# Patient Record
Sex: Male | Born: 1940
Health system: Southern US, Community
[De-identification: ages and names within clinical notes are randomized; demographics above are authoritative.]

## PROBLEM LIST (undated history)

## (undated) DIAGNOSIS — I4891 Unspecified atrial fibrillation: Secondary | ICD-10-CM

## (undated) DIAGNOSIS — E785 Hyperlipidemia, unspecified: Secondary | ICD-10-CM

## (undated) DIAGNOSIS — I509 Heart failure, unspecified: Secondary | ICD-10-CM

## (undated) DIAGNOSIS — I219 Acute myocardial infarction, unspecified: Secondary | ICD-10-CM

## (undated) DIAGNOSIS — E039 Hypothyroidism, unspecified: Secondary | ICD-10-CM

## (undated) DIAGNOSIS — I10 Essential (primary) hypertension: Secondary | ICD-10-CM

## (undated) DIAGNOSIS — I2510 Atherosclerotic heart disease of native coronary artery without angina pectoris: Secondary | ICD-10-CM

## (undated) DIAGNOSIS — C801 Malignant (primary) neoplasm, unspecified: Secondary | ICD-10-CM

## (undated) DIAGNOSIS — E6609 Other obesity due to excess calories: Secondary | ICD-10-CM

## (undated) DIAGNOSIS — I252 Old myocardial infarction: Secondary | ICD-10-CM

## (undated) DIAGNOSIS — R001 Bradycardia, unspecified: Secondary | ICD-10-CM

## (undated) HISTORY — DX: Bradycardia, unspecified: R00.1

## (undated) HISTORY — DX: Hypothyroidism, unspecified: E03.9

## (undated) HISTORY — DX: Atherosclerotic heart disease of native coronary artery without angina pectoris: I25.10

## (undated) HISTORY — DX: Old myocardial infarction: I25.2

## (undated) HISTORY — DX: Heart failure, unspecified: I50.9

## (undated) HISTORY — DX: Other obesity due to excess calories: E66.09

## (undated) HISTORY — DX: Unspecified atrial fibrillation: I48.91

## (undated) HISTORY — DX: Hyperlipidemia, unspecified: E78.5

## (undated) HISTORY — DX: Malignant (primary) neoplasm, unspecified: C80.1

## (undated) HISTORY — PX: ANGIOPLASTY: SHX39

## (undated) HISTORY — DX: Acute myocardial infarction, unspecified: I21.9

## (undated) HISTORY — PX: CARDIAC DEFIBRILLATOR PLACEMENT: SHX171

## (undated) HISTORY — DX: Essential (primary) hypertension: I10

---

## 1993-09-05 DIAGNOSIS — I252 Old myocardial infarction: Secondary | ICD-10-CM

## 1993-09-05 HISTORY — DX: Old myocardial infarction: I25.2

## 1994-03-13 HISTORY — PX: CARDIAC CATHETERIZATION: SHX172

## 2002-06-19 ENCOUNTER — Encounter: Admission: RE | Admit: 2002-06-19 | Discharge: 2002-09-17 | Payer: Self-pay | Admitting: Family Medicine

## 2002-12-03 ENCOUNTER — Encounter: Admission: RE | Admit: 2002-12-03 | Discharge: 2003-03-03 | Payer: Self-pay | Admitting: Family Medicine

## 2004-07-27 ENCOUNTER — Emergency Department (HOSPITAL_COMMUNITY): Admission: EM | Admit: 2004-07-27 | Discharge: 2004-07-27 | Payer: Self-pay | Admitting: Emergency Medicine

## 2004-07-30 ENCOUNTER — Ambulatory Visit (HOSPITAL_COMMUNITY): Admission: RE | Admit: 2004-07-30 | Discharge: 2004-07-30 | Payer: Self-pay | Admitting: Orthopedic Surgery

## 2004-09-17 ENCOUNTER — Observation Stay (HOSPITAL_COMMUNITY): Admission: RE | Admit: 2004-09-17 | Discharge: 2004-09-18 | Payer: Self-pay | Admitting: Orthopedic Surgery

## 2004-09-17 ENCOUNTER — Encounter (INDEPENDENT_AMBULATORY_CARE_PROVIDER_SITE_OTHER): Payer: Self-pay | Admitting: Specialist

## 2004-10-07 HISTORY — PX: US ECHOCARDIOGRAPHY: HXRAD669

## 2008-03-11 HISTORY — PX: CARDIOVASCULAR STRESS TEST: SHX262

## 2008-04-25 ENCOUNTER — Ambulatory Visit: Payer: Self-pay | Admitting: Internal Medicine

## 2008-05-08 ENCOUNTER — Ambulatory Visit: Payer: Self-pay | Admitting: Internal Medicine

## 2008-05-08 LAB — CONVERTED CEMR LAB
BUN: 22 mg/dL (ref 6–23)
Basophils Absolute: 0.1 10*3/uL (ref 0.0–0.1)
Basophils Relative: 0.9 % (ref 0.0–3.0)
CO2: 26 meq/L (ref 19–32)
Calcium: 9.1 mg/dL (ref 8.4–10.5)
Chloride: 101 meq/L (ref 96–112)
Creatinine, Ser: 1.2 mg/dL (ref 0.4–1.5)
Eosinophils Absolute: 0.2 10*3/uL (ref 0.0–0.7)
Eosinophils Relative: 3.6 % (ref 0.0–5.0)
GFR calc Af Amer: 77 mL/min
GFR calc non Af Amer: 64 mL/min
Glucose, Bld: 334 mg/dL — ABNORMAL HIGH (ref 70–99)
HCT: 46 % (ref 39.0–52.0)
Hemoglobin: 15.9 g/dL (ref 13.0–17.0)
INR: 1 (ref 0.8–1.0)
Lymphocytes Relative: 20.2 % (ref 12.0–46.0)
MCHC: 34.6 g/dL (ref 30.0–36.0)
MCV: 102.4 fL — ABNORMAL HIGH (ref 78.0–100.0)
Monocytes Absolute: 0.8 10*3/uL (ref 0.1–1.0)
Monocytes Relative: 13.7 % — ABNORMAL HIGH (ref 3.0–12.0)
Neutro Abs: 3.6 10*3/uL (ref 1.4–7.7)
Neutrophils Relative %: 61.6 % (ref 43.0–77.0)
Platelets: 185 10*3/uL (ref 150–400)
Potassium: 4.7 meq/L (ref 3.5–5.1)
Prothrombin Time: 12.3 s (ref 10.9–13.3)
RBC: 4.49 M/uL (ref 4.22–5.81)
RDW: 13.7 % (ref 11.5–14.6)
Sodium: 136 meq/L (ref 135–145)
WBC: 5.9 10*3/uL (ref 4.5–10.5)
aPTT: 28.5 s (ref 21.7–29.8)

## 2008-05-13 ENCOUNTER — Ambulatory Visit: Payer: Self-pay | Admitting: Internal Medicine

## 2008-05-13 ENCOUNTER — Observation Stay (HOSPITAL_COMMUNITY): Admission: RE | Admit: 2008-05-13 | Discharge: 2008-05-14 | Payer: Self-pay | Admitting: Internal Medicine

## 2008-05-28 ENCOUNTER — Ambulatory Visit: Payer: Self-pay

## 2008-08-19 ENCOUNTER — Ambulatory Visit: Payer: Self-pay | Admitting: Internal Medicine

## 2008-10-15 ENCOUNTER — Encounter: Payer: Self-pay | Admitting: Internal Medicine

## 2008-11-18 ENCOUNTER — Ambulatory Visit: Payer: Self-pay | Admitting: Internal Medicine

## 2008-12-23 ENCOUNTER — Ambulatory Visit: Payer: Self-pay | Admitting: Internal Medicine

## 2009-01-13 ENCOUNTER — Encounter: Payer: Self-pay | Admitting: Internal Medicine

## 2009-01-20 ENCOUNTER — Ambulatory Visit: Payer: Self-pay | Admitting: Internal Medicine

## 2009-02-20 ENCOUNTER — Ambulatory Visit: Payer: Self-pay | Admitting: Internal Medicine

## 2009-05-16 DIAGNOSIS — I495 Sick sinus syndrome: Secondary | ICD-10-CM | POA: Insufficient documentation

## 2009-05-16 DIAGNOSIS — Z95810 Presence of automatic (implantable) cardiac defibrillator: Secondary | ICD-10-CM | POA: Insufficient documentation

## 2009-05-16 DIAGNOSIS — I255 Ischemic cardiomyopathy: Secondary | ICD-10-CM | POA: Insufficient documentation

## 2009-05-16 DIAGNOSIS — I5022 Chronic systolic (congestive) heart failure: Secondary | ICD-10-CM | POA: Insufficient documentation

## 2009-06-19 ENCOUNTER — Encounter: Admission: RE | Admit: 2009-06-19 | Discharge: 2009-06-19 | Payer: Self-pay | Admitting: Family Medicine

## 2009-08-11 ENCOUNTER — Ambulatory Visit: Payer: Self-pay | Admitting: Internal Medicine

## 2009-11-10 ENCOUNTER — Ambulatory Visit: Payer: Self-pay | Admitting: Internal Medicine

## 2009-11-17 ENCOUNTER — Encounter: Payer: Self-pay | Admitting: Internal Medicine

## 2010-02-09 ENCOUNTER — Ambulatory Visit: Payer: Self-pay | Admitting: Internal Medicine

## 2010-02-09 ENCOUNTER — Encounter: Payer: Self-pay | Admitting: Internal Medicine

## 2010-02-23 ENCOUNTER — Encounter: Payer: Self-pay | Admitting: Internal Medicine

## 2010-03-02 ENCOUNTER — Encounter: Payer: Self-pay | Admitting: Internal Medicine

## 2010-04-26 ENCOUNTER — Ambulatory Visit: Payer: Self-pay | Admitting: Cardiology

## 2010-04-26 ENCOUNTER — Ambulatory Visit (HOSPITAL_COMMUNITY): Admission: RE | Admit: 2010-04-26 | Discharge: 2010-04-26 | Payer: Self-pay | Admitting: Cardiology

## 2010-04-26 HISTORY — PX: US ECHOCARDIOGRAPHY: HXRAD669

## 2010-05-13 ENCOUNTER — Ambulatory Visit: Payer: Self-pay | Admitting: Internal Medicine

## 2010-05-24 ENCOUNTER — Encounter: Payer: Self-pay | Admitting: Internal Medicine

## 2010-08-19 ENCOUNTER — Ambulatory Visit: Payer: Self-pay | Admitting: Internal Medicine

## 2010-09-14 ENCOUNTER — Encounter (INDEPENDENT_AMBULATORY_CARE_PROVIDER_SITE_OTHER): Payer: Self-pay | Admitting: *Deleted

## 2010-10-07 NOTE — Cardiovascular Report (Signed)
Summary: Office Visit Remote   Office Visit Remote   Imported By: Sallee Provencal 09/24/2010 10:40:00  _____________________________________________________________________  External Attachment:    Type:   Image     Comment:   External Document

## 2010-10-07 NOTE — Letter (Signed)
Summary: Remote Device Check  [place], [place]  [id number] [place]   [name], [place] [address]   Phone: [phone]  Fax: [phone]     February 23, 2010 MRN: [id number]   [facility] [place], [place]  [address]   Dear Mr. Roehr,   Your remote transmission was recieved and reviewed by your physician.  All diagnostics were within normal limits for you.  __X__Your next transmission is scheduled for:  05-13-2010.  Please transmit at any time this day.  If you have a wireless device your transmission will be sent automatically.   Sincerely,  Shelly Bombard

## 2010-10-07 NOTE — Cardiovascular Report (Signed)
Summary: Office Visit Remote   Office Visit Remote   Imported By: Sallee Provencal 03/05/2010 16:39:54  _____________________________________________________________________  External Attachment:    Type:   Image     Comment:   External Document

## 2010-10-07 NOTE — Letter (Signed)
Summary: Remote Device Check  [facility], [place]  [id number] [place]   [place], [place] [address]   Phone: [phone]  Fax: [phone]     March 02, 2010 MRN: [id number]   [facility] [place], [place]  [address]   Dear Mr. Diiorio,   Your remote transmission was recieved and reviewed by your physician.  All diagnostics were within normal limits for you.  __X___Your next transmission is scheduled for:   05-13-2010.  Please transmit at any time this day.  If you have a wireless device your transmission will be sent automatically.   Sincerely,  Shelly Bombard

## 2010-10-07 NOTE — Cardiovascular Report (Signed)
Summary: Office Visit Remote   Office Visit Remote   Imported By: Sallee Provencal 05/25/2010 12:42:53  _____________________________________________________________________  External Attachment:    Type:   Image     Comment:   External Document

## 2010-10-07 NOTE — Letter (Signed)
Summary: Remote Device Check  [facility], [place]  [id number] [place]   [place], [place] [address]   Phone: [phone]  Fax: [phone]     November 17, 2009 MRN: [id number]   [facility] [place], [place]  [address]   Dear Rodney Lee,   Your remote transmission was recieved and reviewed by your physician.  All diagnostics were within normal limits for you.  __X___Your next transmission is scheduled for:     February 09, 2010.  Please transmit at any time this day.  If you have a wireless device your transmission will be sent automatically.      Sincerely,  Hotel manager

## 2010-10-07 NOTE — Letter (Signed)
Summary: Remote Device Check  Rodney Lee, Rodney Lee  [id number] [place]   [place], [place] [address]   Phone: [phone]  Fax: [phone]     May 24, 2010 MRN: [id number]   [facility] [place], [place]  [address]   Dear [name],   Your remote transmission was recieved and reviewed by your physician.  All diagnostics were within normal limits for you.  __X___Your next transmission is scheduled for: 08-19-2010.  Please transmit at any time this day.  If you have a wireless device your transmission will be sent automatically.   Sincerely,  Shelly Bombard

## 2010-10-07 NOTE — Cardiovascular Report (Signed)
Summary: Office Visit Remote   Office Visit Remote   Imported By: Sallee Provencal 11/18/2009 11:37:44  _____________________________________________________________________  External Attachment:    Type:   Image     Comment:   External Document

## 2010-10-07 NOTE — Letter (Signed)
Summary: Remote Device Check  [facility], [place]  [name] [place]   [place], [place] [address]   Phone: [phone]  Fax: [phone]     September 14, 2010 MRN: [id number]   [facility] [place], [place]  [address]   Dear Mr. Krempasky,   Your remote transmission was recieved and reviewed by your physician.  All diagnostics were within normal limits for you.  __X____Your next office visit is scheduled for: March 2012 with Dr Lovena Le. Please call our office to schedule an appointment.    Sincerely,  Shelly Bombard

## 2011-01-08 ENCOUNTER — Other Ambulatory Visit: Payer: Self-pay | Admitting: Cardiology

## 2011-01-08 DIAGNOSIS — E7800 Pure hypercholesterolemia, unspecified: Secondary | ICD-10-CM

## 2011-01-10 NOTE — Telephone Encounter (Signed)
escribe request  

## 2011-01-18 NOTE — Op Note (Signed)
NAME:  Rodney Lee, [name]:  [id number]   MEDICAL RECORD NO.:  [id number]          PATIENT TYPE:  INP   LOCATION:  2899                         FACILITY:  [facility]   PHYSICIAN:  Thompson Grayer, MD       DATE OF BIRTH:  1941-05-29   DATE OF PROCEDURE:  DATE OF DISCHARGE:                               OPERATIVE REPORT   PRIMARY OPERATOR:  Champ Mungo. Lovena Le, MD.   FIRST ASSISTANT:  Thompson Grayer, MD   PREPROCEDURE DIAGNOSES:  1. Ischemic cardiomyopathy.  2. New York Heart Association class II heart failure.  3. Sinus bradycardia.  4. Coronary artery disease.   POSTPROCEDURE DIAGNOSES:  1. Ischemic cardiomyopathy.  2. New York Heart Association class II heart failure.  3. Sinus bradycardia.  4. Coronary artery disease.   PROCEDURES:  1. Dual-chamber implantable cardioverter-defibrillator implantation.  2. Defibrillation threshold testing.   DESCRIPTION OF THE PROCEDURE:  Informed written consent was obtained and  the patient was brought to the electrophysiology lab in a fasting state.  He was adequately sedated with intravenous midazolam and fentanyl as  outlined in the nursing report.  The patient's left chest was prepped  and draped in the usual sterile fashion by the EP lab staff.  A 6-cm  incision was then made over the left deltopectoral region.  An ICD  pocket was fashioned using a combination of blunt and sharp dissection.  The electrocautery was used to maintain hemostasis throughout.  The left  cephalic vein visualized.  A Medtronic Sprint Quattro Secure model [id number] (serial [id number]) right ventricular defibrillator lead was  advanced through this vein with fluoroscopic guidance into the right  ventricular apex position.  The lead was actually fixed to the  myocardium and secured to the pectoralis fascia with #0 silk suture over  the suture sleeve.  The left axillary vein was then accessed using a  modified Seldinger technique and through this vein,  a Medtronic model  [id number] (serial [id number]) right atrial lead was advanced into the  right atrial appendage position.  The lead was actively fixed in this  location and then secured to the pectoralis fascia with #0 silk over the  suture sleeves.  The leads were then connected to a Medtronic Virtuoso  DR model [id number] (serial [id number]) dual chamber ICD.  Initial lead  measurements revealed an atrial lead P-wave of 3.8 mV with an impedance  of 884 ohms and threshold of 1 volt at 0.5 msec.  The right ventricular  lead R-wave measured 10.4 mV with an impedance of 1000 ohms and  threshold of 1 volt at 0.5 msec.  The ICD pocket was then irrigated with  copious gentamicin solution.  The device was placed into the pocket and  secured to the pectoralis fascia with a single 0 silk suture.  The  pocket was then closed with 2.0 Vicryl suture for the subcutaneous and  subcuticular layers.  Steri-Strips and a sterile bandage were then  applied.  DFT testing was then performed.  Ventricular fibrillation was  induced with 2 shocks.  Adequate ventricular fibrillation sensing  was  observed at 1.2 mV sensitivity.  The patient was successfully  defibrillated with a single 15-joule shock delivered through the device  with an impedance of 49 ohms and a time of 8 seconds.  The procedure was  therefore considered completed.  The patient was returned to his room in  good condition.  There were no early apparent complications.   CONCLUSIONS:  1. Successful Medtronic Virtuoso DR ICD implantation into the left      subcutaneous chest wall pocket.  2. DFT less than or equal to 15 joules.  3. No early apparent complications.      Thompson Grayer, MD  Electronically Signed     JA/MEDQ  D:  05/13/2008  T:  05/14/2008  Job:  [id number]   cc:   Darlin Coco, M.D.

## 2011-01-18 NOTE — Letter (Signed)
April 25, 2008    Darlin Coco, MD  [address] [place], [place] [address]   RE:  JOHNCARLOS, JUNGE  MRN:  [id number]  /  DOB:  1941-03-17   Dear Marcello Moores,   Thank you for referring Mr. Andre Lefort for EP evaluation and  consideration for prophylactic ICD insertion.  As you know, he is a very  pleasant 70 year old man with a history of coronary artery disease,  diabetes, and hypertension, who is status post stenting back in 1995  when he presented with an acute myocardial infarction.  The patient has  done well from a heart failure perspective.  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  He denies chest pain and shortness of  breath and he has never had frank syncope.   PAST MEDICAL HISTORY:  Notable for arthritis with back surgery in the  past.  He has a history of diabetes for approximately 8 years.  He has  hypertension for 5 or 10 years.  He has dyslipidemia.   SOCIAL HISTORY:  The patient is married.  He denies tobacco or ethanol  abuse.  He drinks a very little in the way of caffeinated beverages.   MEDICATIONS:  1. Vytorin 10/40 a day.  2. Carvedilol 6.25 twice daily.  3. Synthroid 0.125 daily.  4. Ramipril 20 a day.  5. Aspirin 81 a day.  6. Metformin 500 two tablets twice daily.  7. Hydrochlorothiazide 12.5 daily.  8. Glyburide 1 mg daily.  9. Multiple vitamins.   FAMILY HISTORY:  Notable for mother died of an MI in her 64s and father  of complications of Alzheimer dementia in his 48s.   REVIEW OF SYSTEMS:  Notable for allergic rhinitis, otherwise negative  except as noted in the HPI.   PHYSICAL EXAMINATION:  GENERAL:  He is a pleasant, well-appearing, 70-  year-old man, in no acute distress.  VITAL SIGNS:  Blood pressure today was 107/55, the pulse was 49 and  regular, the respirations were 18, the weight was 230 pounds.  HEENT:  Normocephalic and atraumatic.   Pupils are equal and round.  Oropharynx is moist.  Sclerae are anicteric.  NECK:  No jugular venous distention.  There are no thyromegaly.  Trachea  is midline.  The carotids are 2+ and symmetric.  LUNGS:  Clear bilaterally to auscultation.  No wheezes, rales, or  rhonchi are present.  There is no increased work of breathing.  CARDIOVASCULAR:  Regular rate and rhythm.  Normal S1 and S2.  There are  no murmurs, rubs, or gallops.  PMI was enlarged and laterally displaced.  ABDOMEN:  Soft, nontender.  There is no organomegaly.  The bowel sounds  are present.  There is no rebound or guarding.  EXTREMITIES:  No cyanosis, clubbing, or edema.  The pulses were 2+ and  symmetric.  NEUROLOGIC:  Alert and oriented x3.  His cranial nerves were intact.  Strength was 5/5 and symmetric.   EKG demonstrates a marked sinus bradycardia with extensive anterior  myocardial infarction.   IMPRESSION:  Tom, Mr. Cullinane has ischemic cardiomyopathy with a paucity  of congestive heart failure symptoms.  His heart failure really is class  I.  He also has diabetes, hypertension, and dyslipidemia.  I have  discussed the treatment options with the patient and recommended  prophylactic implantable cardioverter-defibrillator implantation with  the patient, not because it will make  him feel better, but because of  his risk for sudden cardiac death and clear survival advantage with  implantable cardioverter-defibrillator insertion.  I have, however, also  consulted him that maintaining a commercial driver's license and having  a defibrillator in place are not too compatible and if he decides to  proceed with his defibrillator insertion, would not be allowed to re-  certify for his commercial driver's license.  He is considering his  options.  He wants to think  about whether to have this done and will call us if he would to like to  proceed with ICD implantation.   Tom, once again thanks for referring Mr. Monagle for EP  evaluation.    Sincerely,      Champ Mungo. Lovena Le, MD  Electronically Signed    GWT/MedQ  DD: 04/25/2008  DT: 04/26/2008  Job #: [id number]

## 2011-01-18 NOTE — Assessment & Plan Note (Signed)
[facility] OFFICE NOTE   JEDADIAH, YEHL                        MRN:          [id number]  DATE:08/19/2008                            DOB:          18-Feb-1941    [name] returns today for followup.  He is a very pleasant male with a  history of an ischemic cardiomyopathy, congestive heart failure, ICD  implantation who returns today for followup.  He denies chest pain or  shortness of breath.  He says his defibrillator is placed, he has had no  intercurrent IC therapies.   CURRENT MEDICATIONS:  1. Vytorin 10/40.  2. Carvedilol 6.25 twice a day.  3. Aspirin 81 a day.  4. Metformin 500 twice daily.  5. HCTZ 12.5 daily.  6. Lisinopril 10 daily.  7. Synthroid 0.137 daily.  8. Multivitamin.  9. Vitamin C.  10.HCTZ 12.5 daily.   PHYSICAL EXAMINATION:  GENERAL:  He is a pleasant well-appearing middle-  aged man in no distress.  VITAL SIGNS:  The blood pressure today was 138/77, the pulse 60 and  regular, respirations were 18.  Weight was 232 pounds.  NECK:  No jugular venous distention.  LUNGS:  Clear bilaterally to auscultation.  No wheezes, rales, or  rhonchi are present.  No increased work of breathing.  CARDIOVASCULAR:  Regular rate and rhythm.  Normal S1 and S2.  ABDOMINAL:  Soft, nontender.  EXTREMITIES:  No edema.   Interrogation of his defibrillator demonstrates [place].  The  P-waves were 6, the R-waves were 10, the impedance 536 in the A and 648  in the RV, the threshold of 0.5 and 0.4 in the atrium and 1 and 0.4 in  the RV.  Battery voltage was 3.2 volts.  There are no intercurrent ICD  therapies.  He was 45% A paced.   IMPRESSION:  1. Ischemic cardiomyopathy.  2. Congestive heart failure.  3. Sinus bradycardia.  4. Status post dual-chamber ICD insertion.   DISCUSSION:  Overall, Mr. Braude is stable and his defibrillator is  working normally.  He is pacing 45% of the time  secondary to some sinus  node dysfunction on beta-blockers.  I will plan  to see the patient back for ICD followup in 9 months.  I have encouraged  him to maintain a low-sodium diet.     Champ Mungo. Lovena Le, MD  Electronically Signed    GWT/MedQ  DD: 08/19/2008  DT: 08/19/2008  Job #: [id number]   cc:   Darlin Coco, M.D.

## 2011-01-18 NOTE — Discharge Summary (Signed)
NAME:  Rodney Lee, Rodney Lee NO.:  [id number]   MEDICAL RECORD NO.:  [id number]          PATIENT TYPE:  INP   LOCATION:  2035                         FACILITY:  [facility]   PHYSICIAN:  Rodney Mungo. Lovena Le, MD    DATE OF BIRTH:  [birth year]   DATE OF ADMISSION:  05/13/2008  DATE OF DISCHARGE:  05/14/2008                               DISCHARGE SUMMARY   This patient has no known drug allergies and time for this dictation and  examination greater than 30 minutes.   FINAL DIAGNOSIS:  Discharged on day 1, status post implantation of the  Medtronic VIRTUOSO DR dual-chamber cardioverter defibrillator, Rodney Lee.  Defibrillator threshold showing less than or equal to 15  joules.   SECONDARY DIAGNOSES:  1. History of coronary artery disease, stent 1995 at presentation with      acute myocardial infarction.  2. New York Heart Association Class II chronic systolic congestive      heart failure.  3. Hypertension.  4. Diabetes.  5. Dyslipidemia.  6. Recent Myoview study, March 11, 2008.  Ejection fraction 30%,      anteroseptal and apical akinesis with finding of old anteroseptal      and apical scar, no ischemia.   PROCEDURES:  On May 13, 2008, implantation of a dual-chamber  cardioverter defibrillator as mentioned above, Rodney Lee.  The  patient had no postprocedural complications.  No hematoma at the  insertion site.  Chest x-ray shows that the leads are in the appropriate  position and device has been interrogated and all values are within  normal limits.   BRIEF HISTORY:  Rodney Lee is a 70 year old male who is referred by Dr.  Mare Lee for evaluation of cardioverter defibrillator for prophylactic  prevention.   He is a 70 year old male with a history of coronary artery disease,  diabetes, and hypertension.  The patient has done well from a heart  failure perspective.  He is very active, still working part time.  His  New York Heart Association Class is II.   He does not have shortness of  breath or chest pain.  He has never had frank syncope.  The patient had  a recent Myoview study which showed his ejection fraction had not  significantly improved, it was still 30% despite Carvedilol therapy and  ACE-I therapy.  The patient presents for a consideration of cardioverter  defibrillator.   HOSPITAL COURSE:  The patient had the risks and benefits of cardioverter  defibrillator implantation described and wished to proceed.  He is  admitted to [facility] electively on May 13, 2008.  He  underwent implantation of the device the same day.  He is discharging  postprocedure day #1 with no postprocedural complications.  He is asked  to keep his incision dry for the next 7 days and to sponge bathe until  Tuesday, May 20, 2008.  He is asked not to drive for 1 week.  Mobility of the left arm has been discussed with the patient.   MEDICATION AT DISCHARGE:  1. Vytorin 10/20 daily at bedtime.  2. Carvedilol 6.25 mg twice daily.  3. Synthroid 125 mcg daily.  4. Ramipril 20 mg daily.  5. Enteric-coated aspirin 81 mg daily.  6. Metformin 500 mg 2 tablets in the morning and 2 tablets in the      evening.  7. Hydrochlorothiazide 12.5 mg daily.  8. Glimepiride 1 mg daily.  9. Multivitamin daily.   FOLLOWUP:  1. [facility] at 11:26, [employer].  2. [facility], Wednesday, May 28, 2008, at 3:30.  3. He sees Rodney Lee, Tuesday, August 19, 2008, at 9:30.   LABORATORY STUDIES:  Pertinent to this admission withdrawn on May 08, 2008, protime 12.3 and INR is 1.  White cells 5.9, hemoglobin 15.9,  hematocrit 46, and platelets are 185.  Sodium is 136, potassium is 4.7,  chloride 101, carbonate 26, glucose 334, BUN is 22, and creatinine 1.2.      Rodney Margarita, PA      [facility] Lovena Le, MD  Electronically Signed    GM/MEDQ  D:  05/14/2008  T:  05/14/2008  Job:  [id number]   cc:   Rodney Lee,  M.D.  Rodney Lee, M.D.

## 2011-01-21 NOTE — Op Note (Signed)
NAME:  Rodney Lee, Rodney Lee NO.:  [id number]   MEDICAL RECORD NO.:  [id number]          PATIENT TYPE:  AMB   LOCATION:  DAY                          [facility]ACILITY[facility]  [facility]   PHYSICIAN:  Tarri Glenn, M.D.  DATE OF BIRTH:  1941-02-11   DATE OF PROCEDURE:  09/17/2004  DATE OF DISCHARGE:                                 OPERATIVE REPORT   PREOPERATIVE DIAGNOSIS:  Herniated nucleus pulposus at L3-4, left.   POSTOPERATIVE DIAGNOSIS:  Herniated nucleus pulposus at L3-4, left.   OPERATIONS:  Microdiskectomy and foraminotomy of L3-4, left.   SURGEON:  Tarri Glenn, M.D.   ASSISTANT:  Kipp Brood. Gladstone Lighter, M.D.   ANESTHESIA:  General.   JUSTIFICATION FOR PROCEDURE:  He was having severe back and left leg and  thigh pain with numbness in the left thigh.  MRIs demonstrated abnormalities  at multiple levels with the major problem, however, being a foraminal and  extraforaminal disk herniation at L3-4 on the left, which appeared to be  abutting on both the L3-4 and nerve roots.  Because of failure to respond to  nonsurgical treatment, he is here today for surgical intervention.   DESCRIPTION OF PROCEDURE:  Prophylactic antibiotics.  Satisfactory general  anesthesia.  Knee/chest position on the [place] frame.  The back was prepped  with Duraprep.  With three spinal x-rays and a lateral x-ray, I tentatively  located the L3-4 interspace.  We then continued draping the back as a  sterile field.  Based on the initial x-ray, I made a surgical incision and  exposed the two spinous processes, which I anticipate would be L3 and L4.  I  clamped these with Kocher clamps and took a second lateral x-ray indicating  that these were L3 and L4 spinous processes with the L3-4 disk slightly bias  to the superior marker.  Based on this, I dissected soft tissue off of the  lamina of L3 and L4 and placed a self-retaining retractor McCollough  retractor.  With a combination of curets, I dissected  soft tissue off of the  underneath surface of the lamina of L3 and superior of L4 and was able to  get beneath both with 2 mm Kerrison and alternating between 3 mm Kerrison to  remove bone and ligamentum flavum, performing wide foraminotomy for the L4  nerve root and removing enough of the lamina of L3 that we could get to the  interspace.  Also did a wide decompression lateral.  I then brought in the  microscope and completed the decompression and dissection, locating the L4  nerve root and mobilizing it medially.  He had a fairly large venous plexus  adjacent to the disk which I coagulated with bipolar cautery.  The  interspace was open with a 15 knife blade and a large amount of disk  material removed with a combination of Epstein curets and pituitaries with  particular attention to work laterally.  A good bit of disk material was  obtained from the lateral aspect of the interspace.  With a hockey stick I  was then able to place it  in the foramen of the L3 nerve root and found it  to be widely patent as was the foramen for the L4 nerve root.  We then made  sure that all bleeding was corrected with Gelfoam before we closed.  Self-  retaining retractors were removed.  There was no unusual bleeding.  He was  given 300 mg of Toradol IV.  We closed the fascia with interrupted 1-0  Vicryl leaving a little aperture superiorly for any oozing.  We closed  the subcutaneous tissue with 2-0 Vicryl and the skin with staples.  We  infiltrated the subcutaneous tissue with 0.50% plain Marcaine.  Betadine and  Adaptic dressing were applied.  He was gently placed on his bed and taken to  recovery in satisfactory condition with no known complications.      JA/MEDQ  D:  09/17/2004  T:  09/17/2004  Job:  [id number]

## 2011-04-03 ENCOUNTER — Other Ambulatory Visit: Payer: Self-pay | Admitting: Cardiology

## 2011-04-04 NOTE — Telephone Encounter (Signed)
escribe medication per fax request  

## 2011-05-12 ENCOUNTER — Encounter: Payer: Self-pay | Admitting: *Deleted

## 2011-05-23 ENCOUNTER — Other Ambulatory Visit: Payer: Self-pay | Admitting: Internal Medicine

## 2011-05-23 ENCOUNTER — Encounter: Payer: Self-pay | Admitting: Internal Medicine

## 2011-05-23 ENCOUNTER — Ambulatory Visit (INDEPENDENT_AMBULATORY_CARE_PROVIDER_SITE_OTHER): Payer: Self-pay | Admitting: *Deleted

## 2011-05-23 DIAGNOSIS — I428 Other cardiomyopathies: Secondary | ICD-10-CM

## 2011-05-23 DIAGNOSIS — I495 Sick sinus syndrome: Secondary | ICD-10-CM

## 2011-05-23 DIAGNOSIS — I509 Heart failure, unspecified: Secondary | ICD-10-CM

## 2011-05-28 LAB — REMOTE ICD DEVICE
AL AMPLITUDE: 4.8778 mv
AL IMPEDENCE ICD: 424 Ohm
ATRIAL PACING ICD: 66.34 pct
BAMS-0001: 170 {beats}/min
BATTERY VOLTAGE: 3.06 V
CHARGE TIME: 10.169 s
DEV-0020ICD: NEGATIVE
FVT: 0
PACEART VT: 0
RV LEAD AMPLITUDE: 10.3763 mv
RV LEAD IMPEDENCE ICD: 760 Ohm
TOT-0001: 1
TOT-0002: 0
TOT-0006: 20090908000000
TZAT-0001ATACH: 1
TZAT-0001ATACH: 2
TZAT-0001ATACH: 3
TZAT-0001FASTVT: 1
TZAT-0001SLOWVT: 1
TZAT-0001SLOWVT: 2
TZAT-0002ATACH: NEGATIVE
TZAT-0002ATACH: NEGATIVE
TZAT-0002ATACH: NEGATIVE
TZAT-0002FASTVT: NEGATIVE
TZAT-0002SLOWVT: NEGATIVE
TZAT-0002SLOWVT: NEGATIVE
TZAT-0012ATACH: 150 ms
TZAT-0012ATACH: 150 ms
TZAT-0012ATACH: 150 ms
TZAT-0012FASTVT: 200 ms
TZAT-0012SLOWVT: 200 ms
TZAT-0012SLOWVT: 200 ms
TZAT-0018ATACH: NEGATIVE
TZAT-0018ATACH: NEGATIVE
TZAT-0018ATACH: NEGATIVE
TZAT-0018FASTVT: NEGATIVE
TZAT-0018SLOWVT: NEGATIVE
TZAT-0018SLOWVT: NEGATIVE
TZAT-0019ATACH: 6 V
TZAT-0019ATACH: 6 V
TZAT-0019ATACH: 6 V
TZAT-0019FASTVT: 8 V
TZAT-0019SLOWVT: 8 V
TZAT-0019SLOWVT: 8 V
TZAT-0020ATACH: 1.5 ms
TZAT-0020ATACH: 1.5 ms
TZAT-0020ATACH: 1.5 ms
TZAT-0020FASTVT: 1.5 ms
TZAT-0020SLOWVT: 1.5 ms
TZAT-0020SLOWVT: 1.5 ms
TZON-0003ATACH: 350 ms
TZON-0003SLOWVT: 350 ms
TZON-0003VSLOWVT: 390 ms
TZON-0004SLOWVT: 16
TZON-0004VSLOWVT: 20
TZON-0005SLOWVT: 12
TZST-0001ATACH: 4
TZST-0001ATACH: 5
TZST-0001ATACH: 6
TZST-0001FASTVT: 2
TZST-0001FASTVT: 3
TZST-0001FASTVT: 4
TZST-0001FASTVT: 5
TZST-0001FASTVT: 6
TZST-0001SLOWVT: 3
TZST-0001SLOWVT: 4
TZST-0001SLOWVT: 5
TZST-0001SLOWVT: 6
TZST-0002ATACH: NEGATIVE
TZST-0002ATACH: NEGATIVE
TZST-0002ATACH: NEGATIVE
TZST-0002FASTVT: NEGATIVE
TZST-0002FASTVT: NEGATIVE
TZST-0002FASTVT: NEGATIVE
TZST-0002FASTVT: NEGATIVE
TZST-0002FASTVT: NEGATIVE
TZST-0002SLOWVT: NEGATIVE
TZST-0002SLOWVT: NEGATIVE
TZST-0002SLOWVT: NEGATIVE
TZST-0002SLOWVT: NEGATIVE
VENTRICULAR PACING ICD: 0.05 pct
VF: 0

## 2011-06-03 ENCOUNTER — Encounter: Payer: Self-pay | Admitting: Cardiology

## 2011-06-03 NOTE — Progress Notes (Signed)
ICD/ICM checked by remote

## 2011-06-08 ENCOUNTER — Encounter: Payer: Self-pay | Admitting: *Deleted

## 2011-06-08 LAB — GLUCOSE, CAPILLARY
Glucose-Capillary: 158 — ABNORMAL HIGH
Glucose-Capillary: 204 — ABNORMAL HIGH
Glucose-Capillary: 219 — ABNORMAL HIGH
Glucose-Capillary: 239 — ABNORMAL HIGH
Glucose-Capillary: 244 — ABNORMAL HIGH

## 2011-06-08 LAB — CBC
HCT: 47
Hemoglobin: 15.8
MCHC: 33.6
MCV: 101.5 — ABNORMAL HIGH
Platelets: 183
RBC: 4.63
RDW: 14.3
WBC: 5.6

## 2011-06-08 LAB — BASIC METABOLIC PANEL
BUN: 11
CO2: 27
Calcium: 9.2
Chloride: 104
Creatinine, Ser: 1.07
GFR calc Af Amer: >60
GFR calc non Af Amer: >60
Glucose, Bld: 223 — ABNORMAL HIGH
Potassium: 4.4
Sodium: 138

## 2011-06-08 LAB — PROTIME-INR
INR: 1
Prothrombin Time: 13.6

## 2011-06-08 LAB — APTT: aPTT: 27

## 2011-06-20 ENCOUNTER — Encounter: Payer: Self-pay | Admitting: Cardiology

## 2011-06-20 ENCOUNTER — Ambulatory Visit (INDEPENDENT_AMBULATORY_CARE_PROVIDER_SITE_OTHER): Payer: Medicare Other | Admitting: Cardiology

## 2011-06-20 VITALS — BP 120/70 | HR 60 | Ht 71.0 in | Wt 227.0 lb

## 2011-06-20 DIAGNOSIS — E7800 Pure hypercholesterolemia, unspecified: Secondary | ICD-10-CM

## 2011-06-20 DIAGNOSIS — E785 Hyperlipidemia, unspecified: Secondary | ICD-10-CM

## 2011-06-20 DIAGNOSIS — E119 Type 2 diabetes mellitus without complications: Secondary | ICD-10-CM

## 2011-06-20 DIAGNOSIS — I259 Chronic ischemic heart disease, unspecified: Secondary | ICD-10-CM

## 2011-06-20 DIAGNOSIS — I509 Heart failure, unspecified: Secondary | ICD-10-CM

## 2011-06-20 LAB — LIPID PANEL: Total CHOL/HDL Ratio: 7

## 2011-06-20 LAB — BASIC METABOLIC PANEL
CO2: 27 mEq/L (ref 19–32)
Calcium: 9.2 mg/dL (ref 8.4–10.5)
Creatinine, Ser: 1.3 mg/dL (ref 0.4–1.5)
Glucose, Bld: 204 mg/dL — ABNORMAL HIGH (ref 70–99)
Sodium: 139 mEq/L (ref 135–145)

## 2011-06-20 LAB — HEPATIC FUNCTION PANEL
AST: 34 U/L (ref 0–37)
Alkaline Phosphatase: 62 U/L (ref 39–117)
Total Bilirubin: 0.4 mg/dL (ref 0.3–1.2)

## 2011-06-20 NOTE — Patient Instructions (Signed)
Will go to lab today and will call you with the results.  continue same dose of medications  Your physician wants you to follow-up in: 6 months You will receive a reminder letter in the mail two months in advance. If you don't receive a letter, please call our office to schedule the follow-up appointment.

## 2011-06-20 NOTE — Assessment & Plan Note (Signed)
The patient has a past history of dyslipidemia.  Also, has diabetes mellitus, and exogenous obesity.  He is not on a careful diet.  His weight is down 1 pound since last visit.  He is no longer exercising regularly because of pain, which develops in his right hip after he walks about 15 minutes.  We talked to him about doing some other form of exercise.  He could join the [facility] for free because his insurance would pay for the monthly membership.  We'll look into that

## 2011-06-20 NOTE — Assessment & Plan Note (Signed)
The patient has not had any symptoms of congestive heart failure.  He denies orthopnea, or paroxysmal nocturnal dyspnea.  He is on carvedilol and ACE inhibitor

## 2011-06-20 NOTE — Assessment & Plan Note (Signed)
The patient has had no recurrence of his angina pectoris.  His last nuclear stress test was in 2009.

## 2011-06-20 NOTE — Progress Notes (Signed)
Rodney Lee Date of Birth:  June 04, 1941 [facility] [id number] [address]. [address]   [place], [place]  [address] [phone]           Fax   [phone]  History of Present Illness: This pleasant 70 year old gentleman is seen for a scheduled followup office visit.  He has a history of ischemic heart disease.  He has had a prior anterolateral myocardial infarction.  He has a defibrillator placed prophylactically because of low ejection fraction.  He has not had any discharges from his defibrillator.  The patient has a history of exogenous obesity, and a history of diabetes mellitus.  He had a difficult time with weight loss.  He has had a problem with some decreased memory, but a trial of Aricept in the past was not helpful.  Current Outpatient Prescriptions  Medication Sig Dispense Refill  . aspirin 81 MG tablet Take 81 mg by mouth daily.        . carvedilol (COREG) 6.25 MG tablet TAKE 1 TABLET TWICE A DAY WITH MEALS  180 tablet  3  . fexofenadine (ALLEGRA) 180 MG tablet Take 180 mg by mouth as needed.        Marland Kitchen glimepiride (AMARYL) 2 MG tablet Take 4 mg by mouth daily before breakfast.        . hydrochlorothiazide (,MICROZIDE/HYDRODIURIL,) 12.5 MG capsule TAKE 1 CAPSULE EVERY OTHER DAY OR AS DIRECTED  90 capsule  3  . levothyroxine (SYNTHROID, LEVOTHROID) 137 MCG tablet Take 137 mcg by mouth daily.        Marland Kitchen lisinopril (PRINIVIL,ZESTRIL) 5 MG tablet TAKE 1 TABLET DAILY  90 tablet  3  . metFORMIN (GLUCOPHAGE) 500 MG tablet Take 1,000 mg by mouth 2 (two) times daily with a meal.        . Multiple Vitamin (MULTIVITAMIN) tablet Take 1 tablet by mouth daily.        . simvastatin (ZOCOR) 80 MG tablet TAKE 1 TABLET DAILY  90 tablet  3  . sitaGLIPtin (JANUVIA) 100 MG tablet Take 100 mg by mouth daily.        . Tamsulosin HCl (FLOMAX) 0.4 MG CAPS Take 0.4 mg by mouth daily.        . vitamin C (ASCORBIC ACID) 500 MG tablet Take 500 mg by mouth daily.          No Known  Allergies  Patient Active Problem List  Diagnoses  . CARDIOMYOPATHY, ISCHEMIC  . SINUS BRADYCARDIA  . CHF  . IMPLANTATION OF DEFIBRILLATOR, HX OF  . Chronic ischemic heart disease, unspecified    History  Smoking status  . Never Smoker   Smokeless tobacco  . Not on file    History  Alcohol Use No    Family History  Problem Relation Age of Onset  . Heart disease Mother   . Heart attack Mother     Review of Systems: Constitutional: no fever chills diaphoresis or fatigue or change in weight.  Head and neck: no hearing loss, no epistaxis, no photophobia or visual disturbance. Respiratory: No cough, shortness of breath or wheezing. Cardiovascular: No chest pain peripheral edema, palpitations. Gastrointestinal: No abdominal distention, no abdominal pain, no change in bowel habits hematochezia or melena. Genitourinary: No dysuria, no frequency, no urgency, no nocturia. Musculoskeletal:No arthralgias, no back pain, no gait disturbance or myalgias. Neurological: No dizziness, no headaches, no numbness, no seizures, no syncope, no weakness, no tremors. Hematologic: No lymphadenopathy, no easy bruising. Psychiatric: No confusion,  no hallucinations, no sleep disturbance.    Physical Exam: Filed Vitals:   06/20/11 1005  BP: 120/70  Pulse: 60   Gen. appearance reveals a well-developed, well-nourished, gentleman in no acute distress.  Pupils equal and reactive.   Extraocular Movements are full.  There is no scleral icterus.  The mouth and pharynx are normal.  The neck is supple.  The carotids reveal no bruits.  The jugular venous pressure is normal.  The thyroid is not enlarged.  There is no lymphadenopathy.  The chest is clear to percussion and auscultation. There are no rales or rhonchi. Expansion of the chest is symmetrical.  The precordium is quiet.  The first heart sound is normal.  The second heart sound is physiologically split.  There is no murmur gallop rub or click.   There is no abnormal lift or heave.  The abdomen is soft and nontender. Bowel sounds are normal. The liver and spleen are not enlarged. There Are no abdominal masses. There are no bruits.  The pedal pulses are good.  There is no phlebitis or edema.  There is no cyanosis or clubbing. Strength is normal and symmetrical in all extremities.  There is no lateralizing weakness.  There are no sensory deficits.  The skin is warm and dry.  There is no rash.   Assessment / Plan: Patient needs to work harder on weight loss and strict diet.  Blood work pending.  He is going to investigate joining the [employer] where he can do exercises other than walking since walking bothers his hip

## 2011-06-23 ENCOUNTER — Telehealth: Payer: Self-pay | Admitting: *Deleted

## 2011-07-07 ENCOUNTER — Telehealth: Payer: Self-pay | Admitting: *Deleted

## 2011-07-07 MED ORDER — ROSUVASTATIN CALCIUM 20 MG PO TABS: 20.0000 mg | ORAL_TABLET | Freq: Every day | ORAL | Status: DC

## 2011-07-07 NOTE — Telephone Encounter (Signed)
Notified of lab results. States he doesn't want to take Lipitor because he thinks it caused some memory loss. Advised to stop Simvastatin. Will send Rx in for Crestor 20 mg to [facility]. Advised if he has any side effects to call us back. Also advised to watch diet more closely.

## 2011-07-07 NOTE — Telephone Encounter (Signed)
Message copied by Hetty Blend on Thu Jul 07, 2011  5:31 PM ------      Message from: Darlin Coco      Created: Sun Jul 03, 2011  4:28 PM       Let us try Crestor 20 mg 1 daily

## 2011-07-11 ENCOUNTER — Telehealth: Payer: Self-pay | Admitting: Cardiology

## 2011-07-11 NOTE — Telephone Encounter (Signed)
New message Pt had some questions about crestor  Please call him back

## 2011-07-11 NOTE — Telephone Encounter (Signed)
Samples of Crestor 20 mg lot [id number] exp. 06/15,  Medication very expensive and asked for samples. Advised patient.

## 2011-08-09 ENCOUNTER — Telehealth: Payer: Self-pay | Admitting: Cardiology

## 2011-08-09 NOTE — Telephone Encounter (Signed)
New problems Pt wants samples of crestor please call

## 2011-08-09 NOTE — Telephone Encounter (Signed)
Samples gathered for patient.  crestor 20 mg #21 lot [id number] exp 08/15 Advised patient

## 2011-08-10 NOTE — Telephone Encounter (Signed)
Opened in error

## 2011-09-01 ENCOUNTER — Telehealth: Payer: Self-pay | Admitting: Cardiology

## 2011-09-01 NOTE — Telephone Encounter (Signed)
New message   Pt wants samples of Crestor 20 mg.  Please call and let him know if there are any he can pick up

## 2011-09-01 NOTE — Telephone Encounter (Signed)
Advised patient.  # 21 lot #[id number] exp 06/15

## 2011-09-26 ENCOUNTER — Telehealth: Payer: Self-pay | Admitting: Cardiology

## 2011-09-26 NOTE — Telephone Encounter (Signed)
New msg Pt wants samples of crestor please call if have any

## 2011-09-26 NOTE — Telephone Encounter (Signed)
crestor 20 mg lot # [id number] exp 08/15 # 21. Advised ptient

## 2011-10-04 ENCOUNTER — Encounter: Payer: Self-pay | Admitting: Internal Medicine

## 2011-10-10 ENCOUNTER — Ambulatory Visit (INDEPENDENT_AMBULATORY_CARE_PROVIDER_SITE_OTHER): Payer: Medicare Other | Admitting: Internal Medicine

## 2011-10-10 ENCOUNTER — Encounter: Payer: Self-pay | Admitting: Internal Medicine

## 2011-10-10 DIAGNOSIS — I509 Heart failure, unspecified: Secondary | ICD-10-CM

## 2011-10-10 DIAGNOSIS — I2589 Other forms of chronic ischemic heart disease: Secondary | ICD-10-CM

## 2011-10-10 DIAGNOSIS — I495 Sick sinus syndrome: Secondary | ICD-10-CM

## 2011-10-10 DIAGNOSIS — Z95810 Presence of automatic (implantable) cardiac defibrillator: Secondary | ICD-10-CM

## 2011-10-10 LAB — ICD DEVICE OBSERVATION
AL AMPLITUDE: 5.3 mv
ATRIAL PACING ICD: 58.83 pct
BAMS-0001: 170 {beats}/min
FVT: 0
RV LEAD IMPEDENCE ICD: 848 Ohm
TOT-0001: 1
TZAT-0001ATACH: 1
TZAT-0001ATACH: 2
TZAT-0001FASTVT: 1
TZAT-0001SLOWVT: 2
TZAT-0002ATACH: NEGATIVE
TZAT-0002ATACH: NEGATIVE
TZAT-0002ATACH: NEGATIVE
TZAT-0012ATACH: 150 ms
TZAT-0012FASTVT: 200 ms
TZAT-0012SLOWVT: 200 ms
TZAT-0018ATACH: NEGATIVE
TZAT-0018ATACH: NEGATIVE
TZAT-0018ATACH: NEGATIVE
TZAT-0019ATACH: 6 V
TZAT-0019ATACH: 6 V
TZAT-0019SLOWVT: 8 V
TZAT-0020ATACH: 1.5 ms
TZAT-0020SLOWVT: 1.5 ms
TZON-0003ATACH: 350 ms
TZON-0003SLOWVT: 350 ms
TZON-0004SLOWVT: 16
TZON-0004VSLOWVT: 20
TZON-0005SLOWVT: 12
TZST-0001ATACH: 4
TZST-0001ATACH: 5
TZST-0001FASTVT: 2
TZST-0001FASTVT: 3
TZST-0001FASTVT: 4
TZST-0001SLOWVT: 3
TZST-0001SLOWVT: 4
TZST-0001SLOWVT: 5
TZST-0001SLOWVT: 6
TZST-0002ATACH: NEGATIVE
TZST-0002ATACH: NEGATIVE
TZST-0002FASTVT: NEGATIVE
TZST-0002FASTVT: NEGATIVE
TZST-0002SLOWVT: NEGATIVE
TZST-0002SLOWVT: NEGATIVE
VENTRICULAR PACING ICD: 0.08 pct

## 2011-10-10 NOTE — Progress Notes (Signed)
HPI Rodney Lee returns today for followup. He is a very pleasant 71 year old man with an ischemic cardiomyopathy, chronic systolic heart failure, diabetes, and hypertension. He is told with weight gain. The patient denies chest pain, shortness of breath, and peripheral edema. Recently, he had his dose of Lasix reduced by his primary cardiologist. He remains active. No syncope or recent ICD shock. Allergies  Allergen Reactions  . Lipitor (Atorvastatin Calcium)     Memory problems     Current Outpatient Prescriptions  Medication Sig Dispense Refill  . aspirin 81 MG tablet Take 81 mg by mouth daily.        . carvedilol (COREG) 6.25 MG tablet TAKE 1 TABLET TWICE A DAY WITH MEALS  180 tablet  3  . glimepiride (AMARYL) 2 MG tablet Take 4 mg by mouth daily before breakfast.        . hydrochlorothiazide (,MICROZIDE/HYDRODIURIL,) 12.5 MG capsule TAKE 1 CAPSULE EVERY OTHER DAY OR AS DIRECTED  90 capsule  3  . levothyroxine (SYNTHROID, LEVOTHROID) 137 MCG tablet Take 137 mcg by mouth daily.        Marland Kitchen lisinopril (PRINIVIL,ZESTRIL) 5 MG tablet TAKE 1 TABLET DAILY  90 tablet  3  . metFORMIN (GLUCOPHAGE) 500 MG tablet Take 1,000 mg by mouth 2 (two) times daily with a meal.        . Multiple Vitamin (MULTIVITAMIN) tablet Take 1 tablet by mouth daily.        . rosuvastatin (CRESTOR) 20 MG tablet Take 1 tablet (20 mg total) by mouth daily.  30 tablet  11  . sitaGLIPtin (JANUVIA) 100 MG tablet Take 100 mg by mouth daily.        . Tamsulosin HCl (FLOMAX) 0.4 MG CAPS Take 0.4 mg by mouth daily.        . vitamin C (ASCORBIC ACID) 500 MG tablet Take 500 mg by mouth daily.           Past Medical History  Diagnosis Date  . IHD (ischemic heart disease)   . Diabetes mellitus   . Hypertension   . Hyperlipidemia   . Hypothyroidism   . Exogenous obesity   . MI, old     ANTEROLATERAL  . CHF (congestive heart failure)   . Sinus bradycardia   . CAD (coronary artery disease)   . MI (myocardial infarction)      ROS:   All systems reviewed and negative except as noted in the HPI.   Past Surgical History  Procedure Date  . Cardiac catheterization 03/13/1994    ACUTE MI WITH TOTAL OCCLUSION OF MID LAD. REPERFUSION AFTER CROSSING WITH A GUIDEWIRE. POOR RIGHT TO LEFT COLLATERAL FLOW  . Cardiac defibrillator placement   . US echocardiography 04/26/2010    EF 35-40%. MODERATE LVH WITH MODERATE GLOBAL LV SYSTOLIC DYSFUNCTION AND IMPAIRED RELAXATION. MILD AORTIC SCLEROSIS WITHOUT STENOSIS. NORMAL PULMONARY ARTERY PRESSURE.  Marland Kitchen US echocardiography 10/07/2004    EF 30-35%  . Cardiovascular stress test 03/11/2008    EF 30%. NO REVERSIBLE ISCHEMIA  . Angioplasty      Family History  Problem Relation Age of Onset  . Heart disease Mother   . Heart attack Mother      History   Social History  . Marital Status: Married    Spouse Name: N/A    Number of Children: N/A  . Years of Education: N/A   Occupational History  . Not on file.   Social History Main Topics  . Smoking status: Never Smoker   .  Smokeless tobacco: Not on file  . Alcohol Use: No  . Drug Use: No  . Sexually Active:    Other Topics Concern  . Not on file   Social History Narrative  . No narrative on file     BP 114/76  Pulse 59  Ht 5\' 11"  (1.803 m)  Wt 102.967 kg (227 lb)  BMI 31.66 kg/m2  Physical Exam:  Well appearing 71 year old man, NAD HEENT: Unremarkable Neck:  No JVD, no thyromegally Lungs:  Clear with no wheezes, rales, or rhonchi. Well-healed ICD incision. HEART:  Regular rate rhythm, no murmurs, no rubs, no clicks Abd:  Obese, soft, positive bowel sounds, no organomegally, no rebound, no guarding Ext:  2 plus pulses, no edema, no cyanosis, no clubbing Skin:  No rashes no nodules Neuro:  CN II through XII intact, motor grossly intact  DEVICE  Normal device function.  See PaceArt for details.   Assess/Plan:

## 2011-10-10 NOTE — Assessment & Plan Note (Signed)
He denies anginal symptoms. He remains active. I've asked the patient try to lose 20 pounds.

## 2011-10-10 NOTE — Assessment & Plan Note (Signed)
His device is working normally. We'll plan to recheck in several months. 

## 2011-10-10 NOTE — Assessment & Plan Note (Signed)
His symptoms are currently class II. He will continue his current medical therapy and maintain a low-sodium diet.

## 2011-10-10 NOTE — Patient Instructions (Signed)
Your physician wants you to follow-up in: 12 months with Dr Knox Saliva will receive a reminder letter in the mail two months in advance. If you don't receive a letter, please call our office to schedule the follow-up appointment.   Remote monitoring is used to monitor your Pacemaker of ICD from home. This monitoring reduces the number of office visits required to check your device to one time per year. It allows [place] to keep an eye on the functioning of your device to ensure it is working properly. You are scheduled for a device check from home on 01/12/12. You may send your transmission at any time that day. If you have a wireless device, the transmission will be sent automatically. After your physician reviews your transmission, you will receive a postcard with your next transmission date.

## 2011-10-14 ENCOUNTER — Telehealth: Payer: Self-pay | Admitting: Cardiology

## 2011-10-14 NOTE — Telephone Encounter (Signed)
#  2 lot @ [id number] exo 06/15 Advised patient

## 2011-10-14 NOTE — Telephone Encounter (Signed)
Pt needs samples of crestor 20mg 

## 2011-11-01 ENCOUNTER — Telehealth: Payer: Self-pay | Admitting: Cardiology

## 2011-11-01 NOTE — Telephone Encounter (Signed)
New Problem   Patient would like a 2 week supply of crestor samples, he can be reached at hm# please return call

## 2011-11-01 NOTE — Telephone Encounter (Signed)
Samples gathered for patient [id number] exp 09/15 #28--crestor 10 mg advised to take 2 a day

## 2011-11-17 ENCOUNTER — Telehealth: Payer: Self-pay | Admitting: Cardiology

## 2011-11-17 NOTE — Telephone Encounter (Signed)
Please return call to patient [phone]  Patient is calling for Crestor samples, please return call on mobile# [phone]

## 2011-11-17 NOTE — Telephone Encounter (Signed)
Advised samples at front destk  [id number] exp 10/15 #28

## 2011-12-15 ENCOUNTER — Telehealth: Payer: Self-pay | Admitting: Cardiology

## 2011-12-15 NOTE — Telephone Encounter (Signed)
Advised patient no samples at this time.  When spoke with him he was asking if someone called him.  Stated on Sunday at church his defib went off 1 time only.  Stated he was feeling dizzy and then it fired, feels good now.  No problems since then.  Advised would forward to [employer] for Dr Lovena Le as well as the device clinic to see if they tried to call him.  Told him he would not hear back if our office did not call.

## 2011-12-15 NOTE — Telephone Encounter (Signed)
Pt requesting samples of Crestor, pls call [phone], and wants to know if dr taylor has tried to call him in the last day or two?

## 2011-12-26 ENCOUNTER — Encounter: Payer: Self-pay | Admitting: Internal Medicine

## 2011-12-26 ENCOUNTER — Ambulatory Visit (INDEPENDENT_AMBULATORY_CARE_PROVIDER_SITE_OTHER): Payer: Medicare Other | Admitting: *Deleted

## 2011-12-26 ENCOUNTER — Ambulatory Visit (INDEPENDENT_AMBULATORY_CARE_PROVIDER_SITE_OTHER): Payer: Medicare Other | Admitting: Cardiology

## 2011-12-26 ENCOUNTER — Encounter: Payer: Self-pay | Admitting: Cardiology

## 2011-12-26 VITALS — BP 118/68 | HR 64 | Ht 71.0 in | Wt 212.0 lb

## 2011-12-26 DIAGNOSIS — I472 Ventricular tachycardia: Secondary | ICD-10-CM

## 2011-12-26 DIAGNOSIS — E119 Type 2 diabetes mellitus without complications: Secondary | ICD-10-CM

## 2011-12-26 DIAGNOSIS — I509 Heart failure, unspecified: Secondary | ICD-10-CM

## 2011-12-26 DIAGNOSIS — Z95810 Presence of automatic (implantable) cardiac defibrillator: Secondary | ICD-10-CM

## 2011-12-26 DIAGNOSIS — E7800 Pure hypercholesterolemia, unspecified: Secondary | ICD-10-CM

## 2011-12-26 DIAGNOSIS — I2589 Other forms of chronic ischemic heart disease: Secondary | ICD-10-CM

## 2011-12-26 DIAGNOSIS — I255 Ischemic cardiomyopathy: Secondary | ICD-10-CM

## 2011-12-26 DIAGNOSIS — E785 Hyperlipidemia, unspecified: Secondary | ICD-10-CM

## 2011-12-26 DIAGNOSIS — I4729 Other ventricular tachycardia: Secondary | ICD-10-CM

## 2011-12-26 LAB — ICD DEVICE OBSERVATION
AL IMPEDENCE ICD: 440 Ohm
AL THRESHOLD: 0.5 V
BAMS-0001: 170 {beats}/min
DEV-0020ICD: NEGATIVE
RV LEAD AMPLITUDE: 10.4 mv
RV LEAD IMPEDENCE ICD: 712 Ohm
RV LEAD THRESHOLD: 1 V
TZAT-0001ATACH: 2
TZAT-0001SLOWVT: 1
TZAT-0002ATACH: NEGATIVE
TZAT-0002FASTVT: NEGATIVE
TZAT-0002SLOWVT: NEGATIVE
TZAT-0012ATACH: 150 ms
TZAT-0012SLOWVT: 200 ms
TZAT-0018ATACH: NEGATIVE
TZAT-0018FASTVT: NEGATIVE
TZAT-0018SLOWVT: NEGATIVE
TZAT-0018SLOWVT: NEGATIVE
TZAT-0019ATACH: 6 V
TZAT-0019ATACH: 6 V
TZAT-0019FASTVT: 8 V
TZAT-0019SLOWVT: 8 V
TZAT-0019SLOWVT: 8 V
TZAT-0020ATACH: 1.5 ms
TZAT-0020ATACH: 1.5 ms
TZAT-0020ATACH: 1.5 ms
TZAT-0020FASTVT: 1.5 ms
TZON-0003VSLOWVT: 390 ms
TZON-0004SLOWVT: 16
TZON-0004VSLOWVT: 20
TZST-0001FASTVT: 3
TZST-0001FASTVT: 5
TZST-0001FASTVT: 6
TZST-0001SLOWVT: 3
TZST-0001SLOWVT: 4
TZST-0002ATACH: NEGATIVE
TZST-0002FASTVT: NEGATIVE
TZST-0002FASTVT: NEGATIVE
TZST-0002FASTVT: NEGATIVE
TZST-0002SLOWVT: NEGATIVE
VENTRICULAR PACING ICD: 0.1 pct

## 2011-12-26 MED ORDER — LOVASTATIN 10 MG PO TABS: 10.0000 mg | ORAL_TABLET | Freq: Every day | ORAL | Status: DC

## 2011-12-26 NOTE — Patient Instructions (Signed)
Your physician recommends that you continue on your current medications as directed. Please refer to the Current Medication list given to you today.  Your physician wants you to follow-up in: 6 months. You will receive a reminder letter in the mail two months in advance. If you don't receive a letter, please call our office to schedule the follow-up appointment.  

## 2011-12-26 NOTE — Progress Notes (Signed)
[name] Date of Birth:  1941/01/14 [facility] [address] [place], [place]  [address] [phone]         Fax   [phone]  History of Present Illness: This pleasant 71 year old gentleman is seen for a scheduled 6 month followup office visit.  He has a history of ischemic heart disease.  He had a history of a prior anterolateral myocardial infarction.  He has a low ejection fraction is defibrillator which was placed prophylactically.  2 weeks ago he had an episode in church.  He was very emotional.  The church was in the process of writing for him and he felt his defibrillator fire.  He was dizzy but did not lose consciousness.  He did not have any chest pain.  He had had an emotional week because his son had just been diagnosed with leukemia earlier that week.  Current Outpatient Prescriptions  Medication Sig Dispense Refill  . aspirin 81 MG tablet Take 81 mg by mouth daily.        . carvedilol (COREG) 6.25 MG tablet TAKE 1 TABLET TWICE A DAY WITH MEALS  180 tablet  3  . glimepiride (AMARYL) 2 MG tablet Take 4 mg by mouth daily before breakfast.        . hydrochlorothiazide (,MICROZIDE/HYDRODIURIL,) 12.5 MG capsule TAKE 1 CAPSULE EVERY OTHER DAY OR AS DIRECTED  90 capsule  3  . levothyroxine (SYNTHROID, LEVOTHROID) 137 MCG tablet Take 150 mcg by mouth daily.       Marland Kitchen lisinopril (PRINIVIL,ZESTRIL) 5 MG tablet TAKE 1 TABLET DAILY  90 tablet  3  . lovastatin (MEVACOR) 10 MG tablet Take 1 tablet (10 mg total) by mouth at bedtime.  28 tablet  0  . metFORMIN (GLUCOPHAGE) 500 MG tablet Take 1,000 mg by mouth 2 (two) times daily with a meal.        . Multiple Vitamin (MULTIVITAMIN) tablet Take 1 tablet by mouth daily.        . sitaGLIPtin (JANUVIA) 100 MG tablet Take 100 mg by mouth daily.        . Tamsulosin HCl (FLOMAX) 0.4 MG CAPS Take 0.4 mg by mouth daily.        . vitamin C (ASCORBIC ACID) 500 MG tablet Take 500 mg by mouth daily.        Marland Kitchen DISCONTD:  rosuvastatin (CRESTOR) 20 MG tablet Take 1 tablet (20 mg total) by mouth daily.  30 tablet  11  . DISCONTD: rosuvastatin (CRESTOR) 20 MG tablet Take 20 mg by mouth daily. Takes when samples available        Allergies  Allergen Reactions  . Lipitor (Atorvastatin Calcium)     Memory problems    Patient Active Problem List  Diagnoses  . CARDIOMYOPATHY, ISCHEMIC  . SINUS BRADYCARDIA  . CHF  . IMPLANTATION OF DEFIBRILLATOR, HX OF  . Chronic ischemic heart disease, unspecified  . Dyslipidemia    History  Smoking status  . Never Smoker   Smokeless tobacco  . Not on file    History  Alcohol Use No    Family History  Problem Relation Age of Onset  . Heart disease Mother   . Heart attack Mother     Review of Systems: Constitutional: no fever chills diaphoresis or fatigue or change in weight.  Head and neck: no hearing loss, no epistaxis, no photophobia or visual disturbance. Respiratory: No cough, shortness of breath or wheezing. Cardiovascular: No chest pain peripheral edema, palpitations. Gastrointestinal:  No abdominal distention, no abdominal pain, no change in bowel habits hematochezia or melena. Genitourinary: No dysuria, no frequency, no urgency, no nocturia. Musculoskeletal:No arthralgias, no back pain, no gait disturbance or myalgias. Neurological: No dizziness, no headaches, no numbness, no seizures, no syncope, no weakness, no tremors. Hematologic: No lymphadenopathy, no easy bruising. Psychiatric: No confusion, no hallucinations, no sleep disturbance.    Physical Exam: Filed Vitals:   12/26/11 0939  BP: 118/68  Pulse: 64   the general appearance reveals a well-developed well-nourished gentleman in no distress.The head and neck exam reveals pupils equal and reactive.  Extraocular movements are full.  There is no scleral icterus.  The mouth and pharynx are normal.  The neck is supple.  The carotids reveal no bruits.  The jugular venous pressure is normal.  The   thyroid is not enlarged.  There is no lymphadenopathy.  The chest is clear to percussion and auscultation.  There are no rales or rhonchi.  Expansion of the chest is symmetrical.  The precordium is quiet.  The first heart sound is normal.  The second heart sound is physiologically split.  There is no murmur gallop rub or click.  There is no abnormal lift or heave.  The abdomen is soft and nontender.  The bowel sounds are normal.  The liver and spleen are not enlarged.  There are no abdominal masses.  There are no abdominal bruits.  Extremities reveal good pedal pulses.  There is no phlebitis or edema.  There is no cyanosis or clubbing.  Strength is normal and symmetrical in all extremities.  There is no lateralizing weakness.  There are no sensory deficits.  The skin is warm and dry.  There is no rash.     Assessment / Plan: The patient is to continue same medication.  He was encouraged to continue on his weight loss regimen to help his diabetes and his lipids.  His lipids will be followed by his primary care provider.  We will plan to see him in 6 months for followup office visit and EKG.

## 2011-12-26 NOTE — Assessment & Plan Note (Signed)
The patient is diabetic and has hypercholesterolemia.  Since we last saw him he has lost 15 pounds of weight intentionally.  He has cut out the sweets and carbohydrates and has seen the dietitian.  His hemoglobin A1c had risen to above 8.  On his own with weight loss he has started to have some hypoglycemic events and on his own he cut back on his metformin to 1 tablet twice a day

## 2011-12-26 NOTE — Assessment & Plan Note (Signed)
The patient has not had any symptoms of congestive heart failure.

## 2011-12-26 NOTE — Progress Notes (Signed)
Seen as add-on for Dr Mare Ferrari.    Pt with 1 episode of VT in VF zone cycle length 29msec.  No ATP programmed in this zone.  Terminated with 25J shock.  This is patient's first appropriate therapy from his device.  See PaceArt report for full details.  Plan to follow up as scheduled.  Medication changes/driving restrictions per Dr Lovena Le.

## 2011-12-26 NOTE — Assessment & Plan Note (Signed)
The patient has not been experiencing any chest pain or shortness of breath.  It exercise tolerance is limited by chronic exertional right hip pain.

## 2012-01-12 ENCOUNTER — Encounter: Payer: Self-pay | Admitting: Internal Medicine

## 2012-01-12 ENCOUNTER — Ambulatory Visit (INDEPENDENT_AMBULATORY_CARE_PROVIDER_SITE_OTHER): Payer: Medicare Other | Admitting: *Deleted

## 2012-01-12 DIAGNOSIS — I259 Chronic ischemic heart disease, unspecified: Secondary | ICD-10-CM

## 2012-01-12 DIAGNOSIS — I509 Heart failure, unspecified: Secondary | ICD-10-CM

## 2012-01-12 LAB — REMOTE ICD DEVICE
AL AMPLITUDE: 5.5 mv
AL IMPEDENCE ICD: 440 Ohm
BAMS-0001: 170 {beats}/min
BATTERY VOLTAGE: 3.04 V
TOT-0006: 20090908000000
TZAT-0001ATACH: 2
TZAT-0001SLOWVT: 2
TZAT-0002ATACH: NEGATIVE
TZAT-0002ATACH: NEGATIVE
TZAT-0012ATACH: 150 ms
TZAT-0012SLOWVT: 200 ms
TZAT-0018ATACH: NEGATIVE
TZAT-0018FASTVT: NEGATIVE
TZAT-0018SLOWVT: NEGATIVE
TZAT-0018SLOWVT: NEGATIVE
TZAT-0019ATACH: 6 V
TZAT-0019ATACH: 6 V
TZAT-0019ATACH: 6 V
TZAT-0019FASTVT: 8 V
TZAT-0020ATACH: 1.5 ms
TZAT-0020FASTVT: 1.5 ms
TZAT-0020SLOWVT: 1.5 ms
TZON-0003SLOWVT: 350 ms
TZON-0004SLOWVT: 16
TZON-0004VSLOWVT: 20
TZST-0001ATACH: 5
TZST-0001FASTVT: 2
TZST-0001FASTVT: 6
TZST-0001SLOWVT: 3
TZST-0001SLOWVT: 5
TZST-0002ATACH: NEGATIVE
TZST-0002FASTVT: NEGATIVE
TZST-0002FASTVT: NEGATIVE
TZST-0002SLOWVT: NEGATIVE
TZST-0002SLOWVT: NEGATIVE
VENTRICULAR PACING ICD: 0.02 pct
VF: 0

## 2012-01-20 ENCOUNTER — Encounter: Payer: Self-pay | Admitting: *Deleted

## 2012-01-23 ENCOUNTER — Telehealth: Payer: Self-pay | Admitting: Cardiology

## 2012-01-23 NOTE — Telephone Encounter (Signed)
Advised samples will be up front for pick up

## 2012-01-23 NOTE — Telephone Encounter (Signed)
Please return call to patient at [phone], regarding Crestor Samples.

## 2012-01-27 NOTE — Progress Notes (Signed)
ICD remote with ICM 

## 2012-02-16 ENCOUNTER — Telehealth: Payer: Self-pay | Admitting: Cardiology

## 2012-02-16 NOTE — Telephone Encounter (Signed)
Samples placed at reception desk and pt notified.

## 2012-02-16 NOTE — Telephone Encounter (Signed)
New Problem:    Patient called in wondering if we had any samples of Crestor 20mg  available for him to have.  Please call back.

## 2012-03-19 ENCOUNTER — Telehealth: Payer: Self-pay | Admitting: Cardiology

## 2012-03-19 NOTE — Telephone Encounter (Signed)
Please return call to patient [phone] regarding Crestor Samples.

## 2012-03-20 NOTE — Telephone Encounter (Signed)
Samples of Crestor given, patient never took lovastatin.

## 2012-03-27 ENCOUNTER — Telehealth: Payer: Self-pay | Admitting: Cardiology

## 2012-03-27 MED ORDER — LOSARTAN POTASSIUM 25 MG PO TABS: 25.0000 mg | ORAL_TABLET | Freq: Every day | ORAL | Status: DC

## 2012-03-27 NOTE — Telephone Encounter (Signed)
Spoke with Dr. Mare Ferrari and recommended pt change to Losartan 25mg  1 tablet qd. Pt aware and agrees. Horton Chin RN

## 2012-03-27 NOTE — Telephone Encounter (Signed)
New msg Pt wants to talk to you about lisinopril. He wants to switch to another med

## 2012-03-27 NOTE — Telephone Encounter (Signed)
Pt's friend was on Accupril for years and what sounds like had developed an allergic reaction to it, had throat swelling and almost died.  pt now refuses to take Lisinopril because he dont want the same to happen, tried to explain the unlikeness of developing an allergic reaction and we can develop an allergy to things we were never allergic to prior, pt declines use of  Lisinopril, please advise.

## 2012-04-19 ENCOUNTER — Encounter: Payer: Self-pay | Admitting: Internal Medicine

## 2012-04-19 ENCOUNTER — Ambulatory Visit (INDEPENDENT_AMBULATORY_CARE_PROVIDER_SITE_OTHER): Payer: Medicare Other | Admitting: *Deleted

## 2012-04-19 DIAGNOSIS — I509 Heart failure, unspecified: Secondary | ICD-10-CM

## 2012-04-19 DIAGNOSIS — I259 Chronic ischemic heart disease, unspecified: Secondary | ICD-10-CM

## 2012-04-20 LAB — REMOTE ICD DEVICE
BAMS-0001: 170 {beats}/min
BATTERY VOLTAGE: 3.04 V
DEV-0020ICD: NEGATIVE
PACEART VT: 0
RV LEAD AMPLITUDE: 9.7 mv
TZAT-0001ATACH: 1
TZAT-0001ATACH: 2
TZAT-0001FASTVT: 1
TZAT-0001SLOWVT: 2
TZAT-0002ATACH: NEGATIVE
TZAT-0002ATACH: NEGATIVE
TZAT-0002ATACH: NEGATIVE
TZAT-0012FASTVT: 200 ms
TZAT-0012SLOWVT: 200 ms
TZAT-0018ATACH: NEGATIVE
TZAT-0018ATACH: NEGATIVE
TZAT-0018ATACH: NEGATIVE
TZAT-0018FASTVT: NEGATIVE
TZAT-0019ATACH: 6 V
TZAT-0019SLOWVT: 8 V
TZAT-0020ATACH: 1.5 ms
TZAT-0020FASTVT: 1.5 ms
TZAT-0020SLOWVT: 1.5 ms
TZAT-0020SLOWVT: 1.5 ms
TZON-0003ATACH: 350 ms
TZON-0003SLOWVT: 350 ms
TZON-0004SLOWVT: 16
TZON-0004VSLOWVT: 20
TZST-0001FASTVT: 2
TZST-0001FASTVT: 4
TZST-0001FASTVT: 6
TZST-0001SLOWVT: 3
TZST-0001SLOWVT: 4
TZST-0001SLOWVT: 5
TZST-0002ATACH: NEGATIVE
TZST-0002ATACH: NEGATIVE
TZST-0002FASTVT: NEGATIVE
TZST-0002FASTVT: NEGATIVE
TZST-0002FASTVT: NEGATIVE
TZST-0002SLOWVT: NEGATIVE
TZST-0002SLOWVT: NEGATIVE
VENTRICULAR PACING ICD: 0.05 pct
VF: 0

## 2012-04-23 ENCOUNTER — Other Ambulatory Visit: Payer: Self-pay | Admitting: Cardiology

## 2012-04-23 NOTE — Telephone Encounter (Signed)
Pt needs samples of crestor 20mg  he also needs losartan 25mg  samples if we have it and he needs that called into [facility] please call pt and advise

## 2012-04-24 MED ORDER — LOSARTAN POTASSIUM 25 MG PO TABS: 25.0000 mg | ORAL_TABLET | Freq: Every day | ORAL | Status: DC

## 2012-04-27 ENCOUNTER — Encounter: Payer: Self-pay | Admitting: *Deleted

## 2012-05-21 ENCOUNTER — Telehealth: Payer: Self-pay | Admitting: Cardiology

## 2012-05-21 NOTE — Telephone Encounter (Signed)
Samples of Crestor left at front desk, advised patient

## 2012-05-21 NOTE — Telephone Encounter (Signed)
Pt requesting samples of crestor

## 2012-06-18 ENCOUNTER — Telehealth: Payer: Self-pay | Admitting: Cardiology

## 2012-06-18 NOTE — Telephone Encounter (Signed)
plz return call to pt [phone] regarding Crestor Samples

## 2012-06-18 NOTE — Telephone Encounter (Signed)
Samples put up front for patient

## 2012-06-25 ENCOUNTER — Ambulatory Visit (INDEPENDENT_AMBULATORY_CARE_PROVIDER_SITE_OTHER): Payer: Medicare Other | Admitting: Cardiology

## 2012-06-25 ENCOUNTER — Encounter: Payer: Self-pay | Admitting: Cardiology

## 2012-06-25 VITALS — BP 117/74 | HR 59 | Ht 70.5 in | Wt 211.1 lb

## 2012-06-25 DIAGNOSIS — Z95810 Presence of automatic (implantable) cardiac defibrillator: Secondary | ICD-10-CM

## 2012-06-25 DIAGNOSIS — I25810 Atherosclerosis of coronary artery bypass graft(s) without angina pectoris: Secondary | ICD-10-CM

## 2012-06-25 DIAGNOSIS — E785 Hyperlipidemia, unspecified: Secondary | ICD-10-CM

## 2012-06-25 DIAGNOSIS — I2589 Other forms of chronic ischemic heart disease: Secondary | ICD-10-CM

## 2012-06-25 NOTE — Patient Instructions (Addendum)
Your physician recommends that you continue on your current medications as directed. Please refer to the Current Medication list given to you today.  Your physician wants you to follow-up in: 6 months. You will receive a reminder letter in the mail two months in advance. If you don't receive a letter, please call our office to schedule the follow-up appointment.  

## 2012-06-26 NOTE — Assessment & Plan Note (Signed)
The patient has had no further shocks from his defibrillator since his last office visit 6 months ago

## 2012-06-26 NOTE — Progress Notes (Signed)
Rodney Lee Date of Birth:  Mar 20, 1941 [facility] [address] [place],   [address] [phone]         Fax   [phone]  History of Present Illness: This pleasant 71 year old gentleman is seen for a scheduled 6 month followup office visit. He has a history of ischemic heart disease. He had a history of a prior anterolateral myocardial infarction. He has a low ejection fraction is defibrillator which was placed prophylactically.  The patient has a history of hypercholesterolemia and is diabetic and has a past history of high blood pressure.  He has had a problem with exogenous obesity.   Current Outpatient Prescriptions  Medication Sig Dispense Refill  . aspirin 81 MG tablet Take 81 mg by mouth daily.        . carvedilol (COREG) 6.25 MG tablet TAKE 1 TABLET TWICE A DAY WITH MEALS  180 tablet  3  . glimepiride (AMARYL) 2 MG tablet Take 4 mg by mouth daily before breakfast.        . hydrochlorothiazide (,MICROZIDE/HYDRODIURIL,) 12.5 MG capsule TAKE 1 CAPSULE EVERY OTHER DAY OR AS DIRECTED  90 capsule  3  . levothyroxine (SYNTHROID, LEVOTHROID) 137 MCG tablet Take 150 mcg by mouth daily.       Marland Kitchen losartan (COZAAR) 25 MG tablet Take 1 tablet (25 mg total) by mouth daily.  30 tablet  3  . metFORMIN (GLUCOPHAGE) 500 MG tablet Take 1,000 mg by mouth 2 (two) times daily with a meal.        . Multiple Vitamin (MULTIVITAMIN) tablet Take 1 tablet by mouth daily.        . rosuvastatin (CRESTOR) 20 MG tablet Take 20 mg by mouth daily.      . Tamsulosin HCl (FLOMAX) 0.4 MG CAPS Take 0.4 mg by mouth daily.        . vitamin C (ASCORBIC ACID) 500 MG tablet Take 500 mg by mouth daily.          Allergies  Allergen Reactions  . Lipitor (Atorvastatin Calcium)     Memory problems    Patient Active Problem List  Diagnosis  . CARDIOMYOPATHY, ISCHEMIC  . SINUS BRADYCARDIA  . CHF  . IMPLANTATION OF DEFIBRILLATOR, HX OF  . Chronic ischemic heart disease, unspecified    . Dyslipidemia    History  Smoking status  . Never Smoker   Smokeless tobacco  . Not on file    History  Alcohol Use No    Family History  Problem Relation Age of Onset  . Heart disease Mother   . Heart attack Mother     Review of Systems: Constitutional: no fever chills diaphoresis or fatigue or change in weight.  Head and neck: no hearing loss, no epistaxis, no photophobia or visual disturbance. Respiratory: No cough, shortness of breath or wheezing. Cardiovascular: No chest pain peripheral edema, palpitations. Gastrointestinal: No abdominal distention, no abdominal pain, no change in bowel habits hematochezia or melena. Genitourinary: No dysuria, no frequency, no urgency, no nocturia. Musculoskeletal:No arthralgias, no back pain, no gait disturbance or myalgias. Neurological: No dizziness, no headaches, no numbness, no seizures, no syncope, no weakness, no tremors. Hematologic: No lymphadenopathy, no easy bruising. Psychiatric: No confusion, no hallucinations, no sleep disturbance.    Physical Exam: Filed Vitals:   06/25/12 0840  BP: 117/74  Pulse: 59   the general appearance reveals a well-developed well-nourished gentleman in no distress.The head and neck exam reveals pupils equal and reactive.  Extraocular  movements are full.  There is no scleral icterus.  The mouth and pharynx are normal.  The neck is supple.  The carotids reveal no bruits.  The jugular venous pressure is normal.  The  thyroid is not enlarged.  There is no lymphadenopathy.  The chest is clear to percussion and auscultation.  There are no rales or rhonchi.  Expansion of the chest is symmetrical.  The precordium is quiet.  The first heart sound is normal.  The second heart sound is physiologically split.  There is no murmur gallop rub or click.  There is no abnormal lift or heave.  The abdomen is soft and nontender.  The bowel sounds are normal.  The liver and spleen are not enlarged.  There are no  abdominal masses.  There are no abdominal bruits.  Extremities reveal good pedal pulses.  There is no phlebitis or edema.  There is no cyanosis or clubbing.  Strength is normal and symmetrical in all extremities.  There is no lateralizing weakness.  There are no sensory deficits.  The skin is warm and dry.  There is no rash.  EKG today shows an atrial paced rhythm at 60 per minute and he has a pattern of an old extensive anteroseptal myocardial infarction   Assessment / Plan:  Continue same medication.  Recheck in 6 months for followup office visit.  His primary care provider has been checking his lipids recently.

## 2012-06-26 NOTE — Assessment & Plan Note (Signed)
The patient has not been experiencing any chest pain or angina.  His exercise tolerance is limited by arthritis of his right hip.  He is unable to walk more than about 15 minutes at a time.

## 2012-06-26 NOTE — Assessment & Plan Note (Signed)
The patient has dyslipidemia and is on Crestor 20 mg daily.  He is not having any myalgias.

## 2012-07-23 ENCOUNTER — Ambulatory Visit (INDEPENDENT_AMBULATORY_CARE_PROVIDER_SITE_OTHER): Payer: Medicare Other | Admitting: *Deleted

## 2012-07-23 DIAGNOSIS — I259 Chronic ischemic heart disease, unspecified: Secondary | ICD-10-CM

## 2012-07-23 DIAGNOSIS — I509 Heart failure, unspecified: Secondary | ICD-10-CM

## 2012-07-23 DIAGNOSIS — Z95810 Presence of automatic (implantable) cardiac defibrillator: Secondary | ICD-10-CM

## 2012-07-24 ENCOUNTER — Encounter: Payer: Self-pay | Admitting: Internal Medicine

## 2012-07-27 LAB — REMOTE ICD DEVICE
BAMS-0001: 170 {beats}/min
BATTERY VOLTAGE: 3 V
DEV-0020ICD: NEGATIVE
FVT: 0
PACEART VT: 0
RV LEAD AMPLITUDE: 9.7 mv
TZAT-0001ATACH: 1
TZAT-0001ATACH: 2
TZAT-0002ATACH: NEGATIVE
TZAT-0002SLOWVT: NEGATIVE
TZAT-0012ATACH: 150 ms
TZAT-0012ATACH: 150 ms
TZAT-0018ATACH: NEGATIVE
TZAT-0018ATACH: NEGATIVE
TZAT-0019ATACH: 6 V
TZAT-0019SLOWVT: 8 V
TZAT-0020ATACH: 1.5 ms
TZAT-0020FASTVT: 1.5 ms
TZAT-0020SLOWVT: 1.5 ms
TZAT-0020SLOWVT: 1.5 ms
TZON-0003ATACH: 350 ms
TZON-0003SLOWVT: 350 ms
TZON-0004SLOWVT: 16
TZON-0004VSLOWVT: 20
TZON-0005SLOWVT: 12
TZST-0001ATACH: 4
TZST-0001FASTVT: 2
TZST-0001FASTVT: 3
TZST-0001SLOWVT: 4
TZST-0001SLOWVT: 6
TZST-0002ATACH: NEGATIVE
TZST-0002FASTVT: NEGATIVE
TZST-0002FASTVT: NEGATIVE
TZST-0002SLOWVT: NEGATIVE

## 2012-08-06 ENCOUNTER — Telehealth: Payer: Self-pay | Admitting: Cardiology

## 2012-08-06 NOTE — Telephone Encounter (Signed)
#  4 lot [id number] exp 04/2015 Advised patient

## 2012-08-06 NOTE — Telephone Encounter (Signed)
Pt requesting crestor 20mg  samples

## 2012-08-15 ENCOUNTER — Encounter: Payer: Self-pay | Admitting: *Deleted

## 2012-08-27 ENCOUNTER — Telehealth: Payer: Self-pay | Admitting: Cardiology

## 2012-08-27 MED ORDER — LOSARTAN POTASSIUM 25 MG PO TABS: 25.0000 mg | ORAL_TABLET | Freq: Every day | ORAL | Status: DC

## 2012-08-27 NOTE — Telephone Encounter (Signed)
Pt needs refill of losartan 25mg  to [facility] mail [facility] 90 days supply

## 2012-09-03 ENCOUNTER — Telehealth: Payer: Self-pay | Admitting: Cardiology

## 2012-09-03 NOTE — Telephone Encounter (Signed)
Samples gathered and put at front for pick up

## 2012-09-03 NOTE — Telephone Encounter (Signed)
Pt needs samples of crestor 20 mg

## 2012-10-01 ENCOUNTER — Telehealth: Payer: Self-pay | Admitting: Cardiology

## 2012-10-01 NOTE — Telephone Encounter (Signed)
Pt needs samples of crestor 20mg 

## 2012-10-02 NOTE — Telephone Encounter (Signed)
Advised patient no samples at this time 

## 2012-10-09 ENCOUNTER — Telehealth: Payer: Self-pay | Admitting: Cardiology

## 2012-10-09 NOTE — Telephone Encounter (Signed)
Advised patient no samples at this time 

## 2012-10-09 NOTE — Telephone Encounter (Signed)
New Problem      Pt called in requesting samples of Crestor 20 mg.

## 2012-10-15 ENCOUNTER — Encounter: Payer: Self-pay | Admitting: Internal Medicine

## 2012-10-15 ENCOUNTER — Ambulatory Visit (INDEPENDENT_AMBULATORY_CARE_PROVIDER_SITE_OTHER): Payer: Medicare Other | Admitting: Internal Medicine

## 2012-10-15 VITALS — BP 140/80 | HR 60 | Ht 71.0 in | Wt 218.0 lb

## 2012-10-15 DIAGNOSIS — I509 Heart failure, unspecified: Secondary | ICD-10-CM

## 2012-10-15 DIAGNOSIS — I259 Chronic ischemic heart disease, unspecified: Secondary | ICD-10-CM

## 2012-10-15 DIAGNOSIS — Z95810 Presence of automatic (implantable) cardiac defibrillator: Secondary | ICD-10-CM

## 2012-10-15 DIAGNOSIS — I2589 Other forms of chronic ischemic heart disease: Secondary | ICD-10-CM

## 2012-10-15 LAB — ICD DEVICE OBSERVATION
AL AMPLITUDE: 4.2 mv
AL IMPEDENCE ICD: 432 Ohm
AL THRESHOLD: 0.5 V
BAMS-0001: 170 {beats}/min
DEV-0020ICD: NEGATIVE
RV LEAD AMPLITUDE: 9.7 mv
TZAT-0001ATACH: 1
TZAT-0001ATACH: 2
TZAT-0001ATACH: 3
TZAT-0002FASTVT: NEGATIVE
TZAT-0002SLOWVT: NEGATIVE
TZAT-0002SLOWVT: NEGATIVE
TZAT-0012ATACH: 150 ms
TZAT-0012ATACH: 150 ms
TZAT-0012FASTVT: 200 ms
TZAT-0018ATACH: NEGATIVE
TZAT-0018ATACH: NEGATIVE
TZAT-0018FASTVT: NEGATIVE
TZAT-0019FASTVT: 8 V
TZAT-0019SLOWVT: 8 V
TZAT-0020ATACH: 1.5 ms
TZAT-0020SLOWVT: 1.5 ms
TZON-0003ATACH: 350 ms
TZON-0003SLOWVT: 350 ms
TZON-0005SLOWVT: 12
TZST-0001ATACH: 4
TZST-0001ATACH: 6
TZST-0001FASTVT: 2
TZST-0001FASTVT: 4
TZST-0001FASTVT: 5
TZST-0001SLOWVT: 4
TZST-0001SLOWVT: 5
TZST-0001SLOWVT: 6
TZST-0002ATACH: NEGATIVE
TZST-0002FASTVT: NEGATIVE
TZST-0002SLOWVT: NEGATIVE
TZST-0002SLOWVT: NEGATIVE

## 2012-10-15 NOTE — Assessment & Plan Note (Signed)
His Medtronic ICD is working normally. He is atrial pacing and we have turned on his rate response today.

## 2012-10-15 NOTE — Assessment & Plan Note (Signed)
His chronic systolic heart failure appears to be well compensated. He will maintain a low-sodium diet, continue his current medications, and increase his physical activity.

## 2012-10-15 NOTE — Assessment & Plan Note (Signed)
He denies anginal symptoms. No change in medications today. I've encouraged the patient to increase his physical activity.

## 2012-10-15 NOTE — Patient Instructions (Addendum)
Remote monitoring is used to monitor your Pacemaker of ICD from home. This monitoring reduces the number of office visits required to check your device to one time per year. It allows [place] to keep an eye on the functioning of your device to ensure it is working properly. You are scheduled for a device check from home on Jan 14, 2013. You may send your transmission at any time that day. If you have a wireless device, the transmission will be sent automatically. After your physician reviews your transmission, you will receive a postcard with your next transmission date.  Your physician wants you to follow-up in: 1 year with Dr Lovena Le.  You will receive a reminder letter in the mail two months in advance. If you don't receive a letter, please call our office to schedule the follow-up appointment.

## 2012-10-15 NOTE — Progress Notes (Signed)
HPI [name] returns today for followup. He is a very pleasant 72 year old man with a long-standing ischemic cardiomyopathy, chronic systolic heart failure, diabetes, and hypertension. He is status post ICD implantation. In the interim, he has done well. He denies chest pain, shortness of breath, or peripheral edema. No syncope.no ICD shock. Allergies  Allergen Reactions  . Lipitor (Atorvastatin Calcium)     Memory problems     Current Outpatient Prescriptions  Medication Sig Dispense Refill  . aspirin 81 MG tablet Take 81 mg by mouth daily.        . carvedilol (COREG) 6.25 MG tablet TAKE 1 TABLET TWICE A DAY WITH MEALS  180 tablet  3  . glimepiride (AMARYL) 2 MG tablet Take 4 mg by mouth daily before breakfast.        . hydrochlorothiazide (,MICROZIDE/HYDRODIURIL,) 12.5 MG capsule TAKE 1 CAPSULE EVERY OTHER DAY OR AS DIRECTED  90 capsule  3  . levothyroxine (SYNTHROID, LEVOTHROID) 137 MCG tablet Take 150 mcg by mouth daily.       . metFORMIN (GLUCOPHAGE) 500 MG tablet Take 1,000 mg by mouth 2 (two) times daily with a meal.        . Multiple Vitamin (MULTIVITAMIN) tablet Take 1 tablet by mouth daily.        . rosuvastatin (CRESTOR) 20 MG tablet Take 20 mg by mouth daily.      . sitaGLIPtin (JANUVIA) 100 MG tablet Take 100 mg by mouth daily.      . Tamsulosin HCl (FLOMAX) 0.4 MG CAPS Take 0.4 mg by mouth daily.        . vitamin C (ASCORBIC ACID) 500 MG tablet Take 500 mg by mouth daily.         No current facility-administered medications for this visit.     Past Medical History  Diagnosis Date  . IHD (ischemic heart disease)   . Diabetes mellitus   . Hypertension   . Hyperlipidemia   . Hypothyroidism   . Exogenous obesity   . MI, old     ANTEROLATERAL  . CHF (congestive heart failure)   . Sinus bradycardia   . CAD (coronary artery disease)   . MI (myocardial infarction)     ROS:   All systems reviewed and negative except as noted in the HPI.   Past Surgical History   Procedure Laterality Date  . Cardiac catheterization  03/13/1994    ACUTE MI WITH TOTAL OCCLUSION OF MID LAD. REPERFUSION AFTER CROSSING WITH A GUIDEWIRE. POOR RIGHT TO LEFT COLLATERAL FLOW  . Cardiac defibrillator placement    . US echocardiography  04/26/2010    EF 35-40%. MODERATE LVH WITH MODERATE GLOBAL LV SYSTOLIC DYSFUNCTION AND IMPAIRED RELAXATION. MILD AORTIC SCLEROSIS WITHOUT STENOSIS. NORMAL PULMONARY ARTERY PRESSURE.  Marland Kitchen US echocardiography  10/07/2004    EF 30-35%  . Cardiovascular stress test  03/11/2008    EF 30%. NO REVERSIBLE ISCHEMIA  . Angioplasty       Family History  Problem Relation Age of Onset  . Heart disease Mother   . Heart attack Mother      History   Social History  . Marital Status: Married    Spouse Name: N/A    Number of Children: N/A  . Years of Education: N/A   Occupational History  . Not on file.   Social History Main Topics  . Smoking status: Never Smoker   . Smokeless tobacco: Not on file  . Alcohol Use: No  . Drug Use: No  .  Sexually Active:    Other Topics Concern  . Not on file   Social History Narrative  . No narrative on file     BP 140/80  Pulse 60  Ht 5\' 11"  (1.803 m)  Wt 218 lb (98.884 kg)  BMI 30.42 kg/m2  Physical Exam:  Well appearing 72 year old man,NAD HEENT: Unremarkable Neck:  7 cm JVD, no thyromegally Lungs:  Clear with no wheezes, rales, or rhonchi. Well-healed ICD incision. HEART:  Regular rate rhythm, no murmurs, no rubs, no clicks Abd:  soft, positive bowel sounds, no organomegally, no rebound, no guarding Ext:  2 plus pulses, no edema, no cyanosis, no clubbing Skin:  No rashes no nodules Neuro:  CN II through XII intact, motor grossly intact  DEVICE  Normal device function.  See PaceArt for details.   Assess/Plan:

## 2012-10-22 ENCOUNTER — Encounter: Payer: Self-pay | Admitting: Internal Medicine

## 2012-10-26 ENCOUNTER — Telehealth: Payer: Self-pay | Admitting: Cardiology

## 2012-10-26 NOTE — Telephone Encounter (Signed)
None at this time, advised patient

## 2012-10-26 NOTE — Telephone Encounter (Signed)
New problem    Samples of crestor 10 mg .

## 2012-11-01 ENCOUNTER — Telehealth: Payer: Self-pay | Admitting: Cardiology

## 2012-11-01 NOTE — Telephone Encounter (Signed)
New Prob     Pt requesting samples of Crestor 20 mg.

## 2012-11-01 NOTE — Telephone Encounter (Signed)
#   4  PACKS LOT # [id number] EXP 08/16 ADVISED PATIENT

## 2012-11-26 ENCOUNTER — Telehealth: Payer: Self-pay | Admitting: Cardiology

## 2012-11-26 NOTE — Telephone Encounter (Signed)
Pt requesting crestor samples

## 2012-11-26 NOTE — Telephone Encounter (Signed)
Crestro 20 mg # 4 lot # [id number] exp 10/16 up front for patient pick up. Advised patient

## 2012-12-24 ENCOUNTER — Encounter: Payer: Self-pay | Admitting: Cardiology

## 2012-12-24 ENCOUNTER — Ambulatory Visit (INDEPENDENT_AMBULATORY_CARE_PROVIDER_SITE_OTHER): Payer: Medicare Other | Admitting: Cardiology

## 2012-12-24 VITALS — BP 124/68 | HR 64 | Ht 70.5 in | Wt 216.8 lb

## 2012-12-24 DIAGNOSIS — Z95810 Presence of automatic (implantable) cardiac defibrillator: Secondary | ICD-10-CM

## 2012-12-24 DIAGNOSIS — E785 Hyperlipidemia, unspecified: Secondary | ICD-10-CM

## 2012-12-24 DIAGNOSIS — I259 Chronic ischemic heart disease, unspecified: Secondary | ICD-10-CM

## 2012-12-24 DIAGNOSIS — R079 Chest pain, unspecified: Secondary | ICD-10-CM

## 2012-12-24 DIAGNOSIS — I2589 Other forms of chronic ischemic heart disease: Secondary | ICD-10-CM

## 2012-12-24 MED ORDER — NITROGLYCERIN 0.4 MG SL SUBL: 0.4000 mg | SUBLINGUAL_TABLET | SUBLINGUAL | Status: DC | PRN

## 2012-12-24 NOTE — Patient Instructions (Addendum)
Your physician recommends that you continue on your current medications as directed. Please refer to the Current Medication list given to you today.  Your physician wants you to follow-up in: 6 month ov/ekg You will receive a reminder letter in the mail two months in advance. If you don't receive a letter, please call our office to schedule the follow-up appointment.  

## 2012-12-24 NOTE — Assessment & Plan Note (Signed)
The patient is tolerating Crestor 20 mg one daily without any myalgias or side effects

## 2012-12-24 NOTE — Assessment & Plan Note (Signed)
The patient has had no recurrent angina pectoris.

## 2012-12-24 NOTE — Assessment & Plan Note (Signed)
The patient has not been aware of any subsequent shocks from his ICD

## 2012-12-24 NOTE — Progress Notes (Signed)
Rodney Lee Date of Birth:  1941-04-23 [facility] [address] [place], [place]  [address] [phone]         Fax   [phone]  History of Present Illness: This pleasant 72 year old gentleman is seen for a scheduled 6 month followup office visit. He has a history of ischemic heart disease. He had a history of a prior anterolateral myocardial infarction. He has a low ejection fraction is defibrillator which was placed prophylactically. The patient has a history of hypercholesterolemia and is diabetic and has a past history of high blood pressure.  His lipids are followed by his PCP. He has had a problem with exogenous obesity.  He has a difficult time walking more than 15 minutes because of right hip pain but he does not intend to have any hip surgery.  Current Outpatient Prescriptions  Medication Sig Dispense Refill  . aspirin 81 MG tablet Take 81 mg by mouth daily.        . carvedilol (COREG) 6.25 MG tablet TAKE 1 TABLET TWICE A DAY WITH MEALS  180 tablet  3  . glimepiride (AMARYL) 2 MG tablet Take 4 mg by mouth daily before breakfast.        . hydrochlorothiazide (,MICROZIDE/HYDRODIURIL,) 12.5 MG capsule TAKE 1 CAPSULE EVERY OTHER DAY OR AS DIRECTED  90 capsule  3  . levothyroxine (SYNTHROID, LEVOTHROID) 137 MCG tablet Take 137 mcg by mouth daily.       . metFORMIN (GLUCOPHAGE) 500 MG tablet Take 1,000 mg by mouth 2 (two) times daily with a meal.        . Multiple Vitamin (MULTIVITAMIN) tablet Take 1 tablet by mouth daily.        . rosuvastatin (CRESTOR) 20 MG tablet Take 20 mg by mouth daily.      . Tamsulosin HCl (FLOMAX) 0.4 MG CAPS Take 0.4 mg by mouth daily.        . vitamin C (ASCORBIC ACID) 500 MG tablet Take 500 mg by mouth daily.        . nitroGLYCERIN (NITROSTAT) 0.4 MG SL tablet Place 1 tablet (0.4 mg total) under the tongue every 5 (five) minutes as needed for chest pain.  25 tablet  prn   No current facility-administered medications for this  visit.    Allergies  Allergen Reactions  . Lipitor (Atorvastatin Calcium)     Memory problems    Patient Active Problem List  Diagnosis  . CARDIOMYOPATHY, ISCHEMIC  . SINUS BRADYCARDIA  . CHF  . IMPLANTATION OF DEFIBRILLATOR, HX OF  . Chronic ischemic heart disease, unspecified  . Dyslipidemia    History  Smoking status  . Never Smoker   Smokeless tobacco  . Not on file    History  Alcohol Use No    Family History  Problem Relation Age of Onset  . Heart disease Mother   . Heart attack Mother     Review of Systems: Constitutional: no fever chills diaphoresis or fatigue or change in weight.  Head and neck: no hearing loss, no epistaxis, no photophobia or visual disturbance. Respiratory: No cough, shortness of breath or wheezing. Cardiovascular: No chest pain peripheral edema, palpitations. Gastrointestinal: No abdominal distention, no abdominal pain, no change in bowel habits hematochezia or melena. Genitourinary: No dysuria, no frequency, no urgency, no nocturia. Musculoskeletal:No arthralgias, no back pain, no gait disturbance or myalgias. Neurological: No dizziness, no headaches, no numbness, no seizures, no syncope, no weakness, no tremors. Hematologic: No lymphadenopathy, no easy bruising.  Psychiatric: No confusion, no hallucinations, no sleep disturbance.    Physical Exam: Filed Vitals:   12/24/12 0840  BP: 124/68  Pulse: 64   the general appearance reveals a well-developed mildly obese gentleman in no distress.The head and neck exam reveals pupils equal and reactive.  Extraocular movements are full.  There is no scleral icterus.  The mouth and pharynx are normal.  The neck is supple.  The carotids reveal no bruits.  The jugular venous pressure is normal.  The  thyroid is not enlarged.  There is no lymphadenopathy.  The chest is clear to percussion and auscultation.  There are no rales or rhonchi.  Expansion of the chest is symmetrical.  The precordium is  quiet.  The first heart sound is normal.  The second heart sound is physiologically split.  There is no murmur gallop rub or click.  There is no abnormal lift or heave.  The abdomen is soft and nontender.  The bowel sounds are normal.  The liver and spleen are not enlarged.  There are no abdominal masses.  There are no abdominal bruits.  Extremities reveal good pedal pulses.  There is no phlebitis or edema.  There is no cyanosis or clubbing.  Strength is normal and symmetrical in all extremities.  There is no lateralizing weakness.  There are no sensory deficits.  The skin is warm and dry.  There is no rash.     Assessment / Plan: Continue same medication.  We called in in a new prescription for nitroglycerin since his old ones are outdated.  I encouraged him to continue to walk as much as possible noting the limitation of his right hip pain. Recheck in 6 months for followup office visit and EKG.  Continue weight loss.

## 2012-12-24 NOTE — Assessment & Plan Note (Signed)
The patient has not been experiencing any symptoms of increased exertional dyspnea or orthopnea or paroxysmal nocturnal dyspnea or ankle edema.  His weight has been reasonably stable and is down 2 pounds from last visit

## 2013-01-09 ENCOUNTER — Telehealth: Payer: Self-pay | Admitting: Cardiology

## 2013-01-09 NOTE — Telephone Encounter (Signed)
crestor 10 mg # 4 lot # [id number], advised patient

## 2013-01-09 NOTE — Telephone Encounter (Signed)
New Prob     Pt is requesting more samples of CRESTOR 20 mg.

## 2013-01-14 ENCOUNTER — Encounter: Payer: Self-pay | Admitting: Internal Medicine

## 2013-01-14 ENCOUNTER — Ambulatory Visit (INDEPENDENT_AMBULATORY_CARE_PROVIDER_SITE_OTHER): Payer: Medicare Other | Admitting: *Deleted

## 2013-01-14 DIAGNOSIS — I2589 Other forms of chronic ischemic heart disease: Secondary | ICD-10-CM

## 2013-01-14 DIAGNOSIS — Z95810 Presence of automatic (implantable) cardiac defibrillator: Secondary | ICD-10-CM

## 2013-01-14 DIAGNOSIS — I509 Heart failure, unspecified: Secondary | ICD-10-CM

## 2013-01-21 LAB — REMOTE ICD DEVICE
AL AMPLITUDE: 3.5 mv
ATRIAL PACING ICD: 94.24 pct
FVT: 0
PACEART VT: 0
RV LEAD IMPEDENCE ICD: 744 Ohm
TOT-0001: 2
TZAT-0001ATACH: 3
TZAT-0001FASTVT: 1
TZAT-0001SLOWVT: 1
TZAT-0001SLOWVT: 2
TZAT-0002ATACH: NEGATIVE
TZAT-0002FASTVT: NEGATIVE
TZAT-0002SLOWVT: NEGATIVE
TZAT-0002SLOWVT: NEGATIVE
TZAT-0012ATACH: 150 ms
TZAT-0012ATACH: 150 ms
TZAT-0012ATACH: 150 ms
TZAT-0018ATACH: NEGATIVE
TZAT-0018SLOWVT: NEGATIVE
TZAT-0019ATACH: 6 V
TZAT-0019SLOWVT: 8 V
TZAT-0019SLOWVT: 8 V
TZAT-0020ATACH: 1.5 ms
TZAT-0020ATACH: 1.5 ms
TZAT-0020ATACH: 1.5 ms
TZON-0003ATACH: 350 ms
TZON-0003VSLOWVT: 390 ms
TZON-0004SLOWVT: 16
TZON-0005SLOWVT: 12
TZST-0001ATACH: 4
TZST-0001ATACH: 5
TZST-0001ATACH: 6
TZST-0001FASTVT: 3
TZST-0001FASTVT: 5
TZST-0001SLOWVT: 4
TZST-0001SLOWVT: 5
TZST-0001SLOWVT: 6
TZST-0002ATACH: NEGATIVE
TZST-0002ATACH: NEGATIVE
TZST-0002FASTVT: NEGATIVE
TZST-0002FASTVT: NEGATIVE
TZST-0002SLOWVT: NEGATIVE
VENTRICULAR PACING ICD: 0.13 pct

## 2013-01-22 ENCOUNTER — Telehealth: Payer: Self-pay | Admitting: Cardiology

## 2013-01-22 NOTE — Telephone Encounter (Signed)
No samples at this time, advised patient

## 2013-01-22 NOTE — Telephone Encounter (Signed)
New problem    Pt calling for crestor samples

## 2013-01-24 ENCOUNTER — Encounter: Payer: Self-pay | Admitting: *Deleted

## 2013-02-01 ENCOUNTER — Telehealth: Payer: Self-pay | Admitting: Cardiology

## 2013-02-01 NOTE — Telephone Encounter (Signed)
Follow UP  Pt is calling again about the prescription for Crestor. He wants to know if some samples can be place up front for him.

## 2013-02-01 NOTE — Telephone Encounter (Signed)
crestor 20 mg X 3 Blot # [id number] exp 8/16, advised patient

## 2013-02-01 NOTE — Telephone Encounter (Signed)
New Prob     Pt calling in wanting to know if samples of CRESTOR are available. Please call.

## 2013-02-22 ENCOUNTER — Telehealth: Payer: Self-pay | Admitting: Cardiology

## 2013-02-22 NOTE — Telephone Encounter (Signed)
Samples placed at front desk.  Pt informed.

## 2013-02-22 NOTE — Telephone Encounter (Signed)
New Problem  Pt is wanting some samples of CRESTOR 20 mg.

## 2013-03-18 ENCOUNTER — Telehealth: Payer: Self-pay | Admitting: *Deleted

## 2013-03-18 NOTE — Telephone Encounter (Signed)
Pt states he needs losartan refilled, I don't see this on meds list will route this to Dr. Bobbye Riggs nurse/ Medical Assistant for futher review

## 2013-03-19 ENCOUNTER — Telehealth: Payer: Self-pay | Admitting: Cardiology

## 2013-03-19 MED ORDER — LOSARTAN POTASSIUM 25 MG PO TABS: 25.0000 mg | ORAL_TABLET | Freq: Every day | ORAL | Status: DC

## 2013-03-19 NOTE — Telephone Encounter (Signed)
Spoke with patient and he has been taking his Losartan all along. Reviewed chart and do not see where  Dr. Mare Ferrari discontinued. Refilled Losartan as requested

## 2013-03-19 NOTE — Telephone Encounter (Signed)
New problem  Pt would like to speak with you regarding a prescription.

## 2013-03-19 NOTE — Telephone Encounter (Signed)
Refilled as requested  

## 2013-03-28 ENCOUNTER — Telehealth: Payer: Self-pay | Admitting: Cardiology

## 2013-03-28 NOTE — Telephone Encounter (Signed)
Samples of Crestor 10 mg #4  [id number] 1/17 advised patient

## 2013-03-28 NOTE — Telephone Encounter (Signed)
New Prob   Pt would like samples of Crestor 20 MG .

## 2013-04-11 ENCOUNTER — Telehealth: Payer: Self-pay | Admitting: Cardiology

## 2013-04-11 NOTE — Telephone Encounter (Signed)
New Prob  Pt would like samples of Crestor 20 MG .

## 2013-04-11 NOTE — Telephone Encounter (Signed)
**Note De-Identified Haileyann Staiger Obfuscation** Pts wife is advised that we do not have any samples of 20 mg Crestor. She asked what the pt was suppose to do, I attempted to send RX to the pts pharmacy but she said the pt will have to call back concerning samples.

## 2013-04-22 ENCOUNTER — Telehealth: Payer: Self-pay | Admitting: Cardiology

## 2013-04-22 NOTE — Telephone Encounter (Signed)
Pt requesting samples of crestor, pls call

## 2013-04-22 NOTE — Telephone Encounter (Signed)
Crestor 20 mg LOT [id number] EXP 4/17 #5, advised patient samples at front desk for pick up

## 2013-04-29 ENCOUNTER — Ambulatory Visit (INDEPENDENT_AMBULATORY_CARE_PROVIDER_SITE_OTHER): Payer: Medicare Other | Admitting: *Deleted

## 2013-04-29 ENCOUNTER — Encounter: Payer: Self-pay | Admitting: Internal Medicine

## 2013-04-29 DIAGNOSIS — I2589 Other forms of chronic ischemic heart disease: Secondary | ICD-10-CM

## 2013-04-29 DIAGNOSIS — I509 Heart failure, unspecified: Secondary | ICD-10-CM

## 2013-04-29 DIAGNOSIS — I495 Sick sinus syndrome: Secondary | ICD-10-CM

## 2013-04-29 DIAGNOSIS — Z95810 Presence of automatic (implantable) cardiac defibrillator: Secondary | ICD-10-CM

## 2013-05-10 LAB — REMOTE ICD DEVICE
AL AMPLITUDE: 1.6 mv
AL IMPEDENCE ICD: 432 Ohm
ATRIAL PACING ICD: 95.11 pct
CHARGE TIME: 10.84 s
RV LEAD IMPEDENCE ICD: 752 Ohm
TOT-0001: 2
TOT-0006: 20090908000000
TZAT-0001FASTVT: 1
TZAT-0001SLOWVT: 1
TZAT-0001SLOWVT: 2
TZAT-0002ATACH: NEGATIVE
TZAT-0002FASTVT: NEGATIVE
TZAT-0002SLOWVT: NEGATIVE
TZAT-0002SLOWVT: NEGATIVE
TZAT-0012ATACH: 150 ms
TZAT-0012SLOWVT: 200 ms
TZAT-0018ATACH: NEGATIVE
TZAT-0018FASTVT: NEGATIVE
TZAT-0018SLOWVT: NEGATIVE
TZAT-0019ATACH: 6 V
TZAT-0019ATACH: 6 V
TZAT-0020ATACH: 1.5 ms
TZAT-0020ATACH: 1.5 ms
TZON-0004SLOWVT: 16
TZON-0005SLOWVT: 12
TZST-0001ATACH: 5
TZST-0001FASTVT: 3
TZST-0001FASTVT: 4
TZST-0001FASTVT: 5
TZST-0001SLOWVT: 5
TZST-0001SLOWVT: 6
TZST-0002ATACH: NEGATIVE
TZST-0002FASTVT: NEGATIVE
TZST-0002FASTVT: NEGATIVE
TZST-0002SLOWVT: NEGATIVE
TZST-0002SLOWVT: NEGATIVE
VENTRICULAR PACING ICD: 0.3 pct
VF: 0

## 2013-05-14 ENCOUNTER — Other Ambulatory Visit: Payer: Self-pay

## 2013-05-14 MED ORDER — CARVEDILOL 6.25 MG PO TABS: ORAL_TABLET | ORAL | Status: DC

## 2013-05-17 NOTE — Progress Notes (Signed)
ICD remote. All functions normal, no NSVT. Full details in [place].  [facility] 12/1/4 & ROV w/ Dr. Lovena Le Feb/2015

## 2013-05-21 ENCOUNTER — Telehealth: Payer: Self-pay | Admitting: Cardiology

## 2013-05-21 NOTE — Telephone Encounter (Signed)
New Problem  In need of Crestor samples.Marland Kitchen

## 2013-05-21 NOTE — Telephone Encounter (Signed)
Returned call to patient crestor 20 mg samples left at 3rd floor front desk.

## 2013-05-29 ENCOUNTER — Encounter: Payer: Self-pay | Admitting: *Deleted

## 2013-06-24 ENCOUNTER — Ambulatory Visit: Payer: Medicare Other | Admitting: Cardiology

## 2013-07-01 ENCOUNTER — Telehealth: Payer: Self-pay

## 2013-07-01 NOTE — Telephone Encounter (Signed)
Patient call wanting samples of crestor, we are out I will call Him when we get samples

## 2013-07-02 ENCOUNTER — Telehealth: Payer: Self-pay

## 2013-07-02 NOTE — Telephone Encounter (Signed)
Call patient to let him know that samples of crestor was at the front desk

## 2013-07-09 ENCOUNTER — Ambulatory Visit (INDEPENDENT_AMBULATORY_CARE_PROVIDER_SITE_OTHER): Payer: Medicare Other | Admitting: Cardiology

## 2013-07-09 ENCOUNTER — Encounter: Payer: Self-pay | Admitting: Cardiology

## 2013-07-09 VITALS — BP 120/70 | HR 64 | Ht 70.5 in | Wt 210.8 lb

## 2013-07-09 DIAGNOSIS — Z95810 Presence of automatic (implantable) cardiac defibrillator: Secondary | ICD-10-CM

## 2013-07-09 DIAGNOSIS — I509 Heart failure, unspecified: Secondary | ICD-10-CM

## 2013-07-09 DIAGNOSIS — I259 Chronic ischemic heart disease, unspecified: Secondary | ICD-10-CM

## 2013-07-09 DIAGNOSIS — E785 Hyperlipidemia, unspecified: Secondary | ICD-10-CM

## 2013-07-09 DIAGNOSIS — E7800 Pure hypercholesterolemia, unspecified: Secondary | ICD-10-CM

## 2013-07-09 DIAGNOSIS — R1310 Dysphagia, unspecified: Secondary | ICD-10-CM

## 2013-07-09 NOTE — Assessment & Plan Note (Signed)
The patient is on Crestor 20 mg daily.  Is not having any myalgias.  His lipids are followed by his PCP

## 2013-07-09 NOTE — Progress Notes (Signed)
Rodney Lee Date of Birth:  Aug 01, 1941 [address] [place], [place]  [address] [phone]         Fax   [phone]  History of Present Illness: This pleasant 72 year old gentleman is seen for a scheduled 6 month followup office visit. He has a history of ischemic heart disease. He had a history of a prior anterolateral myocardial infarction. He has a low ejection fraction and has a defibrillator which was placed prophylactically.  He is followed for this by Dr. Lovena Le. The patient has a history of hypercholesterolemia and is diabetic and has a past history of high blood pressure.  His lipids are followed by his PCP. He has had a problem with exogenous obesity.  He has a difficult time walking more than 15 minutes because of right hip pain but he does not intend to have any hip surgery.  Since last visit he has lost 6 pounds.  He has not had any worsening of his arthritic hip symptoms.  He has had some problems with swallowing food and intends to see his gastroenterologist Dr. Michail Sermon again soon.  Current Outpatient Prescriptions  Medication Sig Dispense Refill  . aspirin 81 MG tablet Take 81 mg by mouth daily.        . carvedilol (COREG) 6.25 MG tablet TAKE 1 TABLET TWICE A DAY WITH MEALS  180 tablet  1  . glimepiride (AMARYL) 2 MG tablet Take 4 mg by mouth daily before breakfast.        . hydrochlorothiazide (,MICROZIDE/HYDRODIURIL,) 12.5 MG capsule TAKE 1 CAPSULE EVERY OTHER DAY OR AS DIRECTED  90 capsule  3  . levothyroxine (SYNTHROID, LEVOTHROID) 137 MCG tablet Take 137 mcg by mouth daily.       Marland Kitchen losartan (COZAAR) 25 MG tablet Take 1 tablet (25 mg total) by mouth daily.  90 tablet  3  . metFORMIN (GLUCOPHAGE) 500 MG tablet Take 1,000 mg by mouth 2 (two) times daily with a meal.        . Multiple Vitamin (MULTIVITAMIN) tablet Take 1 tablet by mouth daily.        . nitroGLYCERIN (NITROSTAT) 0.4 MG SL tablet Place 1 tablet (0.4 mg total) under the tongue every 5  (five) minutes as needed for chest pain.  25 tablet  prn  . rosuvastatin (CRESTOR) 20 MG tablet Take 20 mg by mouth daily.      . Tamsulosin HCl (FLOMAX) 0.4 MG CAPS Take 0.4 mg by mouth daily.        . vitamin C (ASCORBIC ACID) 500 MG tablet Take 500 mg by mouth daily.        Marland Kitchen RAPAFLO 8 MG CAPS capsule        No current facility-administered medications for this visit.    Allergies  Allergen Reactions  . Lipitor [Atorvastatin Calcium]     Memory problems    Patient Active Problem List   Diagnosis Date Noted  . Chronic ischemic heart disease, unspecified 06/20/2011  . Dyslipidemia 06/20/2011  . CARDIOMYOPATHY, ISCHEMIC 05/16/2009  . SINUS BRADYCARDIA 05/16/2009  . CHF 05/16/2009  . IMPLANTATION OF DEFIBRILLATOR, HX OF 05/16/2009    History  Smoking status  . Never Smoker   Smokeless tobacco  . Not on file    History  Alcohol Use No    Family History  Problem Relation Age of Onset  . Heart disease Mother   . Heart attack Mother     Review of Systems: Constitutional: no fever  chills diaphoresis or fatigue or change in weight.  Head and neck: no hearing loss, no epistaxis, no photophobia or visual disturbance. Respiratory: No cough, shortness of breath or wheezing. Cardiovascular: No chest pain peripheral edema, palpitations. Gastrointestinal: No abdominal distention, no abdominal pain, no change in bowel habits hematochezia or melena. Genitourinary: No dysuria, no frequency, no urgency, no nocturia. Musculoskeletal:No arthralgias, no back pain, no gait disturbance or myalgias. Neurological: No dizziness, no headaches, no numbness, no seizures, no syncope, no weakness, no tremors. Hematologic: No lymphadenopathy, no easy bruising. Psychiatric: No confusion, no hallucinations, no sleep disturbance.    Physical Exam: Filed Vitals:   07/09/13 0935  BP: 120/70  Pulse:    the general appearance reveals a well-developed mildly obese gentleman in no distress.The  head and neck exam reveals pupils equal and reactive.  Extraocular movements are full.  There is no scleral icterus.  The mouth and pharynx are normal.  The neck is supple.  The carotids reveal no bruits.  The jugular venous pressure is normal.  The  thyroid is not enlarged.  There is no lymphadenopathy.  The chest is clear to percussion and auscultation.  There are no rales or rhonchi.  Expansion of the chest is symmetrical.  The precordium is quiet.  The first heart sound is normal.  The second heart sound is physiologically split.  There is no murmur gallop rub or click.  There is no abnormal lift or heave.  The abdomen is soft and nontender.  The bowel sounds are normal.  The liver and spleen are not enlarged.  There are no abdominal masses.  There are no abdominal bruits.  Extremities reveal good pedal pulses.  There is no phlebitis or edema.  There is no cyanosis or clubbing.  Strength is normal and symmetrical in all extremities.  There is no lateralizing weakness.  There are no sensory deficits.  The skin is warm and dry.  There is no rash.  EKG today shows atrial paced rhythm with left axis deviation and incomplete left bundle branch block and old anteroseptal myocardial infarction.  The tracing is unchanged since 06/25/12.   Assessment / Plan: Continue same medication.  Recheck in 6 months for followup office visit.  Continue weight loss.

## 2013-07-09 NOTE — Patient Instructions (Signed)
Your physician recommends that you continue on your current medications as directed. Please refer to the Current Medication list given to you today.  Your physician wants you to follow-up in: 6 month ov You will receive a reminder letter in the mail two months in advance. If you don't receive a letter, please call our office to schedule the follow-up appointment.  

## 2013-07-09 NOTE — Assessment & Plan Note (Signed)
The patient has not been experiencing any recurrent chest pain or angina pectoris.

## 2013-07-09 NOTE — Assessment & Plan Note (Signed)
The patient is not having any symptoms of orthopnea or paroxysmal nocturnal dyspnea.  No pedal edema.  Weight is down 6 pounds.

## 2013-07-09 NOTE — Assessment & Plan Note (Signed)
He has not had any shocks from his defibrillator.

## 2013-07-16 ENCOUNTER — Telehealth: Payer: Self-pay | Admitting: Cardiology

## 2013-07-16 NOTE — Telephone Encounter (Signed)
Spoke with Emeline Darling, RN, Clinical Services Manager who advised that flu shots are only given while the patient is in the office - we unfortunately cannot offer flu shots visits.  I called and advised patient of this and advised him of the [facility] flu clinic offered this Saturday at [address]  I apologized to patient that he was not offered the flu shot while he was in our office.  Patient verbalized understanding.

## 2013-07-16 NOTE — Telephone Encounter (Signed)
New Problem  Pt states he came in for his appointment on 11/04. Fly shot was not mentioned and he wants to come back in to retrieve one... Please call back.

## 2013-07-31 ENCOUNTER — Other Ambulatory Visit: Payer: Self-pay

## 2013-07-31 NOTE — Telephone Encounter (Signed)
PATIENT CALLED WANTING SAMPLES OF CRESTOR I ALSO TALKED TO HIM ABOUT FILLING OUT THE PATIENT ASSISTS PROGRAM

## 2013-08-05 ENCOUNTER — Ambulatory Visit (INDEPENDENT_AMBULATORY_CARE_PROVIDER_SITE_OTHER): Payer: Medicare Other | Admitting: *Deleted

## 2013-08-05 ENCOUNTER — Encounter: Payer: Self-pay | Admitting: Internal Medicine

## 2013-08-05 DIAGNOSIS — I509 Heart failure, unspecified: Secondary | ICD-10-CM

## 2013-08-05 DIAGNOSIS — Z95810 Presence of automatic (implantable) cardiac defibrillator: Secondary | ICD-10-CM

## 2013-08-05 DIAGNOSIS — I259 Chronic ischemic heart disease, unspecified: Secondary | ICD-10-CM

## 2013-08-05 DIAGNOSIS — I2589 Other forms of chronic ischemic heart disease: Secondary | ICD-10-CM

## 2013-08-09 LAB — MDC_IDC_ENUM_SESS_TYPE_REMOTE
Battery Voltage: 2.93 V
Brady Statistic AP VP Percent: 0.3 %
Brady Statistic RV Percent Paced: 0.3 %
Lead Channel Setting Pacing Amplitude: 2.5 V
Lead Channel Setting Pacing Pulse Width: 0.4 ms
Lead Channel Setting Sensing Sensitivity: 0.3 mV
Zone Setting Detection Interval: 350 ms
Zone Setting Detection Interval: 390 ms

## 2013-08-20 ENCOUNTER — Encounter: Payer: Self-pay | Admitting: *Deleted

## 2013-08-26 ENCOUNTER — Telehealth: Payer: Self-pay | Admitting: *Deleted

## 2013-08-26 NOTE — Telephone Encounter (Signed)
crestor 20 mg 4 weeks of samples

## 2013-09-23 ENCOUNTER — Other Ambulatory Visit: Payer: Self-pay

## 2013-09-23 NOTE — Telephone Encounter (Signed)
Patient called for samples of crestor placed them up front

## 2013-10-08 ENCOUNTER — Encounter: Payer: Self-pay | Admitting: Internal Medicine

## 2013-10-08 ENCOUNTER — Ambulatory Visit (INDEPENDENT_AMBULATORY_CARE_PROVIDER_SITE_OTHER): Payer: Medicare Other | Admitting: Internal Medicine

## 2013-10-08 ENCOUNTER — Encounter (INDEPENDENT_AMBULATORY_CARE_PROVIDER_SITE_OTHER): Payer: Self-pay

## 2013-10-08 VITALS — BP 130/68 | HR 70 | Ht 71.0 in | Wt 210.0 lb

## 2013-10-08 DIAGNOSIS — I480 Paroxysmal atrial fibrillation: Secondary | ICD-10-CM | POA: Insufficient documentation

## 2013-10-08 DIAGNOSIS — I4891 Unspecified atrial fibrillation: Secondary | ICD-10-CM

## 2013-10-08 DIAGNOSIS — I495 Sick sinus syndrome: Secondary | ICD-10-CM

## 2013-10-08 DIAGNOSIS — I2589 Other forms of chronic ischemic heart disease: Secondary | ICD-10-CM

## 2013-10-08 DIAGNOSIS — Z95810 Presence of automatic (implantable) cardiac defibrillator: Secondary | ICD-10-CM

## 2013-10-08 DIAGNOSIS — I509 Heart failure, unspecified: Secondary | ICD-10-CM

## 2013-10-08 DIAGNOSIS — I259 Chronic ischemic heart disease, unspecified: Secondary | ICD-10-CM

## 2013-10-08 LAB — MDC_IDC_ENUM_SESS_TYPE_INCLINIC
Brady Statistic AP VS Percent: 94.85 %
Brady Statistic RA Percent Paced: 95.09 %
HighPow Impedance: 47 Ohm
HighPow Impedance: 64 Ohm
Lead Channel Sensing Intrinsic Amplitude: 10.0416
Lead Channel Sensing Intrinsic Amplitude: 4.7506
Lead Channel Setting Pacing Amplitude: 2 V
Lead Channel Setting Sensing Sensitivity: 0.3 mV
MDC IDC MSMT BATTERY VOLTAGE: 2.92 V
MDC IDC MSMT LEADCHNL RA IMPEDANCE VALUE: 424 Ohm
MDC IDC MSMT LEADCHNL RV IMPEDANCE VALUE: 760 Ohm
MDC IDC SESS DTM: 20150203083828
MDC IDC SET LEADCHNL RV PACING AMPLITUDE: 2.5 V
MDC IDC SET LEADCHNL RV PACING PULSEWIDTH: 0.4 ms
MDC IDC SET ZONE DETECTION INTERVAL: 350 ms
MDC IDC STAT BRADY AP VP PERCENT: 0.23 %
MDC IDC STAT BRADY AS VP PERCENT: 0 %
MDC IDC STAT BRADY AS VS PERCENT: 4.91 %
MDC IDC STAT BRADY RV PERCENT PACED: 0.24 %
Zone Setting Detection Interval: 300 ms
Zone Setting Detection Interval: 350 ms
Zone Setting Detection Interval: 390 ms

## 2013-10-08 MED ORDER — RIVAROXABAN 20 MG PO TABS: 20.0000 mg | ORAL_TABLET | Freq: Every day | ORAL | Status: DC

## 2013-10-08 NOTE — Patient Instructions (Addendum)
Your physician wants you to follow-up in: 12 months with Dr Knox Saliva will receive a reminder letter in the mail two months in advance. If you don't receive a letter, please call our office to schedule the follow-up appointment.   Your physician has recommended you make the following change in your medication:  1) Start Xarelto 20mg  daily  Remote monitoring is used to monitor your Pacemaker or ICD from home. This monitoring reduces the number of office visits required to check your device to one time per year. It allows [place] to keep an eye on the functioning of your device to ensure it is working properly. You are scheduled for a device check from home on 01/07/14. You may send your transmission at any time that day. If you have a wireless device, the transmission will be sent automatically. After your physician reviews your transmission, you will receive a postcard with your next transmission date.

## 2013-10-08 NOTE — Assessment & Plan Note (Signed)
This is a new problem. He has had one episode lasting 8 hours. His stroke risk is increased. I have asked him to start Xarelto. If the prescription cost is prohibitive, then he will start Coumadin. He has been asymptomatic.

## 2013-10-08 NOTE — Assessment & Plan Note (Signed)
His symptoms are well controlled, class 2. He will continue his current meds and maintain a low sodium diet. I have encouraged him to maintain a low sodium diet.

## 2013-10-08 NOTE — Assessment & Plan Note (Signed)
He remains active. He will continue his current meds.

## 2013-10-08 NOTE — Progress Notes (Signed)
HPI Rodney Lee returns today for followup. He is a very pleasant 73 year old man with a long-standing ischemic cardiomyopathy, chronic systolic heart failure, diabetes, and hypertension. He is status post ICD implantation. In the interim, he has done well. He denies chest pain, shortness of breath, or peripheral edema. No syncope.no ICD shock. 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 Allergies  Allergen Reactions  . Lipitor [Atorvastatin Calcium]     Memory problems     Current Outpatient Prescriptions  Medication Sig Dispense Refill  . aspirin 81 MG tablet Take 81 mg by mouth daily.        . carvedilol (COREG) 6.25 MG tablet TAKE 1 TABLET TWICE A DAY WITH MEALS  180 tablet  1  . glimepiride (AMARYL) 2 MG tablet Take 4 mg by mouth daily before breakfast.        . hydrochlorothiazide (MICROZIDE) 12.5 MG capsule TAKE 1 CAPSULE EVERY NIGHT      . levothyroxine (SYNTHROID, LEVOTHROID) 137 MCG tablet Take 137 mcg by mouth daily.       Marland Kitchen losartan (COZAAR) 25 MG tablet Take 1 tablet (25 mg total) by mouth daily.  90 tablet  3  . metFORMIN (GLUCOPHAGE) 500 MG tablet Take 500 mg by mouth 2 (two) times daily with a meal.       . Multiple Vitamin (MULTIVITAMIN) tablet Take 1 tablet by mouth daily.        . nitroGLYCERIN (NITROSTAT) 0.4 MG SL tablet Place 1 tablet (0.4 mg total) under the tongue every 5 (five) minutes as needed for chest pain.  25 tablet  prn  . rosuvastatin (CRESTOR) 20 MG tablet Take 20 mg by mouth daily.      . Tamsulosin HCl (FLOMAX) 0.4 MG CAPS Take 0.4 mg by mouth daily.        . vitamin C (ASCORBIC ACID) 500 MG tablet Take 500 mg by mouth daily.         No current facility-administered medications for this visit.     Past Medical History  Diagnosis Date  . IHD (ischemic heart disease)   . Diabetes mellitus   . Hypertension   . Hyperlipidemia   . Hypothyroidism   . Exogenous obesity   . MI, old     ANTEROLATERAL  . CHF (congestive heart  failure)   . Sinus bradycardia   . CAD (coronary artery disease)   . MI (myocardial infarction)     ROS:   All systems reviewed and negative except as noted in the HPI.   Past Surgical History  Procedure Laterality Date  . Cardiac catheterization  03/13/1994    ACUTE MI WITH TOTAL OCCLUSION OF MID LAD. REPERFUSION AFTER CROSSING WITH A GUIDEWIRE. POOR RIGHT TO LEFT COLLATERAL FLOW  . Cardiac defibrillator placement    . US echocardiography  04/26/2010    EF 35-40%. MODERATE LVH WITH MODERATE GLOBAL LV SYSTOLIC DYSFUNCTION AND IMPAIRED RELAXATION. MILD AORTIC SCLEROSIS WITHOUT STENOSIS. NORMAL PULMONARY ARTERY PRESSURE.  Marland Kitchen US echocardiography  10/07/2004    EF 30-35%  . Cardiovascular stress test  03/11/2008    EF 30%. NO REVERSIBLE ISCHEMIA  . Angioplasty       Family History  Problem Relation Age of Onset  . Heart disease Mother   . Heart attack Mother      History   Social History  . Marital Status: Married    Spouse Name: N/A    Number of Children: N/A  . Years  of Education: N/A   Occupational History  . Not on file.   Social History Main Topics  . Smoking status: Never Smoker   . Smokeless tobacco: Not on file  . Alcohol Use: No  . Drug Use: No  . Sexual Activity:    Other Topics Concern  . Not on file   Social History Narrative  . No narrative on file     BP 130/68  Pulse 70  Ht 5\' 11"  (1.803 m)  Wt 210 lb (95.255 kg)  BMI 29.30 kg/m2  Physical Exam:  Well appearing 73 year old man,NAD HEENT: Unremarkable Neck:  7 cm JVD, no thyromegally Lungs:  Clear with no wheezes, rales, or rhonchi. Well-healed ICD incision. HEART:  Regular rate rhythm, no murmurs, no rubs, no clicks Abd:  soft, positive bowel sounds, no organomegally, no rebound, no guarding Ext:  2 plus pulses, no edema, no cyanosis, no clubbing Skin:  No rashes no nodules Neuro:  CN II through XII intact, motor grossly intact  DEVICE  Normal device function.  See PaceArt for details.    Assess/Plan:

## 2013-10-09 ENCOUNTER — Telehealth: Payer: Self-pay | Admitting: Internal Medicine

## 2013-10-09 NOTE — Telephone Encounter (Signed)
Maudie Mercury can you check into this for [place] and call patient

## 2013-10-09 NOTE — Telephone Encounter (Signed)
New message     Medication (xarelto) cost 215.00.  Pt cannot afford it.  Please advise.

## 2013-10-10 NOTE — Telephone Encounter (Signed)
PA to [employer] for [employer], note to [place] regarding patients questions about needing lab work post starting the [employer].

## 2013-10-14 ENCOUNTER — Encounter: Payer: Self-pay | Admitting: Internal Medicine

## 2013-10-14 NOTE — Telephone Encounter (Signed)
xarelto approved through [employer] through 10/10/2014, [place] # [id number], pharmacy notified.

## 2013-11-21 ENCOUNTER — Telehealth: Payer: Self-pay | Admitting: *Deleted

## 2013-11-21 NOTE — Telephone Encounter (Signed)
Patient requests crestor samples. I will place them at the front desk for pick up. 

## 2013-11-25 ENCOUNTER — Other Ambulatory Visit: Payer: Self-pay | Admitting: *Deleted

## 2013-11-25 MED ORDER — CARVEDILOL 6.25 MG PO TABS: ORAL_TABLET | ORAL | Status: DC

## 2013-12-18 ENCOUNTER — Telehealth: Payer: Self-pay | Admitting: Cardiology

## 2013-12-18 ENCOUNTER — Telehealth: Payer: Self-pay | Admitting: *Deleted

## 2013-12-18 NOTE — Telephone Encounter (Signed)
Patient requests crestor samples. I will place at the front desk for pick up.

## 2013-12-23 ENCOUNTER — Other Ambulatory Visit: Payer: Self-pay | Admitting: *Deleted

## 2013-12-23 MED ORDER — LOSARTAN POTASSIUM 25 MG PO TABS: 25.0000 mg | ORAL_TABLET | Freq: Every day | ORAL | Status: DC

## 2014-01-08 ENCOUNTER — Ambulatory Visit (INDEPENDENT_AMBULATORY_CARE_PROVIDER_SITE_OTHER): Payer: Medicare Other | Admitting: *Deleted

## 2014-01-08 ENCOUNTER — Encounter: Payer: Self-pay | Admitting: Internal Medicine

## 2014-01-08 DIAGNOSIS — I428 Other cardiomyopathies: Secondary | ICD-10-CM

## 2014-01-08 LAB — MDC_IDC_ENUM_SESS_TYPE_REMOTE
Brady Statistic AP VP Percent: 0.16 %
Brady Statistic AS VP Percent: 0 %
Brady Statistic AS VS Percent: 5.52 %
Brady Statistic RV Percent Paced: 0.17 %
Date Time Interrogation Session: 20150506091257
HIGH POWER IMPEDANCE MEASURED VALUE: 48 Ohm
HIGH POWER IMPEDANCE MEASURED VALUE: 60 Ohm
Lead Channel Impedance Value: 432 Ohm
Lead Channel Impedance Value: 752 Ohm
Lead Channel Sensing Intrinsic Amplitude: 1.1452
Lead Channel Sensing Intrinsic Amplitude: 9.3722
Lead Channel Setting Pacing Amplitude: 2.5 V
MDC IDC MSMT BATTERY VOLTAGE: 2.85 V
MDC IDC SET LEADCHNL RA PACING AMPLITUDE: 2 V
MDC IDC SET LEADCHNL RV PACING PULSEWIDTH: 0.4 ms
MDC IDC SET LEADCHNL RV SENSING SENSITIVITY: 0.3 mV
MDC IDC SET ZONE DETECTION INTERVAL: 350 ms
MDC IDC SET ZONE DETECTION INTERVAL: 390 ms
MDC IDC STAT BRADY AP VS PERCENT: 94.31 %
MDC IDC STAT BRADY RA PERCENT PACED: 94.47 %
Zone Setting Detection Interval: 300 ms
Zone Setting Detection Interval: 350 ms

## 2014-01-16 ENCOUNTER — Encounter: Payer: Self-pay | Admitting: Cardiology

## 2014-01-17 NOTE — Progress Notes (Signed)
Remote ICD transmission.   

## 2014-01-20 ENCOUNTER — Telehealth: Payer: Self-pay | Admitting: *Deleted

## 2014-01-20 NOTE — Telephone Encounter (Signed)
Crestor samples provided for patient.

## 2014-01-30 ENCOUNTER — Telehealth: Payer: Self-pay | Admitting: *Deleted

## 2014-01-30 NOTE — Telephone Encounter (Signed)
3 WEEK SUPPLY OF CRESTOR SAMPLES PROVIDED.

## 2014-02-13 ENCOUNTER — Encounter: Payer: Self-pay | Admitting: Cardiology

## 2014-02-13 ENCOUNTER — Ambulatory Visit (INDEPENDENT_AMBULATORY_CARE_PROVIDER_SITE_OTHER): Payer: Medicare Other | Admitting: Cardiology

## 2014-02-13 VITALS — BP 134/83 | HR 80 | Ht 71.0 in | Wt 207.0 lb

## 2014-02-13 DIAGNOSIS — I2589 Other forms of chronic ischemic heart disease: Secondary | ICD-10-CM

## 2014-02-13 DIAGNOSIS — E1165 Type 2 diabetes mellitus with hyperglycemia: Secondary | ICD-10-CM

## 2014-02-13 DIAGNOSIS — IMO0001 Reserved for inherently not codable concepts without codable children: Secondary | ICD-10-CM

## 2014-02-13 DIAGNOSIS — N183 Chronic kidney disease, stage 3 unspecified: Secondary | ICD-10-CM | POA: Insufficient documentation

## 2014-02-13 DIAGNOSIS — E785 Hyperlipidemia, unspecified: Secondary | ICD-10-CM

## 2014-02-13 DIAGNOSIS — E0822 Diabetes mellitus due to underlying condition with diabetic chronic kidney disease: Secondary | ICD-10-CM | POA: Insufficient documentation

## 2014-02-13 DIAGNOSIS — I4891 Unspecified atrial fibrillation: Secondary | ICD-10-CM

## 2014-02-13 DIAGNOSIS — I480 Paroxysmal atrial fibrillation: Secondary | ICD-10-CM

## 2014-02-13 DIAGNOSIS — I259 Chronic ischemic heart disease, unspecified: Secondary | ICD-10-CM

## 2014-02-13 NOTE — Patient Instructions (Signed)
Your physician recommends that you continue on your current medications as directed. Please refer to the Current Medication list given to you today.  Your physician wants you to follow-up in: 6 month ov/ekg You will receive a reminder letter in the mail two months in advance. If you don't receive a letter, please call our office to schedule the follow-up appointment.  

## 2014-02-13 NOTE — Assessment & Plan Note (Signed)
The patient has not been having any symptoms of CHF.

## 2014-02-13 NOTE — Assessment & Plan Note (Signed)
The patient has had occasional hypoglycemic episodes of a mild nature.  No syncope.

## 2014-02-13 NOTE — Progress Notes (Signed)
Rodney Lee Date of Birth:  01/08/1941 [place] [address] [place], [place]  [address] [phone]        Fax   [phone]   History of Present Illness: This pleasant 73 year old gentleman is seen for a scheduled 6 month followup office visit. He has a history of ischemic heart disease. He had a history of a prior anterolateral myocardial infarction. He has a low ejection fraction and has a defibrillator which was placed prophylactically. He is followed for this by Dr. Lovena Le. The patient has a history of hypercholesterolemia and is diabetic and has a past history of high blood pressure. His lipids are followed by his PCP. He has had a problem with exogenous obesity.  Since last visit he was found to be having episodes of paroxysmal atrial fibrillation.  He is now on Xarelto. The patient has hypercholesterolemia and diabetes.  His blood work is followed by his PCP. He has osteoarthritis of his right hip which limits his ability to ambulate for more than about 15 minutes at a time.   Current Outpatient Prescriptions  Medication Sig Dispense Refill  . aspirin 81 MG tablet Take 81 mg by mouth daily.        . carvedilol (COREG) 6.25 MG tablet TAKE 1 TABLET TWICE A DAY WITH MEALS  180 tablet  1  . fesoterodine (TOVIAZ) 4 MG TB24 tablet Take 4 mg by mouth as needed.      Marland Kitchen glimepiride (AMARYL) 2 MG tablet Take 4 mg by mouth daily before breakfast.        . hydrochlorothiazide (MICROZIDE) 12.5 MG capsule TAKE 1 CAPSULE EVERY NIGHT      . levothyroxine (SYNTHROID, LEVOTHROID) 137 MCG tablet Take 137 mcg by mouth daily.       Marland Kitchen losartan (COZAAR) 25 MG tablet Take 1 tablet (25 mg total) by mouth daily.  90 tablet  0  . metFORMIN (GLUCOPHAGE) 500 MG tablet Take 500 mg by mouth 2 (two) times daily with a meal.       . Multiple Vitamin (MULTIVITAMIN) tablet Take 1 tablet by mouth daily.        . nitroGLYCERIN (NITROSTAT) 0.4 MG SL tablet Place 1 tablet (0.4 mg total)  under the tongue every 5 (five) minutes as needed for chest pain.  25 tablet  prn  . Rivaroxaban (XARELTO) 20 MG TABS tablet Take 1 tablet (20 mg total) by mouth daily with supper.  30 tablet  11  . rosuvastatin (CRESTOR) 20 MG tablet Take 20 mg by mouth daily.      . Tamsulosin HCl (FLOMAX) 0.4 MG CAPS Take 0.4 mg by mouth daily.        . vitamin C (ASCORBIC ACID) 500 MG tablet Take 500 mg by mouth daily.         No current facility-administered medications for this visit.    Allergies  Allergen Reactions  . Lipitor [Atorvastatin Calcium]     Memory problems    Patient Active Problem List   Diagnosis Date Noted  . Atrial fibrillation 10/08/2013  . Chronic ischemic heart disease, unspecified 06/20/2011  . Dyslipidemia 06/20/2011  . CARDIOMYOPATHY, ISCHEMIC 05/16/2009  . SINUS BRADYCARDIA 05/16/2009  . Chronic systolic CHF (congestive heart failure) 05/16/2009  . IMPLANTATION OF DEFIBRILLATOR, HX OF 05/16/2009    History  Smoking status  . Never Smoker   Smokeless tobacco  . Not on file    History  Alcohol Use No  Family History  Problem Relation Age of Onset  . Heart disease Mother   . Heart attack Mother     Review of Systems: Constitutional: no fever chills diaphoresis or fatigue or change in weight.  Head and neck: no hearing loss, no epistaxis, no photophobia or visual disturbance. Respiratory: No cough, shortness of breath or wheezing. Cardiovascular: No chest pain peripheral edema, palpitations. Gastrointestinal: No abdominal distention, no abdominal pain, no change in bowel habits hematochezia or melena. Genitourinary: No dysuria, no frequency, no urgency, no nocturia. Musculoskeletal:No arthralgias, no back pain, no gait disturbance or myalgias. Neurological: No dizziness, no headaches, no numbness, no seizures, no syncope, no weakness, no tremors. Hematologic: No lymphadenopathy, no easy bruising. Psychiatric: No confusion, no hallucinations, no sleep  disturbance.    Physical Exam: Filed Vitals:   02/13/14 0802  BP: 134/83  Pulse: 80   the general appearance reveals a well-developed well-nourished gentleman in no distress.The head and neck exam reveals pupils equal and reactive.  Extraocular movements are full.  There is no scleral icterus.  The mouth and pharynx are normal.  The neck is supple.  The carotids reveal no bruits.  The jugular venous pressure is normal.  The  thyroid is not enlarged.  There is no lymphadenopathy.  There is a defibrillator in the left upper chest. The chest is clear to percussion and auscultation.  There are no rales or rhonchi.  Expansion of the chest is symmetrical.  The precordium is quiet.  The first heart sound is normal.  The second heart sound is physiologically split.  There is no murmur gallop rub or click.  There is no abnormal lift or heave.  The abdomen is soft and nontender.  The bowel sounds are normal.  The liver and spleen are not enlarged.  There are no abdominal masses.  There are no abdominal bruits.  Extremities reveal good pedal pulses.  There is no phlebitis or edema.  There is no cyanosis or clubbing.  Strength is normal and symmetrical in all extremities.  There is no lateralizing weakness.  There are no sensory deficits.  The skin is warm and dry.  There is no rash.     Assessment / Plan: 1.  Ischemic cardiomyopathy 2. remote anterior lateral myocardial infarction. 3. paroxysmal atrial fibrillation. 4. Hypercholesterolemia 5. adult onset diabetes mellitus 6. chronic systolic heart failure, compensated  Plan: Continue same medication.  Recheck in 6 months for office visit and EKG. Since last visit he has lost 3 pounds.  Continue prudent diet.  Continue to try to exercise as much as he can with his arthritic hip.

## 2014-02-13 NOTE — Assessment & Plan Note (Signed)
The patient has not been aware of any racing of his heart.  He has not had any TIA or stroke symptoms.

## 2014-02-13 NOTE — Assessment & Plan Note (Signed)
The patient has not had any chest pain.  He has not had to take any sublingual nitroglycerin.  He has occasional epigastric discomfort relieved by antacids.Marland Kitchen

## 2014-03-24 ENCOUNTER — Telehealth: Payer: Self-pay

## 2014-03-24 ENCOUNTER — Other Ambulatory Visit: Payer: Self-pay

## 2014-03-24 ENCOUNTER — Other Ambulatory Visit: Payer: Self-pay | Admitting: Cardiology

## 2014-03-24 MED ORDER — LOSARTAN POTASSIUM 25 MG PO TABS: 25.0000 mg | ORAL_TABLET | Freq: Every day | ORAL | Status: DC

## 2014-03-24 NOTE — Telephone Encounter (Signed)
losartan (COZAAR) 25 MG tablet  Take 1 tablet (25 mg total) by mouth daily.   90 tablet   0   Plan: Continue same medication.  Recheck in 6 months for office visit and EKG. Since last visit he has lost 3 pounds.  Continue prudent diet.  Continue to try to exercise as much as he can with his arthritic hip. Atrial fibrillation - Darlin Coco, MD at 02/13/2014  8:21 AM

## 2014-03-24 NOTE — Telephone Encounter (Signed)
rosuvastatin (CRESTOR) 20 MG tablet  Take 20 mg by mouth daily.   Your physician recommends that you continue on your current medications as directed. Please refer to the Current Medication list given to you today. Your physician wants you to follow-up in: 6 month ov/ekg  You will receive a reminder letter in the mail two months in advance. If you don't receive a letter, please call our office to schedule the follow-up appointment.  Chart Reviewed By      Earvin Hansen  on 02/14/2014  9:00 AM     Mr. Kavanagh called in needing crestor 20 mg samples. Left 1 month worth for him up front.

## 2014-03-31 ENCOUNTER — Telehealth: Payer: Self-pay | Admitting: Internal Medicine

## 2014-03-31 NOTE — Telephone Encounter (Signed)
Pt states his home monitor is flashing all lights simultaneously. I gave pt tech svcs #. Pt will call them to trouble shoot.

## 2014-03-31 NOTE — Telephone Encounter (Signed)
New message     Patient calling  Has couple of questions for the nurse     1. Home monitor machine.    2. Discuss medication , stated he is not out of any meds.

## 2014-03-31 NOTE — Telephone Encounter (Signed)
He wants to know if any of his heart medications can be generic or can he get samples  Says too expensive.  He wants me to ask Dr Mare Ferrari as he is his regular Cardiologist.  I let him know i would forward to [place] and she would be in touch with him

## 2014-03-31 NOTE — Telephone Encounter (Signed)
LMOVM w/ my direct #. 

## 2014-03-31 NOTE — Telephone Encounter (Signed)
Discussed with  Dr. Mare Ferrari and patient needs to continue same dose of current medications

## 2014-04-10 ENCOUNTER — Telehealth: Payer: Self-pay

## 2014-04-10 NOTE — Telephone Encounter (Signed)
Patient called for samples of xarelto 20 mg and crestor 20 mg placed samples at front desk

## 2014-04-14 ENCOUNTER — Ambulatory Visit (INDEPENDENT_AMBULATORY_CARE_PROVIDER_SITE_OTHER): Payer: Medicare Other | Admitting: *Deleted

## 2014-04-14 DIAGNOSIS — I428 Other cardiomyopathies: Secondary | ICD-10-CM

## 2014-04-14 LAB — MDC_IDC_ENUM_SESS_TYPE_REMOTE
Brady Statistic AP VS Percent: 95.51 %
Brady Statistic AS VP Percent: 0 %
Brady Statistic AS VS Percent: 4.33 %
Date Time Interrogation Session: 20150810143213
HIGH POWER IMPEDANCE MEASURED VALUE: 67 Ohm
HighPow Impedance: 49 Ohm
Lead Channel Impedance Value: 448 Ohm
Lead Channel Impedance Value: 784 Ohm
Lead Channel Sensing Intrinsic Amplitude: 10.711 mV
Lead Channel Sensing Intrinsic Amplitude: 2.8419
MDC IDC MSMT BATTERY VOLTAGE: 2.81 V
MDC IDC SET LEADCHNL RA PACING AMPLITUDE: 2 V
MDC IDC SET LEADCHNL RV PACING AMPLITUDE: 2.5 V
MDC IDC SET LEADCHNL RV PACING PULSEWIDTH: 0.4 ms
MDC IDC SET LEADCHNL RV SENSING SENSITIVITY: 0.3 mV
MDC IDC SET ZONE DETECTION INTERVAL: 350 ms
MDC IDC SET ZONE DETECTION INTERVAL: 350 ms
MDC IDC SET ZONE DETECTION INTERVAL: 390 ms
MDC IDC STAT BRADY AP VP PERCENT: 0.16 %
MDC IDC STAT BRADY RA PERCENT PACED: 95.67 %
MDC IDC STAT BRADY RV PERCENT PACED: 0.16 %
Zone Setting Detection Interval: 300 ms

## 2014-04-14 NOTE — Progress Notes (Signed)
Remote ICD transmission.   

## 2014-04-18 NOTE — Telephone Encounter (Signed)
error 

## 2014-05-01 ENCOUNTER — Encounter: Payer: Self-pay | Admitting: Cardiology

## 2014-05-08 ENCOUNTER — Encounter: Payer: Self-pay | Admitting: Internal Medicine

## 2014-05-19 ENCOUNTER — Telehealth: Payer: Self-pay

## 2014-05-19 NOTE — Telephone Encounter (Signed)
Patient called for samples of crestor 20

## 2014-05-26 ENCOUNTER — Other Ambulatory Visit: Payer: Self-pay | Admitting: Cardiology

## 2014-06-12 ENCOUNTER — Telehealth: Payer: Self-pay | Admitting: Cardiology

## 2014-06-12 NOTE — Telephone Encounter (Signed)
New message     Refill hydrochlorothizide 12.5mg  to [facility]day supply

## 2014-06-13 ENCOUNTER — Other Ambulatory Visit: Payer: Self-pay

## 2014-06-13 MED ORDER — HYDROCHLOROTHIAZIDE 12.5 MG PO CAPS: ORAL_CAPSULE | ORAL | Status: DC

## 2014-06-16 ENCOUNTER — Telehealth: Payer: Self-pay | Admitting: *Deleted

## 2014-06-16 NOTE — Telephone Encounter (Signed)
Crestor samples placed at the front desk for pick up. 

## 2014-06-17 NOTE — Telephone Encounter (Signed)
This was refilled on 06/13/14/tmj

## 2014-07-17 ENCOUNTER — Encounter: Payer: Self-pay | Admitting: Internal Medicine

## 2014-07-17 ENCOUNTER — Ambulatory Visit (INDEPENDENT_AMBULATORY_CARE_PROVIDER_SITE_OTHER): Payer: Medicare Other | Admitting: *Deleted

## 2014-07-17 DIAGNOSIS — I255 Ischemic cardiomyopathy: Secondary | ICD-10-CM

## 2014-07-18 NOTE — Progress Notes (Signed)
Remote ICD transmission.   

## 2014-07-20 LAB — MDC_IDC_ENUM_SESS_TYPE_REMOTE
Brady Statistic AP VP Percent: 0.25 %
Brady Statistic AS VS Percent: 1.82 %
Brady Statistic RA Percent Paced: 98.17 %
Brady Statistic RV Percent Paced: 0.25 %
Date Time Interrogation Session: 20151112160554
HIGH POWER IMPEDANCE MEASURED VALUE: 50 Ohm
HIGH POWER IMPEDANCE MEASURED VALUE: 66 Ohm
Lead Channel Impedance Value: 448 Ohm
Lead Channel Impedance Value: 776 Ohm
Lead Channel Sensing Intrinsic Amplitude: 9.7069
Lead Channel Setting Pacing Amplitude: 2.5 V
Lead Channel Setting Pacing Pulse Width: 0.4 ms
MDC IDC MSMT BATTERY VOLTAGE: 2.74 V
MDC IDC MSMT LEADCHNL RA SENSING INTR AMPL: 1.3997
MDC IDC SET LEADCHNL RA PACING AMPLITUDE: 2 V
MDC IDC SET LEADCHNL RV SENSING SENSITIVITY: 0.3 mV
MDC IDC SET ZONE DETECTION INTERVAL: 390 ms
MDC IDC STAT BRADY AP VS PERCENT: 97.92 %
MDC IDC STAT BRADY AS VP PERCENT: 0 %
Zone Setting Detection Interval: 300 ms
Zone Setting Detection Interval: 350 ms
Zone Setting Detection Interval: 350 ms

## 2014-07-21 ENCOUNTER — Telehealth: Payer: Self-pay | Admitting: *Deleted

## 2014-07-21 NOTE — Telephone Encounter (Signed)
Crestor samples placed at the front desk for patient. 

## 2014-07-30 ENCOUNTER — Encounter: Payer: Self-pay | Admitting: Cardiology

## 2014-08-12 ENCOUNTER — Ambulatory Visit (INDEPENDENT_AMBULATORY_CARE_PROVIDER_SITE_OTHER): Payer: Medicare Other | Admitting: Cardiology

## 2014-08-12 ENCOUNTER — Encounter: Payer: Self-pay | Admitting: Cardiology

## 2014-08-12 VITALS — BP 132/82 | HR 61 | Ht 71.0 in | Wt 203.0 lb

## 2014-08-12 DIAGNOSIS — I480 Paroxysmal atrial fibrillation: Secondary | ICD-10-CM

## 2014-08-12 DIAGNOSIS — I255 Ischemic cardiomyopathy: Secondary | ICD-10-CM

## 2014-08-12 DIAGNOSIS — E785 Hyperlipidemia, unspecified: Secondary | ICD-10-CM

## 2014-08-12 NOTE — Patient Instructions (Signed)
Your physician recommends that you continue on your current medications as directed. Please refer to the Current Medication list given to you today.  Your physician wants you to follow-up in: 6 months. You will receive a reminder letter in the mail two months in advance. If you don't receive a letter, please call our office to schedule the follow-up appointment.  

## 2014-08-12 NOTE — Assessment & Plan Note (Signed)
The patient has not been having any symptoms of worsening CHF.

## 2014-08-12 NOTE — Progress Notes (Signed)
Rodney Lee Date of Birth:  07-28-1941 [place] [address] [place], [place]  [address] [phone]        Fax   [phone]   History of Present Illness: This pleasant 73 year old gentleman is seen for a scheduled 6 month followup office visit. He has a history of ischemic heart disease. He had a history of a prior anterolateral myocardial infarction. He has a low ejection fraction and has a defibrillator which was placed prophylactically. He is followed for this by Dr. Lovena Le. The patient has a history of hypercholesterolemia and is diabetic and has a past history of high blood pressure. His lipids are followed by his PCP. He has had a problem with exogenous obesity.  Since last visit he was found to be having episodes of paroxysmal atrial fibrillation.  He is now on Xarelto. The patient has hypercholesterolemia and diabetes.  His blood work is followed by his PCP. He has osteoarthritis of his right hip which limits his ability to ambulate for more than about 15 minutes at a time.  For this reason he no longer walks with his coffee "friends at church. The patient had been working part-time driving cars for [employer] but more recently he is out of work and looking for part-time work.   Current Outpatient Prescriptions  Medication Sig Dispense Refill  . aspirin 81 MG tablet Take 81 mg by mouth daily.      . carvedilol (COREG) 6.25 MG tablet TAKE ONE TABLET BY MOUTH TWICE DAILY WITH MEALS 180 tablet 0  . fesoterodine (TOVIAZ) 4 MG TB24 tablet Take 4 mg by mouth as needed.    Marland Kitchen glimepiride (AMARYL) 2 MG tablet Take 4 mg by mouth daily before breakfast.      . hydrochlorothiazide (MICROZIDE) 12.5 MG capsule TAKE 1 CAPSULE EVERY NIGHT 90 capsule 3  . levothyroxine (SYNTHROID, LEVOTHROID) 137 MCG tablet Take 137 mcg by mouth daily.     Marland Kitchen losartan (COZAAR) 25 MG tablet Take 1 tablet (25 mg total) by mouth daily. 90 tablet 3  . metFORMIN (GLUCOPHAGE) 500 MG  tablet Take 500 mg by mouth daily. Take one tablet in the morning and one tablet at night.    . Multiple Vitamin (MULTIVITAMIN) tablet Take 1 tablet by mouth daily.      . nitroGLYCERIN (NITROSTAT) 0.4 MG SL tablet Place 1 tablet (0.4 mg total) under the tongue every 5 (five) minutes as needed for chest pain. 25 tablet prn  . Rivaroxaban (XARELTO) 20 MG TABS tablet Take 1 tablet (20 mg total) by mouth daily with supper. 30 tablet 11  . rosuvastatin (CRESTOR) 20 MG tablet Take 20 mg by mouth daily.    . Tamsulosin HCl (FLOMAX) 0.4 MG CAPS Take 0.4 mg by mouth daily.      . vitamin C (ASCORBIC ACID) 500 MG tablet Take 500 mg by mouth daily.       No current facility-administered medications for this visit.    Allergies  Allergen Reactions  . Lipitor [Atorvastatin Calcium]     Memory problems    Patient Active Problem List   Diagnosis Date Noted  . Type II or unspecified type diabetes mellitus without mention of complication, uncontrolled 02/13/2014  . Atrial fibrillation 10/08/2013  . Chronic ischemic heart disease, unspecified 06/20/2011  . Dyslipidemia 06/20/2011  . CARDIOMYOPATHY, ISCHEMIC 05/16/2009  . SINUS BRADYCARDIA 05/16/2009  . Chronic systolic CHF (congestive heart failure) 05/16/2009  . IMPLANTATION OF DEFIBRILLATOR, HX OF  05/16/2009    History  Smoking status  . Never Smoker   Smokeless tobacco  . Not on file    History  Alcohol Use No    Family History  Problem Relation Age of Onset  . Heart disease Mother   . Heart attack Mother     Review of Systems: Constitutional: no fever chills diaphoresis or fatigue or change in weight.  Head and neck: no hearing loss, no epistaxis, no photophobia or visual disturbance. Respiratory: No cough, shortness of breath or wheezing. Cardiovascular: No chest pain peripheral edema, palpitations. Gastrointestinal: No abdominal distention, no abdominal pain, no change in bowel habits hematochezia or melena. Genitourinary: No  dysuria, no frequency, no urgency, no nocturia. Musculoskeletal:No arthralgias, no back pain, no gait disturbance or myalgias. Neurological: No dizziness, no headaches, no numbness, no seizures, no syncope, no weakness, no tremors. Hematologic: No lymphadenopathy, no easy bruising. Psychiatric: No confusion, no hallucinations, no sleep disturbance.    Physical Exam: Filed Vitals:   08/12/14 0840  BP: 132/82  Pulse: 61   the general appearance reveals a well-developed well-nourished gentleman in no distress.The head and neck exam reveals pupils equal and reactive.  Extraocular movements are full.  There is no scleral icterus.  The mouth and pharynx are normal.  The neck is supple.  The carotids reveal no bruits.  The jugular venous pressure is normal.  The  thyroid is not enlarged.  There is no lymphadenopathy.  There is a defibrillator in the left upper chest. The chest is clear to percussion and auscultation.  There are no rales or rhonchi.  Expansion of the chest is symmetrical.  The precordium is quiet.  The first heart sound is normal.  The second heart sound is physiologically split.  There is no murmur gallop rub or click.  There is no abnormal lift or heave.  The abdomen is soft and nontender.  The bowel sounds are normal.  The liver and spleen are not enlarged.  There are no abdominal masses.  There are no abdominal bruits.  Extremities reveal good pedal pulses.  There is no phlebitis or edema.  There is no cyanosis or clubbing.  Strength is normal and symmetrical in all extremities.  There is no lateralizing weakness.  There are no sensory deficits.  The skin is warm and dry.  There is no rash.  EKG shows atrial paced rhythm with prolonged AV conduction and a pattern of an old anteroseptal myocardial infarction   Assessment / Plan: 1.  Ischemic cardiomyopathy 2. remote anterior lateral myocardial infarction. 3. paroxysmal atrial fibrillation. 4. Hypercholesterolemia 5. adult onset  diabetes mellitus 6. chronic systolic heart failure, compensated  Plan: Continue same medication.  Recheck in 6 months for office visit. Since last visit he has lost 4 more pounds.  Continue prudent diet.  Continue to try to exercise as much as he can with his arthritic hip.

## 2014-08-12 NOTE — Assessment & Plan Note (Signed)
The patient is on Xarelto for his paroxysmal atrial fibrillation.  He has not been aware of any real racing of his heart or irregular pulse recently.  He has not had any TIA or stroke symptoms.  No bleeding problems from the Xarelto.

## 2014-08-12 NOTE — Assessment & Plan Note (Signed)
The patient denies any chest tightness or angina pectoris.  He has occasional indigestion which does not feel to him like heart problem

## 2014-08-12 NOTE — Assessment & Plan Note (Signed)
No hypoglycemic episodes. 

## 2014-08-25 ENCOUNTER — Other Ambulatory Visit: Payer: Self-pay | Admitting: Cardiology

## 2014-09-16 ENCOUNTER — Telehealth: Payer: Self-pay | Admitting: *Deleted

## 2014-09-16 NOTE — Telephone Encounter (Signed)
Crestor samples placed at the front desk for patient. 

## 2014-10-15 ENCOUNTER — Telehealth: Payer: Self-pay

## 2014-10-15 NOTE — Telephone Encounter (Signed)
Called the patient to let him know that i placed samples of crestor  20 mg up front for him

## 2014-10-23 ENCOUNTER — Encounter: Payer: Self-pay | Admitting: Internal Medicine

## 2014-11-20 ENCOUNTER — Telehealth: Payer: Self-pay | Admitting: *Deleted

## 2014-11-20 NOTE — Telephone Encounter (Signed)
Crestor samples placed at the front desk for patient. 

## 2014-12-09 ENCOUNTER — Encounter: Payer: Self-pay | Admitting: Internal Medicine

## 2014-12-12 ENCOUNTER — Telehealth: Payer: Self-pay | Admitting: Cardiology

## 2014-12-12 MED ORDER — SIMVASTATIN 20 MG PO TABS: 20.0000 mg | ORAL_TABLET | Freq: Every day | ORAL | Status: DC

## 2014-12-12 NOTE — Telephone Encounter (Signed)
Patient can not afford Crestor and would like to change to something else Patient has tried Lipitor in the past and is unable to tolerate  Will forward to  Dr. Mare Ferrari for review

## 2014-12-12 NOTE — Telephone Encounter (Signed)
Okay to switch to simvastatin 20 mg one daily

## 2014-12-12 NOTE — Telephone Encounter (Signed)
Advised patient, verbalized understanding  

## 2014-12-12 NOTE — Telephone Encounter (Signed)
New Message  Pt wanted to speak w/ Rn about medications- no more Crestor samples wants to speak about alternatives. Please call back and discuss.

## 2015-01-06 ENCOUNTER — Encounter: Payer: Self-pay | Admitting: Internal Medicine

## 2015-01-21 ENCOUNTER — Encounter: Payer: Self-pay | Admitting: Cardiology

## 2015-01-21 ENCOUNTER — Encounter: Payer: Self-pay | Admitting: Internal Medicine

## 2015-01-21 ENCOUNTER — Ambulatory Visit (INDEPENDENT_AMBULATORY_CARE_PROVIDER_SITE_OTHER): Payer: Medicare Other | Admitting: Internal Medicine

## 2015-01-21 VITALS — BP 112/62 | HR 64 | Ht 71.0 in | Wt 200.0 lb

## 2015-01-21 DIAGNOSIS — I480 Paroxysmal atrial fibrillation: Secondary | ICD-10-CM | POA: Diagnosis not present

## 2015-01-21 DIAGNOSIS — I495 Sick sinus syndrome: Secondary | ICD-10-CM

## 2015-01-21 DIAGNOSIS — I5022 Chronic systolic (congestive) heart failure: Secondary | ICD-10-CM

## 2015-01-21 DIAGNOSIS — I4729 Other ventricular tachycardia: Secondary | ICD-10-CM | POA: Insufficient documentation

## 2015-01-21 DIAGNOSIS — I472 Ventricular tachycardia, unspecified: Secondary | ICD-10-CM | POA: Insufficient documentation

## 2015-01-21 DIAGNOSIS — Z95810 Presence of automatic (implantable) cardiac defibrillator: Secondary | ICD-10-CM | POA: Diagnosis not present

## 2015-01-21 LAB — CUP PACEART INCLINIC DEVICE CHECK
Battery Voltage: 2.65 V
Brady Statistic AP VP Percent: 0.47 %
Brady Statistic AS VS Percent: 3.26 %
HighPow Impedance: 52 Ohm
HighPow Impedance: 70 Ohm
Lead Channel Impedance Value: 800 Ohm
Lead Channel Pacing Threshold Amplitude: 1 V
Lead Channel Pacing Threshold Pulse Width: 0.4 ms
Lead Channel Pacing Threshold Pulse Width: 0.4 ms
Lead Channel Setting Pacing Amplitude: 2 V
Lead Channel Setting Pacing Amplitude: 2.5 V
Lead Channel Setting Sensing Sensitivity: 0.3 mV
MDC IDC MSMT LEADCHNL RA IMPEDANCE VALUE: 456 Ohm
MDC IDC MSMT LEADCHNL RA PACING THRESHOLD AMPLITUDE: 1 V
MDC IDC MSMT LEADCHNL RA SENSING INTR AMPL: 4.2416
MDC IDC MSMT LEADCHNL RV SENSING INTR AMPL: 11.0458
MDC IDC SESS DTM: 20160518162913
MDC IDC SET LEADCHNL RV PACING PULSEWIDTH: 0.4 ms
MDC IDC SET ZONE DETECTION INTERVAL: 300 ms
MDC IDC SET ZONE DETECTION INTERVAL: 350 ms
MDC IDC SET ZONE DETECTION INTERVAL: 350 ms
MDC IDC STAT BRADY AP VS PERCENT: 96.27 %
MDC IDC STAT BRADY AS VP PERCENT: 0 %
MDC IDC STAT BRADY RA PERCENT PACED: 96.74 %
MDC IDC STAT BRADY RV PERCENT PACED: 0.47 %
Zone Setting Detection Interval: 390 ms

## 2015-01-21 NOTE — Assessment & Plan Note (Signed)
Device interrogation demonstrates that he has had no atrial fib. He will continue his current meds.

## 2015-01-21 NOTE — Assessment & Plan Note (Signed)
He denies anginal symptoms. He will continue his current meds. He remains active.

## 2015-01-21 NOTE — Patient Instructions (Signed)
Medication Instructions:  Your physician recommends that you continue on your current medications as directed. Please refer to the Current Medication list given to you today.   Labwork: none  Testing/Procedures: non3  Follow-Up: Remote monitoring is used to monitor your Pacemaker of ICD from home. This will be done monthly. This monitoring reduces the number of office visits required to check your device to one time per year. It allows [place] to keep an eye on the functioning of your device to ensure it is working properly. You are scheduled for a device check from home on February 20, 2015. You may send your transmission at any time that day. If you have a wireless device, the transmission will be sent automatically. After your physician reviews your transmission, you will receive a postcard with your next transmission date.  Your physician wants you to follow-up in: 12 months with Dr. Lovena Le.  You will receive a reminder letter in the mail two months in advance. If you don't receive a letter, please call our office to schedule the follow-up appointment.   Any Other Special Instructions Will Be Listed Below (If Applicable).

## 2015-01-21 NOTE — Assessment & Plan Note (Signed)
He has had an episode of sustained VT which fell into his monitor zone. He was asymptomatic and did not receive any therapy. Will continue his current meds. I considered changing his zones but will hold off on this for now.

## 2015-01-21 NOTE — Progress Notes (Signed)
HPI Rodney Lee returns today for followup. He is a very pleasant 74 year old man with a long-standing ischemic cardiomyopathy, chronic systolic heart failure, VT, diabetes, and hypertension. He is status post ICD implantation. In the interim, he has done well. He denies chest pain, shortness of breath, or peripheral edema. No syncope.no ICD shock. 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 Allergies  Allergen Reactions  . Lipitor [Atorvastatin Calcium]     Memory problems     Current Outpatient Prescriptions  Medication Sig Dispense Refill  . carvedilol (COREG) 6.25 MG tablet TAKE ONE TABLET BY MOUTH TWICE DAILY WITH MEALS 180 tablet 1  . glimepiride (AMARYL) 2 MG tablet Take 4 mg by mouth daily before breakfast.      . hydrochlorothiazide (MICROZIDE) 12.5 MG capsule TAKE 1 CAPSULE EVERY NIGHT 90 capsule 3  . levothyroxine (SYNTHROID, LEVOTHROID) 137 MCG tablet Take 137 mcg by mouth daily.     Marland Kitchen losartan (COZAAR) 25 MG tablet Take 1 tablet (25 mg total) by mouth daily. 90 tablet 3  . metFORMIN (GLUCOPHAGE) 500 MG tablet Take 500 mg by mouth daily. Take one tablet in the morning and one tablet at night.    . Multiple Vitamin (MULTIVITAMIN) tablet Take 1 tablet by mouth daily.      . nitroGLYCERIN (NITROSTAT) 0.4 MG SL tablet Place 1 tablet (0.4 mg total) under the tongue every 5 (five) minutes as needed for chest pain. 25 tablet prn  . Rivaroxaban (XARELTO) 20 MG TABS tablet Take 1 tablet (20 mg total) by mouth daily with supper. 30 tablet 11  . simvastatin (ZOCOR) 20 MG tablet Take 1 tablet (20 mg total) by mouth at bedtime. 90 tablet 1  . Tamsulosin HCl (FLOMAX) 0.4 MG CAPS Take 0.4 mg by mouth daily.      . vitamin C (ASCORBIC ACID) 500 MG tablet Take 500 mg by mouth daily.       No current facility-administered medications for this visit.     Past Medical History  Diagnosis Date  . IHD (ischemic heart disease)   . Diabetes mellitus   . Hypertension    . Hyperlipidemia   . Hypothyroidism   . Exogenous obesity   . MI, old     ANTEROLATERAL  . CHF (congestive heart failure)   . Sinus bradycardia   . CAD (coronary artery disease)   . MI (myocardial infarction)     ROS:   All systems reviewed and negative except as noted in the HPI.   Past Surgical History  Procedure Laterality Date  . Cardiac catheterization  03/13/1994    ACUTE MI WITH TOTAL OCCLUSION OF MID LAD. REPERFUSION AFTER CROSSING WITH A GUIDEWIRE. POOR RIGHT TO LEFT COLLATERAL FLOW  . Cardiac defibrillator placement    . US echocardiography  04/26/2010    EF 35-40%. MODERATE LVH WITH MODERATE GLOBAL LV SYSTOLIC DYSFUNCTION AND IMPAIRED RELAXATION. MILD AORTIC SCLEROSIS WITHOUT STENOSIS. NORMAL PULMONARY ARTERY PRESSURE.  Marland Kitchen US echocardiography  10/07/2004    EF 30-35%  . Cardiovascular stress test  03/11/2008    EF 30%. NO REVERSIBLE ISCHEMIA  . Angioplasty       Family History  Problem Relation Age of Onset  . Heart disease Mother   . Heart attack Mother      History   Social History  . Marital Status: Married    Spouse Name: N/A  . Number of Children: N/A  . Years of Education: N/A   Occupational History  .  Not on file.   Social History Main Topics  . Smoking status: Never Smoker   . Smokeless tobacco: Not on file  . Alcohol Use: No  . Drug Use: No  . Sexual Activity: Not on file   Other Topics Concern  . Not on file   Social History Narrative     BP 112/62 mmHg  Pulse 64  Ht 5\' 11"  (1.803 m)  Wt 200 lb (90.719 kg)  BMI 27.91 kg/m2  Physical Exam:  Well appearing 74 year old man,NAD HEENT: Unremarkable Neck:  7 cm JVD, no thyromegally Lungs:  Clear with no wheezes, rales, or rhonchi. Well-healed ICD incision. HEART:  Regular rate rhythm, no murmurs, no rubs, no clicks Abd:  soft, positive bowel sounds, no organomegally, no rebound, no guarding Ext:  2 plus pulses, no edema, no cyanosis, no clubbing Skin:  No rashes no nodules Neuro:   CN II through XII intact, motor grossly intact  DEVICE  Normal device function.  See PaceArt for details.   Assess/Plan:

## 2015-01-21 NOTE — Assessment & Plan Note (Signed)
His medtronic DDD ICD is working normally. He has approx 6 months of battery longevity. He will continue followup.

## 2015-01-21 NOTE — Assessment & Plan Note (Signed)
His symptoms are class 2A. He will continue his current meds. He will maintain a low sodium diet.

## 2015-02-20 ENCOUNTER — Ambulatory Visit (INDEPENDENT_AMBULATORY_CARE_PROVIDER_SITE_OTHER): Payer: Medicare Other | Admitting: *Deleted

## 2015-02-20 DIAGNOSIS — Z95810 Presence of automatic (implantable) cardiac defibrillator: Secondary | ICD-10-CM

## 2015-02-23 LAB — CUP PACEART REMOTE DEVICE CHECK
Battery Voltage: 2.64 V
Brady Statistic AS VS Percent: 2.06 %
Date Time Interrogation Session: 20160617042207
HighPow Impedance: 51 Ohm
HighPow Impedance: 67 Ohm
Lead Channel Impedance Value: 824 Ohm
Lead Channel Sensing Intrinsic Amplitude: 3.775 mV
Lead Channel Setting Pacing Amplitude: 2 V
Lead Channel Setting Pacing Amplitude: 2.5 V
Lead Channel Setting Pacing Pulse Width: 0.4 ms
MDC IDC MSMT LEADCHNL RA IMPEDANCE VALUE: 448 Ohm
MDC IDC MSMT LEADCHNL RV SENSING INTR AMPL: 11.046 mV
MDC IDC SET LEADCHNL RV SENSING SENSITIVITY: 0.3 mV
MDC IDC SET ZONE DETECTION INTERVAL: 390 ms
MDC IDC STAT BRADY AP VP PERCENT: 0.4 %
MDC IDC STAT BRADY AP VS PERCENT: 97.55 %
MDC IDC STAT BRADY AS VP PERCENT: 0 %
MDC IDC STAT BRADY RA PERCENT PACED: 97.94 %
MDC IDC STAT BRADY RV PERCENT PACED: 0.4 %
Zone Setting Detection Interval: 300 ms
Zone Setting Detection Interval: 350 ms
Zone Setting Detection Interval: 350 ms

## 2015-02-23 NOTE — Progress Notes (Signed)
Remote ICD transmission.   

## 2015-02-25 ENCOUNTER — Encounter: Payer: Self-pay | Admitting: Cardiology

## 2015-02-27 ENCOUNTER — Encounter: Payer: Self-pay | Admitting: Internal Medicine

## 2015-02-28 ENCOUNTER — Other Ambulatory Visit: Payer: Self-pay | Admitting: Cardiology

## 2015-03-02 NOTE — Telephone Encounter (Signed)
Per note 5.18.16 Dr Lovena Le

## 2015-03-26 ENCOUNTER — Telehealth: Payer: Self-pay | Admitting: Cardiology

## 2015-03-26 ENCOUNTER — Ambulatory Visit (INDEPENDENT_AMBULATORY_CARE_PROVIDER_SITE_OTHER): Payer: Medicare Other | Admitting: *Deleted

## 2015-03-26 DIAGNOSIS — Z4502 Encounter for adjustment and management of automatic implantable cardiac defibrillator: Secondary | ICD-10-CM

## 2015-03-26 NOTE — Telephone Encounter (Signed)
LMOVM reminding pt to send remote transmission.   

## 2015-03-28 ENCOUNTER — Other Ambulatory Visit: Payer: Self-pay | Admitting: Cardiology

## 2015-04-01 NOTE — Progress Notes (Signed)
Battery check only

## 2015-04-07 LAB — CUP PACEART REMOTE DEVICE CHECK
Brady Statistic AP VP Percent: 0.37 %
Brady Statistic AS VP Percent: 0 %
Brady Statistic RA Percent Paced: 97 %
Brady Statistic RV Percent Paced: 0.38 %
HIGH POWER IMPEDANCE MEASURED VALUE: 66 Ohm
HighPow Impedance: 50 Ohm
Lead Channel Impedance Value: 448 Ohm
Lead Channel Setting Sensing Sensitivity: 0.3 mV
MDC IDC MSMT BATTERY VOLTAGE: 2.64 V
MDC IDC MSMT LEADCHNL RA SENSING INTR AMPL: 1.485 mV
MDC IDC MSMT LEADCHNL RV IMPEDANCE VALUE: 800 Ohm
MDC IDC MSMT LEADCHNL RV SENSING INTR AMPL: 10.376 mV
MDC IDC SESS DTM: 20160722040805
MDC IDC SET LEADCHNL RA PACING AMPLITUDE: 2 V
MDC IDC SET LEADCHNL RV PACING AMPLITUDE: 2.5 V
MDC IDC SET LEADCHNL RV PACING PULSEWIDTH: 0.4 ms
MDC IDC SET ZONE DETECTION INTERVAL: 350 ms
MDC IDC STAT BRADY AP VS PERCENT: 96.62 %
MDC IDC STAT BRADY AS VS PERCENT: 3 %
Zone Setting Detection Interval: 300 ms
Zone Setting Detection Interval: 350 ms
Zone Setting Detection Interval: 390 ms

## 2015-05-07 ENCOUNTER — Encounter: Payer: Self-pay | Admitting: Cardiology

## 2015-05-08 ENCOUNTER — Ambulatory Visit (INDEPENDENT_AMBULATORY_CARE_PROVIDER_SITE_OTHER): Payer: Medicare Other | Admitting: *Deleted

## 2015-05-08 DIAGNOSIS — I255 Ischemic cardiomyopathy: Secondary | ICD-10-CM | POA: Diagnosis not present

## 2015-05-08 NOTE — Progress Notes (Signed)
Remote ICD transmission.   

## 2015-05-15 LAB — CUP PACEART REMOTE DEVICE CHECK
Brady Statistic AP VP Percent: 0.6 %
Brady Statistic AP VS Percent: 97.7 %
Brady Statistic AS VP Percent: 0.1 %
Date Time Interrogation Session: 20160909154159
HIGH POWER IMPEDANCE MEASURED VALUE: 49 Ohm
Lead Channel Sensing Intrinsic Amplitude: 10 mV
Lead Channel Setting Pacing Amplitude: 2.5 V
Lead Channel Setting Pacing Pulse Width: 0.4 ms
Lead Channel Setting Sensing Sensitivity: 0.3 mV
MDC IDC MSMT LEADCHNL RA IMPEDANCE VALUE: 440 Ohm
MDC IDC MSMT LEADCHNL RA SENSING INTR AMPL: 1.4 mV
MDC IDC MSMT LEADCHNL RV IMPEDANCE VALUE: 816 Ohm
MDC IDC SET LEADCHNL RA PACING AMPLITUDE: 2 V
MDC IDC SET ZONE DETECTION INTERVAL: 300 ms
MDC IDC SET ZONE DETECTION INTERVAL: 350 ms
MDC IDC STAT BRADY AS VS PERCENT: 1.7 %
Zone Setting Detection Interval: 350 ms
Zone Setting Detection Interval: 390 ms

## 2015-05-20 ENCOUNTER — Encounter: Payer: Self-pay | Admitting: Internal Medicine

## 2015-05-20 ENCOUNTER — Encounter: Payer: Self-pay | Admitting: Cardiology

## 2015-05-28 ENCOUNTER — Encounter: Payer: Self-pay | Admitting: Internal Medicine

## 2015-05-30 ENCOUNTER — Other Ambulatory Visit: Payer: Self-pay | Admitting: Internal Medicine

## 2015-06-08 ENCOUNTER — Encounter: Payer: Medicare Other | Admitting: *Deleted

## 2015-06-08 ENCOUNTER — Telehealth: Payer: Self-pay | Admitting: Cardiology

## 2015-06-08 NOTE — Telephone Encounter (Signed)
LMOVM reminding pt to send remote transmission.   

## 2015-06-09 ENCOUNTER — Encounter: Payer: Self-pay | Admitting: Cardiology

## 2015-06-22 ENCOUNTER — Ambulatory Visit (INDEPENDENT_AMBULATORY_CARE_PROVIDER_SITE_OTHER): Payer: Medicare Other | Admitting: *Deleted

## 2015-06-22 DIAGNOSIS — Z4502 Encounter for adjustment and management of automatic implantable cardiac defibrillator: Secondary | ICD-10-CM

## 2015-06-23 ENCOUNTER — Other Ambulatory Visit: Payer: Self-pay | Admitting: Cardiology

## 2015-06-25 NOTE — Progress Notes (Signed)
Remote ICD transmission.   

## 2015-06-27 ENCOUNTER — Other Ambulatory Visit: Payer: Self-pay | Admitting: Cardiology

## 2015-06-29 ENCOUNTER — Encounter: Payer: Self-pay | Admitting: Cardiology

## 2015-06-29 LAB — CUP PACEART REMOTE DEVICE CHECK
Battery Voltage: 2.62 V
Brady Statistic AP VP Percent: 0.51 %
Brady Statistic RA Percent Paced: 97.63 %
Brady Statistic RV Percent Paced: 0.51 %
HIGH POWER IMPEDANCE MEASURED VALUE: 48 Ohm
HIGH POWER IMPEDANCE MEASURED VALUE: 63 Ohm
Implantable Lead Implant Date: 20090908
Implantable Lead Location: 753859
Lead Channel Impedance Value: 432 Ohm
Lead Channel Setting Pacing Amplitude: 2 V
Lead Channel Setting Pacing Amplitude: 2.5 V
Lead Channel Setting Pacing Pulse Width: 0.4 ms
MDC IDC LEAD IMPLANT DT: 20090908
MDC IDC LEAD LOCATION: 753860
MDC IDC MSMT LEADCHNL RA SENSING INTR AMPL: 1.739 mV
MDC IDC MSMT LEADCHNL RV IMPEDANCE VALUE: 752 Ohm
MDC IDC MSMT LEADCHNL RV SENSING INTR AMPL: 10.376 mV
MDC IDC SESS DTM: 20161017124215
MDC IDC SET LEADCHNL RV SENSING SENSITIVITY: 0.3 mV
MDC IDC STAT BRADY AP VS PERCENT: 97.13 %
MDC IDC STAT BRADY AS VP PERCENT: 0 %
MDC IDC STAT BRADY AS VS PERCENT: 2.37 %

## 2015-07-08 ENCOUNTER — Ambulatory Visit (INDEPENDENT_AMBULATORY_CARE_PROVIDER_SITE_OTHER): Payer: Medicare Other | Admitting: Cardiology

## 2015-07-08 ENCOUNTER — Encounter: Payer: Self-pay | Admitting: Cardiology

## 2015-07-08 VITALS — BP 110/70 | HR 66 | Ht 70.5 in | Wt 201.8 lb

## 2015-07-08 DIAGNOSIS — I495 Sick sinus syndrome: Secondary | ICD-10-CM | POA: Diagnosis not present

## 2015-07-08 DIAGNOSIS — I255 Ischemic cardiomyopathy: Secondary | ICD-10-CM | POA: Diagnosis not present

## 2015-07-08 DIAGNOSIS — I5022 Chronic systolic (congestive) heart failure: Secondary | ICD-10-CM

## 2015-07-08 DIAGNOSIS — I480 Paroxysmal atrial fibrillation: Secondary | ICD-10-CM

## 2015-07-08 NOTE — Progress Notes (Signed)
Cardiology Office Note   Date:  07/08/2015   ID:  Rodney Lee, DOB [birth year], MRN [id number]  PCP:  Simona Huh, MD  Cardiologist: Darlin Coco MD  No chief complaint on file.     History of Present Illness: Rodney Lee is a 74 y.o. male who presents for a month follow-up visit  This pleasant 74 year old gentleman is seen for a scheduled 6 month followup office visit. He has a history of ischemic heart disease. He had a history of a prior anterolateral myocardial infarction. He has a low ejection fraction and has a defibrillator which was placed prophylactically. He is followed for this by Dr. Lovena Le. The patient has a history of hypercholesterolemia and is diabetic and has a past history of high blood pressure. His lipids are followed by his PCP. He has had a problem with exogenous obesity. Since last visit he was found to be having episodes of paroxysmal atrial fibrillation. He is now on Xarelto. The patient has hypercholesterolemia and diabetes. His blood work is followed by his PCP. He has osteoarthritis of his right hip which limits his ability to ambulate for more than about 15 minutes at a time. For this reason he no longer walks with his coffee "friends at church. Once we last saw him he has found a part-time job which she enjoys better.  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 She denies any chest pain or shortness of breath.  He has not had any shocks from his defibrillator.  He has had no awareness of any recurrent atrial fibrillation and he remains on Xarelto anticoagulation  Past Medical History  Diagnosis Date  . IHD (ischemic heart disease)   . Diabetes mellitus   . Hypertension   . Hyperlipidemia   . Hypothyroidism   . Exogenous obesity   . MI, old     ANTEROLATERAL  . CHF (congestive heart failure) ([place])   . Sinus bradycardia   . CAD (coronary artery disease)   . MI (myocardial infarction) [facility])       Past Surgical History  Procedure Laterality Date  . Cardiac catheterization  03/13/1994    ACUTE MI WITH TOTAL OCCLUSION OF MID LAD. REPERFUSION AFTER CROSSING WITH A GUIDEWIRE. POOR RIGHT TO LEFT COLLATERAL FLOW  . Cardiac defibrillator placement    . US echocardiography  04/26/2010    EF 35-40%. MODERATE LVH WITH MODERATE GLOBAL LV SYSTOLIC DYSFUNCTION AND IMPAIRED RELAXATION. MILD AORTIC SCLEROSIS WITHOUT STENOSIS. NORMAL PULMONARY ARTERY PRESSURE.  Marland Kitchen US echocardiography  10/07/2004    EF 30-35%  . Cardiovascular stress test  03/11/2008    EF 30%. NO REVERSIBLE ISCHEMIA  . Angioplasty       Current Outpatient Prescriptions  Medication Sig Dispense Refill  . carvedilol (COREG) 6.25 MG tablet TAKE ONE TABLET BY MOUTH TWICE DAILY WITH MEALS 180 tablet 0  . finasteride (PROSCAR) 5 MG tablet Take 5 mg by mouth daily.    Marland Kitchen glimepiride (AMARYL) 4 MG tablet Take 4 mg by mouth daily before breakfast.    . hydrochlorothiazide (MICROZIDE) 12.5 MG capsule TAKE 1 CAPSULE BY MOUTH EVERY NIGHT 90 capsule 2  . levothyroxine (SYNTHROID, LEVOTHROID) 137 MCG tablet Take 137 mcg by mouth daily.     Marland Kitchen losartan (COZAAR) 25 MG tablet TAKE ONE TABLET BY MOUTH ONCE DAILY 90 tablet 1  . metFORMIN (GLUCOPHAGE) 500 MG tablet Take 500 mg by mouth 2 (two) times daily with a meal. Take one  tablet in the morning and one tablet at night.    . Multiple Vitamin (MULTIVITAMIN) tablet Take 1 tablet by mouth daily.      . nitroGLYCERIN (NITROSTAT) 0.4 MG SL tablet Place 1 tablet (0.4 mg total) under the tongue every 5 (five) minutes as needed for chest pain. 25 tablet prn  . oxybutynin (DITROPAN-XL) 10 MG 24 hr tablet Take 10 mg by mouth daily.    . Rivaroxaban (XARELTO) 20 MG TABS tablet Take 1 tablet (20 mg total) by mouth daily with supper. 30 tablet 11  . simvastatin (ZOCOR) 20 MG tablet TAKE ONE TABLET BY MOUTH AT BEDTIME 90 tablet 0  . Tamsulosin HCl (FLOMAX) 0.4 MG CAPS Take 0.4 mg by mouth daily.      . vitamin C  (ASCORBIC ACID) 500 MG tablet Take 500 mg by mouth daily.       No current facility-administered medications for this visit.    Allergies:   Lipitor    Social History:  The patient  reports that he has never smoked. He does not have any smokeless tobacco history on file. He reports that he does not drink alcohol or use illicit drugs.   Family History:  The patient's family history includes Heart attack in his mother; Heart disease in his mother.    ROS:  Please see the history of present illness.   Otherwise, review of systems are positive for none.   All other systems are reviewed and negative.    PHYSICAL EXAM: VS:  BP 110/70 mmHg  Pulse 66  Ht 5' 10.5" (1.791 m)  Wt 201 lb 12.8 oz (91.536 kg)  BMI 28.54 kg/m2 , BMI Body mass index is 28.54 kg/(m^2). GEN: Well nourished, well developed, in no acute distress HEENT: normal Neck: no JVD, carotid bruits, or masses Cardiac: RRR; no murmurs, rubs, or gallops,no edema  Respiratory:  clear to auscultation bilaterally, normal work of breathing GI: soft, nontender, nondistended, + BS MS: no deformity or atrophy Skin: warm and dry, no rash Neuro:  Strength and sensation are intact Psych: euthymic mood, full affect   EKG:  EKG is ordered today. The ekg ordered today demonstrates atrial paced rhythm with PVCs.   Recent Labs: No results found for requested labs within last 365 days.    Lipid Panel    Component Value Date/Time   CHOL 242* 06/20/2011 1039   TRIG 288.0* 06/20/2011 1039   HDL 33.10* 06/20/2011 1039   CHOLHDL 7 06/20/2011 1039   VLDL 57.6* 06/20/2011 1039   LDLDIRECT 159.8 06/20/2011 1039      Wt Readings from Last 3 Encounters:  07/08/15 201 lb 12.8 oz (91.536 kg)  01/21/15 200 lb (90.719 kg)  08/12/14 203 lb (92.08 kg)      **   ASSESSMENT AND PLAN:  1. Ischemic cardiomyopathy 2. remote anterior lateral myocardial infarction. 3. paroxysmal atrial fibrillation. 4. Hypercholesterolemia 5. adult  onset diabetes mellitus 6. chronic systolic heart failure, compensated  Plan: Continue same medication. Recheck in 6 months for office visit. His weight has been unchanged since last visit.  Needs to lose more weight.  Continue prudent diet. Continue to try to exercise as much as he can with his arthritic hip.   Current medicines are reviewed at length with the patient today.  The patient does not have concerns regarding medicines.  The following changes have been made:  no change  Labs/ tests ordered today include:   Orders Placed This Encounter  Procedures  . EKG  12-Lead      Signed, Darlin Coco MD 07/08/2015 6:06 PM    [facility] [place], [place], [place]  [id number] Phone: [phone]; Fax: [phone]

## 2015-07-08 NOTE — Patient Instructions (Signed)
Medication Instructions:  Your physician recommends that you continue on your current medications as directed. Please refer to the Current Medication list given to you today.  Labwork: none  Testing/Procedures: none  Follow-Up: Your physician recommends that you schedule a follow-up appointment in: 6 month ov with Tera Helper NP or Brynda Rim PA  If you need a refill on your cardiac medications before your next appointment, please call your pharmacy.

## 2015-07-24 ENCOUNTER — Telehealth: Payer: Self-pay | Admitting: Cardiology

## 2015-07-24 ENCOUNTER — Ambulatory Visit (INDEPENDENT_AMBULATORY_CARE_PROVIDER_SITE_OTHER): Payer: Medicare Other | Admitting: *Deleted

## 2015-07-24 DIAGNOSIS — I255 Ischemic cardiomyopathy: Secondary | ICD-10-CM | POA: Diagnosis not present

## 2015-07-24 NOTE — Progress Notes (Signed)
Remote ICD transmission.   

## 2015-07-24 NOTE — Telephone Encounter (Signed)
LMOVM reminding pt to send remote transmission.   

## 2015-08-04 ENCOUNTER — Telehealth: Payer: Self-pay | Admitting: Cardiology

## 2015-08-04 NOTE — Telephone Encounter (Signed)
Spoke w/ pt and informed him that his device has reached RRT and that a scheduler will be calling him to schedule him an appt either w/ MD / PA / NP. Pt verbalized understanding.

## 2015-08-07 LAB — CUP PACEART REMOTE DEVICE CHECK
Battery Voltage: 2.62 V
Brady Statistic AP VS Percent: 96.6 %
HighPow Impedance: 51 Ohm
HighPow Impedance: 68 Ohm
Implantable Lead Implant Date: 20090908
Implantable Lead Implant Date: 20090908
Implantable Lead Location: 753860
Implantable Lead Model: 5076
Lead Channel Sensing Intrinsic Amplitude: 10.711 mV
Lead Channel Setting Pacing Amplitude: 2 V
Lead Channel Setting Sensing Sensitivity: 0.3 mV
MDC IDC LEAD LOCATION: 753859
MDC IDC MSMT LEADCHNL RA IMPEDANCE VALUE: 448 Ohm
MDC IDC MSMT LEADCHNL RA SENSING INTR AMPL: 1.527 mV
MDC IDC MSMT LEADCHNL RV IMPEDANCE VALUE: 816 Ohm
MDC IDC SESS DTM: 20161118210239
MDC IDC SET LEADCHNL RV PACING AMPLITUDE: 2.5 V
MDC IDC SET LEADCHNL RV PACING PULSEWIDTH: 0.4 ms
MDC IDC STAT BRADY AP VP PERCENT: 0.83 %
MDC IDC STAT BRADY AS VP PERCENT: 0 %
MDC IDC STAT BRADY AS VS PERCENT: 2.56 %
MDC IDC STAT BRADY RA PERCENT PACED: 97.43 %
MDC IDC STAT BRADY RV PERCENT PACED: 0.83 %

## 2015-08-12 ENCOUNTER — Encounter: Payer: Self-pay | Admitting: Cardiology

## 2015-08-16 ENCOUNTER — Encounter: Payer: Self-pay | Admitting: Physician Assistant

## 2015-08-16 NOTE — Progress Notes (Signed)
Cardiology Office Note Date:  08/17/2015  Patient ID:  Rodney, Lee 1941-08-18, MRN [id number] PCP:  Simona Huh, MD  Cardiologist:  Dr. Mare Ferrari (pending new cardiologist) Electrophysiologist: Dr. Lovena Le   Chief Complaint: device has reached ERI, Nov 27th  History of Present Illness: Rodney Lee is a 74 y.o. male with history of ICM, chronic systolic CHF, CAD (last intervention was in the 1990's), DM, HTN, hypothyroid, and PAFib on Xarelto, was asked to come in to discuss his device that has reached ERI.  Today he feels very well, denies any kind of CP, palpitations or SOB, no exertional intolerances, sleeping well.  He continues to work part time.  He reports his PCP monitors and manages his labs/cholseterol, recently up-titrated his simvastatin. He denies any bleeding or signs of bleeding on his Xarelto.   Past Medical History  Diagnosis Date  . IHD (ischemic heart disease)   . Diabetes mellitus   . Hypertension   . Hyperlipidemia   . Hypothyroidism   . Exogenous obesity   . MI, old     ANTEROLATERAL  . CHF (congestive heart failure) ([place])   . Sinus bradycardia   . CAD (coronary artery disease)   . cardiomyopathy     MDT ICD, Dr. Lovena Le 2009    Past Surgical History  Procedure Laterality Date  . Cardiac catheterization  03/13/1994    ACUTE MI WITH TOTAL OCCLUSION OF MID LAD. REPERFUSION AFTER CROSSING WITH A GUIDEWIRE. POOR RIGHT TO LEFT COLLATERAL FLOW  . Cardiac defibrillator placement    . US echocardiography  04/26/2010    EF 35-40%. MODERATE LVH WITH MODERATE GLOBAL LV SYSTOLIC DYSFUNCTION AND IMPAIRED RELAXATION. MILD AORTIC SCLEROSIS WITHOUT STENOSIS. NORMAL PULMONARY ARTERY PRESSURE.  Marland Kitchen US echocardiography  10/07/2004    EF 30-35%  . Cardiovascular stress test  03/11/2008    EF 30%. NO REVERSIBLE ISCHEMIA  . Angioplasty      Current Outpatient Prescriptions  Medication Sig Dispense Refill  . carvedilol (COREG) 6.25 MG tablet TAKE ONE TABLET BY  MOUTH TWICE DAILY WITH MEALS 180 tablet 0  . finasteride (PROSCAR) 5 MG tablet Take 5 mg by mouth daily.    Marland Kitchen glimepiride (AMARYL) 4 MG tablet Take 4 mg by mouth daily before breakfast.    . hydrochlorothiazide (MICROZIDE) 12.5 MG capsule TAKE 1 CAPSULE BY MOUTH EVERY NIGHT 90 capsule 2  . levothyroxine (SYNTHROID, LEVOTHROID) 137 MCG tablet Take 137 mcg by mouth daily.     Marland Kitchen losartan (COZAAR) 25 MG tablet TAKE ONE TABLET BY MOUTH ONCE DAILY 90 tablet 1  . metFORMIN (GLUCOPHAGE) 500 MG tablet Take 500 mg by mouth 2 (two) times daily with a meal. Take one tablet in the morning and one tablet at night.    . Multiple Vitamin (MULTIVITAMIN) tablet Take 1 tablet by mouth daily.      . nitroGLYCERIN (NITROSTAT) 0.4 MG SL tablet Place 1 tablet (0.4 mg total) under the tongue every 5 (five) minutes as needed for chest pain. 25 tablet prn  . oxybutynin (DITROPAN-XL) 10 MG 24 hr tablet Take 10 mg by mouth daily.    . Rivaroxaban (XARELTO) 20 MG TABS tablet Take 1 tablet (20 mg total) by mouth daily with supper. 30 tablet 11  . simvastatin (ZOCOR) 40 MG tablet Take 40 mg by mouth daily.    . Tamsulosin HCl (FLOMAX) 0.4 MG CAPS Take 0.4 mg by mouth daily.      . vitamin C (ASCORBIC ACID) 500 MG tablet  Take 500 mg by mouth daily.       No current facility-administered medications for this visit.    Allergies:   Lipitor   Social History:  The patient  reports that he has never smoked. He does not have any smokeless tobacco history on file. He reports that he does not drink alcohol or use illicit drugs.    Family History:  The patient's family history includes Heart attack in his mother; Heart disease in his mother.  ROS:  Please see the history of present illness. .   All other systems are reviewed and otherwise negative.   PHYSICAL EXAM:  VS:  BP 118/60 mmHg  Pulse 65  Ht 5' 10.5" (1.791 m)  Wt 207 lb 9.6 oz (94.167 kg)  BMI 29.36 kg/m2 BMI: Body mass index is 29.36 kg/(m^2). Very plesent, obese  WM, in no acute distress HEENT: normocephalic, atraumatic Neck: no JVD, carotid bruits or masses Cardiac:  normal S1, S2; RRR; no significant murmurs, no rubs, or gallops Lungs:  clear to auscultation bilaterally, no wheezing, rhonchi or rales Abd: soft, nontender,  + BS MS: no deformity or atrophy Ext: no edema Skin: warm and dry, no rash Neuro:  No gross deficits appreciated Psych: euthymic mood, full affect  ICD site is stable, no tethering or discomfort   EKG:  Done today shows atrial paced, V sensed   Recent Labs: none    Wt Readings from Last 3 Encounters:  08/17/15 207 lb 9.6 oz (94.167 kg)  07/08/15 201 lb 12.8 oz (91.536 kg)  01/21/15 200 lb (90.719 kg)     Other studies reviewed: Additional studies/records reviewed today include: summarized above  DEVICE: MDT ICD implanted Sept 2009 (Dr. Lovena Le)  ASSESSMENT AND PLAN:  1. ICM withMDT ICD       device has reached ERI plan for generator change       Continue q42mo remotes   2.  CAD         no CP        On BB, statin        Counseled on weight management and exercise  3.   Chronic CHF       appears well compensated      on BB/ARB, HCTZ  4.    PAFib        CHADS2Vasc is at least 5         on Xarelto        No bleeding, signs of bleeding  5.    HTN          appears controlled  Disposition: F/u with wound check post procedure and Dr. Lovena Le in 80mo, we will get an echo doppler done to update this for him.  Current medicines are reviewed at length with the patient today.  The patient did not have any concerns regarding medicines.  [name], PA-C 08/17/2015 8:41 AM     [facility] [address] [place] [address] [phone] (office)  [phone] (fax)

## 2015-08-17 ENCOUNTER — Encounter: Payer: Self-pay | Admitting: *Deleted

## 2015-08-17 ENCOUNTER — Other Ambulatory Visit: Payer: Self-pay | Admitting: *Deleted

## 2015-08-17 ENCOUNTER — Encounter: Payer: Self-pay | Admitting: Physician Assistant

## 2015-08-17 ENCOUNTER — Ambulatory Visit (INDEPENDENT_AMBULATORY_CARE_PROVIDER_SITE_OTHER): Payer: Medicare Other | Admitting: Physician Assistant

## 2015-08-17 VITALS — BP 118/60 | HR 65 | Ht 70.5 in | Wt 207.6 lb

## 2015-08-17 DIAGNOSIS — I480 Paroxysmal atrial fibrillation: Secondary | ICD-10-CM

## 2015-08-17 DIAGNOSIS — I495 Sick sinus syndrome: Secondary | ICD-10-CM | POA: Diagnosis not present

## 2015-08-17 DIAGNOSIS — Z95810 Presence of automatic (implantable) cardiac defibrillator: Secondary | ICD-10-CM

## 2015-08-17 DIAGNOSIS — I259 Chronic ischemic heart disease, unspecified: Secondary | ICD-10-CM | POA: Diagnosis not present

## 2015-08-17 DIAGNOSIS — I472 Ventricular tachycardia, unspecified: Secondary | ICD-10-CM

## 2015-08-17 DIAGNOSIS — I5022 Chronic systolic (congestive) heart failure: Secondary | ICD-10-CM

## 2015-08-17 DIAGNOSIS — I10 Essential (primary) hypertension: Secondary | ICD-10-CM

## 2015-08-17 LAB — CUP PACEART INCLINIC DEVICE CHECK
Battery Voltage: 2.62 V
Brady Statistic AP VP Percent: 0.57 %
Brady Statistic AP VS Percent: 97.16 %
Brady Statistic AS VP Percent: 0 %
Date Time Interrogation Session: 20161212151131
HIGH POWER IMPEDANCE MEASURED VALUE: 63 Ohm
HighPow Impedance: 48 Ohm
Implantable Lead Implant Date: 20090908
Implantable Lead Location: 753860
Lead Channel Impedance Value: 792 Ohm
Lead Channel Pacing Threshold Amplitude: 1.5 V
Lead Channel Pacing Threshold Pulse Width: 0.4 ms
Lead Channel Sensing Intrinsic Amplitude: 11.381 mV
Lead Channel Sensing Intrinsic Amplitude: 4.326 mV
Lead Channel Setting Pacing Amplitude: 2 V
Lead Channel Setting Pacing Pulse Width: 0.4 ms
Lead Channel Setting Sensing Sensitivity: 0.3 mV
MDC IDC LEAD IMPLANT DT: 20090908
MDC IDC LEAD LOCATION: 753859
MDC IDC MSMT LEADCHNL RA IMPEDANCE VALUE: 432 Ohm
MDC IDC MSMT LEADCHNL RA PACING THRESHOLD AMPLITUDE: 1 V
MDC IDC MSMT LEADCHNL RA PACING THRESHOLD PULSEWIDTH: 0.4 ms
MDC IDC SET LEADCHNL RV PACING AMPLITUDE: 2.5 V
MDC IDC STAT BRADY AS VS PERCENT: 2.26 %
MDC IDC STAT BRADY RA PERCENT PACED: 97.74 %
MDC IDC STAT BRADY RV PERCENT PACED: 0.58 %

## 2015-08-17 NOTE — Patient Instructions (Addendum)
Medication Instructions:   CONTINUE SAME  MEDICATONS    If you need a refill on your cardiac medications before your next appointment, please call your pharmacy.  Labwork:  CBC BMET 5 DAYS PRIOR TO PROCEDURE 09/28/15     Testing/Procedures:  SEE LETTER FOR GEN CHANGE ICD    Follow-Up:  14  DAY WOUND CHECK  AFTER 09/28/15  91 DAY FOLLOW UP WITH DR Lovena Le   AFTER  09/28/15  PHYS DEFIB CK    Any Other Special Instructions Will Be Listed Below (If Applicable).

## 2015-08-18 ENCOUNTER — Other Ambulatory Visit: Payer: Self-pay | Admitting: Physician Assistant

## 2015-08-20 ENCOUNTER — Telehealth: Payer: Self-pay | Admitting: Cardiology

## 2015-08-20 NOTE — Telephone Encounter (Signed)
left xarelto samples at front desk. called pt and he expressed understanding.

## 2015-08-20 NOTE — Telephone Encounter (Signed)
New message    PCP calling patient wants sample of xarelto  20 mg . Stated if we do not have sample he will not be able to afford medication due to cost.     Patient calling the office for samples of medication:   1.  What medication and dosage are you requesting samples for? xarelto 20  mg   2.  Are you currently out of this medication? Yes

## 2015-09-01 ENCOUNTER — Other Ambulatory Visit: Payer: Self-pay

## 2015-09-01 ENCOUNTER — Other Ambulatory Visit (HOSPITAL_COMMUNITY): Payer: Medicare Other | Admitting: *Deleted

## 2015-09-01 ENCOUNTER — Ambulatory Visit (HOSPITAL_COMMUNITY): Payer: Medicare Other | Attending: Cardiovascular Disease

## 2015-09-01 DIAGNOSIS — R29898 Other symptoms and signs involving the musculoskeletal system: Secondary | ICD-10-CM | POA: Insufficient documentation

## 2015-09-01 DIAGNOSIS — E785 Hyperlipidemia, unspecified: Secondary | ICD-10-CM | POA: Insufficient documentation

## 2015-09-01 DIAGNOSIS — I429 Cardiomyopathy, unspecified: Secondary | ICD-10-CM | POA: Diagnosis present

## 2015-09-01 DIAGNOSIS — I517 Cardiomegaly: Secondary | ICD-10-CM | POA: Insufficient documentation

## 2015-09-01 DIAGNOSIS — I259 Chronic ischemic heart disease, unspecified: Secondary | ICD-10-CM

## 2015-09-01 DIAGNOSIS — I10 Essential (primary) hypertension: Secondary | ICD-10-CM | POA: Diagnosis not present

## 2015-09-01 DIAGNOSIS — E119 Type 2 diabetes mellitus without complications: Secondary | ICD-10-CM | POA: Diagnosis not present

## 2015-09-01 DIAGNOSIS — I252 Old myocardial infarction: Secondary | ICD-10-CM | POA: Diagnosis not present

## 2015-09-01 MED ORDER — PERFLUTREN LIPID MICROSPHERE
1.0000 mL | INTRAVENOUS | Status: AC | PRN
  Administered 2015-09-01: 3 mL via INTRAVENOUS

## 2015-09-04 ENCOUNTER — Telehealth: Payer: Self-pay | Admitting: *Deleted

## 2015-09-04 ENCOUNTER — Encounter: Payer: Self-pay | Admitting: Internal Medicine

## 2015-09-04 NOTE — Telephone Encounter (Signed)
-----   Message from [place], [place] sent at 09/01/2015  1:30 PM EST ----- Please let the patient know his echo result showed his heart muscle function remains reduced, no needed changes at this time, will proceed with his planned generator change next month.  Thank-you Tommye Standard, PA-C

## 2015-09-05 ENCOUNTER — Other Ambulatory Visit: Payer: Self-pay | Admitting: Cardiology

## 2015-09-09 ENCOUNTER — Telehealth: Payer: Self-pay | Admitting: Internal Medicine

## 2015-09-09 NOTE — Telephone Encounter (Signed)
Spoke with patient and let him know he needed to continue on the Xarelto.  I have placed samples out front for hi and he is aware

## 2015-09-09 NOTE — Telephone Encounter (Signed)
New message     Patient calling brought 81 mg ASA. Unable to get samples of Xarleto  20 mg would this be okay.

## 2015-09-23 ENCOUNTER — Other Ambulatory Visit (INDEPENDENT_AMBULATORY_CARE_PROVIDER_SITE_OTHER): Payer: Medicare Other | Admitting: *Deleted

## 2015-09-23 DIAGNOSIS — I259 Chronic ischemic heart disease, unspecified: Secondary | ICD-10-CM | POA: Diagnosis not present

## 2015-09-23 LAB — BASIC METABOLIC PANEL
BUN: 29 mg/dL — ABNORMAL HIGH (ref 7–25)
CALCIUM: 9.3 mg/dL (ref 8.6–10.3)
CO2: 24 mmol/L (ref 20–31)
CREATININE: 1.54 mg/dL — AB (ref 0.70–1.18)
Chloride: 102 mmol/L (ref 98–110)
GLUCOSE: 183 mg/dL — AB (ref 65–99)
Potassium: 4.3 mmol/L (ref 3.5–5.3)
Sodium: 137 mmol/L (ref 135–146)

## 2015-09-23 LAB — CBC WITH DIFFERENTIAL/PLATELET
BASOS PCT: 1 % (ref 0–1)
Basophils Absolute: 0.1 10*3/uL (ref 0.0–0.1)
Eosinophils Absolute: 0.2 10*3/uL (ref 0.0–0.7)
Eosinophils Relative: 4 % (ref 0–5)
HCT: 46.9 % (ref 39.0–52.0)
Hemoglobin: 16.8 g/dL (ref 13.0–17.0)
Lymphocytes Relative: 20 % (ref 12–46)
Lymphs Abs: 1.2 10*3/uL (ref 0.7–4.0)
MCH: 37.5 pg — AB (ref 26.0–34.0)
MCHC: 35.8 g/dL (ref 30.0–36.0)
MCV: 104.7 fL — ABNORMAL HIGH (ref 78.0–100.0)
MONO ABS: 0.9 10*3/uL (ref 0.1–1.0)
MONOS PCT: 14 % — AB (ref 3–12)
MPV: 11 fL (ref 8.6–12.4)
NEUTROS PCT: 61 % (ref 43–77)
Neutro Abs: 3.7 10*3/uL (ref 1.7–7.7)
PLATELETS: 131 10*3/uL — AB (ref 150–400)
RBC: 4.48 MIL/uL (ref 4.22–5.81)
RDW: 15.4 % (ref 11.5–15.5)
WBC: 6.1 10*3/uL (ref 4.0–10.5)

## 2015-09-25 ENCOUNTER — Telehealth: Payer: Self-pay | Admitting: *Deleted

## 2015-09-25 NOTE — Telephone Encounter (Signed)
-----   Message from [facility], [place] sent at 09/25/2015  4:20 PM EST ----- Please let the patient know his labs are OK for his pending procedure Please ask him not to take his xarelto the day before and the day of his battery change.  Thanks [employer]

## 2015-09-27 MED ORDER — SODIUM CHLORIDE 0.9 % IR SOLN
80.0000 mg | Status: AC
  Administered 2015-09-28: 80 mg
  Filled 2015-09-27: qty 2

## 2015-09-27 MED ORDER — CEFAZOLIN SODIUM-DEXTROSE 2-3 GM-% IV SOLR
2.0000 g | INTRAVENOUS | Status: AC
  Administered 2015-09-28: 2 g via INTRAVENOUS

## 2015-09-28 ENCOUNTER — Telehealth: Payer: Self-pay | Admitting: *Deleted

## 2015-09-28 ENCOUNTER — Encounter (HOSPITAL_COMMUNITY): Payer: Self-pay | Admitting: Internal Medicine

## 2015-09-28 ENCOUNTER — Ambulatory Visit (HOSPITAL_COMMUNITY)
Admission: RE | Admit: 2015-09-28 | Discharge: 2015-09-28 | Disposition: A | Discharge status: Home / Self Care | Payer: Medicare Other | Source: Ambulatory Visit | Attending: Internal Medicine | Admitting: Internal Medicine

## 2015-09-28 ENCOUNTER — Encounter (HOSPITAL_COMMUNITY)
Admission: RE | Disposition: A | Discharge status: Home / Self Care | Payer: Medicare Other | Source: Ambulatory Visit | Attending: Internal Medicine

## 2015-09-28 DIAGNOSIS — Z95810 Presence of automatic (implantable) cardiac defibrillator: Secondary | ICD-10-CM | POA: Diagnosis present

## 2015-09-28 DIAGNOSIS — E039 Hypothyroidism, unspecified: Secondary | ICD-10-CM | POA: Diagnosis not present

## 2015-09-28 DIAGNOSIS — I252 Old myocardial infarction: Secondary | ICD-10-CM | POA: Diagnosis not present

## 2015-09-28 DIAGNOSIS — Z4502 Encounter for adjustment and management of automatic implantable cardiac defibrillator: Secondary | ICD-10-CM | POA: Insufficient documentation

## 2015-09-28 DIAGNOSIS — E785 Hyperlipidemia, unspecified: Secondary | ICD-10-CM | POA: Insufficient documentation

## 2015-09-28 DIAGNOSIS — Z7901 Long term (current) use of anticoagulants: Secondary | ICD-10-CM | POA: Diagnosis not present

## 2015-09-28 DIAGNOSIS — I110 Hypertensive heart disease with heart failure: Secondary | ICD-10-CM | POA: Diagnosis not present

## 2015-09-28 DIAGNOSIS — I5022 Chronic systolic (congestive) heart failure: Secondary | ICD-10-CM | POA: Diagnosis not present

## 2015-09-28 DIAGNOSIS — I480 Paroxysmal atrial fibrillation: Secondary | ICD-10-CM | POA: Diagnosis not present

## 2015-09-28 DIAGNOSIS — I2510 Atherosclerotic heart disease of native coronary artery without angina pectoris: Secondary | ICD-10-CM | POA: Insufficient documentation

## 2015-09-28 DIAGNOSIS — Z7984 Long term (current) use of oral hypoglycemic drugs: Secondary | ICD-10-CM | POA: Diagnosis not present

## 2015-09-28 DIAGNOSIS — I495 Sick sinus syndrome: Secondary | ICD-10-CM | POA: Diagnosis not present

## 2015-09-28 DIAGNOSIS — I255 Ischemic cardiomyopathy: Secondary | ICD-10-CM | POA: Diagnosis not present

## 2015-09-28 DIAGNOSIS — E119 Type 2 diabetes mellitus without complications: Secondary | ICD-10-CM | POA: Insufficient documentation

## 2015-09-28 HISTORY — PX: EP IMPLANTABLE DEVICE: SHX172B

## 2015-09-28 LAB — GLUCOSE, CAPILLARY
GLUCOSE-CAPILLARY: 168 mg/dL — AB (ref 65–99)
Glucose-Capillary: 130 mg/dL — ABNORMAL HIGH (ref 65–99)

## 2015-09-28 LAB — SURGICAL PCR SCREEN
MRSA, PCR: NEGATIVE
STAPHYLOCOCCUS AUREUS: NEGATIVE

## 2015-09-28 SURGERY — ICD/BIV ICD GENERATOR CHANGEOUT

## 2015-09-28 MED ORDER — SODIUM CHLORIDE 0.9 % IJ SOLN: 3.0000 mL | Freq: Two times a day (BID) | INTRAMUSCULAR | Status: DC

## 2015-09-28 MED ORDER — FENTANYL CITRATE (PF) 100 MCG/2ML IJ SOLN
INTRAMUSCULAR | Status: DC | PRN
  Administered 2015-09-28 (×3): 12.5 ug via INTRAVENOUS
  Administered 2015-09-28: 25 ug via INTRAVENOUS

## 2015-09-28 MED ORDER — SODIUM CHLORIDE 0.9 % IV SOLN
INTRAVENOUS | Status: DC
  Administered 2015-09-28: 08:00:00 via INTRAVENOUS

## 2015-09-28 MED ORDER — SODIUM CHLORIDE 0.9 % IR SOLN
Status: AC
  Filled 2015-09-28: qty 2

## 2015-09-28 MED ORDER — SODIUM CHLORIDE 0.9 % IJ SOLN: 3.0000 mL | INTRAMUSCULAR | Status: DC | PRN

## 2015-09-28 MED ORDER — MIDAZOLAM HCL 5 MG/5ML IJ SOLN
INTRAMUSCULAR | Status: AC
  Filled 2015-09-28: qty 5

## 2015-09-28 MED ORDER — ACETAMINOPHEN 325 MG PO TABS
325.0000 mg | ORAL_TABLET | ORAL | Status: DC | PRN
  Filled 2015-09-28: qty 2

## 2015-09-28 MED ORDER — CHLORHEXIDINE GLUCONATE 4 % EX LIQD: 60.0000 mL | Freq: Once | CUTANEOUS | Status: DC

## 2015-09-28 MED ORDER — MUPIROCIN 2 % EX OINT
1.0000 "application " | TOPICAL_OINTMENT | Freq: Once | CUTANEOUS | Status: AC
  Administered 2015-09-28: 1 via TOPICAL

## 2015-09-28 MED ORDER — CEFAZOLIN SODIUM-DEXTROSE 2-3 GM-% IV SOLR
INTRAVENOUS | Status: AC
  Filled 2015-09-28: qty 50

## 2015-09-28 MED ORDER — ONDANSETRON HCL 4 MG/2ML IJ SOLN: 4.0000 mg | Freq: Four times a day (QID) | INTRAMUSCULAR | Status: DC | PRN

## 2015-09-28 MED ORDER — LIDOCAINE HCL (PF) 1 % IJ SOLN
INTRAMUSCULAR | Status: AC
  Filled 2015-09-28: qty 60

## 2015-09-28 MED ORDER — FENTANYL CITRATE (PF) 100 MCG/2ML IJ SOLN
INTRAMUSCULAR | Status: AC
  Filled 2015-09-28: qty 2

## 2015-09-28 MED ORDER — SODIUM CHLORIDE 0.9 % IV SOLN: 250.0000 mL | INTRAVENOUS | Status: DC

## 2015-09-28 MED ORDER — LIDOCAINE HCL (PF) 1 % IJ SOLN
INTRAMUSCULAR | Status: DC | PRN
  Administered 2015-09-28: 15 mL via INTRADERMAL

## 2015-09-28 MED ORDER — MUPIROCIN 2 % EX OINT
TOPICAL_OINTMENT | CUTANEOUS | Status: AC
Start: 2015-09-28 — End: 2015-09-28
  Administered 2015-09-28: 1 via TOPICAL
  Filled 2015-09-28: qty 22

## 2015-09-28 MED ORDER — MIDAZOLAM HCL 5 MG/5ML IJ SOLN
INTRAMUSCULAR | Status: DC | PRN
  Administered 2015-09-28 (×5): 1 mg via INTRAVENOUS

## 2015-09-28 SURGICAL SUPPLY — 4 items
CABLE SURGICAL S-101-97-12 (CABLE) ×1 IMPLANT
ICD EVERA XT DR DDBB1D1 (ICD Generator) ×1 IMPLANT
PAD DEFIB LIFELINK (PAD) ×1 IMPLANT
TRAY PACEMAKER INSERTION (PACKS) ×2 IMPLANT

## 2015-09-28 NOTE — Telephone Encounter (Signed)
ERROR

## 2015-09-28 NOTE — H&P (Signed)
  ICD Criteria  Current LVEF:25%. Within 12 months prior to implant: Yes   Heart failure history: Yes, Class II  Cardiomyopathy history: Yes, Ischemic Cardiomyopathy.  Atrial Fibrillation/Atrial Flutter: Yes, Paroxysmal.  Ventricular tachycardia history: No.  Cardiac arrest history: No.  History of syndromes with risk of sudden death: No.  Previous ICD: Yes, Reason for ICD:  Primary prevention.  Current ICD indication: Primary  PPM indication: Yes. Pacing type: Atrial. Less than 40% RV pacing requirement anticipated. Indication: Sick Sinus Syndrome   Class I or II Bradycardia indication present: Yes  Beta Blocker therapy for 3 or more months: Yes, prescribed.   Ace Inhibitor/ARB therapy for 3 or more months: Yes, prescribed.

## 2015-09-28 NOTE — Discharge Instructions (Signed)
Pacemaker Battery Change, Care After Refer to this sheet in the next few weeks. These instructions provide you with information on caring for yourself after your procedure. Your health care provider may also give you more specific instructions. Your treatment has been planned according to current medical practices, but problems sometimes occur. Call your health care provider if you have any problems or questions after your procedure. WHAT TO EXPECT AFTER THE PROCEDURE After your procedure, it is typical to have the following sensations:  Soreness at the pacemaker site. HOME CARE INSTRUCTIONS   Keep the incision clean and dry.  Do not get site wet for one week  For the first week after the replacement, avoid stretching motions that pull at the incision site, and avoid heavy exercise with the arm that is on the same side as the incision.  Take medicines only as directed by your health care provider.  Keep all follow-up visits as directed by your health care provider. SEEK MEDICAL CARE IF:   You have pain at the incision site that is not relieved by over-the-counter or prescription medicine.  There is drainage or pus from the incision site.  There is swelling larger than a lime at the incision site.  You develop red streaking that extends above or below the incision site.  You feel brief, intermittent palpitations, light-headedness, or any symptoms that you feel might be related to your heart. SEEK IMMEDIATE MEDICAL CARE IF:   You experience chest pain that is different than the pain at the pacemaker site.  You experience shortness of breath.  You have palpitations or irregular heartbeat.  You have light-headedness that does not go away quickly.  You faint.  You have pain that gets worse and is not relieved by medicine.   This information is not intended to replace advice given to you by your health care provider. Make sure you discuss any questions you have with your health  care provider.   Document Released: 06/12/2013 Document Revised: 09/12/2014 Document Reviewed: 06/12/2013 Elsevier Interactive Patient Education [employer].

## 2015-09-28 NOTE — H&P (Signed)
Cardiology Office Note Date: 08/17/2015  Patient ID: Rodney, Lee 1940-10-28, MRN [id number] PCP: Simona Huh, MD Cardiologist: Dr. Mare Ferrari (pending new cardiologist) Electrophysiologist: Dr. Lovena Le  Chief Complaint: device has reached ERI, Nov 27th  History of Present Illness: Rodney Lee is a 75 y.o. male with history of ICM, chronic systolic CHF, CAD (last intervention was in the 1990's), DM, HTN, hypothyroid, and PAFib on Xarelto, was asked to come in to discuss his device that has reached ERI.  Today he feels very well, denies any kind of CP, palpitations or SOB, no exertional intolerances, sleeping well. He continues to work part time. He reports his PCP monitors and manages his labs/cholseterol, recently up-titrated his simvastatin. He denies any bleeding or signs of bleeding on his Xarelto.   Past Medical History  Diagnosis Date  . IHD (ischemic heart disease)   . Diabetes mellitus   . Hypertension   . Hyperlipidemia   . Hypothyroidism   . Exogenous obesity   . MI, old     ANTEROLATERAL  . CHF (congestive heart failure) ([place])   . Sinus bradycardia   . CAD (coronary artery disease)   . cardiomyopathy     MDT ICD, Dr. Lovena Le 2009    Past Surgical History  Procedure Laterality Date  . Cardiac catheterization  03/13/1994    ACUTE MI WITH TOTAL OCCLUSION OF MID LAD. REPERFUSION AFTER CROSSING WITH A GUIDEWIRE. POOR RIGHT TO LEFT COLLATERAL FLOW  . Cardiac defibrillator placement    . US echocardiography  04/26/2010    EF 35-40%. MODERATE LVH WITH MODERATE GLOBAL LV SYSTOLIC DYSFUNCTION AND IMPAIRED RELAXATION. MILD AORTIC SCLEROSIS WITHOUT STENOSIS. NORMAL PULMONARY ARTERY PRESSURE.  Marland Kitchen US echocardiography  10/07/2004    EF 30-35%  . Cardiovascular stress test  03/11/2008    EF 30%. NO REVERSIBLE ISCHEMIA  . Angioplasty      Current Outpatient Prescriptions   Medication Sig Dispense Refill  . carvedilol (COREG) 6.25 MG tablet TAKE ONE TABLET BY MOUTH TWICE DAILY WITH MEALS 180 tablet 0  . finasteride (PROSCAR) 5 MG tablet Take 5 mg by mouth daily.    Marland Kitchen glimepiride (AMARYL) 4 MG tablet Take 4 mg by mouth daily before breakfast.    . hydrochlorothiazide (MICROZIDE) 12.5 MG capsule TAKE 1 CAPSULE BY MOUTH EVERY NIGHT 90 capsule 2  . levothyroxine (SYNTHROID, LEVOTHROID) 137 MCG tablet Take 137 mcg by mouth daily.     Marland Kitchen losartan (COZAAR) 25 MG tablet TAKE ONE TABLET BY MOUTH ONCE DAILY 90 tablet 1  . metFORMIN (GLUCOPHAGE) 500 MG tablet Take 500 mg by mouth 2 (two) times daily with a meal. Take one tablet in the morning and one tablet at night.    . Multiple Vitamin (MULTIVITAMIN) tablet Take 1 tablet by mouth daily.     . nitroGLYCERIN (NITROSTAT) 0.4 MG SL tablet Place 1 tablet (0.4 mg total) under the tongue every 5 (five) minutes as needed for chest pain. 25 tablet prn  . oxybutynin (DITROPAN-XL) 10 MG 24 hr tablet Take 10 mg by mouth daily.    . Rivaroxaban (XARELTO) 20 MG TABS tablet Take 1 tablet (20 mg total) by mouth daily with supper. 30 tablet 11  . simvastatin (ZOCOR) 40 MG tablet Take 40 mg by mouth daily.    . Tamsulosin HCl (FLOMAX) 0.4 MG CAPS Take 0.4 mg by mouth daily.     . vitamin C (ASCORBIC ACID) 500 MG tablet Take 500 mg by mouth daily.  No current facility-administered medications for this visit.    Allergies: Lipitor   Social History: The patient  reports that he has never smoked. He does not have any smokeless tobacco history on file. He reports that he does not drink alcohol or use illicit drugs.   Family History: The patient's family history includes Heart attack in his mother; Heart disease in his mother.  ROS: Please see the history of present illness. .  All other systems are reviewed and otherwise negative.   PHYSICAL  EXAM:  VS: BP 118/60 mmHg  Pulse 65  Ht 5' 10.5" (1.791 m)  Wt 207 lb 9.6 oz (94.167 kg)  BMI 29.36 kg/m2 BMI: Body mass index is 29.36 kg/(m^2). Very plesent, obese WM, in no acute distress  HEENT: normocephalic, atraumatic  Neck: no JVD, carotid bruits or masses Cardiac: normal S1, S2; RRR; no significant murmurs, no rubs, or gallops Lungs: clear to auscultation bilaterally, no wheezing, rhonchi or rales  Abd: soft, nontender, + BS MS: no deformity or atrophy Ext: no edema  Skin: warm and dry, no rash Neuro: No gross deficits appreciated Psych: euthymic mood, full affect  ICD site is stable, no tethering or discomfort   EKG: Done today shows atrial paced, V sensed   Recent Labs: none    Wt Readings from Last 3 Encounters:  08/17/15 207 lb 9.6 oz (94.167 kg)  07/08/15 201 lb 12.8 oz (91.536 kg)  01/21/15 200 lb (90.719 kg)     Other studies reviewed: Additional studies/records reviewed today include: summarized above  DEVICE: MDT ICD implanted Sept 2009 (Dr. Lovena Le)  ASSESSMENT AND PLAN:  1. ICM withMDT ICD  device has reached ERI plan for generator change  Continue q23mo remotes   2. CAD  no CP  On BB, statin  Counseled on weight management and exercise  3. Chronic CHF   appears well compensated  on BB/ARB, HCTZ  4. PAFib   CHADS2Vasc is at least 5   on Xarelto  No bleeding, signs of bleeding  5. HTN   appears controlled  Disposition: F/u with wound check post procedure and Dr. Lovena Le in 30mo, we will get an echo doppler done to update this for him.  Current medicines are reviewed at length with the patient today. The patient did not have any concerns regarding medicines.  [name], PA-C 08/17/2015 8:41 AM         EP Attending  Patient seen and examined. Agree with above findings. In the interim repeat echo shows his EF to be 25%. Will  plan to proceed with ICD generator change.  Mikle Bosworth.D.

## 2015-09-29 MED FILL — Sodium Chloride Irrigation Soln 0.9%: Qty: 500 | Status: AC

## 2015-09-29 MED FILL — Gentamicin Sulfate Inj 40 MG/ML: INTRAMUSCULAR | Qty: 2 | Status: AC

## 2015-10-03 ENCOUNTER — Other Ambulatory Visit: Payer: Self-pay | Admitting: Cardiology

## 2015-10-05 ENCOUNTER — Telehealth: Payer: Self-pay | Admitting: Internal Medicine

## 2015-10-05 NOTE — Telephone Encounter (Signed)
Samples placed at the front desk. Patient aware.

## 2015-10-05 NOTE — Telephone Encounter (Signed)
New message    Patient calling the office for samples of medication:   1.  What medication and dosage are you requesting samples for? xarelto  20 mg   2.  Are you currently out of this medication? Yes

## 2015-10-12 ENCOUNTER — Ambulatory Visit (INDEPENDENT_AMBULATORY_CARE_PROVIDER_SITE_OTHER): Payer: Medicare Other | Admitting: *Deleted

## 2015-10-12 ENCOUNTER — Encounter: Payer: Self-pay | Admitting: Internal Medicine

## 2015-10-12 DIAGNOSIS — I255 Ischemic cardiomyopathy: Secondary | ICD-10-CM

## 2015-10-12 DIAGNOSIS — Z95810 Presence of automatic (implantable) cardiac defibrillator: Secondary | ICD-10-CM | POA: Diagnosis not present

## 2015-10-12 LAB — CUP PACEART INCLINIC DEVICE CHECK
Battery Remaining Longevity: 117 mo
Battery Voltage: 3.13 V
Brady Statistic AP VP Percent: 0.12 %
Brady Statistic AS VP Percent: 0 %
Brady Statistic RA Percent Paced: 98.27 %
Brady Statistic RV Percent Paced: 0.12 %
HIGH POWER IMPEDANCE MEASURED VALUE: 49 Ohm
HIGH POWER IMPEDANCE MEASURED VALUE: 65 Ohm
Implantable Lead Implant Date: 20090908
Implantable Lead Implant Date: 20090908
Implantable Lead Location: 753860
Implantable Lead Model: 5076
Lead Channel Impedance Value: 817 Ohm
Lead Channel Impedance Value: 855 Ohm
Lead Channel Pacing Threshold Amplitude: 0.5 V
Lead Channel Pacing Threshold Amplitude: 1 V
Lead Channel Pacing Threshold Pulse Width: 0.4 ms
Lead Channel Sensing Intrinsic Amplitude: 11.375 mV
Lead Channel Setting Pacing Amplitude: 2 V
Lead Channel Setting Pacing Amplitude: 2.5 V
Lead Channel Setting Sensing Sensitivity: 0.3 mV
MDC IDC LEAD LOCATION: 753859
MDC IDC MSMT LEADCHNL RA IMPEDANCE VALUE: 456 Ohm
MDC IDC MSMT LEADCHNL RA SENSING INTR AMPL: 4.25 mV
MDC IDC MSMT LEADCHNL RV PACING THRESHOLD PULSEWIDTH: 0.4 ms
MDC IDC SESS DTM: 20170206102217
MDC IDC SET LEADCHNL RV PACING PULSEWIDTH: 0.4 ms
MDC IDC STAT BRADY AP VS PERCENT: 98.15 %
MDC IDC STAT BRADY AS VS PERCENT: 1.73 %

## 2015-10-12 NOTE — Progress Notes (Signed)
Wound check appointment. Steri-strips removed. Wound without redness or edema. Incision edges approximated, wound well healed. Normal device function. Thresholds, sensing, and impedances consistent with implant measurements. Device programmed at chronic outputs. Histogram distribution appropriate for patient and level of activity. No mode switches or ventricular arrhythmias noted. Patient educated about wound care, arm mobility, lifting restrictions, shock plan. ROV in 3 months with GT.

## 2015-10-12 NOTE — Addendum Note (Signed)
Addended by: Mechele Dawley on: 10/12/2015 10:23 AM   Modules accepted: Medications

## 2015-10-29 ENCOUNTER — Telehealth: Payer: Self-pay | Admitting: Cardiology

## 2015-10-29 MED ORDER — HYDROCHLOROTHIAZIDE 12.5 MG PO CAPS: ORAL_CAPSULE | ORAL | Status: DC

## 2015-10-29 MED ORDER — CARVEDILOL 6.25 MG PO TABS: 6.2500 mg | ORAL_TABLET | Freq: Two times a day (BID) | ORAL | Status: DC

## 2015-10-29 MED ORDER — LOSARTAN POTASSIUM 25 MG PO TABS: 25.0000 mg | ORAL_TABLET | Freq: Every day | ORAL | Status: DC

## 2015-10-29 NOTE — Telephone Encounter (Signed)
Please call,says he needs to talk to you about his medicine.

## 2015-10-29 NOTE — Telephone Encounter (Signed)
Spoke with patient and he asked that Coreg, HCTZ, and Losartan Rx's be printed and mailed to him Reviewed Rx's and sig, printed and mailed as requested

## 2015-12-17 ENCOUNTER — Encounter: Payer: Medicare Other | Admitting: Internal Medicine

## 2016-01-05 ENCOUNTER — Encounter: Payer: Self-pay | Admitting: Internal Medicine

## 2016-01-05 ENCOUNTER — Ambulatory Visit (INDEPENDENT_AMBULATORY_CARE_PROVIDER_SITE_OTHER): Payer: Medicare Other | Admitting: Internal Medicine

## 2016-01-05 VITALS — BP 128/78 | HR 70 | Ht 70.5 in | Wt 207.6 lb

## 2016-01-05 DIAGNOSIS — I5022 Chronic systolic (congestive) heart failure: Secondary | ICD-10-CM

## 2016-01-05 LAB — CUP PACEART INCLINIC DEVICE CHECK
Brady Statistic AP VP Percent: 0.1 %
Brady Statistic AP VS Percent: 98.5 %
Brady Statistic AS VP Percent: 0.1 % — CL
HIGH POWER IMPEDANCE MEASURED VALUE: 51 Ohm
Implantable Lead Implant Date: 20090908
Implantable Lead Implant Date: 20090908
Implantable Lead Location: 753859
Implantable Lead Model: 5076
Implantable Lead Model: 6947
Lead Channel Impedance Value: 456 Ohm
Lead Channel Impedance Value: 912 Ohm
Lead Channel Pacing Threshold Pulse Width: 0.4 ms
Lead Channel Pacing Threshold Pulse Width: 0.4 ms
Lead Channel Sensing Intrinsic Amplitude: 4.8 mV
Lead Channel Setting Pacing Amplitude: 2 V
Lead Channel Setting Pacing Amplitude: 2.5 V
MDC IDC LEAD LOCATION: 753860
MDC IDC MSMT LEADCHNL RA PACING THRESHOLD AMPLITUDE: 0.5 V
MDC IDC MSMT LEADCHNL RV PACING THRESHOLD AMPLITUDE: 1 V
MDC IDC MSMT LEADCHNL RV SENSING INTR AMPL: 13.4 mV
MDC IDC SESS DTM: 20170502164916
MDC IDC SET LEADCHNL RV PACING PULSEWIDTH: 0.4 ms
MDC IDC SET LEADCHNL RV SENSING SENSITIVITY: 0.3 mV
MDC IDC STAT BRADY AS VS PERCENT: 1.3 %

## 2016-01-05 NOTE — Progress Notes (Signed)
HPI Rodney Lee returns today for followup. He is a very pleasant 75 year old man with a long-standing ischemic cardiomyopathy, chronic systolic heart failure, VT, diabetes, and hypertension. He is status post ICD implantation. In the interim, he has done well. He denies chest pain, shortness of breath, or peripheral edema. No syncope.no ICD shock. He works driving cars and making peanut brittle. Allergies  Allergen Reactions  . Lipitor [Atorvastatin Calcium]     Memory problems     Current Outpatient Prescriptions  Medication Sig Dispense Refill  . carvedilol (COREG) 6.25 MG tablet Take 1 tablet (6.25 mg total) by mouth 2 (two) times daily with a meal. 180 tablet 1  . finasteride (PROSCAR) 5 MG tablet Take 5 mg by mouth daily.    Marland Kitchen glimepiride (AMARYL) 4 MG tablet Take 4 mg by mouth daily before breakfast.    . hydrochlorothiazide (MICROZIDE) 12.5 MG capsule TAKE 1 CAPSULE BY MOUTH EVERY NIGHT 90 capsule 1  . levothyroxine (SYNTHROID, LEVOTHROID) 137 MCG tablet Take 137 mcg by mouth daily.     Marland Kitchen losartan (COZAAR) 25 MG tablet Take 1 tablet (25 mg total) by mouth daily. 90 tablet 1  . metFORMIN (GLUCOPHAGE) 500 MG tablet Take 500 mg by mouth 2 (two) times daily with a meal. Take one tablet in the morning and one tablet at night.    . Multiple Vitamin (MULTIVITAMIN) tablet Take 1 tablet by mouth daily.      . nitroGLYCERIN (NITROSTAT) 0.4 MG SL tablet Place 1 tablet (0.4 mg total) under the tongue every 5 (five) minutes as needed for chest pain. 25 tablet prn  . oxybutynin (DITROPAN-XL) 10 MG 24 hr tablet Take 10 mg by mouth daily.    . Rivaroxaban (XARELTO) 20 MG TABS tablet Take 1 tablet (20 mg total) by mouth daily with supper. 30 tablet 11  . simvastatin (ZOCOR) 40 MG tablet Take 40 mg by mouth daily.    . Tamsulosin HCl (FLOMAX) 0.4 MG CAPS Take 0.4 mg by mouth daily.      . vitamin C (ASCORBIC ACID) 500 MG tablet Take 500 mg by mouth daily.       No current facility-administered  medications for this visit.     Past Medical History  Diagnosis Date  . Diabetes mellitus   . Hypertension   . Hyperlipidemia   . Hypothyroidism   . Exogenous obesity   . MI, old     ANTEROLATERAL  . CHF (congestive heart failure) ([place])   . Sinus bradycardia   . CAD (coronary artery disease)   . cardiomyopathy     MDT ICD, Dr. Lovena Le 2009  . Atrial fibrillation ([place])     CHADS2Vasc is at least 5, on Xarelto    ROS:   All systems reviewed and negative except as noted in the HPI.   Past Surgical History  Procedure Laterality Date  . Cardiac catheterization  03/13/1994    ACUTE MI WITH TOTAL OCCLUSION OF MID LAD. REPERFUSION AFTER CROSSING WITH A GUIDEWIRE. POOR RIGHT TO LEFT COLLATERAL FLOW  . Cardiac defibrillator placement    . US echocardiography  04/26/2010    EF 35-40%. MODERATE LVH WITH MODERATE GLOBAL LV SYSTOLIC DYSFUNCTION AND IMPAIRED RELAXATION. MILD AORTIC SCLEROSIS WITHOUT STENOSIS. NORMAL PULMONARY ARTERY PRESSURE.  Marland Kitchen US echocardiography  10/07/2004    EF 30-35%  . Cardiovascular stress test  03/11/2008    EF 30%. NO REVERSIBLE ISCHEMIA  . Angioplasty    . Ep implantable device N/A 09/28/2015  Procedure:  ICD Generator Changeout;  Surgeon: Evans Lance, MD;  Location: [facility];  Service: Cardiovascular;  Laterality: N/A;     Family History  Problem Relation Age of Onset  . Heart disease Mother   . Heart attack Mother      Social History   Social History  . Marital Status: Married    Spouse Name: N/A  . Number of Children: N/A  . Years of Education: N/A   Occupational History  . Not on file.   Social History Main Topics  . Smoking status: Never Smoker   . Smokeless tobacco: Not on file  . Alcohol Use: No  . Drug Use: No  . Sexual Activity: Not on file   Other Topics Concern  . Not on file   Social History Narrative     BP 128/78 mmHg  Pulse 70  Ht 5' 10.5" (1.791 m)  Wt 207 lb 9.6 oz (94.167 kg)  BMI 29.36  kg/m2  Physical Exam:  Well appearing 75 year old man,NAD HEENT: Unremarkable Neck:  7 cm JVD, no thyromegally Lungs:  Clear with no wheezes, rales, or rhonchi. Well-healed ICD incision. HEART:  Regular rate rhythm, no murmurs, no rubs, no clicks Abd:  soft, positive bowel sounds, no organomegally, no rebound, no guarding Ext:  2 plus pulses, no edema, no cyanosis, no clubbing Skin:  No rashes no nodules Neuro:  CN II through XII intact, motor grossly intact  DEVICE  Normal device function.  See PaceArt for details.   ECG - nsr with atrial pacing  Assess/Plan: 1. VT - he has had no recurrent arrhythmias. No change in meds. 2. ICD - his Medtronic DDD ICD is working normally. 3. Chronic systolic heart failure - his symptoms are class 2. He will continue his current meds.   Rodney Lee.D.

## 2016-01-05 NOTE — Patient Instructions (Signed)
Medication Instructions:  Your physician recommends that you continue on your current medications as directed. Please refer to the Current Medication list given to you today.   Labwork: None ordered   Testing/Procedures: None ordered   Follow-Up: Your physician wants you to follow-up in: 12 months with Dr Knox Saliva will receive a reminder letter in the mail two months in advance. If you don't receive a letter, please call our office to schedule the follow-up appointment.  Remote monitoring is used to monitor your  ICD from home. This monitoring reduces the number of office visits required to check your device to one time per year. It allows [place] to keep an eye on the functioning of your device to ensure it is working properly. You are scheduled for a device check from home on 04/05/16. You may send your transmission at any time that day. If you have a wireless device, the transmission will be sent automatically. After your physician reviews your transmission, you will receive a postcard with your next transmission date.     Any Other Special Instructions Will Be Listed Below (If Applicable).     If you need a refill on your cardiac medications before your next appointment, please call your pharmacy.

## 2016-04-05 ENCOUNTER — Ambulatory Visit (INDEPENDENT_AMBULATORY_CARE_PROVIDER_SITE_OTHER): Payer: Medicare Other | Admitting: *Deleted

## 2016-04-05 DIAGNOSIS — Z95810 Presence of automatic (implantable) cardiac defibrillator: Secondary | ICD-10-CM | POA: Diagnosis not present

## 2016-04-05 DIAGNOSIS — I255 Ischemic cardiomyopathy: Secondary | ICD-10-CM

## 2016-04-05 NOTE — Progress Notes (Signed)
Remote ICD transmission.   

## 2016-04-08 LAB — CUP PACEART REMOTE DEVICE CHECK
Battery Voltage: 3.04 V
Brady Statistic AP VP Percent: 0.13 %
Brady Statistic RA Percent Paced: 98.36 %
Date Time Interrogation Session: 20170801063324
HIGH POWER IMPEDANCE MEASURED VALUE: 66 Ohm
HighPow Impedance: 50 Ohm
Implantable Lead Implant Date: 20090908
Implantable Lead Location: 753860
Implantable Lead Model: 5076
Implantable Lead Model: 6947
Lead Channel Impedance Value: 817 Ohm
Lead Channel Impedance Value: 855 Ohm
Lead Channel Pacing Threshold Amplitude: 0.625 V
Lead Channel Pacing Threshold Pulse Width: 0.4 ms
Lead Channel Sensing Intrinsic Amplitude: 4.625 mV
Lead Channel Setting Sensing Sensitivity: 0.3 mV
MDC IDC LEAD IMPLANT DT: 20090908
MDC IDC LEAD LOCATION: 753859
MDC IDC MSMT BATTERY REMAINING LONGEVITY: 112 mo
MDC IDC MSMT LEADCHNL RA IMPEDANCE VALUE: 456 Ohm
MDC IDC MSMT LEADCHNL RA SENSING INTR AMPL: 4.625 mV
MDC IDC MSMT LEADCHNL RV PACING THRESHOLD AMPLITUDE: 1 V
MDC IDC MSMT LEADCHNL RV PACING THRESHOLD PULSEWIDTH: 0.4 ms
MDC IDC MSMT LEADCHNL RV SENSING INTR AMPL: 10.375 mV
MDC IDC MSMT LEADCHNL RV SENSING INTR AMPL: 10.375 mV
MDC IDC SET LEADCHNL RA PACING AMPLITUDE: 2 V
MDC IDC SET LEADCHNL RV PACING AMPLITUDE: 2.5 V
MDC IDC SET LEADCHNL RV PACING PULSEWIDTH: 0.4 ms
MDC IDC STAT BRADY AP VS PERCENT: 98.23 %
MDC IDC STAT BRADY AS VP PERCENT: 0 %
MDC IDC STAT BRADY AS VS PERCENT: 1.64 %
MDC IDC STAT BRADY RV PERCENT PACED: 0.13 %

## 2016-04-12 ENCOUNTER — Encounter: Payer: Self-pay | Admitting: Cardiology

## 2016-04-18 ENCOUNTER — Other Ambulatory Visit: Payer: Self-pay | Admitting: *Deleted

## 2016-04-18 MED ORDER — LOSARTAN POTASSIUM 25 MG PO TABS: 25.0000 mg | ORAL_TABLET | Freq: Every day | ORAL | 2 refills | Status: DC

## 2016-04-18 MED ORDER — CARVEDILOL 6.25 MG PO TABS: 6.2500 mg | ORAL_TABLET | Freq: Two times a day (BID) | ORAL | 2 refills | Status: DC

## 2016-04-30 ENCOUNTER — Emergency Department (HOSPITAL_COMMUNITY)
Admission: EM | Admit: 2016-04-30 | Discharge: 2016-04-30 | Disposition: A | Discharge status: Home / Self Care | Payer: Medicare Other | Attending: Emergency Medicine | Admitting: Emergency Medicine

## 2016-04-30 ENCOUNTER — Emergency Department (HOSPITAL_COMMUNITY): Payer: Medicare Other

## 2016-04-30 ENCOUNTER — Encounter (HOSPITAL_COMMUNITY): Payer: Self-pay

## 2016-04-30 DIAGNOSIS — E119 Type 2 diabetes mellitus without complications: Secondary | ICD-10-CM | POA: Diagnosis not present

## 2016-04-30 DIAGNOSIS — I472 Ventricular tachycardia, unspecified: Secondary | ICD-10-CM

## 2016-04-30 DIAGNOSIS — I252 Old myocardial infarction: Secondary | ICD-10-CM | POA: Insufficient documentation

## 2016-04-30 DIAGNOSIS — Z7984 Long term (current) use of oral hypoglycemic drugs: Secondary | ICD-10-CM | POA: Insufficient documentation

## 2016-04-30 DIAGNOSIS — I4891 Unspecified atrial fibrillation: Secondary | ICD-10-CM

## 2016-04-30 DIAGNOSIS — Z79899 Other long term (current) drug therapy: Secondary | ICD-10-CM | POA: Diagnosis not present

## 2016-04-30 DIAGNOSIS — E039 Hypothyroidism, unspecified: Secondary | ICD-10-CM | POA: Diagnosis not present

## 2016-04-30 DIAGNOSIS — I5022 Chronic systolic (congestive) heart failure: Secondary | ICD-10-CM

## 2016-04-30 DIAGNOSIS — I2510 Atherosclerotic heart disease of native coronary artery without angina pectoris: Secondary | ICD-10-CM | POA: Diagnosis not present

## 2016-04-30 DIAGNOSIS — I4729 Other ventricular tachycardia: Secondary | ICD-10-CM

## 2016-04-30 DIAGNOSIS — Z95810 Presence of automatic (implantable) cardiac defibrillator: Secondary | ICD-10-CM | POA: Diagnosis not present

## 2016-04-30 DIAGNOSIS — Z7901 Long term (current) use of anticoagulants: Secondary | ICD-10-CM | POA: Insufficient documentation

## 2016-04-30 DIAGNOSIS — I110 Hypertensive heart disease with heart failure: Secondary | ICD-10-CM | POA: Insufficient documentation

## 2016-04-30 DIAGNOSIS — I499 Cardiac arrhythmia, unspecified: Secondary | ICD-10-CM | POA: Diagnosis present

## 2016-04-30 LAB — PROTIME-INR
INR: 1.35
PROTHROMBIN TIME: 16.8 s — AB (ref 11.4–15.2)

## 2016-04-30 LAB — CBC WITH DIFFERENTIAL/PLATELET
BASOS PCT: 1 %
Basophils Absolute: 0.1 10*3/uL (ref 0.0–0.1)
EOS ABS: 0.2 10*3/uL (ref 0.0–0.7)
Eosinophils Relative: 2 %
HCT: 49 % (ref 39.0–52.0)
Hemoglobin: 16.6 g/dL (ref 13.0–17.0)
Lymphocytes Relative: 19 %
Lymphs Abs: 2.1 10*3/uL (ref 0.7–4.0)
MCH: 36.3 pg — ABNORMAL HIGH (ref 26.0–34.0)
MCHC: 33.9 g/dL (ref 30.0–36.0)
MCV: 107.2 fL — ABNORMAL HIGH (ref 78.0–100.0)
MONO ABS: 1.2 10*3/uL — AB (ref 0.1–1.0)
MONOS PCT: 11 %
Neutro Abs: 7.5 10*3/uL (ref 1.7–7.7)
Neutrophils Relative %: 67 %
Platelets: 148 10*3/uL — ABNORMAL LOW (ref 150–400)
RBC: 4.57 MIL/uL (ref 4.22–5.81)
RDW: 15.1 % (ref 11.5–15.5)
WBC: 11 10*3/uL — ABNORMAL HIGH (ref 4.0–10.5)

## 2016-04-30 LAB — I-STAT CHEM 8, ED
BUN: 39 mg/dL — AB (ref 6–20)
CREATININE: 2 mg/dL — AB (ref 0.61–1.24)
Calcium, Ion: 1.11 mmol/L — ABNORMAL LOW (ref 1.12–1.23)
Chloride: 104 mmol/L (ref 101–111)
Glucose, Bld: 219 mg/dL — ABNORMAL HIGH (ref 65–99)
HEMATOCRIT: 51 % (ref 39.0–52.0)
Hemoglobin: 17.3 g/dL — ABNORMAL HIGH (ref 13.0–17.0)
POTASSIUM: 5 mmol/L (ref 3.5–5.1)
Sodium: 141 mmol/L (ref 135–145)
TCO2: 24 mmol/L (ref 0–100)

## 2016-04-30 LAB — BASIC METABOLIC PANEL
ANION GAP: 12 (ref 5–15)
BUN: 25 mg/dL — ABNORMAL HIGH (ref 6–20)
CO2: 20 mmol/L — AB (ref 22–32)
Calcium: 9.9 mg/dL (ref 8.9–10.3)
Chloride: 106 mmol/L (ref 101–111)
Creatinine, Ser: 2.12 mg/dL — ABNORMAL HIGH (ref 0.61–1.24)
GFR calc Af Amer: 33 mL/min — ABNORMAL LOW (ref >60)
GFR calc non Af Amer: 29 mL/min — ABNORMAL LOW (ref >60)
GLUCOSE: 217 mg/dL — AB (ref 65–99)
POTASSIUM: 4.5 mmol/L (ref 3.5–5.1)
Sodium: 138 mmol/L (ref 135–145)

## 2016-04-30 LAB — TROPONIN I: Troponin I: <0.03 ng/mL (ref <0.03)

## 2016-04-30 LAB — I-STAT TROPONIN, ED: Troponin i, poc: 0 ng/mL (ref 0.00–0.08)

## 2016-04-30 LAB — HEPATIC FUNCTION PANEL
ALK PHOS: 71 U/L (ref 38–126)
ALT: 27 U/L (ref 17–63)
AST: 31 U/L (ref 15–41)
Albumin: 4.3 g/dL (ref 3.5–5.0)
BILIRUBIN DIRECT: 0.2 mg/dL (ref 0.1–0.5)
BILIRUBIN INDIRECT: 1 mg/dL — AB (ref 0.3–0.9)
BILIRUBIN TOTAL: 1.2 mg/dL (ref 0.3–1.2)
Total Protein: 7.5 g/dL (ref 6.5–8.1)

## 2016-04-30 LAB — TSH: TSH: 1.075 u[IU]/mL (ref 0.350–4.500)

## 2016-04-30 LAB — BRAIN NATRIURETIC PEPTIDE: B NATRIURETIC PEPTIDE 5: 52 pg/mL (ref 0.0–100.0)

## 2016-04-30 LAB — MAGNESIUM: Magnesium: 2.3 mg/dL (ref 1.7–2.4)

## 2016-04-30 MED ORDER — METOPROLOL TARTRATE 5 MG/5ML IV SOLN
5.0000 mg | Freq: Once | INTRAVENOUS | Status: DC
  Filled 2016-04-30: qty 5

## 2016-04-30 NOTE — H&P (Signed)
History and Physical   Patient ID: Rodney Lee MRN: [id number], DOB/AGE: 10-16-1940 75 y.o. Date of Encounter: 04/30/2016  Primary Physician: Simona Huh, MD Primary Cardiologist: Dr Lovena Le  Chief Complaint:  Tachycardia  HPI: Rodney Lee is a 75 y.o. male with a history of ICM, chronic systolic CHF, CAD (last intervention was in the 1990's), DM, HTN, hypothyroid, and PAFib on Xarelto.  He has been doing well, no chest pain or SOB. No problems with edema, denies orthopnea or PND. No weight change, compliant with meds and low sodium diet. No bleeding issues.  Today, he was mowing the yard, weed-eating, etc. He had sudden onset of SOB, weakness and a stinging chest pain, not severe. His wife made him come to the ER, where his HR was 178, With an almost WCT.  He currently feels bad, weak and still with some chest discomfort, SOB w/ exertion.   Past Medical History:  Diagnosis Date  . Atrial fibrillation ([place])    CHADS2Vasc is at least 5, on Xarelto  . CAD (coronary artery disease)   . cardiomyopathy    MDT ICD, Dr. Lovena Le 2009  . CHF (congestive heart failure) ([place])   . Diabetes mellitus   . Exogenous obesity   . Hyperlipidemia   . Hypertension   . Hypothyroidism   . MI, old    ANTEROLATERAL  . Sinus bradycardia     Surgical History:  Past Surgical History:  Procedure Laterality Date  . ANGIOPLASTY    . CARDIAC CATHETERIZATION  03/13/1994   ACUTE MI WITH TOTAL OCCLUSION OF MID LAD. REPERFUSION AFTER CROSSING WITH A GUIDEWIRE. POOR RIGHT TO LEFT COLLATERAL FLOW  . CARDIAC DEFIBRILLATOR PLACEMENT    . CARDIOVASCULAR STRESS TEST  03/11/2008   EF 30%. NO REVERSIBLE ISCHEMIA  . EP IMPLANTABLE DEVICE N/A 09/28/2015   Procedure:  ICD Generator Changeout;  Surgeon: Evans Lance, MD;  Location: [facility];  Service: Cardiovascular;  Laterality: N/A;  . US ECHOCARDIOGRAPHY  04/26/2010   EF 35-40%. MODERATE LVH WITH MODERATE GLOBAL LV SYSTOLIC DYSFUNCTION AND  IMPAIRED RELAXATION. MILD AORTIC SCLEROSIS WITHOUT STENOSIS. NORMAL PULMONARY ARTERY PRESSURE.  Marland Kitchen US ECHOCARDIOGRAPHY  10/07/2004   EF 30-35%     I have reviewed the patient's current medications. Prior to Admission medications   Medication Sig Start Date End Date Taking? Authorizing Provider  carvedilol (COREG) 6.25 MG tablet Take 1 tablet (6.25 mg total) by mouth 2 (two) times daily with a meal. 04/18/16   Evans Lance, MD  finasteride (PROSCAR) 5 MG tablet Take 5 mg by mouth daily. 05/30/15   Historical Provider, MD  glimepiride (AMARYL) 4 MG tablet Take 4 mg by mouth daily before breakfast. 05/30/15   Historical Provider, MD  hydrochlorothiazide (MICROZIDE) 12.5 MG capsule TAKE 1 CAPSULE BY MOUTH EVERY NIGHT 10/29/15   Darlin Coco, MD  levothyroxine (SYNTHROID, LEVOTHROID) 137 MCG tablet Take 137 mcg by mouth daily.     Historical Provider, MD  losartan (COZAAR) 25 MG tablet Take 1 tablet (25 mg total) by mouth daily. 04/18/16   Evans Lance, MD  metFORMIN (GLUCOPHAGE) 500 MG tablet Take 500 mg by mouth 2 (two) times daily with a meal. Take one tablet in the morning and one tablet at night.    Historical Provider, MD  Multiple Vitamin (MULTIVITAMIN) tablet Take 1 tablet by mouth daily.      Historical Provider, MD  nitroGLYCERIN (NITROSTAT) 0.4 MG SL tablet Place 1 tablet (0.4  mg total) under the tongue every 5 (five) minutes as needed for chest pain. 12/24/12   Darlin Coco, MD  oxybutynin (DITROPAN-XL) 10 MG 24 hr tablet Take 10 mg by mouth daily. 04/18/15   Historical Provider, MD  Rivaroxaban (XARELTO) 20 MG TABS tablet Take 1 tablet (20 mg total) by mouth daily with supper. 10/08/13   Evans Lance, MD  simvastatin (ZOCOR) 40 MG tablet Take 40 mg by mouth daily.    Historical Provider, MD  Tamsulosin HCl (FLOMAX) 0.4 MG CAPS Take 0.4 mg by mouth daily.      Historical Provider, MD  vitamin C (ASCORBIC ACID) 500 MG tablet Take 500 mg by mouth daily.      Historical Provider, MD    Scheduled Meds: Continuous Infusions: PRN Meds:.  Allergies:  Allergies  Allergen Reactions  . Lipitor [Atorvastatin Calcium]     Memory problems    Social History   Social History  . Marital status: Married    Spouse name: N/A  . Number of children: N/A  . Years of education: N/A   Occupational History  . Retired    Social History Main Topics  . Smoking status: Never Smoker  . Smokeless tobacco: Not on file  . Alcohol use No  . Drug use: No  . Sexual activity: Not on file   Other Topics Concern  . Not on file   Social History Narrative   Lives with wife    Family History  Problem Relation Age of Onset  . Heart disease Mother   . Heart attack Mother    Family Status  Relation Status  . Mother Deceased at age 52  . Father Alive  . Maternal Grandmother Deceased  . Maternal Grandfather Deceased  . Paternal Grandmother Deceased  . Paternal Grandfather Deceased    Review of Systems:   Full 14-point review of systems otherwise negative except as noted above.  Physical Exam: Blood pressure 116/74, pulse (!) 172, temperature 98.2 F (36.8 C), temperature source Oral, resp. rate 16, weight 207 lb (93.9 kg), SpO2 97 %. General: Well developed, well nourished,male in no acute distress. Head: Normocephalic, atraumatic, sclera non-icteric, no xanthomas, nares are without discharge. Dentition: fair Neck: No carotid bruits. JVD not elevated. No thyromegally Lungs: Good expansion bilaterally. without wheezes or rhonchi.  Heart: Rapid Regular rate and rhythm with S1 S2.  No S3 or S4.  No murmur, no rubs, or gallops appreciated. Abdomen: Soft, non-tender, non-distended with normoactive bowel sounds. No hepatomegaly. No rebound/guarding. No obvious abdominal masses. Msk:  Strength and tone appear normal for age. No joint deformities or effusions, no spine or costo-vertebral angle tenderness. Extremities: No clubbing or cyanosis. No edema.  Distal pedal pulses are 2+ in  4 extrem Neuro: Alert and oriented X 3. Moves all extremities spontaneously. No focal deficits noted. Psych:  Responds to questions appropriately with a normal affect. Skin: No rashes or lesions noted  Labs:   Lab Results  Component Value Date   WBC 11.0 (H) 04/30/2016   HGB 17.3 (H) 04/30/2016   HCT 51.0 04/30/2016   MCV 107.2 (H) 04/30/2016   PLT 148 (L) 04/30/2016    Recent Labs Lab 04/30/16 1642  NA 141  K 5.0  CL 104  BUN 39*  CREATININE 2.00*  GLUCOSE 219*    Recent Labs  04/30/16 1640  TROPIPOC 0.00   Echo: 08/2015 - Left ventricle: The cavity size was mildly dilated. Systolic   function was severely reduced. The estimated  ejection fraction   was in the range of 25% to 30%. Akinesis and scarring of the   mid-apicalanteroseptal, anterior, and inferoseptal myocardium.   Dyskinesis and scarring of the apical myocardium; consistent with   infarction in the distribution of the left anterior descending   coronary artery. Hypokinesis of the inferior myocardium. Acoustic   contrast opacification revealed no evidence ofthrombus. - Left atrium: The atrium was mildly dilated.  ECG: 08/25 WCT, rate 178, ventricular tach QRS duration 102 ms  ASSESSMENT AND PLAN:  Active Problems:   Ventricular tachycardia (paroxysmal) (H[facility]) - sudden onset, scar noted on previous echo - prior to onset, pt was having no ischemic/CHF sx - Dr Lovena Le to eval.  Signed, Rosaria Ferries, PA-C 04/30/2016 4:55 PM Beeper [phone]  EP attending  Patient seen and examined. Agree with the findings as noted above. The patient presents today with a almost wide QRS tachycardia, QRS duration just over 100 ms, with ICD interrogation demonstrating a Medtronic dual-chamber ICD. His rhythm diagnosis is ventricular tachycardia despite the relative narrow width, as he has clear-cut VA dissociation. His ventricular tachycardia is below the device detection rate which was at 200 bpm. Today we paced the  patient just faster than his tachycardia cycle length tach to sinus rhythm. Specifically, manual ventricular tachycardia pacing at 270 ms, pace the patient back into sinus rhythm. He tolerated this nicely. He denies any preceding chest pain or shortness of breath until the episode began just prior to his admission. He denies any worsening heart failure symptoms. Initial electrolytes were unremarkable in the emergency room. Assessment and plan 1. Ventricular tachycardia - the patient has been paced back to sinus rhythm. He will be observed for an hour. We will give him some intravenous Lopressor. He will be allowed to be discharged. 2. Chronic systolic heart failure - the patient appears to be euvolemic. He denies recent worsening shortness of breath or peripheral edema. He will continue his home medications. I've encouraged the patient to maintain a low-sodium diet. 3. ICD -  the patient's Medtronic dual-chamber ICD is working normally. His device was reprogrammed to a detection rate of 170 bpm. Ventricular tachycardia about this rate will result in anti-tachycardic pacing therapies. 4. Atrial fibrillation - the patient will continue systemic anticoagulation. At the current time, I am not recommending antiarrhythmic drug therapy.  Cristopher Peru, M.D.

## 2016-04-30 NOTE — ED Notes (Signed)
Paged cardiologist Dr. Lovena Le to nurse Clydene Fake

## 2016-04-30 NOTE — Discharge Instructions (Signed)
You have been treated in the emergency department by Dr. Lovena Le for a fast heart rate called ventricular tachycardia. Return to the emergency department if you have any recurrence of pain, lightheadedness, shortness of breath, feeling like he'll pass out or any other concerning symptoms. Continue your medications and schedule a follow-up appointment as directed by Dr. Lovena Le.

## 2016-04-30 NOTE — ED Provider Notes (Signed)
[facility] DEPT Provider Note   CSN: [id number] Arrival date & time: 04/30/16  1607     History   Chief Complaint Chief Complaint  Patient presents with  . Irregular Heart Beat    HPI Rodney Lee is a 75 y.o. male.  HPI Rodney Lee is a 75 y.o. male with a history of ICM, chronic systolic CHF, CAD (last intervention was in the 1990's), DM, HTN, hypothyroid, and PAFib on Xarelto. He has a pacer defibrillator Patient had been outside doing some yard work. He started to feel short of breath and a moderate amount of chest discomfort. The symptoms persisted. He went into the house and still was not feeling well. At that time HEENT his wife determined to come to the emergency department for evaluation. He did not have any syncopal episode. No nausea or vomiting. He continues to feel slightly short of breath and have a sensation of chest tightness. Past Medical History:  Diagnosis Date  . Atrial fibrillation ([place])    CHADS2Vasc is at least 5, on Xarelto  . CAD (coronary artery disease)   . cardiomyopathy    MDT ICD, Dr. Lovena Le 2009  . CHF (congestive heart failure) ([place])   . Diabetes mellitus   . Exogenous obesity   . Hyperlipidemia   . Hypertension   . Hypothyroidism   . MI, old    ANTEROLATERAL  . Sinus bradycardia     Patient Active Problem List   Diagnosis Date Noted  . Ventricular tachycardia (paroxysmal) ([place]) 01/21/2015  . Type II or unspecified type diabetes mellitus without mention of complication, uncontrolled 02/13/2014  . Atrial fibrillation ([place]) 10/08/2013  . Chronic ischemic heart disease, unspecified 06/20/2011  . Dyslipidemia 06/20/2011  . CARDIOMYOPATHY, ISCHEMIC 05/16/2009  . SINUS BRADYCARDIA 05/16/2009  . Chronic systolic CHF (congestive heart failure) ([place]) 05/16/2009  . Automatic implantable cardioverter-defibrillator in situ 05/16/2009    Past Surgical History:  Procedure Laterality Date  . ANGIOPLASTY    . CARDIAC CATHETERIZATION   03/13/1994   ACUTE MI WITH TOTAL OCCLUSION OF MID LAD. REPERFUSION AFTER CROSSING WITH A GUIDEWIRE. POOR RIGHT TO LEFT COLLATERAL FLOW  . CARDIAC DEFIBRILLATOR PLACEMENT    . CARDIOVASCULAR STRESS TEST  03/11/2008   EF 30%. NO REVERSIBLE ISCHEMIA  . EP IMPLANTABLE DEVICE N/A 09/28/2015   Procedure:  ICD Generator Changeout;  Surgeon: Evans Lance, MD;  Location: [facility];  Service: Cardiovascular;  Laterality: N/A;  . US ECHOCARDIOGRAPHY  04/26/2010   EF 35-40%. MODERATE LVH WITH MODERATE GLOBAL LV SYSTOLIC DYSFUNCTION AND IMPAIRED RELAXATION. MILD AORTIC SCLEROSIS WITHOUT STENOSIS. NORMAL PULMONARY ARTERY PRESSURE.  Marland Kitchen US ECHOCARDIOGRAPHY  10/07/2004   EF 30-35%       Home Medications    Prior to Admission medications   Medication Sig Start Date End Date Taking? Authorizing Provider  carvedilol (COREG) 6.25 MG tablet Take 1 tablet (6.25 mg total) by mouth 2 (two) times daily with a meal. 04/18/16   Evans Lance, MD  finasteride (PROSCAR) 5 MG tablet Take 5 mg by mouth daily. 05/30/15   Historical Provider, MD  glimepiride (AMARYL) 4 MG tablet Take 4 mg by mouth daily before breakfast. 05/30/15   Historical Provider, MD  hydrochlorothiazide (MICROZIDE) 12.5 MG capsule TAKE 1 CAPSULE BY MOUTH EVERY NIGHT 10/29/15   Darlin Coco, MD  levothyroxine (SYNTHROID, LEVOTHROID) 137 MCG tablet Take 137 mcg by mouth daily.     Historical Provider, MD  losartan (COZAAR) 25 MG tablet Take 1 tablet (  25 mg total) by mouth daily. 04/18/16   Evans Lance, MD  metFORMIN (GLUCOPHAGE) 500 MG tablet Take 500 mg by mouth 2 (two) times daily with a meal. Take one tablet in the morning and one tablet at night.    Historical Provider, MD  Multiple Vitamin (MULTIVITAMIN) tablet Take 1 tablet by mouth daily.      Historical Provider, MD  nitroGLYCERIN (NITROSTAT) 0.4 MG SL tablet Place 1 tablet (0.4 mg total) under the tongue every 5 (five) minutes as needed for chest pain. 12/24/12   Darlin Coco, MD    oxybutynin (DITROPAN-XL) 10 MG 24 hr tablet Take 10 mg by mouth daily. 04/18/15   Historical Provider, MD  Rivaroxaban (XARELTO) 20 MG TABS tablet Take 1 tablet (20 mg total) by mouth daily with supper. 10/08/13   Evans Lance, MD  simvastatin (ZOCOR) 40 MG tablet Take 40 mg by mouth daily.    Historical Provider, MD  Tamsulosin HCl (FLOMAX) 0.4 MG CAPS Take 0.4 mg by mouth daily.      Historical Provider, MD  vitamin C (ASCORBIC ACID) 500 MG tablet Take 500 mg by mouth daily.      Historical Provider, MD    Family History Family History  Problem Relation Age of Onset  . Heart disease Mother   . Heart attack Mother     Social History Social History  Substance Use Topics  . Smoking status: Never Smoker  . Smokeless tobacco: Not on file  . Alcohol use No     Allergies   Lipitor [atorvastatin calcium]   Review of Systems Review of Systems 10 Systems reviewed and are negative for acute change except as noted in the HPI.   Physical Exam Updated Vital Signs BP 107/65   Pulse 61   Temp 98.2 F (36.8 C) (Oral)   Resp 14   Wt 207 lb (93.9 kg)   SpO2 96%   BMI 29.28 kg/m   Physical Exam  Constitutional: He is oriented to person, place, and time.  Patient is mildly obese. He is nontoxic and alert. He does not have respiratory distress at rest. Color is good.  HENT:  Head: Normocephalic and atraumatic.  Mouth/Throat: Oropharynx is clear and moist.  Eyes: EOM are normal.  Cardiovascular:  Heart rate extremely fast and heart sounds are soft. Distal pulses are very fast and 1+.  Pulmonary/Chest: Effort normal and breath sounds normal.  Abdominal: Soft. He exhibits no distension. There is no tenderness.  Musculoskeletal: Normal range of motion. He exhibits no edema or tenderness.  Neurological: He is alert and oriented to person, place, and time. He exhibits normal muscle tone. Coordination normal.  Skin: Skin is warm and dry.  Psychiatric: He has a normal mood and affect.      ED Treatments / Results  Labs (all labs ordered are listed, but only abnormal results are displayed) Labs Reviewed  BASIC METABOLIC PANEL - Abnormal; Notable for the following:       Result Value   CO2 20 (*)    Glucose, Bld 217 (*)    BUN 25 (*)    Creatinine, Ser 2.12 (*)    GFR calc non Af Amer 29 (*)    GFR calc Af Amer 33 (*)    All other components within normal limits  HEPATIC FUNCTION PANEL - Abnormal; Notable for the following:    Indirect Bilirubin 1.0 (*)    All other components within normal limits  CBC WITH DIFFERENTIAL/PLATELET - Abnormal;  Notable for the following:    WBC 11.0 (*)    MCV 107.2 (*)    MCH 36.3 (*)    Platelets 148 (*)    Monocytes Absolute 1.2 (*)    All other components within normal limits  PROTIME-INR - Abnormal; Notable for the following:    Prothrombin Time 16.8 (*)    All other components within normal limits  I-STAT CHEM 8, ED - Abnormal; Notable for the following:    BUN 39 (*)    Creatinine, Ser 2.00 (*)    Glucose, Bld 219 (*)    Calcium, Ion 1.11 (*)    Hemoglobin 17.3 (*)    All other components within normal limits  BRAIN NATRIURETIC PEPTIDE  TROPONIN I  MAGNESIUM  TSH  I-STAT TROPOININ, ED    EKG  EKG Interpretation  Date/Time:  Saturday April 30 2016 16:13:04 EDT Ventricular Rate:  178 PR Interval:    QRS Duration: 102 QT Interval:  300 QTC Calculation: 516 R Axis:   -97 Text Interpretation:  Supraventricular tachycardia Possible Right ventricular hypertrophy Possible Inferior infarct , age undetermined Anterolateral infarct , age undetermined Abnormal ECG agree. Confirmed by Johnney Killian, MD, Jeannie Done [id number]) on 04/30/2016 4:19:51 PM       Radiology Dg Chest Port 1 View  Result Date: 04/30/2016 CLINICAL DATA:  Midsternal chest pain.  Tachycardia. EXAM: PORTABLE CHEST 1 VIEW COMPARISON:  05/14/2008 FINDINGS: Dual lead cardiac pacemaker unchanged. Cardiomediastinal silhouette is normal. Mediastinal contours appear  intact. There is no evidence of focal airspace consolidation, pleural effusion or pneumothorax. Low lung volumes with crowding of the interstitial markings. Osseous structures are without acute abnormality. Soft tissues are grossly normal. IMPRESSION: Mild crowding of the interstitial markings which may be due to pulmonary vascular congestion or low lung volumes. Electronically Signed   By: Fidela Salisbury M.D.   On: 04/30/2016 19:05    Procedures Procedures (including critical care time) CRITICAL CARE Performed by: Charlesetta Shanks   Total critical care time: 30 minutes  Critical care time was exclusive of separately billable procedures and treating other patients.  Critical care was necessary to treat or prevent imminent or life-threatening deterioration.  Critical care was time spent personally by me on the following activities: development of treatment plan with patient and/or surrogate as well as nursing, discussions with consultants, evaluation of patient's response to treatment, examination of patient, obtaining history from patient or surrogate, ordering and performing treatments and interventions, ordering and review of laboratory studies, ordering and review of radiographic studies, pulse oximetry and re-evaluation of patient's condition.  Medications Ordered in ED Medications  metoprolol (LOPRESSOR) injection 5 mg (5 mg Intravenous Not Given 04/30/16 1820)     Initial Impression / Assessment and Plan / ED Course  I have reviewed the triage vital signs and the nursing notes.  Pertinent labs & imaging results that were available during my care of the patient were reviewed by me and considered in my medical decision making (see chart for details).  Clinical Course   Consult: Cardiology. Suanne Marker advised of the patient's tachycardia and symptoms. She directly called Dr. Lovena Le who came to the emergency department with the Medtronic device and was able to convert the patient  electronically.  Final Clinical Impressions(s) / ED Diagnoses   Final diagnoses:  Ventricular tachycardia ([place])  Although patient's tachycardia had appearance of borderline for wide complex and more likely narrow complex by EKG, the Medtronic pacer interrogator used by Dr. Lovena Le differentiated the patient's rhythm as ventricular tachycardia.  He was able to convert the patient with overdrive pacing. The patient tolerated this very well without any associated symptoms. Sore to observe the patient for another hour and if he remains stable patient was appropriate for discharge. Patient has remained stable and asymptomatic.  New Prescriptions New Prescriptions   No medications on file     Charlesetta Shanks, MD 04/30/16 [phone]

## 2016-04-30 NOTE — ED Notes (Addendum)
Suanne Marker PA and Dr. Lovena Le at bedside  Dr. Lovena Le using [place] ICD interrogator

## 2016-04-30 NOTE — ED Notes (Signed)
Converted back to sinus rhythm.

## 2016-04-30 NOTE — ED Notes (Signed)
X-ray at bedside

## 2016-07-05 ENCOUNTER — Ambulatory Visit (INDEPENDENT_AMBULATORY_CARE_PROVIDER_SITE_OTHER): Payer: Medicare Other | Admitting: *Deleted

## 2016-07-05 DIAGNOSIS — I255 Ischemic cardiomyopathy: Secondary | ICD-10-CM

## 2016-07-06 NOTE — Progress Notes (Signed)
Remote ICD transmission.   

## 2016-07-13 ENCOUNTER — Encounter: Payer: Self-pay | Admitting: Cardiology

## 2016-08-04 LAB — CUP PACEART REMOTE DEVICE CHECK
Battery Remaining Longevity: 109 mo
Brady Statistic AP VS Percent: 98.51 %
Brady Statistic AS VS Percent: 1.36 %
HIGH POWER IMPEDANCE MEASURED VALUE: 61 Ohm
HighPow Impedance: 50 Ohm
Implantable Lead Implant Date: 20090908
Implantable Lead Location: 753859
Implantable Lead Model: 6947
Lead Channel Impedance Value: 437 Ohm
Lead Channel Pacing Threshold Amplitude: 0.625 V
Lead Channel Pacing Threshold Pulse Width: 0.4 ms
Lead Channel Pacing Threshold Pulse Width: 0.4 ms
Lead Channel Sensing Intrinsic Amplitude: 4.5 mV
Lead Channel Sensing Intrinsic Amplitude: 4.5 mV
MDC IDC LEAD IMPLANT DT: 20090908
MDC IDC LEAD LOCATION: 753860
MDC IDC MSMT BATTERY VOLTAGE: 3.02 V
MDC IDC MSMT LEADCHNL RV IMPEDANCE VALUE: 722 Ohm
MDC IDC MSMT LEADCHNL RV IMPEDANCE VALUE: 779 Ohm
MDC IDC MSMT LEADCHNL RV PACING THRESHOLD AMPLITUDE: 1 V
MDC IDC MSMT LEADCHNL RV SENSING INTR AMPL: 11 mV
MDC IDC MSMT LEADCHNL RV SENSING INTR AMPL: 11 mV
MDC IDC PG IMPLANT DT: 20170123
MDC IDC SESS DTM: 20171031082304
MDC IDC SET LEADCHNL RA PACING AMPLITUDE: 2 V
MDC IDC SET LEADCHNL RV PACING AMPLITUDE: 2.5 V
MDC IDC SET LEADCHNL RV PACING PULSEWIDTH: 0.4 ms
MDC IDC SET LEADCHNL RV SENSING SENSITIVITY: 0.3 mV
MDC IDC STAT BRADY AP VP PERCENT: 0.14 %
MDC IDC STAT BRADY AS VP PERCENT: 0 %
MDC IDC STAT BRADY RA PERCENT PACED: 98.64 %
MDC IDC STAT BRADY RV PERCENT PACED: 0.14 %

## 2016-08-22 ENCOUNTER — Telehealth: Payer: Self-pay | Admitting: *Deleted

## 2016-08-22 NOTE — Telephone Encounter (Signed)
VT with several bursts of ATP and 2 shocks 08/20/16 at 1:30pm.  Rodney Lee says that he was working on a Christmas ornament- replacing batteries and felt 2 shocks. He threw the ornament down because he thought it was interfering with the ICD. He did not have any dizziness or syncope. I made him aware of driving restrictions x 6 months per [place]. He reports taking all of his medications as prescribed. Will print the episode for Dr. Tanna Furry review.

## 2016-09-07 ENCOUNTER — Other Ambulatory Visit: Payer: Self-pay | Admitting: Internal Medicine

## 2016-10-04 ENCOUNTER — Ambulatory Visit (INDEPENDENT_AMBULATORY_CARE_PROVIDER_SITE_OTHER): Payer: Medicare Other | Admitting: *Deleted

## 2016-10-04 DIAGNOSIS — I255 Ischemic cardiomyopathy: Secondary | ICD-10-CM

## 2016-10-04 NOTE — Progress Notes (Signed)
Remote ICD transmission.   

## 2016-10-05 ENCOUNTER — Encounter: Payer: Self-pay | Admitting: Cardiology

## 2016-10-09 LAB — CUP PACEART REMOTE DEVICE CHECK
Battery Remaining Longevity: 105 mo
Brady Statistic AP VS Percent: 98.38 %
Brady Statistic AS VP Percent: 0 %
Brady Statistic RV Percent Paced: 0.09 %
HIGH POWER IMPEDANCE MEASURED VALUE: 46 Ohm
HighPow Impedance: 61 Ohm
Implantable Lead Implant Date: 20090908
Implantable Lead Model: 5076
Implantable Lead Model: 6947
Lead Channel Impedance Value: 456 Ohm
Lead Channel Impedance Value: 703 Ohm
Lead Channel Pacing Threshold Amplitude: 0.625 V
Lead Channel Pacing Threshold Pulse Width: 0.4 ms
Lead Channel Pacing Threshold Pulse Width: 0.4 ms
Lead Channel Sensing Intrinsic Amplitude: 1.25 mV
Lead Channel Sensing Intrinsic Amplitude: 10.375 mV
Lead Channel Setting Pacing Amplitude: 2 V
Lead Channel Setting Sensing Sensitivity: 0.3 mV
MDC IDC LEAD IMPLANT DT: 20090908
MDC IDC LEAD LOCATION: 753859
MDC IDC LEAD LOCATION: 753860
MDC IDC MSMT BATTERY VOLTAGE: 3.02 V
MDC IDC MSMT LEADCHNL RA SENSING INTR AMPL: 1.25 mV
MDC IDC MSMT LEADCHNL RV IMPEDANCE VALUE: 817 Ohm
MDC IDC MSMT LEADCHNL RV PACING THRESHOLD AMPLITUDE: 1 V
MDC IDC MSMT LEADCHNL RV SENSING INTR AMPL: 10.375 mV
MDC IDC PG IMPLANT DT: 20170123
MDC IDC SESS DTM: 20180130072824
MDC IDC SET LEADCHNL RV PACING AMPLITUDE: 2.5 V
MDC IDC SET LEADCHNL RV PACING PULSEWIDTH: 0.4 ms
MDC IDC STAT BRADY AP VP PERCENT: 0.08 %
MDC IDC STAT BRADY AS VS PERCENT: 1.54 %
MDC IDC STAT BRADY RA PERCENT PACED: 96.1 %

## 2016-10-14 ENCOUNTER — Ambulatory Visit (INDEPENDENT_AMBULATORY_CARE_PROVIDER_SITE_OTHER): Payer: Medicare Other | Admitting: Cardiovascular Disease

## 2016-10-14 ENCOUNTER — Encounter: Payer: Self-pay | Admitting: Cardiovascular Disease

## 2016-10-14 VITALS — BP 130/66 | HR 67 | Ht 70.0 in | Wt 195.0 lb

## 2016-10-14 DIAGNOSIS — I472 Ventricular tachycardia, unspecified: Secondary | ICD-10-CM

## 2016-10-14 DIAGNOSIS — I4729 Other ventricular tachycardia: Secondary | ICD-10-CM

## 2016-10-14 DIAGNOSIS — N183 Chronic kidney disease, stage 3 unspecified: Secondary | ICD-10-CM

## 2016-10-14 DIAGNOSIS — I2510 Atherosclerotic heart disease of native coronary artery without angina pectoris: Secondary | ICD-10-CM

## 2016-10-14 DIAGNOSIS — E785 Hyperlipidemia, unspecified: Secondary | ICD-10-CM

## 2016-10-14 DIAGNOSIS — E0822 Diabetes mellitus due to underlying condition with diabetic chronic kidney disease: Secondary | ICD-10-CM

## 2016-10-14 DIAGNOSIS — I5022 Chronic systolic (congestive) heart failure: Secondary | ICD-10-CM

## 2016-10-14 DIAGNOSIS — I447 Left bundle-branch block, unspecified: Secondary | ICD-10-CM

## 2016-10-14 DIAGNOSIS — I480 Paroxysmal atrial fibrillation: Secondary | ICD-10-CM | POA: Diagnosis not present

## 2016-10-14 DIAGNOSIS — Z95810 Presence of automatic (implantable) cardiac defibrillator: Secondary | ICD-10-CM

## 2016-10-14 NOTE — Patient Instructions (Signed)
Dr Croitoru recommends that you schedule a follow-up appointment in 1 year. You will receive a reminder letter in the mail two months in advance. If you don't receive a letter, please call our office to schedule the follow-up appointment.  If you need a refill on your cardiac medications before your next appointment, please call your pharmacy. 

## 2016-10-14 NOTE — Progress Notes (Signed)
Cardiology Office Note    Date:  10/16/2016   ID:  Rodney Lee, DOB 29-Aug-1941, MRN [id number]  PCP:  Simona Huh, MD  Cardiologist:   [name], MD   Chief Complaint  Patient presents with  . Follow-up    former pt of Dr. Mare Ferrari    History of Present Illness:  Rodney Lee is a 76 y.o. male here to establish general cardiology follow-up after Dr. Sherryl Barters retirement. Dr. Cristopher Peru follows his defibrillator.  He has a long-standing history of coronary artery disease, following a large anterolateral myocardial infarction in 1995 treated with angioplasty. He has not required repeat revascularization since that time. Ever since then he has had moderately-to-severely depressed left ventricular systolic function (EF 42-6%ST his most recent echo in December 2016), but has had very well compensated congestive heart failure. He received a defibrillator for primary prevention in 2009 and underwent a generator change out in January 2017.   In August 2017 he presented with weakness and was found to be in sustained VT at 178 bpm, relatively narrow QRS complex. He was treated with overdrive pacing via his defibrillator with improvement in symptoms. VT detection zone was dropped to [place] 170 bpm. On 08/20/2016 he had ventricular tachycardia with unsuccessful ATP and received 2 consecutive defibrillator shocks. He did not experience syncope. He believes this happened because there was interference from the flickering Christmas lights that he was trying to set up. He was advised not to drive for 6 months. His last defibrillator check was performed on February 4. The only other episode of arrhythmia since the shocks was a 2-second run of 10-beat nonsustained VT at 180 bpm that occurred December 20.   In the past, his device has detected asymptomatic paroxysmal atrial fibrillation. He takes anticoagulation with Xarelto and has not had bleeding problems  He is unaware of any palpitations  and denies syncope. He is quite active and does not have angina or dyspnea on exertion. 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 Appears to have NYHA functional class I. He denies leg edema or intermittent claudication. He has not had any focal neurological complaints.  He has hyperlipidemia and diabetes mellitus, followed by Dr. Marisue Humble. I don't have a copy of his most recent labs  Past Medical History:  Diagnosis Date  . Atrial fibrillation ([place])    CHADS2Vasc is at least 5, on Xarelto  . CAD (coronary artery disease)   . cardiomyopathy    MDT ICD, Dr. Lovena Le 2009  . CHF (congestive heart failure) ([place])   . Diabetes mellitus   . Exogenous obesity   . Hyperlipidemia   . Hypertension   . Hypothyroidism   . MI, old    ANTEROLATERAL  . Sinus bradycardia     Past Surgical History:  Procedure Laterality Date  . ANGIOPLASTY    . CARDIAC CATHETERIZATION  03/13/1994   ACUTE MI WITH TOTAL OCCLUSION OF MID LAD. REPERFUSION AFTER CROSSING WITH A GUIDEWIRE. POOR RIGHT TO LEFT COLLATERAL FLOW  . CARDIAC DEFIBRILLATOR PLACEMENT    . CARDIOVASCULAR STRESS TEST  03/11/2008   EF 30%. NO REVERSIBLE ISCHEMIA  . EP IMPLANTABLE DEVICE N/A 09/28/2015   Procedure:  ICD Generator Changeout;  Surgeon: Evans Lance, MD;  Location: [facility];  Service: Cardiovascular;  Laterality: N/A;  . US ECHOCARDIOGRAPHY  04/26/2010   EF 35-40%. MODERATE LVH WITH MODERATE GLOBAL LV SYSTOLIC DYSFUNCTION AND IMPAIRED  RELAXATION. MILD AORTIC SCLEROSIS WITHOUT STENOSIS. NORMAL PULMONARY ARTERY PRESSURE.  Marland Kitchen US ECHOCARDIOGRAPHY  10/07/2004   EF 30-35%    Current Medications: Outpatient Medications Prior to Visit  Medication Sig Dispense Refill  . carvedilol (COREG) 6.25 MG tablet Take 1 tablet (6.25 mg total) by mouth 2 (two) times daily with a meal. 180 tablet 2  . finasteride (PROSCAR) 5 MG tablet Take 5 mg by mouth daily.    Marland Kitchen glimepiride  (AMARYL) 4 MG tablet Take 4 mg by mouth daily before breakfast.    . hydrochlorothiazide (MICROZIDE) 12.5 MG capsule TAKE 1 CAPSULE BY MOUTH EVERY NIGHT 90 capsule 1  . losartan (COZAAR) 25 MG tablet Take 1 tablet (25 mg total) by mouth daily. 90 tablet 2  . metFORMIN (GLUCOPHAGE) 500 MG tablet Take 500 mg by mouth 2 (two) times daily with a meal. Take one tablet in the morning and one tablet at night.    . Multiple Vitamin (MULTIVITAMIN) tablet Take 1 tablet by mouth daily.      . nitroGLYCERIN (NITROSTAT) 0.4 MG SL tablet Place 1 tablet (0.4 mg total) under the tongue every 5 (five) minutes as needed for chest pain. 25 tablet prn  . oxybutynin (DITROPAN-XL) 10 MG 24 hr tablet Take 10 mg by mouth daily.    . Rivaroxaban (XARELTO) 20 MG TABS tablet Take 1 tablet (20 mg total) by mouth daily with supper. 30 tablet 11  . simvastatin (ZOCOR) 40 MG tablet Take 40 mg by mouth daily.    . Tamsulosin HCl (FLOMAX) 0.4 MG CAPS Take 0.4 mg by mouth daily.      . vitamin C (ASCORBIC ACID) 500 MG tablet Take 500 mg by mouth daily.      Marland Kitchen levothyroxine (SYNTHROID, LEVOTHROID) 137 MCG tablet Take 137 mcg by mouth daily.      No facility-administered medications prior to visit.      Allergies:   Lipitor [atorvastatin calcium]   Social History   Social History  . Marital status: Married    Spouse name: N/A  . Number of children: N/A  . Years of education: N/A   Occupational History  . Retired    Social History Main Topics  . Smoking status: Never Smoker  . Smokeless tobacco: Never Used  . Alcohol use No  . Drug use: No  . Sexual activity: Not on file   Other Topics Concern  . Not on file   Social History Narrative   Lives with wife     Family History:  The patient's family history includes Heart attack in his mother; Heart disease in his mother.   ROS:   Please see the history of present illness.    ROS All other systems reviewed and are negative.   PHYSICAL EXAM:   VS:  BP 130/66  (BP Location: Right Arm, Patient Position: Sitting, Cuff Size: Normal)   Pulse 67   Ht 5\' 10"  (1.778 m)   Wt 88.5 kg (195 lb)   BMI 27.98 kg/m    GEN: Well nourished, well developed, in no acute distress  HEENT: normal  Neck: no JVD, carotid bruits, or masses Cardiac: Paradoxically split S2, RRR; no murmurs, rubs, or gallops,no edema , healthy left subclavian defibrillator site Respiratory:  clear to auscultation bilaterally, normal work of breathing GI: soft, nontender, nondistended, + BS MS: no deformity or atrophy  Skin: warm and dry, no rash Neuro:  Alert and Oriented x 3, Strength and sensation are intact Psych: euthymic mood, full  affect  Wt Readings from Last 3 Encounters:  10/14/16 88.5 kg (195 lb)  04/30/16 93.9 kg (207 lb)  01/05/16 94.2 kg (207 lb 9.6 oz)      Studies/Labs Reviewed:   EKG:  EKG is ordered today.  The ekg ordered today demonstrates Atrial paced, ventricular sensed rhythm with a long AV delay, left bundle branch block, left axis deviation, QTC 414 ms  Recent Labs: 04/30/2016: ALT 27; B Natriuretic Peptide 52.0; BUN 39; Creatinine, Ser 2.00; Hemoglobin 17.3; Magnesium 2.3; Platelets 148; Potassium 5.0; Sodium 141; TSH 1.075   Lipid Panel    Component Value Date/Time   CHOL 242 (H) 06/20/2011 1039   TRIG 288.0 (H) 06/20/2011 1039   HDL 33.10 (L) 06/20/2011 1039   CHOLHDL 7 06/20/2011 1039   VLDL 57.6 (H) 06/20/2011 1039   LDLDIRECT 159.8 06/20/2011 1039    Additional studies/ records that were reviewed today include:  Records from last visit with Dr. Mare Ferrari and Dr. Lovena Le, from [place] office visit with Tommye Standard, remote device checks, echo    ASSESSMENT:    1. Chronic systolic CHF (congestive heart failure) ([place])   2. Ventricular tachycardia (paroxysmal) ([facility])   3. Paroxysmal atrial fibrillation ([place])   4. Coronary artery disease involving native coronary artery of native heart without angina pectoris   5. LBBB (left bundle branch block)     6. Automatic implantable cardioverter-defibrillator in situ   7. Dyslipidemia   8. Diabetes mellitus due to underlying condition with stage 3 chronic kidney disease, without long-term current use of insulin ([facility])   9. Stage 3 chronic kidney disease      PLAN:  In order of problems listed above:  1. VT: Most recent treatment from his defibrillator was in December. The arrhythmia was not associated with syncope, but has been symptomatic unsustained in the past. At this point he is only on moderate beta blocker therapy. Will discuss with Dr. Lovena Le whether there is any need for more aggressive antiarrhythmic intervention, but I suspect not at this time. 2. CHF: Appears clinically euvolemic without needing to take loop diuretics. NYHA functional class I. On beta blockers and angiotensin receptor blocker. His blood pressure today but appear to permit additional titration, in the past his blood pressure has been a little too low. I would first plan to increase the dose of beta blocker, if his blood pressure allows. 3. AFib: Asymptomatic arrhythmia previously detected by his device. On appropriate anticoagulation for CHADSVasc 5 (age 60, CAD, CHF, DM). No history of known stroke or TIA. 4. CAD: He has severe ischemic cardiac myopathy following an extensive anterolateral infarction about 20 years ago and has not had any events since that time. He does not have angina pectoris on minimal antianginal therapy. 5. LBBB: Option for grade 2 CRT if he should develop significant heart failure. 6. ICD: Followed in EP clinic by Dr. Lovena Le. 7. HLP: Results from PCP. Target LDL less than 70. 8. DM: Portably very well compensated, only on oral agents. Moderately overweight. Encouraged restriction of sweets and carbohydrates with low glycemic index, overall caloric restriction and effort to lose about 15-20 pounds 9. CKD: Levada Dy to have at least stage III chronic kidney disease based on previous labs. In August his  kidney function is probably worse than baseline when he was in sustained VT (creatinine 1.8-2.0 (previously 1.5-1.6). Will get his most recent results from PCP    Medication Adjustments/Labs and Tests Ordered: Current medicines are reviewed at length with the patient today.  Concerns regarding medicines are outlined above.  Medication changes, Labs and Tests ordered today are listed in the Patient Instructions below. Patient Instructions  Dr Sallyanne Kuster recommends that you schedule a follow-up appointment in 1 year. You will receive a reminder letter in the mail two months in advance. If you don't receive a letter, please call our office to schedule the follow-up appointment.  If you need a refill on your cardiac medications before your next appointment, please call your pharmacy.    Signed, [name], MD  10/16/2016 11:06 AM    [facility] [place], [place], [place]  [address] Phone: [phone]; Fax: [phone]

## 2016-10-16 ENCOUNTER — Encounter: Payer: Self-pay | Admitting: Cardiovascular Disease

## 2016-10-16 DIAGNOSIS — N183 Chronic kidney disease, stage 3 unspecified: Secondary | ICD-10-CM | POA: Insufficient documentation

## 2016-10-16 DIAGNOSIS — I447 Left bundle-branch block, unspecified: Secondary | ICD-10-CM | POA: Insufficient documentation

## 2016-10-16 DIAGNOSIS — I25119 Atherosclerotic heart disease of native coronary artery with unspecified angina pectoris: Secondary | ICD-10-CM | POA: Insufficient documentation

## 2016-12-10 ENCOUNTER — Other Ambulatory Visit: Payer: Self-pay | Admitting: Internal Medicine

## 2017-02-27 ENCOUNTER — Encounter (INDEPENDENT_AMBULATORY_CARE_PROVIDER_SITE_OTHER): Payer: Self-pay | Admitting: Orthopaedic Surgery

## 2017-02-27 ENCOUNTER — Telehealth: Payer: Self-pay | Admitting: Internal Medicine

## 2017-02-27 ENCOUNTER — Ambulatory Visit (INDEPENDENT_AMBULATORY_CARE_PROVIDER_SITE_OTHER): Payer: Medicare Other | Admitting: Orthopaedic Surgery

## 2017-02-27 DIAGNOSIS — M65332 Trigger finger, left middle finger: Secondary | ICD-10-CM | POA: Diagnosis not present

## 2017-02-27 MED ORDER — METHYLPREDNISOLONE ACETATE 40 MG/ML IJ SUSP
13.3300 mg | INTRAMUSCULAR | Status: AC | PRN
  Administered 2017-02-27: 13.33 mg

## 2017-02-27 MED ORDER — LIDOCAINE HCL 1 % IJ SOLN
0.3000 mL | INTRAMUSCULAR | Status: AC | PRN
  Administered 2017-02-27: .3 mL

## 2017-02-27 MED ORDER — BUPIVACAINE HCL 0.5 % IJ SOLN
0.3300 mL | INTRAMUSCULAR | Status: AC | PRN
  Administered 2017-02-27: .33 mL

## 2017-02-27 NOTE — Progress Notes (Signed)
Office Visit Note   Patient: Rodney Lee           Date of Birth: [birth year]           MRN: [id number] Visit Date: 02/27/2017              Requested by: Gaynelle Arabian, MD [address] [place], [place] [address] PCP: Gaynelle Arabian, MD   Assessment & Plan: Visit Diagnoses:  1. Trigger finger, left middle finger     Plan: Left middle trigger finger was injected today. Patient tolerates well. Follow up with me as needed.  Follow-Up Instructions: Return if symptoms worsen or fail to improve.   Orders:  No orders of the defined types were placed in this encounter.  No orders of the defined types were placed in this encounter.     Procedures: Hand/UE Inj Date/Time: 02/27/2017 9:18 AM Performed by: Leandrew Koyanagi Authorized by: Leandrew Koyanagi   Consent Given by:  Patient Timeout: prior to procedure the correct patient, procedure, and site was verified   Indications:  Pain Condition: trigger finger   Location:  Long finger (see office note for details) Prep: patient was prepped and draped in usual sterile fashion   Needle Size:  25 G Approach:  Volar Medications:  0.3 mL lidocaine 1 %; 0.33 mL bupivacaine 0.5 %; 13.33 mg methylPREDNISolone acetate 40 MG/ML     Clinical Data: No additional findings.   Subjective: Chief Complaint  Patient presents with  . Left Hand - Pain    Mr. Stegall is a 76 year old gentleman who comes in with a left middle trigger finger. He had this injected about 10-15 years ago with good relief. He comes back today with recurrence. He denies any injuries. Denies any radiation of pain    Review of Systems  Constitutional: Negative.   All other systems reviewed and are negative.    Objective: Vital Signs: There were no vitals taken for this visit.  Physical Exam  Constitutional: He is oriented to person, place, and time. He appears well-developed and well-nourished.  HENT:  Head: Normocephalic and atraumatic.    Eyes: Pupils are equal, round, and reactive to light.  Neck: Neck supple.  Pulmonary/Chest: Effort normal.  Abdominal: Soft.  Musculoskeletal: Normal range of motion.  Neurological: He is alert and oriented to person, place, and time.  Skin: Skin is warm.  Psychiatric: He has a normal mood and affect. His behavior is normal. Judgment and thought content normal.  Nursing note and vitals reviewed.   Ortho Exam Left hand exam shows triggering of the middle finger. There is no skin lesions. She has no real swelling. Specialty Comments:  No specialty comments available.  Imaging: No results found.   PMFS History: Patient Active Problem List   Diagnosis Date Noted  . Trigger finger, left middle finger 02/27/2017  . Coronary artery disease involving native coronary artery of native heart without angina pectoris 10/16/2016  . LBBB (left bundle branch block) 10/16/2016  . Stage 3 chronic kidney disease 10/16/2016  . Ventricular tachycardia (paroxysmal) ([place]) 01/21/2015  . Diabetes mellitus due to underlying condition with stage 3 chronic kidney disease ([place]) 02/13/2014  . Atrial fibrillation ([place]) 10/08/2013  . Chronic ischemic heart disease, unspecified 06/20/2011  . Dyslipidemia 06/20/2011  . CARDIOMYOPATHY, ISCHEMIC 05/16/2009  . SINUS BRADYCARDIA 05/16/2009  . Chronic systolic CHF (congestive heart failure) ([place]) 05/16/2009  . Automatic implantable cardioverter-defibrillator in situ 05/16/2009   Past Medical History:  Diagnosis Date  .  Atrial fibrillation ([place])    CHADS2Vasc is at least 5, on Xarelto  . CAD (coronary artery disease)   . cardiomyopathy    MDT ICD, Dr. Lovena Le 2009  . CHF (congestive heart failure) ([place])   . Diabetes mellitus   . Exogenous obesity   . Hyperlipidemia   . Hypertension   . Hypothyroidism   . MI, old    ANTEROLATERAL  . Sinus bradycardia     Family History  Problem Relation Age of Onset  . Heart disease Mother   . Heart attack Mother      Past Surgical History:  Procedure Laterality Date  . ANGIOPLASTY    . CARDIAC CATHETERIZATION  03/13/1994   ACUTE MI WITH TOTAL OCCLUSION OF MID LAD. REPERFUSION AFTER CROSSING WITH A GUIDEWIRE. POOR RIGHT TO LEFT COLLATERAL FLOW  . CARDIAC DEFIBRILLATOR PLACEMENT    . CARDIOVASCULAR STRESS TEST  03/11/2008   EF 30%. NO REVERSIBLE ISCHEMIA  . EP IMPLANTABLE DEVICE N/A 09/28/2015   Procedure:  ICD Generator Changeout;  Surgeon: Evans Lance, MD;  Location: [facility];  Service: Cardiovascular;  Laterality: N/A;  . US ECHOCARDIOGRAPHY  04/26/2010   EF 35-40%. MODERATE LVH WITH MODERATE GLOBAL LV SYSTOLIC DYSFUNCTION AND IMPAIRED RELAXATION. MILD AORTIC SCLEROSIS WITHOUT STENOSIS. NORMAL PULMONARY ARTERY PRESSURE.  Marland Kitchen US ECHOCARDIOGRAPHY  10/07/2004   EF 30-35%   Social History   Occupational History  . Retired    Social History Main Topics  . Smoking status: Never Smoker  . Smokeless tobacco: Never Used  . Alcohol use No  . Drug use: No  . Sexual activity: Not on file

## 2017-02-27 NOTE — Telephone Encounter (Signed)
Spoke with patient and he stated that he is requesting samples as he cannot afford the xarelto. The last time that this was refilled by Dr Lovena Le was back in 2015. I made patient aware that we are not able to supply samples on an ongoing basis and his reply was that if I cannot supply him with samples then he just wont take the medication. He went on to say that this office does not care about the patients anyway. He then proceeded to hang up on me.

## 2017-02-27 NOTE — Telephone Encounter (Signed)
Patient calling the office for samples of medication: ° ° °1.  What medication and dosage are you requesting samples for? Xarelto 20 mg ° °2.  Are you currently out of this medication?  ° ° °

## 2017-02-28 ENCOUNTER — Other Ambulatory Visit: Payer: Self-pay

## 2017-02-28 MED ORDER — RIVAROXABAN 20 MG PO TABS: 20.0000 mg | ORAL_TABLET | Freq: Every day | ORAL | 11 refills | Status: DC

## 2017-02-28 NOTE — Telephone Encounter (Signed)
**Note De-Identified Rodney Lee Obfuscation** I called the pt and asked him if anyone has every tried to do a PA on his Xarelto before and he stated "not that I know of". I then asked him if he wanted me to try to get the PA through his insurance company which will lower the cost of his Xarelto and he said that he would. He is aware that I have left him samples of Xarelto in the front office for him to pick up at his convenience and that Im working on the [employer]. He thanked me for my assistance.

## 2017-02-28 NOTE — Telephone Encounter (Signed)
**Note De-Identified Rodney Lee Obfuscation** I have done a PA for Xarelto through covermymeds. Awaiting response.

## 2017-03-01 ENCOUNTER — Inpatient Hospital Stay (HOSPITAL_COMMUNITY)
Admission: EM | Admit: 2017-03-01 | Discharge: 2017-03-04 | DRG: 309 | Disposition: A | Discharge status: Home / Self Care | Payer: Medicare Other | Attending: Cardiology | Admitting: Cardiology

## 2017-03-01 ENCOUNTER — Emergency Department (HOSPITAL_COMMUNITY): Payer: Medicare Other

## 2017-03-01 ENCOUNTER — Encounter (HOSPITAL_COMMUNITY): Payer: Self-pay

## 2017-03-01 DIAGNOSIS — I472 Ventricular tachycardia, unspecified: Secondary | ICD-10-CM

## 2017-03-01 DIAGNOSIS — I4729 Other ventricular tachycardia: Secondary | ICD-10-CM

## 2017-03-01 DIAGNOSIS — N183 Chronic kidney disease, stage 3 unspecified: Secondary | ICD-10-CM | POA: Diagnosis present

## 2017-03-01 DIAGNOSIS — I255 Ischemic cardiomyopathy: Secondary | ICD-10-CM | POA: Diagnosis present

## 2017-03-01 DIAGNOSIS — Z7984 Long term (current) use of oral hypoglycemic drugs: Secondary | ICD-10-CM

## 2017-03-01 DIAGNOSIS — E0822 Diabetes mellitus due to underlying condition with diabetic chronic kidney disease: Secondary | ICD-10-CM | POA: Diagnosis present

## 2017-03-01 DIAGNOSIS — I4901 Ventricular fibrillation: Principal | ICD-10-CM | POA: Diagnosis present

## 2017-03-01 DIAGNOSIS — E785 Hyperlipidemia, unspecified: Secondary | ICD-10-CM | POA: Diagnosis present

## 2017-03-01 DIAGNOSIS — Z95810 Presence of automatic (implantable) cardiac defibrillator: Secondary | ICD-10-CM | POA: Diagnosis present

## 2017-03-01 DIAGNOSIS — E6609 Other obesity due to excess calories: Secondary | ICD-10-CM | POA: Diagnosis present

## 2017-03-01 DIAGNOSIS — I130 Hypertensive heart and chronic kidney disease with heart failure and stage 1 through stage 4 chronic kidney disease, or unspecified chronic kidney disease: Secondary | ICD-10-CM | POA: Diagnosis present

## 2017-03-01 DIAGNOSIS — I447 Left bundle-branch block, unspecified: Secondary | ICD-10-CM | POA: Diagnosis present

## 2017-03-01 DIAGNOSIS — I480 Paroxysmal atrial fibrillation: Secondary | ICD-10-CM | POA: Diagnosis present

## 2017-03-01 DIAGNOSIS — Z79890 Hormone replacement therapy (postmenopausal): Secondary | ICD-10-CM

## 2017-03-01 DIAGNOSIS — I2510 Atherosclerotic heart disease of native coronary artery without angina pectoris: Secondary | ICD-10-CM | POA: Diagnosis present

## 2017-03-01 DIAGNOSIS — Z79899 Other long term (current) drug therapy: Secondary | ICD-10-CM

## 2017-03-01 DIAGNOSIS — I5022 Chronic systolic (congestive) heart failure: Secondary | ICD-10-CM | POA: Diagnosis not present

## 2017-03-01 DIAGNOSIS — E1122 Type 2 diabetes mellitus with diabetic chronic kidney disease: Secondary | ICD-10-CM | POA: Diagnosis present

## 2017-03-01 DIAGNOSIS — Z6826 Body mass index (BMI) 26.0-26.9, adult: Secondary | ICD-10-CM

## 2017-03-01 DIAGNOSIS — I25119 Atherosclerotic heart disease of native coronary artery with unspecified angina pectoris: Secondary | ICD-10-CM | POA: Diagnosis present

## 2017-03-01 LAB — BASIC METABOLIC PANEL
Anion gap: 11 (ref 5–15)
BUN: 29 mg/dL — AB (ref 6–20)
CALCIUM: 9.2 mg/dL (ref 8.9–10.3)
CO2: 25 mmol/L (ref 22–32)
CREATININE: 1.76 mg/dL — AB (ref 0.61–1.24)
Chloride: 104 mmol/L (ref 101–111)
GFR calc Af Amer: 41 mL/min — ABNORMAL LOW (ref >60)
GFR calc non Af Amer: 36 mL/min — ABNORMAL LOW (ref >60)
Glucose, Bld: 238 mg/dL — ABNORMAL HIGH (ref 65–99)
Potassium: 4 mmol/L (ref 3.5–5.1)
SODIUM: 140 mmol/L (ref 135–145)

## 2017-03-01 LAB — MAGNESIUM: MAGNESIUM: 2.1 mg/dL (ref 1.7–2.4)

## 2017-03-01 LAB — I-STAT TROPONIN, ED: TROPONIN I, POC: 0.07 ng/mL (ref 0.00–0.08)

## 2017-03-01 LAB — CBC
HCT: 48.5 % (ref 39.0–52.0)
Hemoglobin: 16.3 g/dL (ref 13.0–17.0)
MCH: 36.5 pg — ABNORMAL HIGH (ref 26.0–34.0)
MCHC: 33.6 g/dL (ref 30.0–36.0)
MCV: 108.7 fL — AB (ref 78.0–100.0)
PLATELETS: 122 10*3/uL — AB (ref 150–400)
RBC: 4.46 MIL/uL (ref 4.22–5.81)
RDW: 15.3 % (ref 11.5–15.5)
WBC: 7.7 10*3/uL (ref 4.0–10.5)

## 2017-03-01 LAB — PROTIME-INR
INR: 1.09
Prothrombin Time: 14.1 seconds (ref 11.4–15.2)

## 2017-03-01 MED ORDER — CARVEDILOL 6.25 MG PO TABS
6.2500 mg | ORAL_TABLET | Freq: Two times a day (BID) | ORAL | Status: DC
  Administered 2017-03-02 – 2017-03-04 (×5): 6.25 mg via ORAL
  Filled 2017-03-01 (×5): qty 1

## 2017-03-01 MED ORDER — RIVAROXABAN 20 MG PO TABS: 20.0000 mg | ORAL_TABLET | Freq: Every day | ORAL | Status: DC

## 2017-03-01 MED ORDER — VITAMIN C 500 MG PO TABS
500.0000 mg | ORAL_TABLET | Freq: Every day | ORAL | Status: DC
  Administered 2017-03-02 – 2017-03-04 (×3): 500 mg via ORAL
  Filled 2017-03-01 (×3): qty 1

## 2017-03-01 MED ORDER — TAMSULOSIN HCL 0.4 MG PO CAPS
0.4000 mg | ORAL_CAPSULE | Freq: Every day | ORAL | Status: DC
  Administered 2017-03-02 – 2017-03-04 (×3): 0.4 mg via ORAL
  Filled 2017-03-01 (×3): qty 1

## 2017-03-01 MED ORDER — ADULT MULTIVITAMIN W/MINERALS CH
1.0000 | ORAL_TABLET | Freq: Every day | ORAL | Status: DC
  Administered 2017-03-02 – 2017-03-04 (×3): 1 via ORAL
  Filled 2017-03-01 (×3): qty 1

## 2017-03-01 MED ORDER — LEVOTHYROXINE SODIUM 25 MCG PO TABS
125.0000 ug | ORAL_TABLET | Freq: Every day | ORAL | Status: DC
  Administered 2017-03-02 – 2017-03-04 (×3): 125 ug via ORAL
  Filled 2017-03-01 (×4): qty 1

## 2017-03-01 MED ORDER — FINASTERIDE 5 MG PO TABS
5.0000 mg | ORAL_TABLET | Freq: Every day | ORAL | Status: DC
  Administered 2017-03-02 – 2017-03-04 (×3): 5 mg via ORAL
  Filled 2017-03-01 (×4): qty 1

## 2017-03-01 MED ORDER — OXYBUTYNIN CHLORIDE ER 10 MG PO TB24
10.0000 mg | ORAL_TABLET | Freq: Every day | ORAL | Status: DC
  Administered 2017-03-02 – 2017-03-04 (×3): 10 mg via ORAL
  Filled 2017-03-01 (×4): qty 1

## 2017-03-01 MED ORDER — ATORVASTATIN CALCIUM 80 MG PO TABS
80.0000 mg | ORAL_TABLET | Freq: Every day | ORAL | Status: DC
  Administered 2017-03-02 – 2017-03-03 (×2): 80 mg via ORAL
  Filled 2017-03-01 (×2): qty 1

## 2017-03-01 NOTE — ED Notes (Signed)
ED Provider at bedside. 

## 2017-03-01 NOTE — ED Triage Notes (Signed)
Patient here after defib has fired x 3 since late afternoon. denies CP but reports hasn't felt well today and blood sugar been running high today. Alert and oriented, NAD

## 2017-03-01 NOTE — H&P (Addendum)
History and Physical   Patient ID: Rodney Lee MRN: [id number], DOB/AGE: August 22, 1941 76 y.o. Date of Encounter: 03/01/2017  Primary Physician: Gaynelle Arabian, MD Primary Cardiologist: Dr Sallyanne Kuster, 10/14/2016  Chief Complaint:  VT/VF  HPI: Rodney Lee is a 76 y.o. male with a history of PAF, on Xarelto, S-CHF, DM, HTN, HLD, ICM w/ EF 25-30% by echo 08/2015, MI 1995  Pt was in [place] this am. He ran several errands. His CBG was > 300 but nothing else unusual.   He began feeling weak about noon. The weakness would be worse when he stood up. He was light-headed with standing. He took an extra Invokana to bring his sugar down. It worked, his sugar began trending down. CBG got down into the 170s and he went to eat dinner at about 6 pm.   He was not weak or light-headed, no palpitations, no CP or SOB. The ICD fired. He did not feel different afterwards. He turned and it went off again and then a 3rd time. He still did not have any symptoms.   He came to the ER because he was worried that it would keep going off and he had been told to come in if his device went off 3 times or more.  He feels well. No chest pain or SOB. No more weakness or presyncope. No palpitations at any time.   Past Medical History:  Diagnosis Date  . Atrial fibrillation ([place])    CHADS2Vasc is at least 5, on Xarelto  . CAD (coronary artery disease)   . cardiomyopathy    MDT ICD, Dr. Lovena Le 2009  . CHF (congestive heart failure) ([place])   . Diabetes mellitus   . Exogenous obesity   . Hyperlipidemia   . Hypertension   . Hypothyroidism   . MI, old [place], PTCA LAD per pt  . Sinus bradycardia     Surgical History:  Past Surgical History:  Procedure Laterality Date  . ANGIOPLASTY    . CARDIAC CATHETERIZATION  03/13/1994   ACUTE MI WITH TOTAL OCCLUSION OF MID LAD. REPERFUSION AFTER CROSSING WITH A GUIDEWIRE. POOR RIGHT TO LEFT COLLATERAL FLOW. Pt states had PTCA  . CARDIAC DEFIBRILLATOR  PLACEMENT    . CARDIOVASCULAR STRESS TEST  03/11/2008   EF 30%. NO REVERSIBLE ISCHEMIA  . EP IMPLANTABLE DEVICE N/A 09/28/2015   Procedure:  ICD Generator Changeout;  Surgeon: Evans Lance, MD;  Location: [facility];  Service: Cardiovascular;  Laterality: N/A;  . US ECHOCARDIOGRAPHY  04/26/2010   EF 35-40%. MODERATE LVH WITH MODERATE GLOBAL LV SYSTOLIC DYSFUNCTION AND IMPAIRED RELAXATION. MILD AORTIC SCLEROSIS WITHOUT STENOSIS. NORMAL PULMONARY ARTERY PRESSURE.  Marland Kitchen US ECHOCARDIOGRAPHY  10/07/2004   EF 30-35%     I have reviewed the patient's current medications. Medication Sig  atorvastatin (LIPITOR) 80 MG tablet Take 80 mg by mouth at bedtime.  Canagliflozin (INVOKANA PO) Take by mouth.  carvedilol (COREG) 6.25 MG tablet TAKE 1 TABLET BY MOUTH TWO  TIMES DAILY WITH MEALS  finasteride (PROSCAR) 5 MG tablet Take 5 mg by mouth daily.  glimepiride (AMARYL) 4 MG tablet Take 4 mg by mouth daily before breakfast.  hydrochlorothiazide (MICROZIDE) 12.5 MG capsule TAKE 1 CAPSULE BY MOUTH EVERY NIGHT Patient taking differently: Take 12.5 mg by mouth daily. TAKE 1 CAPSULE BY MOUTH EVERY NIGHT  levothyroxine (SYNTHROID, LEVOTHROID) 125 MCG tablet Take 125 mcg by mouth daily.  losartan (COZAAR) 25 MG tablet TAKE 1 TABLET BY  MOUTH  DAILY  metFORMIN (GLUCOPHAGE) 500 MG tablet Take 500 mg by mouth 2 (two) times daily with a meal. Take one tablet in the morning and one tablet at night.  Multiple Vitamin (MULTIVITAMIN) tablet Take 1 tablet by mouth daily.    oxybutynin (DITROPAN-XL) 10 MG 24 hr tablet Take 10 mg by mouth daily.  rivaroxaban (XARELTO) 20 MG TABS tablet Take 1 tablet (20 mg total) by mouth daily with supper.  Tamsulosin HCl (FLOMAX) 0.4 MG CAPS Take 0.4 mg by mouth daily.    vitamin C (ASCORBIC ACID) 500 MG tablet Take 500 mg by mouth daily.    nitroGLYCERIN (NITROSTAT) 0.4 MG SL tablet Place 1 tablet (0.4 mg total) under the tongue every 5 (five) minutes as needed for chest pain.    Scheduled Meds: Continuous Infusions: PRN Meds:.  Allergies:  Allergies  Allergen Reactions  . Lipitor [Atorvastatin Calcium]     Memory problems   Social History   Social History  . Marital status: Married    Spouse name: N/A  . Number of children: N/A  . Years of education: N/A   Occupational History  . Retired    Social History Main Topics  . Smoking status: Never Smoker  . Smokeless tobacco: Never Used  . Alcohol use No  . Drug use: No  . Sexual activity: Not on file   Other Topics Concern  . Not on file   Social History Narrative   Lives with wife    Family History  Problem Relation Age of Onset  . Heart disease Mother   . Heart attack Mother    Family Status  Relation Status  . Mother Deceased at age 55  . Father Alive  . MGM Deceased  . MGF Deceased  . PGM Deceased  . PGF Deceased    Review of Systems:   Full 14-point review of systems otherwise negative except as noted above.  Physical Exam: Blood pressure 122/77, pulse (!) 59, temperature 98.7 F (37.1 C), temperature source Oral, resp. rate 15, SpO2 97 %. General: Well developed, well nourished,male in no acute distress. Head: Normocephalic, atraumatic, sclera non-icteric, no xanthomas, nares are without discharge. Dentition: fair Neck: No carotid bruits. JVD not elevated. No thyromegally Lungs: Good expansion bilaterally. without wheezes or rhonchi.  Heart: Regular rate and rhythm with S1 S2.  No S3 or S4.  No murmur, no rubs, or gallops appreciated. Abdomen: Soft, non-tender, non-distended with normoactive bowel sounds. No hepatomegaly. No rebound/guarding. No obvious abdominal masses. Msk:  Strength and tone appear normal for age. No joint deformities or effusions, no spine or costo-vertebral angle tenderness. Extremities: No clubbing or cyanosis. No edema.  Distal pedal pulses are 2+ in 4 extrem Neuro: Alert and oriented X 3. Moves all extremities spontaneously. No focal deficits  noted. Psych:  Responds to questions appropriately with a normal affect. Skin: No rashes or lesions noted  Labs:   Lab Results  Component Value Date   WBC 7.7 03/01/2017   HGB 16.3 03/01/2017   HCT 48.5 03/01/2017   MCV 108.7 (H) 03/01/2017   PLT 122 (L) 03/01/2017    Recent Labs  03/01/17 1910  INR 1.09     Recent Labs Lab 03/01/17 1910  NA 140  K 4.0  CL 104  CO2 25  BUN 29*  CREATININE 1.76*  CALCIUM 9.2  GLUCOSE 238*   No results for input(s): CKTOTAL, CKMB, TROPONINI in the last 72 hours.  Recent Labs  03/01/17 1940  TROPIPOC  0.07   Lab Results  Component Value Date   CHOL 242 (H) 06/20/2011   HDL 33.10 (L) 06/20/2011   TRIG 288.0 (H) 06/20/2011    Radiology/Studies: Dg Chest Portable 1 View Result Date: 03/01/2017 CLINICAL DATA:  76 year old male status post defibrillator activation EXAM: PORTABLE CHEST 1 VIEW COMPARISON:  Prior chest x-ray 04/30/2016 FINDINGS: Left subclavian approach cardiac rhythm maintenance device. Leads project over the atrium and right ventricle. Stable cardiomegaly. Atherosclerotic calcifications present in the transverse aorta. No evidence of pulmonary edema, pleural effusion or pneumothorax. No new airspace opacity. No acute osseous abnormality. IMPRESSION: Stable chest x-ray without evidence of acute cardiopulmonary process. Stable position of left subclavian approach cardiac rhythm maintenance device. Stable cardiomegaly. Electronically Signed   By: Jacqulynn Cadet M.D.   On: 03/01/2017 19:41     Cardiac Cath: none since 1995  Echo: 09/01/2015 - Left ventricle: The cavity size was mildly dilated. Systolic   function was severely reduced. The estimated ejection fraction   was in the range of 25% to 30%. Akinesis and scarring of the   mid-apicalanteroseptal, anterior, and inferoseptal myocardium.   Dyskinesis and scarring of the apical myocardium; consistent with   infarction in the distribution of the left anterior  descending   coronary artery. Hypokinesis of the inferior myocardium. Acoustic   contrast opacification revealed no evidence ofthrombus. - Left atrium: The atrium was mildly dilated.  ECG: 06/27 SR, a pacing part or all of the time. LBBB is old, PVCs  ASSESSMENT AND PLAN:  Principal Problem:   VF (ventricular fibrillation) ([place]) - see interrogation report - had VT as well, 2 episodes rx w/ ATP - Shocked x 3 - no prodrome except weakness and presyncope around noon w/ ICD shocks happening about 6 pm. - add amio 400 mg bid for now.  - K+ is 4.0, ck Mg - EP to see in am  Active Problems:   Chronic systolic CHF (congestive heart failure) (H[facility]) - volume status good - daily weights - hold HCTZ and Cozaar until need for dye studies eliminated    Automatic implantable cardioverter-defibrillator in situ - MDT device    Diabetes mellitus due to underlying condition with stage 3 chronic kidney disease ([place]) - SSI, hold home rx     Ventricular tachycardia (paroxysmal) ([place]) - see above    Coronary artery disease involving native coronary artery of native heart without angina pectoris - LAD PCI 1995, no cath since     Stage 3 chronic kidney disease - recheck BMET in am - hold HCTZ and Cozaar   Signed, Lenoard Aden 03/01/2017 10:19 PM Beeper [phone]  Patient seen with PA, agree with the above note.  Patient was in his usual state of health today except blood glucose was high, making him feel a little "funny."  No chest pain or dyspnea.  He felt his ICD discharge x 3.  He did not pass out.  He came to the ER.  Interrogation of MDT ICD showed 6 episodes of VT/VF today, 3 terminated by shock, others by ATP.  He has 28 episodes of VT since last device check but only today has required shocks.    Troponin not elevated.  He has chronic systolic CHF and does not appear to be decompensated.  He feels fine currently.  Most likely scar-mediated VT.   - cycle troponin, if  significant rise will need cath.   - I will arrange for echo. - At this point, I think that he is  going to need an anti-arrhythmic.  Options will be amiodarone versus sotalol.  Given advanced age, will start amiodarone 400 mg bid.  Can be adjusted if desired by EP.    Admit.   Loralie Champagne 03/01/2017 10:36 PM

## 2017-03-01 NOTE — ED Notes (Signed)
Cards at the bedside

## 2017-03-01 NOTE — ED Provider Notes (Signed)
[facility] DEPT Provider Note   CSN: [id number] Arrival date & time: 03/01/17  0102     History   Chief Complaint No chief complaint on file.   HPI Rodney Lee is a 76 y.o. male.  HPI Patient presents after his AICD fired 3 times. States he was sitting at dinner and then his AICD fired. A couple minutes later it fired again twice. Was not feeling lightheaded or dizzy. No chest pain before the event. No lightheadedness. States he has fired once previously when he had a different machine in there. States he was feeling kind of weak all day. Chest pain. History of A. fib and ventricular tachycardia.   Past Medical History:  Diagnosis Date  . Atrial fibrillation ([place])    CHADS2Vasc is at least 5, on Xarelto  . CAD (coronary artery disease)   . cardiomyopathy    MDT ICD, Dr. Lovena Le 2009  . CHF (congestive heart failure) ([place])   . Diabetes mellitus   . Exogenous obesity   . Hyperlipidemia   . Hypertension   . Hypothyroidism   . MI, old [place], PTCA LAD per pt  . Sinus bradycardia     Patient Active Problem List   Diagnosis Date Noted  . VF (ventricular fibrillation) ([place]) 03/01/2017  . Trigger finger, left middle finger 02/27/2017  . Coronary artery disease involving native coronary artery of native heart without angina pectoris 10/16/2016  . LBBB (left bundle branch block) 10/16/2016  . Stage 3 chronic kidney disease 10/16/2016  . Ventricular tachycardia (paroxysmal) ([place]) 01/21/2015  . Diabetes mellitus due to underlying condition with stage 3 chronic kidney disease ([place]) 02/13/2014  . Atrial fibrillation ([place]) 10/08/2013  . Chronic ischemic heart disease, unspecified 06/20/2011  . Dyslipidemia 06/20/2011  . CARDIOMYOPATHY, ISCHEMIC 05/16/2009  . SINUS BRADYCARDIA 05/16/2009  . Chronic systolic CHF (congestive heart failure) ([place]) 05/16/2009  . Automatic implantable cardioverter-defibrillator in situ 05/16/2009    Past Surgical History:    Procedure Laterality Date  . ANGIOPLASTY    . CARDIAC CATHETERIZATION  03/13/1994   ACUTE MI WITH TOTAL OCCLUSION OF MID LAD. REPERFUSION AFTER CROSSING WITH A GUIDEWIRE. POOR RIGHT TO LEFT COLLATERAL FLOW. Pt states had PTCA  . CARDIAC DEFIBRILLATOR PLACEMENT    . CARDIOVASCULAR STRESS TEST  03/11/2008   EF 30%. NO REVERSIBLE ISCHEMIA  . EP IMPLANTABLE DEVICE N/A 09/28/2015   Procedure:  ICD Generator Changeout;  Surgeon: Evans Lance, MD;  Location: [facility];  Service: Cardiovascular;  Laterality: N/A;  . US ECHOCARDIOGRAPHY  04/26/2010   EF 35-40%. MODERATE LVH WITH MODERATE GLOBAL LV SYSTOLIC DYSFUNCTION AND IMPAIRED RELAXATION. MILD AORTIC SCLEROSIS WITHOUT STENOSIS. NORMAL PULMONARY ARTERY PRESSURE.  Marland Kitchen US ECHOCARDIOGRAPHY  10/07/2004   EF 30-35%       Home Medications    Prior to Admission medications   Medication Sig Start Date End Date Taking? Authorizing Provider  atorvastatin (LIPITOR) 80 MG tablet Take 80 mg by mouth at bedtime. 01/25/17  Yes [provider]  Canagliflozin (INVOKANA PO) Take by mouth.   Yes [provider]  carvedilol (COREG) 6.25 MG tablet TAKE 1 TABLET BY MOUTH TWO  TIMES DAILY WITH MEALS 12/12/16  Yes Evans Lance, MD  finasteride (PROSCAR) 5 MG tablet Take 5 mg by mouth daily. 05/30/15  Yes [provider]  glimepiride (AMARYL) 4 MG tablet Take 4 mg by mouth daily before breakfast. 05/30/15  Yes [provider]  hydrochlorothiazide (MICROZIDE) 12.5 MG capsule TAKE  1 CAPSULE BY MOUTH EVERY NIGHT Patient taking differently: Take 12.5 mg by mouth daily. TAKE 1 CAPSULE BY MOUTH EVERY NIGHT 10/29/15  Yes Darlin Coco, MD  levothyroxine (SYNTHROID, LEVOTHROID) 125 MCG tablet Take 125 mcg by mouth daily. 07/22/16  Yes [provider]  losartan (COZAAR) 25 MG tablet TAKE 1 TABLET BY MOUTH  DAILY 12/12/16  Yes Evans Lance, MD  metFORMIN (GLUCOPHAGE) 500 MG tablet Take 500 mg by mouth 2 (two) times daily  with a meal. Take one tablet in the morning and one tablet at night.   Yes [provider]  Multiple Vitamin (MULTIVITAMIN) tablet Take 1 tablet by mouth daily.     Yes [provider]  oxybutynin (DITROPAN-XL) 10 MG 24 hr tablet Take 10 mg by mouth daily. 04/18/15  Yes [provider]  rivaroxaban (XARELTO) 20 MG TABS tablet Take 1 tablet (20 mg total) by mouth daily with supper. 02/28/17  Yes Evans Lance, MD  Tamsulosin HCl (FLOMAX) 0.4 MG CAPS Take 0.4 mg by mouth daily.     Yes [provider]  vitamin C (ASCORBIC ACID) 500 MG tablet Take 500 mg by mouth daily.     Yes [provider]  nitroGLYCERIN (NITROSTAT) 0.4 MG SL tablet Place 1 tablet (0.4 mg total) under the tongue every 5 (five) minutes as needed for chest pain. 12/24/12   Darlin Coco, MD    Family History Family History  Problem Relation Age of Onset  . Heart disease Mother   . Heart attack Mother     Social History Social History  Substance Use Topics  . Smoking status: Never Smoker  . Smokeless tobacco: Never Used  . Alcohol use No     Allergies   Lipitor [atorvastatin calcium]   Review of Systems Review of Systems  Constitutional: Positive for fatigue. Negative for appetite change.  HENT: Negative for congestion.   Respiratory: Negative for shortness of breath.   Cardiovascular: Negative for chest pain, palpitations and leg swelling.  Gastrointestinal: Negative for abdominal pain.  Endocrine: Negative for polyuria.  Genitourinary: Negative for flank pain.  Musculoskeletal: Negative for back pain.  Skin: Negative for color change and wound.  Neurological: Negative for syncope.  Psychiatric/Behavioral: Negative for confusion.     Physical Exam Updated Vital Signs BP 122/77   Pulse (!) 59   Temp 98.7 F (37.1 C) (Oral)   Resp 15   SpO2 97%   Physical Exam  Constitutional: He appears well-developed.  HENT:  Head: Atraumatic.  Neck: Neck supple.   Cardiovascular: Normal rate.   Pulmonary/Chest: Effort normal. He has no wheezes.  AICD to left anterior upper chest wall.  Abdominal: Soft. There is no tenderness.  Musculoskeletal: Normal range of motion.  Neurological: He is alert.  Skin: Skin is warm. Capillary refill takes less than 2 seconds.     ED Treatments / Results  Labs (all labs ordered are listed, but only abnormal results are displayed) Labs Reviewed  BASIC METABOLIC PANEL - Abnormal; Notable for the following:       Result Value   Glucose, Bld 238 (*)    BUN 29 (*)    Creatinine, Ser 1.76 (*)    GFR calc non Af Amer 36 (*)    GFR calc Af Amer 41 (*)    All other components within normal limits  CBC - Abnormal; Notable for the following:    MCV 108.7 (*)    MCH 36.5 (*)    Platelets  122 (*)    All other components within normal limits  PROTIME-INR  MAGNESIUM  I-STAT TROPOININ, ED    EKG  EKG Interpretation  Date/Time:  Wednesday March 01 2017 19:08:05 EDT Ventricular Rate:  69 PR Interval:  186 QRS Duration: 128 QT Interval:  382 QTC Calculation: 409 R Axis:   -53 Text Interpretation:  Sinus rhythm with occasional Premature ventricular complexes Left axis deviation Non-specific intra-ventricular conduction block Cannot rule out Anteroseptal infarct , age undetermined T wave abnormality, consider inferolateral ischemia Abnormal ECG Confirmed by Alvino Chapel  MD, Alison Breeding [phone]) on 03/01/2017 7:12:33 PM       Radiology Dg Chest Portable 1 View  Result Date: 03/01/2017 CLINICAL DATA:  76 year old male status post defibrillator activation EXAM: PORTABLE CHEST 1 VIEW COMPARISON:  Prior chest x-ray 04/30/2016 FINDINGS: Left subclavian approach cardiac rhythm maintenance device. Leads project over the atrium and right ventricle. Stable cardiomegaly. Atherosclerotic calcifications present in the transverse aorta. No evidence of pulmonary edema, pleural effusion or pneumothorax. No new airspace opacity. No acute  osseous abnormality. IMPRESSION: Stable chest x-ray without evidence of acute cardiopulmonary process. Stable position of left subclavian approach cardiac rhythm maintenance device. Stable cardiomegaly. Electronically Signed   By: Jacqulynn Cadet M.D.   On: 03/01/2017 19:41    Procedures Procedures (including critical care time)  Medications Ordered in ED Medications  atorvastatin (LIPITOR) tablet 80 mg (not administered)  carvedilol (COREG) tablet 6.25 mg (not administered)  finasteride (PROSCAR) tablet 5 mg (not administered)  levothyroxine (SYNTHROID, LEVOTHROID) tablet 125 mcg (not administered)  multivitamin tablet 1 tablet (not administered)  oxybutynin (DITROPAN-XL) 24 hr tablet 10 mg (not administered)  rivaroxaban (XARELTO) tablet 20 mg (not administered)  tamsulosin (FLOMAX) capsule 0.4 mg (not administered)  vitamin C (ASCORBIC ACID) tablet 500 mg (not administered)     Initial Impression / Assessment and Plan / ED Course  I have reviewed the triage vital signs and the nursing notes.  Pertinent labs & imaging results that were available during my care of the patient were reviewed by me and considered in my medical decision making (see chart for details).     Patient presents after his AICD fired 3 times. He actually had 2 episodes of V. tach that were resolved with pacing then had 3 episodes that required shocks and went into the V. fib range. Has had 28 episodes of V. tach since last checked. These wounds today were the only ones that required intervention.  Seen by cardiology. They will start amiodarone and admit.  Final Clinical Impressions(s) / ED Diagnoses   Final diagnoses:  Ventricular tachycardia [facility])    New Prescriptions New Prescriptions   No medications on file     Davonna Belling, MD 03/01/17 2307

## 2017-03-02 DIAGNOSIS — I480 Paroxysmal atrial fibrillation: Secondary | ICD-10-CM | POA: Diagnosis present

## 2017-03-02 DIAGNOSIS — I255 Ischemic cardiomyopathy: Secondary | ICD-10-CM | POA: Diagnosis present

## 2017-03-02 DIAGNOSIS — I5022 Chronic systolic (congestive) heart failure: Secondary | ICD-10-CM | POA: Diagnosis present

## 2017-03-02 DIAGNOSIS — I472 Ventricular tachycardia: Secondary | ICD-10-CM | POA: Diagnosis present

## 2017-03-02 DIAGNOSIS — E6609 Other obesity due to excess calories: Secondary | ICD-10-CM | POA: Diagnosis present

## 2017-03-02 DIAGNOSIS — I4901 Ventricular fibrillation: Secondary | ICD-10-CM | POA: Diagnosis present

## 2017-03-02 DIAGNOSIS — I2510 Atherosclerotic heart disease of native coronary artery without angina pectoris: Secondary | ICD-10-CM | POA: Diagnosis present

## 2017-03-02 DIAGNOSIS — N183 Chronic kidney disease, stage 3 (moderate): Secondary | ICD-10-CM | POA: Diagnosis present

## 2017-03-02 DIAGNOSIS — Z79890 Hormone replacement therapy (postmenopausal): Secondary | ICD-10-CM | POA: Diagnosis not present

## 2017-03-02 DIAGNOSIS — E1122 Type 2 diabetes mellitus with diabetic chronic kidney disease: Secondary | ICD-10-CM | POA: Diagnosis present

## 2017-03-02 DIAGNOSIS — Z6826 Body mass index (BMI) 26.0-26.9, adult: Secondary | ICD-10-CM | POA: Diagnosis not present

## 2017-03-02 DIAGNOSIS — E785 Hyperlipidemia, unspecified: Secondary | ICD-10-CM | POA: Diagnosis present

## 2017-03-02 DIAGNOSIS — Z7984 Long term (current) use of oral hypoglycemic drugs: Secondary | ICD-10-CM | POA: Diagnosis not present

## 2017-03-02 DIAGNOSIS — Z79899 Other long term (current) drug therapy: Secondary | ICD-10-CM | POA: Diagnosis not present

## 2017-03-02 DIAGNOSIS — I447 Left bundle-branch block, unspecified: Secondary | ICD-10-CM | POA: Diagnosis present

## 2017-03-02 DIAGNOSIS — I130 Hypertensive heart and chronic kidney disease with heart failure and stage 1 through stage 4 chronic kidney disease, or unspecified chronic kidney disease: Secondary | ICD-10-CM | POA: Diagnosis present

## 2017-03-02 LAB — GLUCOSE, CAPILLARY
GLUCOSE-CAPILLARY: 241 mg/dL — AB (ref 65–99)
Glucose-Capillary: 232 mg/dL — ABNORMAL HIGH (ref 65–99)
Glucose-Capillary: 177 mg/dL — ABNORMAL HIGH (ref 65–99)

## 2017-03-02 LAB — TSH: TSH: 3.127 u[IU]/mL (ref 0.350–4.500)

## 2017-03-02 LAB — MRSA PCR SCREENING: MRSA by PCR: NEGATIVE

## 2017-03-02 LAB — MAGNESIUM: Magnesium: 2.1 mg/dL (ref 1.7–2.4)

## 2017-03-02 MED ORDER — AMIODARONE HCL IN DEXTROSE 360-4.14 MG/200ML-% IV SOLN
60.0000 mg/h | INTRAVENOUS | Status: AC
  Administered 2017-03-02 (×2): 60 mg/h via INTRAVENOUS
  Filled 2017-03-02 (×2): qty 200

## 2017-03-02 MED ORDER — NITROGLYCERIN 0.4 MG SL SUBL: 0.4000 mg | SUBLINGUAL_TABLET | SUBLINGUAL | Status: DC | PRN

## 2017-03-02 MED ORDER — AMIODARONE LOAD VIA INFUSION
150.0000 mg | Freq: Once | INTRAVENOUS | Status: AC
  Administered 2017-03-02: 150 mg via INTRAVENOUS
  Filled 2017-03-02: qty 83.34

## 2017-03-02 MED ORDER — INSULIN ASPART 100 UNIT/ML ~~LOC~~ SOLN: 0.0000 [IU] | Freq: Three times a day (TID) | SUBCUTANEOUS | Status: DC

## 2017-03-02 MED ORDER — ONDANSETRON HCL 4 MG/2ML IJ SOLN: 4.0000 mg | Freq: Four times a day (QID) | INTRAMUSCULAR | Status: DC | PRN

## 2017-03-02 MED ORDER — ZOLPIDEM TARTRATE 5 MG PO TABS: 5.0000 mg | ORAL_TABLET | Freq: Every evening | ORAL | Status: DC | PRN

## 2017-03-02 MED ORDER — RIVAROXABAN 15 MG PO TABS
15.0000 mg | ORAL_TABLET | Freq: Every day | ORAL | Status: DC
  Administered 2017-03-02 – 2017-03-03 (×2): 15 mg via ORAL
  Filled 2017-03-02 (×2): qty 1

## 2017-03-02 MED ORDER — ACETAMINOPHEN 325 MG PO TABS: 650.0000 mg | ORAL_TABLET | ORAL | Status: DC | PRN

## 2017-03-02 MED ORDER — INSULIN GLARGINE 100 UNIT/ML ~~LOC~~ SOLN
5.0000 [IU] | Freq: Every day | SUBCUTANEOUS | Status: DC
  Administered 2017-03-02 – 2017-03-03 (×2): 5 [IU] via SUBCUTANEOUS
  Filled 2017-03-02 (×2): qty 0.05

## 2017-03-02 MED ORDER — AMIODARONE HCL IN DEXTROSE 360-4.14 MG/200ML-% IV SOLN
30.0000 mg/h | INTRAVENOUS | Status: DC
  Administered 2017-03-02: 30 mg/h via INTRAVENOUS
  Filled 2017-03-02: qty 200

## 2017-03-02 MED ORDER — ALPRAZOLAM 0.25 MG PO TABS: 0.2500 mg | ORAL_TABLET | Freq: Two times a day (BID) | ORAL | Status: DC | PRN

## 2017-03-02 MED ORDER — INSULIN ASPART 100 UNIT/ML ~~LOC~~ SOLN
0.0000 [IU] | Freq: Three times a day (TID) | SUBCUTANEOUS | Status: DC
  Administered 2017-03-02 (×2): 5 [IU] via SUBCUTANEOUS
  Administered 2017-03-03: 3 [IU] via SUBCUTANEOUS
  Administered 2017-03-03: 5 [IU] via SUBCUTANEOUS
  Administered 2017-03-03 – 2017-03-04 (×2): 3 [IU] via SUBCUTANEOUS

## 2017-03-02 MED ORDER — INSULIN ASPART 100 UNIT/ML ~~LOC~~ SOLN: 0.0000 [IU] | Freq: Every day | SUBCUTANEOUS | Status: DC

## 2017-03-02 NOTE — Discharge Instructions (Addendum)
Information on my medicine - XARELTO (Rivaroxaban)  Why was Xarelto prescribed for you? Xarelto was prescribed for you to reduce the risk of a blood clot forming that can cause a stroke if you have a medical condition called atrial fibrillation (a type of irregular heartbeat).  What do you need to know about xarelto ? Take your Xarelto ONCE DAILY at the same time every day with your evening meal. If you have difficulty swallowing the tablet whole, you may crush it and mix in applesauce just prior to taking your dose.  Take Xarelto exactly as prescribed by your doctor and DO NOT stop taking Xarelto without talking to the doctor who prescribed the medication.  Stopping without other stroke prevention medication to take the place of Xarelto may increase your risk of developing a clot that causes a stroke.  Refill your prescription before you run out.  After discharge, you should have regular check-up appointments with your healthcare provider that is prescribing your Xarelto.  In the future your dose may need to be changed if your kidney function or weight changes by a significant amount.  What do you do if you miss a dose? If you are taking Xarelto ONCE DAILY and you miss a dose, take it as soon as you remember on the same day then continue your regularly scheduled once daily regimen the next day. Do not take two doses of Xarelto at the same time or on the same day.   Important Safety Information A possible side effect of Xarelto is bleeding. You should call your healthcare provider right away if you experience any of the following: ? Bleeding from an injury or your nose that does not stop. ? Unusual colored urine (red or dark brown) or unusual colored stools (red or black). ? Unusual bruising for unknown reasons. ? A serious fall or if you hit your head (even if there is no bleeding).  Some medicines may interact with Xarelto and might increase your risk of bleeding while on  Xarelto. To help avoid this, consult your healthcare provider or pharmacist prior to using any new prescription or non-prescription medications, including herbals, vitamins, non-steroidal anti-inflammatory drugs (NSAIDs) and supplements.  This website has more information on Xarelto: [email or web address]  Low salt diabetic diet.   Amiodarone at 400 my twice a day for 2 weeks then to 400 mg daily beginning 03/19/17      Your Xarelto has been decreased to 15 mg daily due to your kidney function.  New script sent.   Office will schedule you pulmonary function studies (PFTs).  To check your lungs with amiodarone.

## 2017-03-02 NOTE — Consult Note (Signed)
Cardiology Consultation:   Patient ID: Rodney Lee; [id number]; 1941-05-10   Admit date: 03/01/2017 Date of Consult: 03/02/2017  Primary Care Provider: Gaynelle Arabian, MD Primary Cardiologist: Dr. Sallyanne Kuster Primary Electrophysiologist:  Dr. Lovena Le   Patient Profile:   Rodney Lee is a 76 y.o. male with a hx of ICM w/ICD, PAFib, chronic CHF (systolic), CAD with PCI to LAD in 1995, DM, HTN, HLD who is being seen today for the evaluation of VT/ICD shocks at the request of Aundra Dubin.  History of Present Illness:   Mr. Schultes was admitted yesterday after having been shocked by his ICD>  He had been feeling weak earilier in the day attributed to his elevated BS reported to have been >300, he took his DM medicine and this improved and he also felt better.  Later while he was getting ready to eat dinner his ICD went off, and quickly 2 more times.  He reported feeling well before and after, though was instructed if ever shocked repetitively he needed to come in.  In review of the record (ICD interrogation is not readily available for review), Dr. Aundra Dubin noted: Interrogation of MDT ICD showed 6 episodes of VT/VF today, 3 terminated by shock, others by ATP.  He has 28 episodes of VT since last device check but only today has required shocks.    The patient this morning is feeling well, no complaints, has not had or been having any CP, palpitations or SOB, no near syncope or syncope.  LABS: K+ 4.0 Mag 2.1 BUN/Creat 29/1.76 poc Trop 0.07 WBC 7.7 H/H 16/48 Plts 122 TSH 3.127  Past Medical History:  Diagnosis Date  . Atrial fibrillation ([place])    CHADS2Vasc is at least 5, on Xarelto  . CAD (coronary artery disease)   . cardiomyopathy    MDT ICD, Dr. Lovena Le 2009  . CHF (congestive heart failure) ([place])   . Diabetes mellitus   . Exogenous obesity   . Hyperlipidemia   . Hypertension   . Hypothyroidism   . MI, old [place], PTCA LAD per pt  . Sinus bradycardia     Past  Surgical History:  Procedure Laterality Date  . ANGIOPLASTY    . CARDIAC CATHETERIZATION  03/13/1994   ACUTE MI WITH TOTAL OCCLUSION OF MID LAD. REPERFUSION AFTER CROSSING WITH A GUIDEWIRE. POOR RIGHT TO LEFT COLLATERAL FLOW. Pt states had PTCA  . CARDIAC DEFIBRILLATOR PLACEMENT    . CARDIOVASCULAR STRESS TEST  03/11/2008   EF 30%. NO REVERSIBLE ISCHEMIA  . EP IMPLANTABLE DEVICE N/A 09/28/2015   Procedure:  ICD Generator Changeout;  Surgeon: Evans Lance, MD;  Location: [facility];  Service: Cardiovascular;  Laterality: N/A;  . US ECHOCARDIOGRAPHY  04/26/2010   EF 35-40%. MODERATE LVH WITH MODERATE GLOBAL LV SYSTOLIC DYSFUNCTION AND IMPAIRED RELAXATION. MILD AORTIC SCLEROSIS WITHOUT STENOSIS. NORMAL PULMONARY ARTERY PRESSURE.  Marland Kitchen US ECHOCARDIOGRAPHY  10/07/2004   EF 30-35%     Inpatient Medications: Scheduled Meds: . amiodarone  150 mg Intravenous Once  . atorvastatin  80 mg Oral QHS  . carvedilol  6.25 mg Oral BID WC  . finasteride  5 mg Oral Daily  . insulin aspart  0-15 Units Subcutaneous TID WC  . insulin aspart  0-5 Units Subcutaneous QHS  . insulin glargine  5 Units Subcutaneous QHS  . levothyroxine  125 mcg Oral QAC breakfast  . multivitamin with minerals  1 tablet Oral Daily  . oxybutynin  10 mg Oral Daily  .  rivaroxaban  15 mg Oral Q supper  . tamsulosin  0.4 mg Oral Daily  . vitamin C  500 mg Oral Daily   Continuous Infusions: . amiodarone 60 mg/hr (03/02/17 1145)   Followed by  . amiodarone     PRN Meds: acetaminophen, ALPRAZolam, nitroGLYCERIN, ondansetron (ZOFRAN) IV, zolpidem  Allergies:    Allergies  Allergen Reactions  . Lipitor [Atorvastatin Calcium]     Memory problems    Social History:   Social History   Social History  . Marital status: Married    Spouse name: N/A  . Number of children: N/A  . Years of education: N/A   Occupational History  . Retired    Social History Main Topics  . Smoking status: Never Smoker  . Smokeless tobacco:  Never Used  . Alcohol use No  . Drug use: No  . Sexual activity: Not on file   Other Topics Concern  . Not on file   Social History Narrative   Lives with wife    Family History:   The patient's family history includes Heart attack in his mother; Heart disease in his mother.  ROS:  Please see the history of present illness.  ROS  All other ROS reviewed and negative.     Physical Exam/Data:   Vitals:   03/02/17 0815 03/02/17 0830 03/02/17 0845 03/02/17 0931  BP:  120/66  133/67  Pulse: (!) 59 60 (!) 59 64  Resp:    18  Temp:    97.6 F (36.4 C)  TempSrc:    Oral  SpO2: 95% 96% 96% 98%  Weight:    194 lb 14.4 oz (88.4 kg)  Height:    5\' 11"  (1.803 m)   No intake or output data in the 24 hours ending 03/02/17 1148 Filed Weights   03/02/17 0931  Weight: 194 lb 14.4 oz (88.4 kg)   Body mass index is 27.18 kg/m.  General:  Well nourished, well developed, in no acute distress HEENT: normal Lymph: no adenopathy Neck: no JVD Endocrine:  No thryomegaly Vascular: No carotid bruits Cardiac:  normal S1, S2; RRR; no murmur  Lungs:  CTA b/l, no wheezing, rhonchi or rales  Abd: soft, nontender, no hepatomegaly  Ext: no edema Musculoskeletal:  No deformities, BUE and BLE strength normal and equal Skin: warm and dry  Neuro:  No gross focal abnormalities noted Psych:  Normal affect   EKG:  The EKG was personally reviewed and demonstrates:  SR and A paced rhythm, PVCs, LAD Telemetry:  Telemetry was personally reviewed and demonstrates:  SR, occ PVCs  Relevant CV Studies:  Echo: 09/01/2015 - Left ventricle: The cavity size was mildly dilated. Systolic function was severely reduced. The estimated ejection fraction was in the range of 25% to 30%. Akinesis and scarring of the mid-apicalanteroseptal, anterior, and inferoseptal myocardium. Dyskinesis and scarring of the apical myocardium; consistent with infarction in the distribution of the left anterior  descending coronary artery. Hypokinesis of the inferior myocardium. Acoustic contrast opacification revealed no evidence ofthrombus. - Left atrium: The atrium was mildly dilated.  Laboratory Data:  Chemistry Recent Labs Lab 03/01/17 1910  NA 140  K 4.0  CL 104  CO2 25  GLUCOSE 238*  BUN 29*  CREATININE 1.76*  CALCIUM 9.2  GFRNONAA 36*  GFRAA 41*  ANIONGAP 11    Hematology Recent Labs Lab 03/01/17 1910  WBC 7.7  RBC 4.46  HGB 16.3  HCT 48.5  MCV 108.7*  MCH 36.5*  MCHC  33.6  RDW 15.3  PLT 122*    Recent Labs Lab 03/01/17 1940  TROPIPOC 0.07     Radiology/Studies:  Dg Chest Portable 1 View Result Date: 03/01/2017 CLINICAL DATA:  76 year old male status post defibrillator activation EXAM: PORTABLE CHEST 1 VIEW COMPARISON:  Prior chest x-ray 04/30/2016 FINDINGS: Left subclavian approach cardiac rhythm maintenance device. Leads project over the atrium and right ventricle. Stable cardiomegaly. Atherosclerotic calcifications present in the transverse aorta. No evidence of pulmonary edema, pleural effusion or pneumothorax. No new airspace opacity. No acute osseous abnormality. IMPRESSION: Stable chest x-ray without evidence of acute cardiopulmonary process. Stable position of left subclavian approach cardiac rhythm maintenance device. Stable cardiomegaly. Electronically Signed   By: Jacqulynn Cadet M.D.   On: 03/01/2017 19:41    Assessment and Plan:   The patient is seen/examined by Dr. Curt Bears  1. ICD shocks, reported 2/2 VT/VF     Makenzie Weisner get ICD interrogation for review     Start IV amiodarone  2. CAD     No anginal symptoms     No plans for ischemic w/u at this time  3. Chronic CHF/ICM     Exam appears stable/euvolemic     Updated echo ordered     Signed, Baldwin Jamaica, PA-C  03/02/2017 11:48 AM  I have seen and examined this patient with Tommye Standard.  Agree with above, note added to reflect my findings.  On exam, RRR, no murmurs, lungs clear.  Presented to the hospital with ICD shocks. Says had been feeling well but had 3 shocks yesterday. No chest pain or SOB. Has had no fatigue or weakness. Device interrogation (per notes) says had 6 episodes of VT/VF with 3 shocks and 3 terminated with ATP. No ischemic symptoms and troponin not indicative of MI. Plan to update echo. Darriel Sinquefield start amiodarone IV with transition to PO tomorrow.    Pricella Gaugh M. Josten Warmuth MD 03/02/2017 12:21 PM

## 2017-03-03 ENCOUNTER — Inpatient Hospital Stay (HOSPITAL_COMMUNITY): Payer: Medicare Other

## 2017-03-03 DIAGNOSIS — I255 Ischemic cardiomyopathy: Secondary | ICD-10-CM

## 2017-03-03 DIAGNOSIS — I472 Ventricular tachycardia: Secondary | ICD-10-CM

## 2017-03-03 LAB — GLUCOSE, CAPILLARY
GLUCOSE-CAPILLARY: 162 mg/dL — AB (ref 65–99)
GLUCOSE-CAPILLARY: 162 mg/dL — AB (ref 65–99)
GLUCOSE-CAPILLARY: 213 mg/dL — AB (ref 65–99)
Glucose-Capillary: 135 mg/dL — ABNORMAL HIGH (ref 65–99)

## 2017-03-03 LAB — BASIC METABOLIC PANEL
ANION GAP: 7 (ref 5–15)
BUN: 24 mg/dL — ABNORMAL HIGH (ref 6–20)
CALCIUM: 8.8 mg/dL — AB (ref 8.9–10.3)
CO2: 27 mmol/L (ref 22–32)
Chloride: 104 mmol/L (ref 101–111)
Creatinine, Ser: 1.57 mg/dL — ABNORMAL HIGH (ref 0.61–1.24)
GFR calc non Af Amer: 41 mL/min — ABNORMAL LOW (ref >60)
GFR, EST AFRICAN AMERICAN: 47 mL/min — AB (ref >60)
Glucose, Bld: 155 mg/dL — ABNORMAL HIGH (ref 65–99)
Potassium: 3.9 mmol/L (ref 3.5–5.1)
SODIUM: 138 mmol/L (ref 135–145)

## 2017-03-03 MED ORDER — AMIODARONE HCL 200 MG PO TABS
400.0000 mg | ORAL_TABLET | Freq: Two times a day (BID) | ORAL | Status: DC
  Administered 2017-03-03 – 2017-03-04 (×3): 400 mg via ORAL
  Filled 2017-03-03 (×3): qty 2

## 2017-03-03 MED ORDER — PERFLUTREN LIPID MICROSPHERE
1.0000 mL | INTRAVENOUS | Status: AC | PRN
  Administered 2017-03-03: 1 mL via INTRAVENOUS
  Filled 2017-03-03: qty 10

## 2017-03-03 NOTE — Telephone Encounter (Signed)
**Note De-Identified Dasani Crear Obfuscation** The tier exception for Xarelto has been denied because it is a name brand drug and a tier exception cannot be obtained for brand name drugs through medicare. The pts wife is aware.  The pts wife is aware that I am mailing the pt a Xarelto pt assistance application to complete and return to this office to be faxed in with the provider portion of the application.

## 2017-03-03 NOTE — Progress Notes (Signed)
*  PRELIMINARY RESULTS* Echocardiogram 2D Echocardiogram with definity has been performed.  Leavy Cella 03/03/2017, 2:39 PM

## 2017-03-03 NOTE — Plan of Care (Signed)
Problem: Safety: Goal: Ability to remain free from injury will improve Outcome: Progressing No falls during this admission. Patient alert and oriented. Call bell within reach. Bed in low and locked position. Clean and clear environment maintained. 3/4 siderails in place. Nonskid footwear being utilized. Patient verbalized understanding of safety instruction.  Problem: Pain Managment: Goal: General experience of comfort will improve Outcome: Progressing Patient denies pain at this time. Vital signs are stable. Patient resting well. No facial grimacing or moaning evident.

## 2017-03-03 NOTE — Progress Notes (Signed)
Progress Note  Patient Name: Rodney Lee Date of Encounter: 03/03/2017  Primary Cardiologist: Dr. Lovena Le  Subjective    Feels well, no c/o CP, palpitations or SOB  Inpatient Medications    Scheduled Meds: . amiodarone  400 mg Oral BID  . atorvastatin  80 mg Oral QHS  . carvedilol  6.25 mg Oral BID WC  . finasteride  5 mg Oral Daily  . insulin aspart  0-15 Units Subcutaneous TID WC  . insulin aspart  0-5 Units Subcutaneous QHS  . insulin glargine  5 Units Subcutaneous QHS  . levothyroxine  125 mcg Oral QAC breakfast  . multivitamin with minerals  1 tablet Oral Daily  . oxybutynin  10 mg Oral Daily  . rivaroxaban  15 mg Oral Q supper  . tamsulosin  0.4 mg Oral Daily  . vitamin C  500 mg Oral Daily   Continuous Infusions:  PRN Meds: acetaminophen, ALPRAZolam, nitroGLYCERIN, ondansetron (ZOFRAN) IV, zolpidem   Vital Signs    Vitals:   03/02/17 1621 03/02/17 1947 03/03/17 0525 03/03/17 0819  BP: 115/67 128/67 130/65 122/76  Pulse: 60 60 60   Resp:      Temp: 98.2 F (36.8 C) 97.7 F (36.5 C) 97.7 F (36.5 C)   TempSrc: Oral Oral Oral   SpO2: 98% 97% 98%   Weight:   189 lb 8 oz (86 kg)   Height:        Intake/Output Summary (Last 24 hours) at 03/03/17 1118 Last data filed at 03/03/17 0647  Gross per 24 hour  Intake           442.75 ml  Output             1100 ml  Net          -657.25 ml   Filed Weights   03/02/17 0931 03/03/17 0525  Weight: 194 lb 14.4 oz (88.4 kg) 189 lb 8 oz (86 kg)    Telemetry    SR, occ PVCs - Personally Reviewed  ECG    No new EKGs  Physical Exam   GEN: No acute distress.   Neck: No JVD Cardiac: RRR, no murmurs, rubs, or gallops.  Respiratory: Clear to auscultation bilaterally. GI: Soft, nontender, non-distended, obese MS: No edema; No deformity. Neuro:  Nonfocal  Psych: Normal affect   Labs    Chemistry Recent Labs Lab 03/01/17 1910 03/03/17 0353  NA 140 138  K 4.0 3.9  CL 104 104  CO2 25 27  GLUCOSE  238* 155*  BUN 29* 24*  CREATININE 1.76* 1.57*  CALCIUM 9.2 8.8*  GFRNONAA 36* 41*  GFRAA 41* 47*  ANIONGAP 11 7     Hematology Recent Labs Lab 03/01/17 1910  WBC 7.7  RBC 4.46  HGB 16.3  HCT 48.5  MCV 108.7*  MCH 36.5*  MCHC 33.6  RDW 15.3  PLT 122*    Cardiac EnzymesNo results for input(s): TROPONINI in the last 168 hours.  Recent Labs Lab 03/01/17 1940  TROPIPOC 0.07      Radiology    No new results  Cardiac Studies   Echo: 09/01/2015 - Left ventricle: The cavity size was mildly dilated. Systolic function was severely reduced. The estimated ejection fraction was in the range of 25% to 30%. Akinesis and scarring of the mid-apicalanteroseptal, anterior, and inferoseptal myocardium. Dyskinesis and scarring of the apical myocardium; consistent with infarction in the distribution of the left anterior descending coronary artery. Hypokinesis of the inferior myocardium. Acoustic  contrast opacification revealed no evidence ofthrombus. - Left atrium: The atrium was mildly dilated.  Patient Profile     76 y.o. male hx of ICM w/ICD, PAFib, chronic CHF (systolic), CAD with PCI to LAD in 1995, DM, HTN, HLD who is being seen today for the evaluation of VT/ICD shocks, started on IV amiodarone  Assessment & Plan    1. ICD shocks, reported 2/2 VT/VF     Carelink report reviewed, + VT storm     telemetry has been quiet, will transition to PO amiodarone today     400mg  BID for a week, then 200mg  BID until follow up  2. CAD     No anginal symptoms     No plans for ischemic w/u at this time  3. Chronic CHF/ICM     Exam appears stable/euvolemic     Updated echo ordered, pending  4. Paroxysmal AFib     CHA2DS2Vasc is 6, on Xarelto (appropriately reduced dose for calc Creat Cl of 48)   Signed, Renee Dyane Dustman, PA-C  03/03/2017, 11:18 AM    EP Attending  Patient seen and examined. Agree with above. His has done well over night with no additional  VT. I have recommended he be transitioned to oral amiodarone and he can go home tomorrow if no additional sustained VT tonight. No clinical evidence of ischemia. I will not workup new ischemia as an inpatient. Amiodarone 400 bid for a week then 200 bid for a month.  Mikle Bosworth.D.

## 2017-03-03 NOTE — Telephone Encounter (Signed)
The PA on Xarelto has been denied as Xarelto is covered by the pts insurance.  I have done a tier exception on Xarelto through covermymeds as even though his insurance covers Xarelto it is still too expensive for the pt to afford. Awaiting reply.

## 2017-03-04 ENCOUNTER — Other Ambulatory Visit: Payer: Self-pay | Admitting: Cardiology

## 2017-03-04 ENCOUNTER — Telehealth: Payer: Self-pay | Admitting: Nurse Practitioner

## 2017-03-04 DIAGNOSIS — T50905A Adverse effect of unspecified drugs, medicaments and biological substances, initial encounter: Secondary | ICD-10-CM

## 2017-03-04 LAB — GLUCOSE, CAPILLARY: GLUCOSE-CAPILLARY: 159 mg/dL — AB (ref 65–99)

## 2017-03-04 LAB — ECHOCARDIOGRAM COMPLETE
HEIGHTINCHES: 71 in
Weight: 3032 oz

## 2017-03-04 MED ORDER — AMIODARONE HCL 400 MG PO TABS: 400.0000 mg | ORAL_TABLET | Freq: Two times a day (BID) | ORAL | 6 refills | Status: DC

## 2017-03-04 MED ORDER — RIVAROXABAN 15 MG PO TABS: 15.0000 mg | ORAL_TABLET | Freq: Every day | ORAL | 6 refills | Status: DC

## 2017-03-04 NOTE — Telephone Encounter (Signed)
   Pts wife called.  Pt d/c'd earlier today after hospitalization for VF arrest.  He was d/c'd on amio 400 bid.  At their local pharmacy, this will cost > $300.  They normally use [facility] and she hasn't called them yet to see what it will cost.  I rec that maybe she can have 10-20 days of the Rx filled in order to keep cost down and then we can send Rx to [facility] if more financially feasible.  Pt is also on xarelto.  This dose was reduced to 15 mg daily 2/2 gfr of 40. He had been on 20 mg.  They will call office on Monday to see if samples of 15 mg is available.  She plans to give him a 20 mg tab tonight and tomorrow night only.  Caller verbalized understanding and was grateful for the call back.  Murray Hodgkins, NP 03/04/2017, 3:43 PM

## 2017-03-04 NOTE — Discharge Summary (Signed)
Discharge Summary    Patient ID: Rodney Lee,  MRN: [id number], DOB/AGE: 1941-06-20 76 y.o.  Admit date: 03/01/2017 Discharge date: 03/04/2017  Primary Care Provider: Gaynelle Arabian Primary Cardiologist: Dr. Sallyanne Kuster  EP Dr. Lovena Le   Discharge Diagnoses    Principal Problem:   VF (ventricular fibrillation) [facility]) Active Problems:   Ventricular tachycardia (paroxysmal) ([facility])   Ischemic cardiomyopathy   Chronic systolic CHF (congestive heart failure) ([place])   Automatic implantable cardioverter-defibrillator in situ   Diabetes mellitus due to underlying condition with stage 3 chronic kidney disease ([place])   Coronary artery disease involving native coronary artery of native heart without angina pectoris   LBBB (left bundle branch block)   Stage 3 chronic kidney disease   Allergies Allergies  Allergen Reactions  . Lipitor [Atorvastatin Calcium]     Memory problems    Diagnostic Studies/Procedures    Echo 03/03/17 results Pending _____________   History of Present Illness     76 yo M with hx of PAF on Xarelto for ChaD2S2VASc of 6, S-CHF, DM, HTN, ICM with EF 25-30% by echo 08/2015, MI 1995.   On 03/01/17 pt was in usual state of health and ran errands.  He began feeling weak which increased with standing.  His glucose > 300 and took extra Invokana to bring glucose down which worked.  He felt better and went to dinner.  He felt well and ICD fired.  He did not feel any different.  He turned and it fired again and then a 3rd time.  No symptoms.  He presented to ER because he was concerned it would keep firing.  He had no chest pain, no SOB no palpitations.   EKG SR with pacing part or all the time.  LBBB is old, PVCs.  ICD interrogated and found to have V fib. And V. Tach.  Total shock was X 3.  Pt was admitted.   Hospital Course     Consultants: EP Dr. Curt Bears  Pt was started on amiodarone 400 mg BID, K+ was 4.0 Mg+ was 2.1. .  His chronic systolic HF was stable, HCTZ and  Cozaar held initially. diabetes managed with SSI.  EP saw in consult and pt placed on IV amiodarone.  After 24 hours pt transitioned to po amiodarone 400 mg BID and with neg troponin Dr. Lovena Le did not wish to work up ischemia as inpt.  By 03/04/17 Echo has been done results not yet back.  Pt seen and evaluated by Dr. Caryl Comes and found stable for discharge.  Will schedule outpt PFTs and reprogramming of ICD-VT detection rates.  He has follow up appt with EP-APP.  Continue amiodarone 400 mg BID for 2 weeks then 400 mg daily.   Xarelto was decreased to 15 mg daily due to Cr. Clearance.  Consider decreasing lipitor to 40 mg as amiodarone may increase lipitor concentrations. _____________  Discharge Vitals Blood pressure 137/80, pulse 65, temperature 97.6 F (36.4 C), temperature source Oral, resp. rate 16, height 5\' 11"  (1.803 m), weight 189 lb 11.2 oz (86 kg), SpO2 97 %.  Filed Weights   03/02/17 0931 03/03/17 0525 03/04/17 0424  Weight: 194 lb 14.4 oz (88.4 kg) 189 lb 8 oz (86 kg) 189 lb 11.2 oz (86 kg)    Labs & Radiologic Studies    CBC  Recent Labs  03/01/17 1910  WBC 7.7  HGB 16.3  HCT 48.5  MCV 108.7*  PLT 742*   Basic Metabolic Panel  Recent  Labs  03/01/17 1910 03/02/17 0946 03/03/17 0353  NA 140  --  138  K 4.0  --  3.9  CL 104  --  104  CO2 25  --  27  GLUCOSE 238*  --  155*  BUN 29*  --  24*  CREATININE 1.76*  --  1.57*  CALCIUM 9.2  --  8.8*  MG 2.1 2.1  --    Liver Function Tests No results for input(s): AST, ALT, ALKPHOS, BILITOT, PROT, ALBUMIN in the last 72 hours. No results for input(s): LIPASE, AMYLASE in the last 72 hours. Cardiac Enzymes Troponin 0.07 X 2   BNP Invalid input(s): POCBNP D-Dimer No results for input(s): DDIMER in the last 72 hours. Hemoglobin A1C No results for input(s): HGBA1C in the last 72 hours. Fasting Lipid Panel No results for input(s): CHOL, HDL, LDLCALC, TRIG, CHOLHDL, LDLDIRECT in the last 72 hours. Thyroid Function  Tests  Recent Labs  03/02/17 0946  TSH 3.127   _____________  Dg Chest Portable 1 View  Result Date: 03/01/2017 CLINICAL DATA:  76 year old male status post defibrillator activation EXAM: PORTABLE CHEST 1 VIEW COMPARISON:  Prior chest x-ray 04/30/2016 FINDINGS: Left subclavian approach cardiac rhythm maintenance device. Leads project over the atrium and right ventricle. Stable cardiomegaly. Atherosclerotic calcifications present in the transverse aorta. No evidence of pulmonary edema, pleural effusion or pneumothorax. No new airspace opacity. No acute osseous abnormality. IMPRESSION: Stable chest x-ray without evidence of acute cardiopulmonary process. Stable position of left subclavian approach cardiac rhythm maintenance device. Stable cardiomegaly. Electronically Signed   By: Jacqulynn Cadet M.D.   On: 03/01/2017 19:41   Disposition   Pt is being discharged home today in good condition.  Follow-up Plans & Appointments   Low salt diabetic diet.   Amiodarone at 400 my twice a day for 2 weeks then to 400 mg daily beginning 03/19/17      Your Xarelto has been decreased to 15 mg daily due to your kidney function.  New script sent.   Office will schedule you pulmonary function studies (PFTs).  To check your lungs with amiodarone.       Follow-up Information    Baldwin Jamaica, PA-C Follow up on 04/10/2017.   Specialty:  Cardiology Why:  11:30AM Contact information: [address] [address] [phone]            Discharge Medications   Current Discharge Medication List    START taking these medications   Details  amiodarone (PACERONE) 400 MG tablet Take 1 tablet (400 mg total) by mouth 2 (two) times daily. Beginning 03/19/17 decrease amiodarone to 400 mg daily. Qty: 60 tablet, Refills: 6      CONTINUE these medications which have CHANGED   Details  Rivaroxaban (XARELTO) 15 MG TABS tablet Take 1 tablet (15 mg total) by mouth daily with  supper. Qty: 30 tablet, Refills: 6      CONTINUE these medications which have NOT CHANGED   Details  atorvastatin (LIPITOR) 80 MG tablet Take 80 mg by mouth at bedtime.    Canagliflozin (INVOKANA PO) Take by mouth.    carvedilol (COREG) 6.25 MG tablet TAKE 1 TABLET BY MOUTH TWO  TIMES DAILY WITH MEALS Qty: 180 tablet, Refills: 0    finasteride (PROSCAR) 5 MG tablet Take 5 mg by mouth daily.    glimepiride (AMARYL) 4 MG tablet Take 4 mg by mouth daily before breakfast.    hydrochlorothiazide (MICROZIDE) 12.5 MG capsule TAKE  1 CAPSULE BY MOUTH EVERY NIGHT Qty: 90 capsule, Refills: 1    levothyroxine (SYNTHROID, LEVOTHROID) 125 MCG tablet Take 125 mcg by mouth daily.    losartan (COZAAR) 25 MG tablet TAKE 1 TABLET BY MOUTH  DAILY Qty: 90 tablet, Refills: 0    metFORMIN (GLUCOPHAGE) 500 MG tablet Take 500 mg by mouth 2 (two) times daily with a meal. Take one tablet in the morning and one tablet at night.    Multiple Vitamin (MULTIVITAMIN) tablet Take 1 tablet by mouth daily.      oxybutynin (DITROPAN-XL) 10 MG 24 hr tablet Take 10 mg by mouth daily.    Tamsulosin HCl (FLOMAX) 0.4 MG CAPS Take 0.4 mg by mouth daily.      vitamin C (ASCORBIC ACID) 500 MG tablet Take 500 mg by mouth daily.      nitroGLYCERIN (NITROSTAT) 0.4 MG SL tablet Place 1 tablet (0.4 mg total) under the tongue every 5 (five) minutes as needed for chest pain. Qty: 25 tablet, Refills: prn   Associated Diagnoses: Chest pain           Outstanding Labs/Studies   ECHO Pending   Needs outpt PFTs order in place.  Duration of Discharge Encounter   Greater than 30 minutes including physician time.  Signed, Cecilie Kicks NP 03/04/2017, 9:16 AM

## 2017-03-04 NOTE — Progress Notes (Signed)
Progress Note  Patient Name: Rodney Lee Date of Encounter: 03/04/2017  Primary Cardiologist: Dr. Lovena Le  Subjective  The patient denies chest pain, shortness of breath, nocturnal dyspnea, orthopnea or peripheral edema.  There have been no palpitations, lightheadedness or syncope.    Tolerating amio   Lots of questions  Inpatient Medications    Scheduled Meds: . amiodarone  400 mg Oral BID  . atorvastatin  80 mg Oral QHS  . carvedilol  6.25 mg Oral BID WC  . finasteride  5 mg Oral Daily  . insulin aspart  0-15 Units Subcutaneous TID WC  . insulin aspart  0-5 Units Subcutaneous QHS  . insulin glargine  5 Units Subcutaneous QHS  . levothyroxine  125 mcg Oral QAC breakfast  . multivitamin with minerals  1 tablet Oral Daily  . oxybutynin  10 mg Oral Daily  . rivaroxaban  15 mg Oral Q supper  . tamsulosin  0.4 mg Oral Daily  . vitamin C  500 mg Oral Daily   Continuous Infusions:  PRN Meds: acetaminophen, ALPRAZolam, nitroGLYCERIN, ondansetron (ZOFRAN) IV, zolpidem   Vital Signs    Vitals:   03/03/17 0819 03/03/17 1501 03/03/17 2213 03/04/17 0424  BP: 122/76 112/69 130/73 137/80  Pulse:  63 60 65  Resp:   18 16  Temp:  98.9 F (37.2 C) 98.1 F (36.7 C) 97.6 F (36.4 C)  TempSrc:  Oral Oral Oral  SpO2:   98% 97%  Weight:    189 lb 11.2 oz (86 kg)  Height:        Intake/Output Summary (Last 24 hours) at 03/04/17 7412 Last data filed at 03/04/17 0600  Gross per 24 hour  Intake              240 ml  Output             2925 ml  Net            -2685 ml   Filed Weights   03/02/17 0931 03/03/17 0525 03/04/17 0424  Weight: 194 lb 14.4 oz (88.4 kg) 189 lb 8 oz (86 kg) 189 lb 11.2 oz (86 kg)    Telemetry    No vt- Personally Reviewed  ECG      sinus with IVCD  Personally reviewed    Physical Exam   Well developed and nourished in no acute distress HENT normal Neck supple with JVP-flat Carotids brisk and full without bruits Clear Regular rate and  rhythm, no murmurs or gallops Abd-soft with active BS without hepatomegaly No Clubbing cyanosis edema Skin-warm and dry A & Oriented  Grossly normal sensory and motor function   Labs    Chemistry  Recent Labs Lab 03/01/17 1910 03/03/17 0353  NA 140 138  K 4.0 3.9  CL 104 104  CO2 25 27  GLUCOSE 238* 155*  BUN 29* 24*  CREATININE 1.76* 1.57*  CALCIUM 9.2 8.8*  GFRNONAA 36* 41*  GFRAA 41* 47*  ANIONGAP 11 7     Hematology  Recent Labs Lab 03/01/17 1910  WBC 7.7  RBC 4.46  HGB 16.3  HCT 48.5  MCV 108.7*  MCH 36.5*  MCHC 33.6  RDW 15.3  PLT 122*    Cardiac EnzymesNo results for input(s): TROPONINI in the last 168 hours.   Recent Labs Lab 03/01/17 1940  TROPIPOC 0.07      Radiology    No new results  Cardiac Studies   Echo: 09/01/2015 - Left ventricle: The cavity  size was mildly dilated. Systolic function was severely reduced. The estimated ejection fraction was in the range of 25% to 30%. Akinesis and scarring of the mid-apicalanteroseptal, anterior, and inferoseptal myocardium. Dyskinesis and scarring of the apical myocardium; consistent with infarction in the distribution of the left anterior descending coronary artery. Hypokinesis of the inferior myocardium. Acoustic contrast opacification revealed no evidence ofthrombus. - Left atrium: The atrium was mildly dilated.  Patient Profile     76 y.o. male hx of ICM w/ICD, PAFib, chronic CHF (systolic), CAD with PCI to LAD in 1995, DM, HTN, HLD who is being seen today for the evaluation of VT/ICD shocks, started on IV amiodarone  Assessment & Plan    1VT Storm  2. Ischemic Cardiomyopathy  Tn neg     No anginal symptoms     No plans for ischemic w/u at this time  3. Chronic CHF   4. Paroxysmal AFib     CHA2DS2Vasc is 6, on Xarelto (appropriately reduced dose for calc Creat Cl of 48)  clarifed questions Will need outpt PFTs and reprogramming of ICD-VT detection rates  EP-AP  followup 3 weeks or so plz  amio 400 bid x 2 weeks then 400 daily

## 2017-03-04 NOTE — Care Management Note (Signed)
Case Management Note  Patient Details  Name: Rodney Lee MRN: [id number] Date of Birth: 03/15/1941  Subjective/Objective:                 Patient to DC to home today. No CM needs identified at this time.    Action/Plan:  DC to home self care.  Expected Discharge Date:  03/04/17               Expected Discharge Plan:  Home/Self Care  In-House Referral:     Discharge planning Services  CM Consult  Post Acute Care Choice:    Choice offered to:     DME Arranged:    DME Agency:     HH Arranged:    HH Agency:     Status of Service:  Completed, signed off  If discussed at [employer] of Stay Meetings, dates discussed:    Additional Comments:  Carles Collet, RN 03/04/2017, 10:35 AM

## 2017-03-06 ENCOUNTER — Other Ambulatory Visit: Payer: Self-pay | Admitting: *Deleted

## 2017-03-06 ENCOUNTER — Telehealth: Payer: Self-pay | Admitting: Internal Medicine

## 2017-03-06 MED ORDER — RIVAROXABAN 15 MG PO TABS: 15.0000 mg | ORAL_TABLET | Freq: Every day | ORAL | 1 refills | Status: DC

## 2017-03-06 MED ORDER — AMIODARONE HCL 200 MG PO TABS: ORAL_TABLET | ORAL | 0 refills | Status: DC

## 2017-03-06 NOTE — Telephone Encounter (Signed)
Pt's medication was sent to pt's pharmacy as requested, per Lavella Lemons, LPN, Dr. Tanna Furry nurse. Confirmation received.

## 2017-03-06 NOTE — Telephone Encounter (Signed)
Request received for Xarelto 15mg ; pt is 76 yrs old, wt-88.5kg, Crea-1.57 on 03/03/17, CrCl-49.59ml/min, last seen by Dr. Luane School on 10/14/16. Will send in the request to [facility] as we have received a paper fax for med request.

## 2017-03-06 NOTE — Telephone Encounter (Signed)
New message      amiodarone (PACERONE) 400 MG tablet Take 1 tablet (400 mg total) by mouth 2 (two) times daily. Beginning 03/19/17 decrease amiodarone to 400 mg daily.    not covered by his insurance at 400mg , only covered at 200mg ,  Can you please adjust this medication so the insurance will cover it or give him something else

## 2017-03-06 NOTE — Telephone Encounter (Signed)
Okay to do 200 mg tablets

## 2017-03-07 ENCOUNTER — Telehealth: Payer: Self-pay | Admitting: Internal Medicine

## 2017-03-07 ENCOUNTER — Other Ambulatory Visit: Payer: Self-pay | Admitting: *Deleted

## 2017-03-07 MED ORDER — AMIODARONE HCL 200 MG PO TABS: ORAL_TABLET | ORAL | 0 refills | Status: DC

## 2017-03-07 NOTE — Telephone Encounter (Signed)
Physician portion completed and placed in yellow file for patient assistance XARELTO, awaiting patient portion to be completed.

## 2017-03-07 NOTE — Telephone Encounter (Signed)
Rodney Lee is wanting to speak w/you about his schedule . He is not sure what to do about his appt.on 03/28/17, insist on speaking with a nurse . Please call

## 2017-03-09 ENCOUNTER — Telehealth: Payer: Self-pay | Admitting: Internal Medicine

## 2017-03-09 ENCOUNTER — Other Ambulatory Visit: Payer: Self-pay

## 2017-03-09 DIAGNOSIS — R079 Chest pain, unspecified: Secondary | ICD-10-CM

## 2017-03-09 MED ORDER — NITROGLYCERIN 0.4 MG SL SUBL: 0.4000 mg | SUBLINGUAL_TABLET | SUBLINGUAL | 2 refills | Status: DC | PRN

## 2017-03-09 NOTE — Telephone Encounter (Signed)
Called, spoke with pt. Pt stated he was feeling a occasionally lightheaded while in recliner. Otherwise, pt feels okay. Reminded to change positions slowly. Pt denied CP or SOB. Informed could be r/t Amiodarone. Pt stated BP/HR reading today: BP 143/71, HR 69. Pt had questions r/t upcoming appt. Pt had questions r/t PFT appt on 7/11, asking if he should keep it. Informed it is very important to keep appt. The PFT is checking for possible lung toxicity r/t taking Amiodarone. Informed if symptoms remain or get worse, call our office. We can get pt scheduled to be seen at our office. Informed pt is scheduled with Dr. Lovena Le on 03/28/17, and with Tommye Standard, PA on 04/10/17. Informed will leave both appts. If needed, we can cancel appt with Tommye Standard, PA at next appt with Dr. Lovena Le on 03/28/17. Pt verbalized understanding and agreed with plan. Pt thanked me for calling.

## 2017-03-09 NOTE — Telephone Encounter (Signed)
New message    Pt c/o medication issue:  1. Name of Medication: amiodarone (PACERONE) 200 MG tablet  2. How are you currently taking this medication (dosage and times per day)? 200mg   3. Are you having a reaction (difficulty breathing--STAT)? no  4. What is your medication issue? Pt states this medication is making him weak and he is very concerned. Requests a call back.

## 2017-03-09 NOTE — Telephone Encounter (Signed)
New message     *STAT* If patient is at the pharmacy, call can be transferred to refill team.   1. Which medications need to be refilled? (please list name of each medication and dose if known) nitroGLYCERIN (NITROSTAT) 0.4 MG SL tablet  2. Which pharmacy/location (including street and city if local pharmacy) is medication to be sent to? [facility] on [address]  3. Do they need a 30 day or 90 day supply? 30 day supply

## 2017-03-10 NOTE — Telephone Encounter (Signed)
Called, spoke with pt. Informed of change from Atorvastatin to Rosuvastatin. Pt stated he has not heard anything from Dr. Andrew Au office. Informed I will f/u on Monday. Pt verbalized understanding.

## 2017-03-10 NOTE — Telephone Encounter (Signed)
Called, spoke with pt. Informed I rec'd fax from [facility] r/t possible interaction between Atorvastatin and Amiodarone. Informed I will check with our Pharmacist and Dr. Lovena Le to advise.

## 2017-03-10 NOTE — Telephone Encounter (Signed)
Called Dr. Andrew Au office. Left message for his nurse, Raquel Sarna. Informed possible drug interaction between Amidoarone and Lipitor. Inquired to see if it possible for pt to be switched to Crestor (Rosuvastation) instead of Atorvastatin. Crestor is a better choice with Amiodarone, due to how the medication is metabolized. Requested call back.

## 2017-03-10 NOTE — Telephone Encounter (Signed)
Called, spoke with Pharmacist at [facility]. Informed I spoke with our Pharmacist at our office. Taking Atorvastatin with Amiodarone increases risk for muscle aches and pain. Crestor is a better choice. I will forward to pt's PCP. Informed to please hold order until I get clarification r/t Atorvastain.

## 2017-03-10 NOTE — Telephone Encounter (Signed)
Received incoming call from Raquel Sarna, nurse with Dr. Andrew Au office. Raquel Sarna stated Dr Marisue Humble is fine with changing Atovastatin to Rosuvastation. Raquel Sarna stated they will send in new Rx. Informed I will contact [facility] to inform.

## 2017-03-10 NOTE — Telephone Encounter (Signed)
Follow Up:   Rodney Lee is wanting to speak with a nurse about his medication refill . Please call

## 2017-03-13 ENCOUNTER — Telehealth: Payer: Self-pay | Admitting: Internal Medicine

## 2017-03-13 NOTE — Telephone Encounter (Signed)
Called [facility], spoke with Pharmacist. Informed pt has been switched from Atorvastatin to Rosuvastatin. Pt needs Amiodarone to be filled. Pharmacist processed Rx.

## 2017-03-13 NOTE — Telephone Encounter (Signed)
Called, spoke with pt. Pt informed PCP placed Rx for Rosuvastatin. Pt stated med was sent to [facility]. Informed I will contact [facility] to let them know to go ahead and send the Amiodarone. Pt verbalized understanding.

## 2017-03-13 NOTE — Telephone Encounter (Signed)
New message    Pt c/o medication issue:  1. Name of Medication: atorvastatin (LIPITOR) 80 MG tablet, amiodarone (PACERONE) 200 MG tablet  2. How are you currently taking this medication (dosage and times per day)? 80mg  and 200 mg  3. Are you having a reaction (difficulty breathing--STAT)? Drug interaction that [facility] wants to make [place] aware of  4. What is your medication issue? DRUG INTERACTION

## 2017-03-15 ENCOUNTER — Other Ambulatory Visit: Payer: Self-pay | Admitting: Internal Medicine

## 2017-03-15 ENCOUNTER — Ambulatory Visit (INDEPENDENT_AMBULATORY_CARE_PROVIDER_SITE_OTHER): Payer: Medicare Other | Admitting: Internal Medicine

## 2017-03-15 DIAGNOSIS — T887XXA Unspecified adverse effect of drug or medicament, initial encounter: Secondary | ICD-10-CM

## 2017-03-15 DIAGNOSIS — T50905A Adverse effect of unspecified drugs, medicaments and biological substances, initial encounter: Secondary | ICD-10-CM

## 2017-03-15 LAB — PULMONARY FUNCTION TEST
DL/VA % pred: 108 %
DL/VA: 5.04 ml/min/mmHg/L
DLCO UNC % PRED: 87 %
DLCO unc: 29.34 ml/min/mmHg
FEF 25-75 PRE: 3.29 L/s
FEF 25-75 Post: 3.49 L/sec
FEF2575-%CHANGE-POST: 6 %
FEF2575-%PRED-POST: 160 %
FEF2575-%Pred-Pre: 150 %
FEV1-%CHANGE-POST: 1 %
FEV1-%PRED-POST: 98 %
FEV1-%Pred-Pre: 97 %
FEV1-POST: 3.06 L
FEV1-Pre: 3.02 L
FEV1FVC-%CHANGE-POST: 0 %
FEV1FVC-%Pred-Pre: 117 %
FEV6-%CHANGE-POST: 1 %
FEV6-%Pred-Post: 89 %
FEV6-%Pred-Pre: 88 %
FEV6-PRE: 3.57 L
FEV6-Post: 3.61 L
FEV6FVC-%Change-Post: 0 %
FEV6FVC-%PRED-PRE: 106 %
FEV6FVC-%Pred-Post: 106 %
FVC-%CHANGE-POST: 1 %
FVC-%PRED-PRE: 83 %
FVC-%Pred-Post: 84 %
FVC-POST: 3.62 L
FVC-PRE: 3.57 L
POST FEV1/FVC RATIO: 84 %
PRE FEV6/FVC RATIO: 100 %
Post FEV6/FVC ratio: 100 %
Pre FEV1/FVC ratio: 85 %

## 2017-03-15 NOTE — Progress Notes (Signed)
PFT done today. 

## 2017-03-16 ENCOUNTER — Telehealth: Payer: Self-pay | Admitting: Internal Medicine

## 2017-03-16 NOTE — Telephone Encounter (Signed)
Rx(s) sent to pharmacy electronically.  

## 2017-03-16 NOTE — Telephone Encounter (Signed)
SPOKE WITH PT  the patient  IS CALLING  AND IS COMPLAINING  OF LIGHTHEADEDNESS  IS  WANTING TO  TRY AND  DECREASE AMIODARONE TO  200 MG  A  DAY  IS  SUPPOSE  TO MAKE  THIS  CHANGE ON 03-19-17  TO SEE IF THIS  HELPS WITH  SYMPTOMS  DISCUSSED WITH AMBER SEILER NP  AND PT  MAY  DECREASE TO  200 MG  AS THIS  IS ONLY 3 DAYS  EARLY  AND  PT  NEEDS TO BE SEEN   PT  AWARE MAY  DECREASE AND  NEEDS TO KEEP UPCOMING APPT WITH DR  Lovena Le ALSO  PT  TO CALL IF  NO IMPROVEMENT PT  VERBALIZES UNDERSTANDING .Adonis Housekeeper

## 2017-03-16 NOTE — Telephone Encounter (Signed)
New message    Pt c/o medication issue:  1. Name of Medication: amiodarone (PACERONE) 200 MG tablet  2. How are you currently taking this medication (dosage and times per day)? 200mg   3. Are you having a reaction (difficulty breathing--STAT)? Light headed  4. What is your medication issue? Pt states that he wants to cut the dosage down because he is getting light headed. Requests a call back

## 2017-03-16 NOTE — Telephone Encounter (Signed)
This is Dr. Croitoru's pt 

## 2017-03-28 ENCOUNTER — Ambulatory Visit (INDEPENDENT_AMBULATORY_CARE_PROVIDER_SITE_OTHER): Payer: Medicare Other | Admitting: Internal Medicine

## 2017-03-28 ENCOUNTER — Encounter: Payer: Self-pay | Admitting: Internal Medicine

## 2017-03-28 VITALS — BP 124/74 | HR 67 | Ht 70.0 in | Wt 185.4 lb

## 2017-03-28 DIAGNOSIS — I5022 Chronic systolic (congestive) heart failure: Secondary | ICD-10-CM | POA: Diagnosis not present

## 2017-03-28 DIAGNOSIS — I255 Ischemic cardiomyopathy: Secondary | ICD-10-CM

## 2017-03-28 DIAGNOSIS — I4729 Other ventricular tachycardia: Secondary | ICD-10-CM

## 2017-03-28 DIAGNOSIS — Z95810 Presence of automatic (implantable) cardiac defibrillator: Secondary | ICD-10-CM | POA: Diagnosis not present

## 2017-03-28 DIAGNOSIS — I472 Ventricular tachycardia: Secondary | ICD-10-CM | POA: Diagnosis not present

## 2017-03-28 MED ORDER — AMIODARONE HCL 200 MG PO TABS: 200.0000 mg | ORAL_TABLET | Freq: Every day | ORAL | 3 refills | Status: DC

## 2017-03-28 NOTE — Patient Instructions (Addendum)
Medication Instructions:  Your physician has recommended you make the following change in your medication: decrease your Amiodarone to one tablet (200 mg) by mouth daily on 05/20/2017.  Labwork: None ordered.   Testing/Procedures: None ordered.   Follow-Up: Your physician recommends that you schedule a follow-up appointment in: end of September with Dr. Lovena Le.    Remote monitoring is used to monitor your ICD from home. This monitoring reduces the number of office visits required to check your device to one time per year. It allows [place] to keep an eye on the functioning of your device to ensure it is working properly. You are scheduled for a device check from home on 06/27/2017. You may send your transmission at any time that day. If you have a wireless device, the transmission will be sent automatically. After your physician reviews your transmission, you will receive a postcard with your next transmission date.    Any Other Special Instructions Will Be Listed Below (If Applicable).     If you need a refill on your cardiac medications before your next appointment, please call your pharmacy.

## 2017-03-28 NOTE — Progress Notes (Signed)
HPI [name] returns today for followup. He is a very pleasant 76 year old man with a long-standing ischemic cardiomyopathy, chronic systolic heart failure, VT, diabetes, and hypertension. He is status post ICD implantation. In the interim, he has had recurrent VT and was placed on amiodarone therapy. He denies chest pain, shortness of breath, or peripheral edema. No syncope.no ICD shock. He has had some fatigue and reduced appetite since starting the amiodarone.  Allergies  Allergen Reactions  . Lipitor [Atorvastatin Calcium]     Memory problems     Current Outpatient Prescriptions  Medication Sig Dispense Refill  . amiodarone (PACERONE) 200 MG tablet Take 200 mg by mouth 2 (two) times daily.    . carvedilol (COREG) 6.25 MG tablet TAKE 1 TABLET BY MOUTH TWO  TIMES DAILY WITH MEALS 180 tablet 1  . glimepiride (AMARYL) 4 MG tablet Take 4 mg by mouth daily before breakfast.    . hydrochlorothiazide (MICROZIDE) 12.5 MG capsule TAKE 1 CAPSULE BY MOUTH EVERY NIGHT (Patient taking differently: Take 12.5 mg by mouth daily. TAKE 1 CAPSULE BY MOUTH EVERY NIGHT) 90 capsule 1  . levothyroxine (SYNTHROID, LEVOTHROID) 125 MCG tablet Take 125 mcg by mouth daily.    Marland Kitchen losartan (COZAAR) 25 MG tablet TAKE 1 TABLET BY MOUTH  DAILY 90 tablet 1  . metFORMIN (GLUCOPHAGE) 500 MG tablet Take 500 mg by mouth 2 (two) times daily with a meal. Take one tablet in the morning and one tablet at night.    . Multiple Vitamin (MULTIVITAMIN) tablet Take 1 tablet by mouth daily.      . nitroGLYCERIN (NITROSTAT) 0.4 MG SL tablet Place 1 tablet (0.4 mg total) under the tongue every 5 (five) minutes as needed for chest pain. 25 tablet 2  . Rivaroxaban (XARELTO) 15 MG TABS tablet Take 1 tablet (15 mg total) by mouth daily with supper. 90 tablet 1  . rosuvastatin (CRESTOR) 40 MG tablet Take 40 mg by mouth daily.    . vitamin C (ASCORBIC ACID) 500 MG tablet Take 500 mg by mouth daily.      Rodney Lee ON 05/20/2017] amiodarone  (PACERONE) 200 MG tablet Take 1 tablet (200 mg total) by mouth daily. 90 tablet 3   No current facility-administered medications for this visit.      Past Medical History:  Diagnosis Date  . Atrial fibrillation ([place])    CHADS2Vasc is at least 5, on Xarelto  . CAD (coronary artery disease)   . cardiomyopathy    MDT ICD, Dr. Lovena Lee 2009  . CHF (congestive heart failure) ([place])   . Diabetes mellitus   . Exogenous obesity   . Hyperlipidemia   . Hypertension   . Hypothyroidism   . MI, old [place], PTCA LAD per pt  . Sinus bradycardia     ROS:   All systems reviewed and negative except as noted in the HPI.   Past Surgical History:  Procedure Laterality Date  . ANGIOPLASTY    . CARDIAC CATHETERIZATION  03/13/1994   ACUTE MI WITH TOTAL OCCLUSION OF MID LAD. REPERFUSION AFTER CROSSING WITH A GUIDEWIRE. POOR RIGHT TO LEFT COLLATERAL FLOW. Pt states had PTCA  . CARDIAC DEFIBRILLATOR PLACEMENT    . CARDIOVASCULAR STRESS TEST  03/11/2008   EF 30%. NO REVERSIBLE ISCHEMIA  . EP IMPLANTABLE DEVICE N/A 09/28/2015   Procedure:  ICD Generator Changeout;  Surgeon: Rodney Lance, MD;  Location: [facility];  Service: Cardiovascular;  Laterality: N/A;  . US ECHOCARDIOGRAPHY  04/26/2010   EF 35-40%. MODERATE LVH WITH MODERATE GLOBAL LV SYSTOLIC DYSFUNCTION AND IMPAIRED RELAXATION. MILD AORTIC SCLEROSIS WITHOUT STENOSIS. NORMAL PULMONARY ARTERY PRESSURE.  Marland Kitchen US ECHOCARDIOGRAPHY  10/07/2004   EF 30-35%     Family History  Problem Relation Age of Onset  . Heart disease Mother   . Heart attack Mother      Social History   Social History  . Marital status: Married    Spouse name: N/A  . Number of children: N/A  . Years of education: N/A   Occupational History  . Retired    Social History Main Topics  . Smoking status: Never Smoker  . Smokeless tobacco: Never Used  . Alcohol use No  . Drug use: No  . Sexual activity: Not on file   Other Topics Concern  . Not on  file   Social History Narrative   Lives with wife     BP 124/74   Pulse 67   Ht 5\' 10"  (1.778 m)   Wt 185 lb 6.4 oz (84.1 kg)   SpO2 98%   BMI 26.60 kg/m   Physical Exam:  Well appearing 76 year old man,NAD HEENT: Unremarkable Neck:  7 cm JVD, no thyromegally Lungs:  Clear with no wheezes, rales, or rhonchi. Well-healed ICD incision. HEART:  Regular rate rhythm, no murmurs, no rubs, no clicks Abd:  soft, positive bowel sounds, no organomegally, no rebound, no guarding Ext:  2 plus pulses, no edema, no cyanosis, no clubbing Skin:  No rashes no nodules Neuro:  CN II through XII intact, motor grossly intact  DEVICE  Normal device function.  See PaceArt for details.   ECG - nsr with atrial pacing  Assess/Plan: 1. VT - he has had no recurrent arrhythmias since starting the amiodarone. He will reduce his dose to 200 mg twice daily for several weeks and then down to 200 mg daily. 2. ICD - his Medtronic DDD ICD is working normally. 3. Chronic systolic heart failure - his symptoms are class 2. He will continue his current meds.  4. Weight loss - I suspect his amiodarone is the culprit and have explained that this should get better as we reduce his dose of amiodarone.  Rodney Lee.D.

## 2017-04-03 LAB — CUP PACEART INCLINIC DEVICE CHECK
Brady Statistic AP VS Percent: 98.92 %
Brady Statistic AS VS Percent: 1.03 %
Date Time Interrogation Session: 20180724180212
HIGH POWER IMPEDANCE MEASURED VALUE: 51 Ohm
HighPow Impedance: 69 Ohm
Implantable Lead Implant Date: 20090908
Implantable Lead Location: 753859
Implantable Lead Location: 753860
Implantable Lead Model: 5076
Lead Channel Impedance Value: 456 Ohm
Lead Channel Pacing Threshold Amplitude: 0.75 V
Lead Channel Pacing Threshold Pulse Width: 0.4 ms
Lead Channel Sensing Intrinsic Amplitude: 4.5 mV
Lead Channel Setting Sensing Sensitivity: 0.3 mV
MDC IDC LEAD IMPLANT DT: 20090908
MDC IDC MSMT BATTERY REMAINING LONGEVITY: 96 mo
MDC IDC MSMT BATTERY VOLTAGE: 3 V
MDC IDC MSMT LEADCHNL RA PACING THRESHOLD PULSEWIDTH: 0.4 ms
MDC IDC MSMT LEADCHNL RV IMPEDANCE VALUE: 703 Ohm
MDC IDC MSMT LEADCHNL RV IMPEDANCE VALUE: 779 Ohm
MDC IDC MSMT LEADCHNL RV PACING THRESHOLD AMPLITUDE: 1.25 V
MDC IDC MSMT LEADCHNL RV SENSING INTR AMPL: 12.125 mV
MDC IDC PG IMPLANT DT: 20170123
MDC IDC SET LEADCHNL RA PACING AMPLITUDE: 2 V
MDC IDC SET LEADCHNL RV PACING AMPLITUDE: 2.5 V
MDC IDC SET LEADCHNL RV PACING PULSEWIDTH: 0.4 ms
MDC IDC STAT BRADY AP VP PERCENT: 0.05 %
MDC IDC STAT BRADY AS VP PERCENT: 0 %
MDC IDC STAT BRADY RA PERCENT PACED: 98.82 %
MDC IDC STAT BRADY RV PERCENT PACED: 0.05 %

## 2017-04-10 ENCOUNTER — Ambulatory Visit: Payer: Medicare Other | Admitting: Physician Assistant

## 2017-04-11 ENCOUNTER — Telehealth: Payer: Self-pay | Admitting: Internal Medicine

## 2017-04-11 NOTE — Telephone Encounter (Signed)
Pt called because he said that he is taking Amiodarone 200 mg twice a day. He thinks that this medication is making his blood sugar to get low and he has to eat candy and drink OJ in the evenings. Pt takes only take pills not insuline for his diabetes. Pt also states that Dr. Lovena Le said that he was going to decreased this medication to 200 mg once daily on August 15 th. Pt  would like to have it decreased ASAP.

## 2017-04-11 NOTE — Telephone Encounter (Signed)
New message    Pt c/o medication issue:  1. Name of Medication: amiodarone (PACERONE) 200 MG tablet  / amiodarone (PACERONE) 200 MG tablet  2. How are you currently taking this medication (dosage and times per day)? Take 200 mg by mouth 2 (two) times daily./ Take 1 tablet (200 mg total) by mouth daily.  3. Are you having a reaction (difficulty breathing--STAT)? No   4. What is your medication issue? Blood sugar is dropping having to eat candy & drink orange juice.

## 2017-04-14 NOTE — Telephone Encounter (Signed)
Spoke with pt, aware to decrease amiodarone to 200 mg once daily as per dr taylor's last office note. The patient will call with continued problems

## 2017-06-07 ENCOUNTER — Encounter: Payer: Self-pay | Admitting: Internal Medicine

## 2017-06-07 ENCOUNTER — Ambulatory Visit (INDEPENDENT_AMBULATORY_CARE_PROVIDER_SITE_OTHER): Payer: Medicare Other | Admitting: Internal Medicine

## 2017-06-07 VITALS — BP 120/74 | HR 78 | Ht 70.5 in | Wt 183.8 lb

## 2017-06-07 DIAGNOSIS — Z23 Encounter for immunization: Secondary | ICD-10-CM

## 2017-06-07 DIAGNOSIS — I5022 Chronic systolic (congestive) heart failure: Secondary | ICD-10-CM

## 2017-06-07 DIAGNOSIS — I255 Ischemic cardiomyopathy: Secondary | ICD-10-CM | POA: Diagnosis not present

## 2017-06-07 DIAGNOSIS — I4729 Other ventricular tachycardia: Secondary | ICD-10-CM

## 2017-06-07 DIAGNOSIS — I472 Ventricular tachycardia: Secondary | ICD-10-CM

## 2017-06-07 LAB — CUP PACEART INCLINIC DEVICE CHECK
Battery Remaining Longevity: 93 mo
Battery Voltage: 2.97 V
Brady Statistic AP VS Percent: 98.14 %
Brady Statistic AS VS Percent: 1.8 %
Brady Statistic RA Percent Paced: 98.14 %
Brady Statistic RV Percent Paced: 0.07 %
HIGH POWER IMPEDANCE MEASURED VALUE: 57 Ohm
HighPow Impedance: 46 Ohm
Implantable Lead Implant Date: 20090908
Implantable Lead Location: 753859
Implantable Lead Location: 753860
Implantable Lead Model: 6947
Implantable Pulse Generator Implant Date: 20170123
Lead Channel Impedance Value: 779 Ohm
Lead Channel Pacing Threshold Amplitude: 0.75 V
Lead Channel Pacing Threshold Amplitude: 1 V
Lead Channel Pacing Threshold Pulse Width: 0.4 ms
Lead Channel Pacing Threshold Pulse Width: 0.4 ms
Lead Channel Sensing Intrinsic Amplitude: 4.125 mV
Lead Channel Sensing Intrinsic Amplitude: 4.5 mV
MDC IDC LEAD IMPLANT DT: 20090908
MDC IDC MSMT LEADCHNL RA IMPEDANCE VALUE: 437 Ohm
MDC IDC MSMT LEADCHNL RV IMPEDANCE VALUE: 703 Ohm
MDC IDC MSMT LEADCHNL RV SENSING INTR AMPL: 12.875 mV
MDC IDC MSMT LEADCHNL RV SENSING INTR AMPL: 9.875 mV
MDC IDC SESS DTM: 20181003161716
MDC IDC SET LEADCHNL RA PACING AMPLITUDE: 2 V
MDC IDC SET LEADCHNL RV PACING AMPLITUDE: 2.5 V
MDC IDC SET LEADCHNL RV PACING PULSEWIDTH: 0.4 ms
MDC IDC SET LEADCHNL RV SENSING SENSITIVITY: 0.3 mV
MDC IDC STAT BRADY AP VP PERCENT: 0.06 %
MDC IDC STAT BRADY AS VP PERCENT: 0 %

## 2017-06-07 NOTE — Progress Notes (Signed)
HPI Mr. Merryfield returns today for ongoing evaluation and management of VT, s/p ICD shock with initiation of amiodarone therapy, chronic systolic heart failure, and an ICM s/p remote MI. He initially felt poorly on high dose amiodarone but has improved as his dose has been decreased. He is now on 200 mg daily. No recent ICD therapies.  Allergies  Allergen Reactions  . Lipitor [Atorvastatin Calcium]     Memory problems     Current Outpatient Prescriptions  Medication Sig Dispense Refill  . amiodarone (PACERONE) 200 MG tablet Take 1 tablet (200 mg total) by mouth daily. 90 tablet 3  . canagliflozin (INVOKANA) 300 MG TABS tablet Take 300 mg by mouth daily before breakfast.    . carvedilol (COREG) 6.25 MG tablet TAKE 1 TABLET BY MOUTH TWO  TIMES DAILY WITH MEALS 180 tablet 1  . glimepiride (AMARYL) 1 MG tablet Take 1 mg by mouth daily as needed.    . hydrochlorothiazide (MICROZIDE) 12.5 MG capsule TAKE 1 CAPSULE BY MOUTH EVERY NIGHT (Patient taking differently: Take 12.5 mg by mouth daily. TAKE 1 CAPSULE BY MOUTH EVERY NIGHT) 90 capsule 1  . levothyroxine (SYNTHROID, LEVOTHROID) 125 MCG tablet Take 125 mcg by mouth daily.    Marland Kitchen losartan (COZAAR) 25 MG tablet TAKE 1 TABLET BY MOUTH  DAILY 90 tablet 1  . metFORMIN (GLUCOPHAGE) 500 MG tablet Take 500 mg by mouth 2 (two) times daily with a meal. Take one tablet in the morning and one tablet at night.    . Multiple Vitamin (MULTIVITAMIN) tablet Take 1 tablet by mouth daily.      . nitroGLYCERIN (NITROSTAT) 0.4 MG SL tablet Place 1 tablet (0.4 mg total) under the tongue every 5 (five) minutes as needed for chest pain. 25 tablet 2  . Rivaroxaban (XARELTO) 15 MG TABS tablet Take 1 tablet (15 mg total) by mouth daily with supper. 90 tablet 1  . rosuvastatin (CRESTOR) 40 MG tablet Take 40 mg by mouth daily.    . vitamin C (ASCORBIC ACID) 500 MG tablet Take 500 mg by mouth daily.       No current facility-administered medications for this visit.       Past Medical History:  Diagnosis Date  . Atrial fibrillation ([place])    CHADS2Vasc is at least 5, on Xarelto  . CAD (coronary artery disease)   . cardiomyopathy    MDT ICD, Dr. Lovena Le 2009  . CHF (congestive heart failure) ([place])   . Diabetes mellitus   . Exogenous obesity   . Hyperlipidemia   . Hypertension   . Hypothyroidism   . MI, old [place], PTCA LAD per pt  . Sinus bradycardia     ROS:   All systems reviewed and negative except as noted in the HPI.   Past Surgical History:  Procedure Laterality Date  . ANGIOPLASTY    . CARDIAC CATHETERIZATION  03/13/1994   ACUTE MI WITH TOTAL OCCLUSION OF MID LAD. REPERFUSION AFTER CROSSING WITH A GUIDEWIRE. POOR RIGHT TO LEFT COLLATERAL FLOW. Pt states had PTCA  . CARDIAC DEFIBRILLATOR PLACEMENT    . CARDIOVASCULAR STRESS TEST  03/11/2008   EF 30%. NO REVERSIBLE ISCHEMIA  . EP IMPLANTABLE DEVICE N/A 09/28/2015   Procedure:  ICD Generator Changeout;  Surgeon: Evans Lance, MD;  Location: [facility];  Service: Cardiovascular;  Laterality: N/A;  . US ECHOCARDIOGRAPHY  04/26/2010   EF 35-40%. MODERATE LVH WITH MODERATE GLOBAL LV SYSTOLIC DYSFUNCTION AND  IMPAIRED RELAXATION. MILD AORTIC SCLEROSIS WITHOUT STENOSIS. NORMAL PULMONARY ARTERY PRESSURE.  Marland Kitchen US ECHOCARDIOGRAPHY  10/07/2004   EF 30-35%     Family History  Problem Relation Age of Onset  . Heart disease Mother   . Heart attack Mother      Social History   Social History  . Marital status: Married    Spouse name: N/A  . Number of children: N/A  . Years of education: N/A   Occupational History  . Retired    Social History Main Topics  . Smoking status: Never Smoker  . Smokeless tobacco: Never Used  . Alcohol use No  . Drug use: No  . Sexual activity: Not on file   Other Topics Concern  . Not on file   Social History Narrative   Lives with wife     BP 120/74   Pulse 78   Ht 5' 10.5" (1.791 m)   Wt 183 lb 12.8 oz (83.4 kg)   SpO2  99%   BMI 26.00 kg/m   Physical Exam:  Well appearing 76 yo man, NAD HEENT: Unremarkable Neck: 6 cm JVD, no thyromegally Lymphatics:  No adenopathy Back:  No CVA tenderness Lungs:  Clear with no wheezes HEART:  Regular rate rhythm, no murmurs, no rubs, no clicks Abd:  soft, positive bowel sounds, no organomegally, no rebound, no guarding Ext:  2 plus pulses, no edema, no cyanosis, no clubbing Skin:  No rashes no nodules Neuro:  CN II through XII intact, motor grossly intact  DEVICE  Normal device function.  See PaceArt for details.   Assess/Plan: 1. VT - he has maintained NSR with no recurrent VT. He will continue amio 200 mg daily. 2. Chronic systolic heart failure - his symptoms are class 2 and well compensated. He will continue his current meds and maintain a low sodium diet.  3. ICD - his medtronic DDD ICD is working normally. Almost 8 years of battery longevity. 4. Ischemic CM - he is active and denies anginal symptoms. No change in medical therapy. I have encouraged the patient to increase his physical activity.  Rodney Lee.D.

## 2017-06-07 NOTE — Patient Instructions (Addendum)
Medication Instructions:  Your physician recommends that you continue on your current medications as directed. Please refer to the Current Medication list given to you today.  Labwork: None ordered.  Testing/Procedures: None ordered.  Follow-Up: Your physician wants you to follow-up in: 6 months with Dr. Lovena Le.   You will receive a reminder letter in the mail two months in advance. If you don't receive a letter, please call our office to schedule the follow-up appointment.  Remote monitoring is used to monitor your ICD from home. This monitoring reduces the number of office visits required to check your device to one time per year. It allows [place] to keep an eye on the functioning of your device to ensure it is working properly. You are scheduled for a device check from home on 09/06/2016. You may send your transmission at any time that day. If you have a wireless device, the transmission will be sent automatically. After your physician reviews your transmission, you will receive a postcard with your next transmission date.  Influenza vaccine given.   Any Other Special Instructions Will Be Listed Below (If Applicable).     If you need a refill on your cardiac medications before your next appointment, please call your pharmacy.

## 2017-06-09 ENCOUNTER — Encounter: Payer: Medicare Other | Admitting: Internal Medicine

## 2017-07-10 ENCOUNTER — Other Ambulatory Visit: Payer: Self-pay | Admitting: Cardiovascular Disease

## 2017-09-06 ENCOUNTER — Ambulatory Visit (INDEPENDENT_AMBULATORY_CARE_PROVIDER_SITE_OTHER): Payer: Medicare Other | Admitting: *Deleted

## 2017-09-06 DIAGNOSIS — I255 Ischemic cardiomyopathy: Secondary | ICD-10-CM | POA: Diagnosis not present

## 2017-09-07 ENCOUNTER — Other Ambulatory Visit: Payer: Self-pay

## 2017-09-07 MED ORDER — HYDROCHLOROTHIAZIDE 12.5 MG PO CAPS: ORAL_CAPSULE | ORAL | 2 refills | Status: DC

## 2017-09-07 MED ORDER — HYDROCHLOROTHIAZIDE 12.5 MG PO CAPS: ORAL_CAPSULE | ORAL | 1 refills | Status: DC

## 2017-09-07 NOTE — Telephone Encounter (Signed)
Pt's medication was sent to pt's pharmacy as requested. Confirmation received.  °

## 2017-09-07 NOTE — Progress Notes (Signed)
Remote ICD transmission.   

## 2017-09-07 NOTE — Addendum Note (Signed)
Addended by: Derl Barrow on: 09/07/2017 10:43 AM   Modules accepted: Orders

## 2017-09-08 ENCOUNTER — Encounter: Payer: Self-pay | Admitting: Cardiology

## 2017-09-11 ENCOUNTER — Other Ambulatory Visit: Payer: Self-pay | Admitting: Cardiovascular Disease

## 2017-09-11 LAB — CUP PACEART REMOTE DEVICE CHECK
Battery Remaining Longevity: 87 mo
Battery Voltage: 2.96 V
Brady Statistic AP VP Percent: 0.08 %
Brady Statistic AP VS Percent: 96.91 %
Brady Statistic AS VP Percent: 0 %
Brady Statistic AS VS Percent: 3.01 %
Brady Statistic RV Percent Paced: 0.11 %
Date Time Interrogation Session: 20190102083522
HIGH POWER IMPEDANCE MEASURED VALUE: 55 Ohm
HighPow Impedance: 43 Ohm
Implantable Lead Implant Date: 20090908
Implantable Lead Implant Date: 20090908
Implantable Lead Location: 753859
Implantable Lead Model: 5076
Lead Channel Impedance Value: 437 Ohm
Lead Channel Pacing Threshold Amplitude: 0.875 V
Lead Channel Pacing Threshold Pulse Width: 0.4 ms
Lead Channel Sensing Intrinsic Amplitude: 10.375 mV
Lead Channel Sensing Intrinsic Amplitude: 10.375 mV
Lead Channel Sensing Intrinsic Amplitude: 3.875 mV
Lead Channel Setting Pacing Amplitude: 2 V
Lead Channel Setting Pacing Amplitude: 2.5 V
Lead Channel Setting Pacing Pulse Width: 0.4 ms
MDC IDC LEAD LOCATION: 753860
MDC IDC MSMT LEADCHNL RA PACING THRESHOLD AMPLITUDE: 0.625 V
MDC IDC MSMT LEADCHNL RA PACING THRESHOLD PULSEWIDTH: 0.4 ms
MDC IDC MSMT LEADCHNL RA SENSING INTR AMPL: 3.875 mV
MDC IDC MSMT LEADCHNL RV IMPEDANCE VALUE: 646 Ohm
MDC IDC MSMT LEADCHNL RV IMPEDANCE VALUE: 703 Ohm
MDC IDC PG IMPLANT DT: 20170123
MDC IDC SET LEADCHNL RV SENSING SENSITIVITY: 0.3 mV
MDC IDC STAT BRADY RA PERCENT PACED: 96.91 %

## 2017-09-11 NOTE — Telephone Encounter (Signed)
Rx(s) sent to pharmacy electronically.  

## 2017-09-13 ENCOUNTER — Other Ambulatory Visit: Payer: Self-pay | Admitting: Family Medicine

## 2017-09-13 DIAGNOSIS — N184 Chronic kidney disease, stage 4 (severe): Secondary | ICD-10-CM

## 2017-09-14 ENCOUNTER — Ambulatory Visit
Admission: RE | Admit: 2017-09-14 | Discharge: 2017-09-14 | Disposition: A | Discharge status: Home / Self Care | Payer: Medicare Other | Source: Ambulatory Visit | Attending: Family Medicine | Admitting: Family Medicine

## 2017-09-14 DIAGNOSIS — N184 Chronic kidney disease, stage 4 (severe): Secondary | ICD-10-CM

## 2017-11-28 ENCOUNTER — Telehealth: Payer: Self-pay | Admitting: Cardiovascular Disease

## 2017-11-28 NOTE — Telephone Encounter (Signed)
Received incoming records from [place] for upcoming appointment on 12/18/17 @ 9am with Dr. Sallyanne Kuster. Records given to [facility] in Medical Records. 11/28/17 ab

## 2017-12-06 ENCOUNTER — Ambulatory Visit (INDEPENDENT_AMBULATORY_CARE_PROVIDER_SITE_OTHER): Payer: Medicare Other | Admitting: *Deleted

## 2017-12-06 DIAGNOSIS — I255 Ischemic cardiomyopathy: Secondary | ICD-10-CM

## 2017-12-06 NOTE — Progress Notes (Signed)
Remote ICD transmission.   

## 2017-12-07 ENCOUNTER — Encounter: Payer: Self-pay | Admitting: Cardiology

## 2017-12-08 ENCOUNTER — Encounter: Payer: Medicare Other | Admitting: Internal Medicine

## 2017-12-18 ENCOUNTER — Ambulatory Visit: Payer: Medicare Other | Admitting: Cardiovascular Disease

## 2017-12-18 ENCOUNTER — Encounter: Payer: Self-pay | Admitting: Cardiovascular Disease

## 2017-12-18 VITALS — BP 108/54 | HR 63 | Ht 70.5 in | Wt 199.0 lb

## 2017-12-18 DIAGNOSIS — I447 Left bundle-branch block, unspecified: Secondary | ICD-10-CM

## 2017-12-18 DIAGNOSIS — N184 Chronic kidney disease, stage 4 (severe): Secondary | ICD-10-CM

## 2017-12-18 DIAGNOSIS — E0822 Diabetes mellitus due to underlying condition with diabetic chronic kidney disease: Secondary | ICD-10-CM | POA: Diagnosis not present

## 2017-12-18 DIAGNOSIS — I2089 Other forms of angina pectoris: Secondary | ICD-10-CM

## 2017-12-18 DIAGNOSIS — Z95810 Presence of automatic (implantable) cardiac defibrillator: Secondary | ICD-10-CM | POA: Diagnosis not present

## 2017-12-18 DIAGNOSIS — N183 Chronic kidney disease, stage 3 unspecified: Secondary | ICD-10-CM

## 2017-12-18 DIAGNOSIS — E785 Hyperlipidemia, unspecified: Secondary | ICD-10-CM

## 2017-12-18 DIAGNOSIS — I472 Ventricular tachycardia, unspecified: Secondary | ICD-10-CM

## 2017-12-18 DIAGNOSIS — I208 Other forms of angina pectoris: Secondary | ICD-10-CM

## 2017-12-18 DIAGNOSIS — I480 Paroxysmal atrial fibrillation: Secondary | ICD-10-CM

## 2017-12-18 DIAGNOSIS — I5022 Chronic systolic (congestive) heart failure: Secondary | ICD-10-CM

## 2017-12-18 DIAGNOSIS — I4729 Other ventricular tachycardia: Secondary | ICD-10-CM

## 2017-12-18 MED ORDER — ISOSORBIDE MONONITRATE ER 30 MG PO TB24: 30.0000 mg | ORAL_TABLET | Freq: Every day | ORAL | 0 refills | Status: DC

## 2017-12-18 MED ORDER — ISOSORBIDE MONONITRATE ER 30 MG PO TB24: 30.0000 mg | ORAL_TABLET | Freq: Every day | ORAL | 3 refills | Status: DC

## 2017-12-18 NOTE — Patient Instructions (Signed)
Medication Instructions: Dr Sallyanne Kuster has recommended making the following medication changes: 1. START Isosorbide 30 mg - take 1 tablet daily  Labwork: NONE ORDERED  Testing/Procedures: 1. [place] Stress test - Your physician has requested that you have a lexiscan myoview. For further information please visit [email or web address]. Please follow instruction sheet, as given.  Follow-up: Dr Sallyanne Kuster recommends that you schedule a follow-up appointment in 6 months. You will receive a reminder letter in the mail two months in advance. If you don't receive a letter, please call our office to schedule the follow-up appointment.  If you need a refill on your cardiac medications before your next appointment, please call your pharmacy.

## 2017-12-18 NOTE — Progress Notes (Signed)
Cardiology Office Note    Date:  12/21/2017   ID:  Rodney Lee, DOB Nov 22, 1940, MRN [id number]  PCP:  Gaynelle Arabian, MD  Cardiologist:   [name], MD   Chief Complaint  Patient presents with  . Follow-up    12 months  . Chest Pain    When lifting.    History of Present Illness:  Rodney Lee is a 77 y.o. male , formerly patient of Dr. Dr. Mare Ferrari. Dr. Cristopher Peru follows his defibrillator.  He is here to follow-up for coronary artery disease.  He has a long-standing history of coronary artery disease, following a large anterolateral myocardial infarction in 1995 treated with angioplasty. He has not required repeat revascularization since that time. Ever since then he has had moderately-to-severely depressed left ventricular systolic function (EF 41-74% by his  echo in December 2016, but estimated at 40-45% by echo in June 2018), but has had very well compensated congestive heart failure.  He denies exertional dyspnea and his most recent echo did not show evidence of elevated filling pressures he received a defibrillator for primary prevention in 2009 and underwent a generator change out in January 2017.  Last summer he had an appropriate defibrillator discharge for VT and he was started on amiodarone.  He has not had any new events since that time.  He tolerated the loading dose of amiodarone poorly, but now that he is on maintenance dose he has no complaints of amiodarone side effects.  Just over the last 2-3 months he has begun developing exertional chest discomfort.  It is mild and only occurs when he exerts himself more than usual.  He has not taken any sublingual nitroglycerin.  He has not had any further defibrillator shocks.  He still denies exertional dyspnea does not have orthopnea, PND, leg edema or other signs of volume overload.  He has also been started on Invokana for diabetes which is acting probably as his surrogate diuretic.  He has not had any focal  neurological events or bleeding, dizziness or syncope.  In August 2017 he presented with weakness and was found to be in sustained VT at 178 bpm, relatively narrow QRS complex. He was treated with overdrive pacing via his defibrillator with improvement in symptoms. VT detection zone was dropped to [place] 170 bpm. On 08/20/2016 he had ventricular tachycardia with unsuccessful ATP and received 2 consecutive defibrillator shocks. He did not experience syncope.  On March 01, 2017, doing appear to have severe hyperglycemia he had 3 consecutive defibrillator shocks although he reports being asymptomatic before the shocks.  Device interrogation showed ventricular fibrillation and ventricular tachycardia.  He was started on amiodarone.  Echo during that admission showed improved LVEF at 40-45% with old anteroapical scar. .  In the past, his device has detected asymptomatic paroxysmal atrial fibrillation. He takes anticoagulation with Xarelto and has not had bleeding problems. He has hyperlipidemia and diabetes mellitus, followed by Dr. Marisue Humble.  He has chronic kidney disease followed by Dr. Moshe Cipro, with recent worsening of creatinine to stage IV (creatinine 2.4) his anticoagulant dose has been adjusted for this.  He has a bland urinalysis.  Past Medical History:  Diagnosis Date  . Atrial fibrillation ([place])    CHADS2Vasc is at least 5, on Xarelto  . CAD (coronary artery disease)   . cardiomyopathy    MDT ICD, Dr. Lovena Le 2009  . CHF (congestive heart failure) ([place])   . Diabetes mellitus   . Exogenous obesity   .  Hyperlipidemia   . Hypertension   . Hypothyroidism   . MI, old [place], PTCA LAD per pt  . Sinus bradycardia     Past Surgical History:  Procedure Laterality Date  . ANGIOPLASTY    . CARDIAC CATHETERIZATION  03/13/1994   ACUTE MI WITH TOTAL OCCLUSION OF MID LAD. REPERFUSION AFTER CROSSING WITH A GUIDEWIRE. POOR RIGHT TO LEFT COLLATERAL FLOW. Pt states had PTCA  . CARDIAC  DEFIBRILLATOR PLACEMENT    . CARDIOVASCULAR STRESS TEST  03/11/2008   EF 30%. NO REVERSIBLE ISCHEMIA  . EP IMPLANTABLE DEVICE N/A 09/28/2015   Procedure:  ICD Generator Changeout;  Surgeon: Evans Lance, MD;  Location: [facility];  Service: Cardiovascular;  Laterality: N/A;  . US ECHOCARDIOGRAPHY  04/26/2010   EF 35-40%. MODERATE LVH WITH MODERATE GLOBAL LV SYSTOLIC DYSFUNCTION AND IMPAIRED RELAXATION. MILD AORTIC SCLEROSIS WITHOUT STENOSIS. NORMAL PULMONARY ARTERY PRESSURE.  Marland Kitchen US ECHOCARDIOGRAPHY  10/07/2004   EF 30-35%    Current Medications: Outpatient Medications Prior to Visit  Medication Sig Dispense Refill  . amiodarone (PACERONE) 200 MG tablet Take 1 tablet (200 mg total) by mouth daily. (Patient taking differently: Take 200 mg by mouth 2 (two) times daily. ) 90 tablet 3  . canagliflozin (INVOKANA) 300 MG TABS tablet Take 300 mg by mouth daily before breakfast.    . carvedilol (COREG) 6.25 MG tablet Take 1 tablet (6.25 mg total) by mouth 2 (two) times daily with a meal. 180 tablet 0  . glimepiride (AMARYL) 1 MG tablet Take 1 mg by mouth daily as needed.    Marland Kitchen levothyroxine (SYNTHROID, LEVOTHROID) 125 MCG tablet Take 125 mcg by mouth daily.    . metFORMIN (GLUCOPHAGE) 500 MG tablet Take 500 mg by mouth 2 (two) times daily with a meal. Take one tablet in the morning and one tablet at night.    . Multiple Vitamin (MULTIVITAMIN) tablet Take 1 tablet by mouth daily.      . nitroGLYCERIN (NITROSTAT) 0.4 MG SL tablet Place 1 tablet (0.4 mg total) under the tongue every 5 (five) minutes as needed for chest pain. 25 tablet 2  . rosuvastatin (CRESTOR) 40 MG tablet Take 40 mg by mouth daily.    . vitamin C (ASCORBIC ACID) 500 MG tablet Take 500 mg by mouth daily.      Alveda Reasons 15 MG TABS tablet TAKE 1 TABLET BY MOUTH  DAILY WITH SUPPER 90 tablet 1  . hydrochlorothiazide (MICROZIDE) 12.5 MG capsule TAKE 1 CAPSULE BY MOUTH EVERY NIGHT 90 capsule 2  . losartan (COZAAR) 25 MG tablet Take 1  tablet (25 mg total) by mouth daily. 90 tablet 0   No facility-administered medications prior to visit.      Allergies:   Lipitor [atorvastatin calcium]   Social History   Socioeconomic History  . Marital status: Married    Spouse name: Not on file  . Number of children: Not on file  . Years of education: Not on file  . Highest education level: Not on file  Occupational History  . Occupation: Retired  Scientific laboratory technician  . Financial resource strain: Not on file  . Food insecurity:    Worry: Not on file    Inability: Not on file  . Transportation needs:    Medical: Not on file    Non-medical: Not on file  Tobacco Use  . Smoking status: Never Smoker  . Smokeless tobacco: Never Used  Substance and Sexual Activity  . Alcohol use:  No  . Drug use: No  . Sexual activity: Not on file  Lifestyle  . Physical activity:    Days per week: Not on file    Minutes per session: Not on file  . Stress: Not on file  Relationships  . Social connections:    Talks on phone: Not on file    Gets together: Not on file    Attends religious service: Not on file    Active member of club or organization: Not on file    Attends meetings of clubs or organizations: Not on file    Relationship status: Not on file  Other Topics Concern  . Not on file  Social History Narrative   Lives with wife     Family History:  The patient's family history includes Heart attack in his mother; Heart disease in his mother.   ROS:   Please see the history of present illness.    ROS All other systems reviewed and are negative.   PHYSICAL EXAM:   VS:  BP (!) 108/54 (BP Location: Left Arm, Patient Position: Sitting, Cuff Size: Normal)   Pulse 63   Ht 5' 10.5" (1.791 m)   Wt 199 lb (90.3 kg)   BMI 28.15 kg/m     General: Alert, oriented x3, no distress, looks comfortable.  He is mildly overweight. Head: no evidence of trauma, PERRL, EOMI, no exophtalmos or lid lag, no myxedema, no xanthelasma; normal ears, nose  and oropharynx Neck: normal jugular venous pulsations and no hepatojugular reflux; brisk carotid pulses without delay and no carotid bruits Chest: clear to auscultation, no signs of consolidation by percussion or palpation, normal fremitus, symmetrical and full respiratory excursions Cardiovascular: normal position and quality of the apical impulse, regular rhythm, normal first and paradoxically split second heart sounds, no murmurs, rubs or gallops Abdomen: no tenderness or distention, no masses by palpation, no abnormal pulsatility or arterial bruits, normal bowel sounds, no hepatosplenomegaly Extremities: no clubbing, cyanosis or edema; 2+ radial, ulnar and brachial pulses bilaterally; 2+ right femoral, posterior tibial and dorsalis pedis pulses; 2+ left femoral, posterior tibial and dorsalis pedis pulses; no subclavian or femoral bruits Neurological: grossly nonfocal Psych: Normal mood and affect   Wt Readings from Last 3 Encounters:  12/18/17 199 lb (90.3 kg)  06/07/17 183 lb 12.8 oz (83.4 kg)  03/28/17 185 lb 6.4 oz (84.1 kg)      Studies/Labs Reviewed:   EKG:  EKG is ordered today.  The ekg ordered today demonstrates atrial paced, ventricular sensed rhythm with long AV delay 290 ms, left bundle branch block (QRS 158 ms), QTC 485 ms   Recent Labs: 03/01/2017: Hemoglobin 16.3; Platelets 122 03/02/2017: Magnesium 2.1; TSH 3.127 03/03/2017: BUN 24; Creatinine, Ser 1.57; Potassium 3.9; Sodium 138   Lipid Panel    Component Value Date/Time   CHOL 242 (H) 06/20/2011 1039   TRIG 288.0 (H) 06/20/2011 1039   HDL 33.10 (L) 06/20/2011 1039   CHOLHDL 7 06/20/2011 1039   VLDL 57.6 (H) 06/20/2011 1039   LDLDIRECT 159.8 06/20/2011 1039    August 30, 2017 labs total cholesterol 120, HDL 40, LDL 57, triglycerides 109 TSH 0.55, creatinine 2.43, potassium 4.4, hemoglobin A1c 7.3%, normal liver function tests  Additional studies/ records that were reviewed today include:  [place] from [place]  hospitalization with ventricular arrhythmia and last office visits with Dr. Lovena Le,  remote device checks, echocardiogram  ASSESSMENT:    1. Ventricular tachycardia (paroxysmal) ([place])   2. Chronic systolic CHF (congestive  heart failure) ([place])   3. PAF (paroxysmal atrial fibrillation) ([place])   4. Stable angina ([place])   5. LBBB (left bundle branch block)   6. ICD (implantable cardioverter-defibrillator) in place   7. Dyslipidemia   8. Diabetes mellitus due to underlying condition with stage 3 chronic kidney disease, without long-term current use of insulin ([place])   9. CKD (chronic kidney disease) stage 4, GFR 15-29 ml/min ([facility])      PLAN:  In order of problems listed above:  1. VT: Most recent treatment from his defibrillator was in June 2018 and was appropriate for VT/VF, even though he was not symptomatic.  On amiodarone. 2. CHF: NYHA functional class I, clinically euvolemic without loop diuretics, possibly helped by the fact that he takes an SGL T-2 inhibitor.  He does not take RAAS inhibitors due to his advanced kidney disease. 3. AFib: Asymptomatic atrial fibrillation has been previously detected by his device, but none since his last visit.. On appropriate anticoagulation for CHADSVasc 5 (age 70, CAD, CHF, DM). No history of known stroke or TIA. 4. CAD: He has been experiencing CCS class II angina pectoris and has started just about 2 or 3 months ago.  I think this is the first time he had angina since his myocardial infarction 20 years ago.  We will schedule him for a myocardial perfusion study.  I am reluctant to recommend coronary angiography unless there is clear-cut evidence of a large area of myocardial ischemia, due to his advanced kidney problems.  We will try to improve his symptoms of with antianginal medications. 5. LBBB: Option for upgrade of his device to CRT-D if he should develop significant heart failure. With recent echo showing EF 40% and class I status, not likely to be an  issue anytime soon. 6. ICD: Followed in EP clinic by Dr. Lovena Le. 7. HLP: On statin, wit LDL less than 70. 8. DM: followed by Dr. Marisue Humble. SGLT2 inhibitor especially since he has congestive heart failure, will have to monitor closely as his kidney function deteriorates. 9. CKD: Followed by Dr. Moshe Cipro, recently seems to have progressed to CKD stage IV.    Medication Adjustments/Labs and Tests Ordered: Current medicines are reviewed at length with the patient today.  Concerns regarding medicines are outlined above.  Medication changes, Labs and Tests ordered today are listed in the Patient Instructions below. Patient Instructions  Medication Instructions: Dr Sallyanne Kuster has recommended making the following medication changes: 1. START Isosorbide 30 mg - take 1 tablet daily  Labwork: NONE ORDERED  Testing/Procedures: 1. [place] Stress test - Your physician has requested that you have a lexiscan myoview. For further information please visit [email or web address]. Please follow instruction sheet, as given.  Follow-up: Dr Sallyanne Kuster recommends that you schedule a follow-up appointment in 6 months. You will receive a reminder letter in the mail two months in advance. If you don't receive a letter, please call our office to schedule the follow-up appointment.  If you need a refill on your cardiac medications before your next appointment, please call your pharmacy.    Signed, [name], MD  12/21/2017 4:48 PM    [address], [place], [place]  [address] Phone: [phone]; Fax: [phone]

## 2017-12-19 LAB — CUP PACEART REMOTE DEVICE CHECK
Battery Voltage: 2.95 V
Brady Statistic AP VP Percent: 0.13 %
Brady Statistic RA Percent Paced: 96.46 %
Brady Statistic RV Percent Paced: 0.17 %
Date Time Interrogation Session: 20190403083824
HighPow Impedance: 43 Ohm
HighPow Impedance: 53 Ohm
Implantable Lead Implant Date: 20090908
Implantable Lead Location: 753860
Implantable Lead Model: 5076
Implantable Pulse Generator Implant Date: 20170123
Lead Channel Impedance Value: 589 Ohm
Lead Channel Impedance Value: 646 Ohm
Lead Channel Pacing Threshold Amplitude: 0.875 V
Lead Channel Sensing Intrinsic Amplitude: 12.75 mV
Lead Channel Sensing Intrinsic Amplitude: 12.75 mV
Lead Channel Setting Pacing Amplitude: 2 V
Lead Channel Setting Pacing Amplitude: 2.5 V
Lead Channel Setting Pacing Pulse Width: 0.4 ms
Lead Channel Setting Sensing Sensitivity: 0.3 mV
MDC IDC LEAD IMPLANT DT: 20090908
MDC IDC LEAD LOCATION: 753859
MDC IDC MSMT BATTERY REMAINING LONGEVITY: 81 mo
MDC IDC MSMT LEADCHNL RA IMPEDANCE VALUE: 399 Ohm
MDC IDC MSMT LEADCHNL RA PACING THRESHOLD AMPLITUDE: 0.625 V
MDC IDC MSMT LEADCHNL RA PACING THRESHOLD PULSEWIDTH: 0.4 ms
MDC IDC MSMT LEADCHNL RA SENSING INTR AMPL: 4.75 mV
MDC IDC MSMT LEADCHNL RA SENSING INTR AMPL: 4.75 mV
MDC IDC MSMT LEADCHNL RV PACING THRESHOLD PULSEWIDTH: 0.4 ms
MDC IDC STAT BRADY AP VS PERCENT: 96.65 %
MDC IDC STAT BRADY AS VP PERCENT: 0 %
MDC IDC STAT BRADY AS VS PERCENT: 3.22 %

## 2017-12-30 ENCOUNTER — Other Ambulatory Visit: Payer: Self-pay | Admitting: Cardiovascular Disease

## 2018-01-01 NOTE — Telephone Encounter (Signed)
REFILLL

## 2018-01-11 ENCOUNTER — Telehealth (HOSPITAL_COMMUNITY): Payer: Self-pay

## 2018-01-11 NOTE — Telephone Encounter (Signed)
Encounter complete. 

## 2018-01-16 ENCOUNTER — Ambulatory Visit (HOSPITAL_COMMUNITY)
Admission: RE | Admit: 2018-01-16 | Discharge: 2018-01-16 | Disposition: A | Discharge status: Home / Self Care | Payer: Medicare Other | Source: Ambulatory Visit | Attending: Cardiovascular Disease | Admitting: Cardiovascular Disease

## 2018-01-16 DIAGNOSIS — I208 Other forms of angina pectoris: Secondary | ICD-10-CM | POA: Insufficient documentation

## 2018-01-16 LAB — MYOCARDIAL PERFUSION IMAGING
CSEPPHR: 77 {beats}/min
LV dias vol: 280 mL (ref 62–150)
LVSYSVOL: 212 mL
Rest HR: 61 {beats}/min
SDS: 13
SRS: 26
SSS: 39
TID: 0.89

## 2018-01-16 MED ORDER — TECHNETIUM TC 99M TETROFOSMIN IV KIT
10.0000 | PACK | Freq: Once | INTRAVENOUS | Status: AC | PRN
  Administered 2018-01-16: 10 via INTRAVENOUS
  Filled 2018-01-16: qty 10

## 2018-01-16 MED ORDER — TECHNETIUM TC 99M TETROFOSMIN IV KIT
30.1000 | PACK | Freq: Once | INTRAVENOUS | Status: AC | PRN
  Administered 2018-01-16: 30.1 via INTRAVENOUS
  Filled 2018-01-16: qty 31

## 2018-01-16 MED ORDER — REGADENOSON 0.4 MG/5ML IV SOLN
0.4000 mg | Freq: Once | INTRAVENOUS | Status: AC
  Administered 2018-01-16: 0.4 mg via INTRAVENOUS

## 2018-01-17 ENCOUNTER — Telehealth: Payer: Self-pay

## 2018-01-17 MED ORDER — ISOSORBIDE MONONITRATE ER 60 MG PO TB24: 60.0000 mg | ORAL_TABLET | Freq: Every day | ORAL | 3 refills | Status: DC

## 2018-01-17 NOTE — Telephone Encounter (Signed)
-----   Message from [name], MD sent at 01/16/2018  2:43 PM EDT ----- Reviewed the nuclear study findings with Rodney Lee. Looked over the images and most of the problem was extensive scar, with only very limited ischemia. Because of advanced chronic kidney disease and the relative stability of his symptoms, I would favor medical management over repeat cardiac catheterization. He has noticed partial improvement in his exertional angina with isosorbide and has not had any side effects.  We will increase the isosorbide mononitrate to 60 mg once daily. He also inquired about the risks associated with Xarelto, after hearing about lawsuits surrounding the drug.  We discussed the fact that potential bleeding side effects are real and can be very serious or life-threatening, but that the risk of stroke is higher than the risk of bleeding.  He will continue taking Xarelto and will try to see if he can get it from the [place] system due to its high cost.

## 2018-02-13 ENCOUNTER — Encounter: Payer: Self-pay | Admitting: Internal Medicine

## 2018-02-13 ENCOUNTER — Ambulatory Visit: Payer: Medicare Other | Admitting: Internal Medicine

## 2018-02-13 VITALS — BP 100/56 | HR 67 | Ht 71.0 in | Wt 198.2 lb

## 2018-02-13 DIAGNOSIS — I472 Ventricular tachycardia: Secondary | ICD-10-CM

## 2018-02-13 DIAGNOSIS — I255 Ischemic cardiomyopathy: Secondary | ICD-10-CM | POA: Diagnosis not present

## 2018-02-13 DIAGNOSIS — Z95810 Presence of automatic (implantable) cardiac defibrillator: Secondary | ICD-10-CM

## 2018-02-13 DIAGNOSIS — I4729 Other ventricular tachycardia: Secondary | ICD-10-CM

## 2018-02-13 DIAGNOSIS — I5022 Chronic systolic (congestive) heart failure: Secondary | ICD-10-CM

## 2018-02-13 NOTE — Patient Instructions (Signed)

## 2018-02-13 NOTE — Progress Notes (Addendum)
HPI [name] returns today for ongoing evaluation and management of VT, s/p ICD shock with initiation of amiodarone therapy, chronic systolic heart failure, and an ICM s/p remote MI. He initially felt poorly on high dose amiodarone but has improved as his dose has been decreased. He is now on 200 mg daily. No palpitations. No cough. No recent ICD therapies.   Allergies  Allergen Reactions  . Lipitor [Atorvastatin Calcium]     Memory problems     Current Outpatient Medications  Medication Sig Dispense Refill  . amiodarone (PACERONE) 200 MG tablet Take 1 tablet (200 mg total) by mouth daily. (Patient taking differently: Take 200 mg by mouth 2 (two) times daily. ) 90 tablet 3  . canagliflozin (INVOKANA) 300 MG TABS tablet Take 300 mg by mouth daily before breakfast.    . carvedilol (COREG) 6.25 MG tablet TAKE 1 TABLET BY MOUTH TWO  TIMES DAILY WITH A MEAL 180 tablet 1  . glimepiride (AMARYL) 1 MG tablet Take 1 mg by mouth daily as needed.    . isosorbide mononitrate (IMDUR) 60 MG 24 hr tablet Take 1 tablet (60 mg total) by mouth daily. 90 tablet 3  . levothyroxine (SYNTHROID, LEVOTHROID) 125 MCG tablet Take 125 mcg by mouth daily.    Marland Kitchen losartan (COZAAR) 25 MG tablet TAKE 1 TABLET BY MOUTH  DAILY 90 tablet 1  . metFORMIN (GLUCOPHAGE) 500 MG tablet Take 500 mg by mouth 2 (two) times daily with a meal. Take one tablet in the morning and one tablet at night.    . Multiple Vitamin (MULTIVITAMIN) tablet Take 1 tablet by mouth daily.      . nitroGLYCERIN (NITROSTAT) 0.4 MG SL tablet Place 1 tablet (0.4 mg total) under the tongue every 5 (five) minutes as needed for chest pain. 25 tablet 2  . rosuvastatin (CRESTOR) 40 MG tablet Take 40 mg by mouth daily.    . vitamin C (ASCORBIC ACID) 500 MG tablet Take 500 mg by mouth daily.      Alveda Reasons 15 MG TABS tablet TAKE 1 TABLET BY MOUTH  DAILY WITH SUPPER 90 tablet 1   No current facility-administered medications for this visit.      Past  Medical History:  Diagnosis Date  . Atrial fibrillation ([place])    CHADS2Vasc is at least 5, on Xarelto  . CAD (coronary artery disease)   . cardiomyopathy    MDT ICD, Dr. Lovena Le 2009  . CHF (congestive heart failure) ([place])   . Diabetes mellitus   . Exogenous obesity   . Hyperlipidemia   . Hypertension   . Hypothyroidism   . MI, old [place], PTCA LAD per pt  . Sinus bradycardia     ROS:   All systems reviewed and negative except as noted in the HPI.   Past Surgical History:  Procedure Laterality Date  . ANGIOPLASTY    . CARDIAC CATHETERIZATION  03/13/1994   ACUTE MI WITH TOTAL OCCLUSION OF MID LAD. REPERFUSION AFTER CROSSING WITH A GUIDEWIRE. POOR RIGHT TO LEFT COLLATERAL FLOW. Pt states had PTCA  . CARDIAC DEFIBRILLATOR PLACEMENT    . CARDIOVASCULAR STRESS TEST  03/11/2008   EF 30%. NO REVERSIBLE ISCHEMIA  . EP IMPLANTABLE DEVICE N/A 09/28/2015   Procedure:  ICD Generator Changeout;  Surgeon: Evans Lance, MD;  Location: [facility];  Service: Cardiovascular;  Laterality: N/A;  . US ECHOCARDIOGRAPHY  04/26/2010   EF 35-40%. MODERATE LVH WITH MODERATE  GLOBAL LV SYSTOLIC DYSFUNCTION AND IMPAIRED RELAXATION. MILD AORTIC SCLEROSIS WITHOUT STENOSIS. NORMAL PULMONARY ARTERY PRESSURE.  Marland Kitchen US ECHOCARDIOGRAPHY  10/07/2004   EF 30-35%     Family History  Problem Relation Age of Onset  . Heart disease Mother   . Heart attack Mother      Social History   Socioeconomic History  . Marital status: Married    Spouse name: Not on file  . Number of children: Not on file  . Years of education: Not on file  . Highest education level: Not on file  Occupational History  . Occupation: Retired  Scientific laboratory technician  . Financial resource strain: Not on file  . Food insecurity:    Worry: Not on file    Inability: Not on file  . Transportation needs:    Medical: Not on file    Non-medical: Not on file  Tobacco Use  . Smoking status: Never Smoker  . Smokeless tobacco:  Never Used  Substance and Sexual Activity  . Alcohol use: No  . Drug use: No  . Sexual activity: Not on file  Lifestyle  . Physical activity:    Days per week: Not on file    Minutes per session: Not on file  . Stress: Not on file  Relationships  . Social connections:    Talks on phone: Not on file    Gets together: Not on file    Attends religious service: Not on file    Active member of club or organization: Not on file    Attends meetings of clubs or organizations: Not on file    Relationship status: Not on file  . Intimate partner violence:    Fear of current or ex partner: Not on file    Emotionally abused: Not on file    Physically abused: Not on file    Forced sexual activity: Not on file  Other Topics Concern  . Not on file  Social History Narrative   Lives with wife     BP (!) 100/56   Pulse 67   Ht 5\' 11"  (1.803 m)   Wt 198 lb 3.2 oz (89.9 kg)   SpO2 98%   BMI 27.64 kg/m   Physical Exam:  Well appearing 77 yo man, NAD HEENT: Unremarkable Neck:  No JVD, no thyromegally Lymphatics:  No adenopathy Back:  No CVA tenderness Lungs:  Clear with no wheezes HEART:  Regular rate rhythm, no murmurs, no rubs, no clicks Abd:  soft, positive bowel sounds, no organomegally, no rebound, no guarding Ext:  2 plus pulses, no edema, no cyanosis, no clubbing Skin:  No rashes no nodules Neuro:  CN II through XII intact, motor grossly intact   DEVICE  Normal device function.  See PaceArt for details.   Assess/Plan: 1. VT - he has had non episodes on amio. He will undergo watchful waiting.  2. Chronic systolic heart failure - his symptoms are class 2A. He will continue his current meds. 3. HTN - his blood pressure is well controlled. Will follow. 4. ICD - his Medtronic ICD is working normally. Will recheck in several months.   Mikle Bosworth.D.

## 2018-02-14 LAB — CUP PACEART INCLINIC DEVICE CHECK
Battery Remaining Longevity: 76 mo
Battery Voltage: 2.98 V
Brady Statistic AP VP Percent: 0.11 %
Brady Statistic AP VS Percent: 97.13 %
Brady Statistic AS VP Percent: 0 %
Brady Statistic AS VS Percent: 2.77 %
Brady Statistic RA Percent Paced: 97.03 %
Brady Statistic RV Percent Paced: 0.14 %
Date Time Interrogation Session: 20190611121958
HighPow Impedance: 43 Ohm
HighPow Impedance: 50 Ohm
Implantable Lead Implant Date: 20090908
Implantable Lead Implant Date: 20090908
Implantable Lead Location: 753859
Implantable Lead Location: 753860
Implantable Lead Model: 5076
Implantable Lead Model: 6947
Implantable Pulse Generator Implant Date: 20170123
Lead Channel Impedance Value: 399 Ohm
Lead Channel Impedance Value: 627 Ohm
Lead Channel Impedance Value: 665 Ohm
Lead Channel Pacing Threshold Amplitude: 0.75 V
Lead Channel Pacing Threshold Amplitude: 0.875 V
Lead Channel Pacing Threshold Pulse Width: 0.4 ms
Lead Channel Pacing Threshold Pulse Width: 0.4 ms
Lead Channel Sensing Intrinsic Amplitude: 13.5 mV
Lead Channel Sensing Intrinsic Amplitude: 15.75 mV
Lead Channel Sensing Intrinsic Amplitude: 3.875 mV
Lead Channel Sensing Intrinsic Amplitude: 3.875 mV
Lead Channel Setting Pacing Amplitude: 2 V
Lead Channel Setting Pacing Amplitude: 2.5 V
Lead Channel Setting Pacing Pulse Width: 0.4 ms
Lead Channel Setting Sensing Sensitivity: 0.3 mV

## 2018-03-07 ENCOUNTER — Ambulatory Visit (INDEPENDENT_AMBULATORY_CARE_PROVIDER_SITE_OTHER): Payer: Medicare Other | Admitting: *Deleted

## 2018-03-07 DIAGNOSIS — I255 Ischemic cardiomyopathy: Secondary | ICD-10-CM

## 2018-03-07 NOTE — Progress Notes (Signed)
Remote ICD transmission.   

## 2018-03-28 LAB — CUP PACEART REMOTE DEVICE CHECK
Battery Remaining Longevity: 76 mo
Brady Statistic AP VS Percent: 98.15 %
Brady Statistic RV Percent Paced: 0.15 %
HIGH POWER IMPEDANCE MEASURED VALUE: 53 Ohm
HighPow Impedance: 44 Ohm
Implantable Lead Implant Date: 20090908
Implantable Lead Location: 753860
Implantable Lead Model: 5076
Implantable Lead Model: 6947
Lead Channel Impedance Value: 399 Ohm
Lead Channel Pacing Threshold Amplitude: 1 V
Lead Channel Pacing Threshold Pulse Width: 0.4 ms
Lead Channel Pacing Threshold Pulse Width: 0.4 ms
Lead Channel Sensing Intrinsic Amplitude: 11.5 mV
Lead Channel Sensing Intrinsic Amplitude: 11.5 mV
Lead Channel Sensing Intrinsic Amplitude: 5.125 mV
Lead Channel Setting Pacing Amplitude: 2.5 V
Lead Channel Setting Pacing Pulse Width: 0.4 ms
MDC IDC LEAD IMPLANT DT: 20090908
MDC IDC LEAD LOCATION: 753859
MDC IDC MSMT BATTERY VOLTAGE: 2.95 V
MDC IDC MSMT LEADCHNL RA PACING THRESHOLD AMPLITUDE: 0.75 V
MDC IDC MSMT LEADCHNL RA SENSING INTR AMPL: 5.125 mV
MDC IDC MSMT LEADCHNL RV IMPEDANCE VALUE: 589 Ohm
MDC IDC MSMT LEADCHNL RV IMPEDANCE VALUE: 646 Ohm
MDC IDC PG IMPLANT DT: 20170123
MDC IDC SESS DTM: 20190703084223
MDC IDC SET LEADCHNL RA PACING AMPLITUDE: 2 V
MDC IDC SET LEADCHNL RV SENSING SENSITIVITY: 0.3 mV
MDC IDC STAT BRADY AP VP PERCENT: 0.11 %
MDC IDC STAT BRADY AS VP PERCENT: 0 %
MDC IDC STAT BRADY AS VS PERCENT: 1.74 %
MDC IDC STAT BRADY RA PERCENT PACED: 98.11 %

## 2018-05-22 ENCOUNTER — Telehealth: Payer: Self-pay

## 2018-05-22 NOTE — Telephone Encounter (Addendum)
   [facility] Pre-operative Risk Assessment    Request for surgical clearance:  1. What type of surgery is being performed? Endoscopy for Dysphagia  2. When is this surgery scheduled? 06/05/2018   3. What type of clearance is required (medical clearance vs. Pharmacy clearance to hold med vs. Both)? Pharmacy  4. Are there any medications that need to be held prior to surgery and how long? Xarelto,    5. Practice name and name of physician performing surgery? [facility], Dr. Michail Sermon  6. What is your office phone number [phone]   7.   What is your office fax number [phone]  8.   Anesthesia type (None, local, MAC, general) ? Not specified    Jacqulynn Cadet 05/22/2018, 9:59 AM  _________________________________________________________________   (provider comments below)

## 2018-05-22 NOTE — Telephone Encounter (Signed)
Pt takes Xarelto for afib with CHADS2VASc score of 6 (age x2, CHF, CAD, DM, HTN). CrCl is 16mL/min, pt appropriately on lower dose of Xarelto 15mg  daily. Recommend holding Xarelto for 1 day prior to procedure.

## 2018-05-22 NOTE — Telephone Encounter (Signed)
   Primary Cardiologist: [name], MD  Electrophysiologist: Dr. Lovena Le  Chart reviewed as part of pre-operative protocol coverage. Patient was contacted 05/22/2018 in reference to pre-operative risk assessment for pending surgery as outlined below.  Rodney Lee was last seen on 02/13/18 by Dr. Lovena Le.  Since that day, Rodney Lee has had some mild chest pain with moderate physical activity. He had a NST 01/2018 ordered by Dr. Sallyanne Kuster which showed extensive scar, with only very limited ischemia. Medical therapy elected and Imdur increased to 60 mg. Pt reports exertional CP walking up a flight of stairs, that improves with rest. No resting symptoms.   Due to symptoms, I will route to Dr. Sallyanne Kuster for clearance.   In the meantime, will route to pharmD to address anticoagulation.   Final Clearance pending  [name], PA-C 05/22/2018, 3:26 PM

## 2018-05-24 NOTE — Telephone Encounter (Signed)
   Primary Cardiologist:Mihai Croitoru, MD  Chart reviewed as part of pre-operative protocol coverage. Pre-op clearance already addressed by colleagues in earlier phone notes. To summarize recommendations:  -ok to proceed with endoscopy - Recommend holding Xarelto for 1 day prior to procedure.   Will route this bundled recommendation to requesting provider via Epic fax function. Please call with questions.  [name], PA-C 05/24/2018, 2:42 PM

## 2018-05-24 NOTE — Telephone Encounter (Signed)
Okay to proceed with endoscopy

## 2018-06-06 ENCOUNTER — Ambulatory Visit (INDEPENDENT_AMBULATORY_CARE_PROVIDER_SITE_OTHER): Payer: Medicare Other | Admitting: *Deleted

## 2018-06-06 DIAGNOSIS — I5022 Chronic systolic (congestive) heart failure: Secondary | ICD-10-CM

## 2018-06-06 DIAGNOSIS — I255 Ischemic cardiomyopathy: Secondary | ICD-10-CM

## 2018-06-06 NOTE — Progress Notes (Signed)
Remote ICD transmission.   

## 2018-06-12 LAB — CUP PACEART REMOTE DEVICE CHECK
Battery Voltage: 2.94 V
Brady Statistic AP VP Percent: 0.14 %
Brady Statistic RA Percent Paced: 97.78 %
Date Time Interrogation Session: 20191002071803
HIGH POWER IMPEDANCE MEASURED VALUE: 50 Ohm
HighPow Impedance: 40 Ohm
Implantable Lead Implant Date: 20090908
Implantable Lead Location: 753860
Implantable Pulse Generator Implant Date: 20170123
Lead Channel Impedance Value: 627 Ohm
Lead Channel Impedance Value: 665 Ohm
Lead Channel Sensing Intrinsic Amplitude: 17.25 mV
Lead Channel Sensing Intrinsic Amplitude: 17.25 mV
Lead Channel Sensing Intrinsic Amplitude: 5.375 mV
Lead Channel Setting Pacing Amplitude: 2.5 V
Lead Channel Setting Pacing Pulse Width: 0.4 ms
MDC IDC LEAD IMPLANT DT: 20090908
MDC IDC LEAD LOCATION: 753859
MDC IDC MSMT BATTERY REMAINING LONGEVITY: 70 mo
MDC IDC MSMT LEADCHNL RA IMPEDANCE VALUE: 380 Ohm
MDC IDC MSMT LEADCHNL RA PACING THRESHOLD AMPLITUDE: 0.625 V
MDC IDC MSMT LEADCHNL RA PACING THRESHOLD PULSEWIDTH: 0.4 ms
MDC IDC MSMT LEADCHNL RA SENSING INTR AMPL: 5.375 mV
MDC IDC MSMT LEADCHNL RV PACING THRESHOLD AMPLITUDE: 0.875 V
MDC IDC MSMT LEADCHNL RV PACING THRESHOLD PULSEWIDTH: 0.4 ms
MDC IDC SET LEADCHNL RA PACING AMPLITUDE: 2 V
MDC IDC SET LEADCHNL RV SENSING SENSITIVITY: 0.3 mV
MDC IDC STAT BRADY AP VS PERCENT: 97.81 %
MDC IDC STAT BRADY AS VP PERCENT: 0 %
MDC IDC STAT BRADY AS VS PERCENT: 2.05 %
MDC IDC STAT BRADY RV PERCENT PACED: 0.2 %

## 2018-06-17 NOTE — Progress Notes (Signed)
Cardiology Office Note    Date:  06/18/2018   ID:  Rodney Lee, DOB [birth year], MRN [id number]  PCP:  Gaynelle Arabian, MD  Cardiologist:   [name], MD   Chief Complaint  Patient presents with  . Chest Pain  . Congestive Heart Failure    History of Present Illness:  Rodney Lee is a 77 y.o. male , formerly patient of Dr. Mare Ferrari. Dr. Cristopher Peru follows his defibrillator.  He is here to follow-up for coronary artery disease and CHF.  He has a long-standing history of coronary artery disease, following a large anterolateral myocardial infarction in 1995 treated with angioplasty. He has not required repeat revascularization since that time. Ever since then he has had moderately-to-severely depressed left ventricular systolic function (EF 40-98% by his  echo in December 2016, but estimated at 40-45% by echo in June 2018, anteroapical scar), but has had very well compensated congestive heart failure.  He received a defibrillator for primary prevention in 2009 and underwent a generator change out in January 2017.  Sustained VT in 2017 as treated with overdrive pacing. In 2018 he had an appropriate defibrillator discharge for VT and he was started on amiodarone. Poorly tolerant of the higher loading dose of amiodarone, tolerates low dose. In the past, his device has detected asymptomatic paroxysmal atrial fibrillation. He takes anticoagulation with Xarelto and has not had bleeding problems. He has hyperlipidemia and diabetes mellitus, followed by Dr. Marisue Humble.  He has chronic kidney disease followed by Dr. Moshe Cipro, with recent worsening of creatinine to stage IV (creatinine 2.4) his anticoagulant dose has been adjusted for this.  He has a bland urinalysis.  Due to exertional angina, we performed a nuclear study in May 2019. It showed: "Large size (extent 58% of myocardium), severe intensity partially reversible (SDS 13) perfusion defects in multivessel territories, suggestive of  extensive scar with some peri-infarct ischemia. The lateral wall appears somewhat viable. LVEF 24% with a moderately dilated LV and severe inferior, septal, anterior and apical hypokinesis." Due to renal insufficiency, intensified medical therapy was prefered over angiography. On higher dose nitrates, he believes that the frequency of chest discomfort has diminished, but he still has it if he walks fast or if he lifts something heavy.  This happens only once or twice a week and his symptoms promptly resolve with rest.  He has not taken any sublingual nitroglycerin.  There has not been any pattern acceleration of his symptoms.  He denies exertional dyspnea and has not had edema.  The patient  denies any chest pain at rest, dyspnea at rest or with exertion, orthopnea, paroxysmal nocturnal dyspnea, syncope, palpitations, focal neurological deficits, intermittent claudication, lower extremity edema, unexplained weight gain, cough, hemoptysis or wheezing.  He has very easy bruising on his forearms where the skin is very fragile.    Past Medical History:  Diagnosis Date  . Atrial fibrillation ([place])    CHADS2Vasc is at least 5, on Xarelto  . CAD (coronary artery disease)   . cardiomyopathy    MDT ICD, Dr. Lovena Le 2009  . CHF (congestive heart failure) ([place])   . Diabetes mellitus   . Exogenous obesity   . Hyperlipidemia   . Hypertension   . Hypothyroidism   . MI, old [place], PTCA LAD per pt  . Sinus bradycardia     Past Surgical History:  Procedure Laterality Date  . ANGIOPLASTY    . CARDIAC CATHETERIZATION  03/13/1994   ACUTE MI WITH TOTAL  OCCLUSION OF MID LAD. REPERFUSION AFTER CROSSING WITH A GUIDEWIRE. POOR RIGHT TO LEFT COLLATERAL FLOW. Pt states had PTCA  . CARDIAC DEFIBRILLATOR PLACEMENT    . CARDIOVASCULAR STRESS TEST  03/11/2008   EF 30%. NO REVERSIBLE ISCHEMIA  . EP IMPLANTABLE DEVICE N/A 09/28/2015   Procedure:  ICD Generator Changeout;  Surgeon: Evans Lance, MD;   Location: [facility];  Service: Cardiovascular;  Laterality: N/A;  . US ECHOCARDIOGRAPHY  04/26/2010   EF 35-40%. MODERATE LVH WITH MODERATE GLOBAL LV SYSTOLIC DYSFUNCTION AND IMPAIRED RELAXATION. MILD AORTIC SCLEROSIS WITHOUT STENOSIS. NORMAL PULMONARY ARTERY PRESSURE.  Marland Kitchen US ECHOCARDIOGRAPHY  10/07/2004   EF 30-35%    Current Medications: Outpatient Medications Prior to Visit  Medication Sig Dispense Refill  . amiodarone (PACERONE) 200 MG tablet Take 200 mg by mouth 2 (two) times daily.    . canagliflozin (INVOKANA) 300 MG TABS tablet Take 300 mg by mouth daily before breakfast.    . carvedilol (COREG) 6.25 MG tablet TAKE 1 TABLET BY MOUTH TWO  TIMES DAILY WITH A MEAL 180 tablet 1  . glimepiride (AMARYL) 1 MG tablet Take 1 mg by mouth daily as needed.    . isosorbide mononitrate (IMDUR) 60 MG 24 hr tablet Take 1 tablet (60 mg total) by mouth daily. 90 tablet 3  . levothyroxine (SYNTHROID, LEVOTHROID) 112 MCG tablet     . Multiple Vitamin (MULTIVITAMIN) tablet Take 1 tablet by mouth daily.      . nitroGLYCERIN (NITROSTAT) 0.4 MG SL tablet Place 1 tablet (0.4 mg total) under the tongue every 5 (five) minutes as needed for chest pain. 25 tablet 2  . rosuvastatin (CRESTOR) 40 MG tablet Take 40 mg by mouth daily.    . vitamin C (ASCORBIC ACID) 500 MG tablet Take 500 mg by mouth daily.      Alveda Reasons 15 MG TABS tablet TAKE 1 TABLET BY MOUTH  DAILY WITH SUPPER 90 tablet 1  . amiodarone (PACERONE) 200 MG tablet Take 1 tablet (200 mg total) by mouth daily. (Patient taking differently: Take 200 mg by mouth 2 (two) times daily. ) 90 tablet 3  . levothyroxine (SYNTHROID, LEVOTHROID) 125 MCG tablet Take 125 mcg by mouth daily.    Marland Kitchen losartan (COZAAR) 25 MG tablet TAKE 1 TABLET BY MOUTH  DAILY (Patient not taking: Reported on 06/18/2018) 90 tablet 1  . metFORMIN (GLUCOPHAGE) 500 MG tablet Take 500 mg by mouth 2 (two) times daily with a meal. Take one tablet in the morning and one tablet at night.      No facility-administered medications prior to visit.      Allergies:   Lipitor [atorvastatin calcium]   Social History   Socioeconomic History  . Marital status: Married    Spouse name: Not on file  . Number of children: Not on file  . Years of education: Not on file  . Highest education level: Not on file  Occupational History  . Occupation: Retired  Scientific laboratory technician  . Financial resource strain: Not on file  . Food insecurity:    Worry: Not on file    Inability: Not on file  . Transportation needs:    Medical: Not on file    Non-medical: Not on file  Tobacco Use  . Smoking status: Never Smoker  . Smokeless tobacco: Never Used  Substance and Sexual Activity  . Alcohol use: No  . Drug use: No  . Sexual activity: Not on file  Lifestyle  . Physical activity:  Days per week: Not on file    Minutes per session: Not on file  . Stress: Not on file  Relationships  . Social connections:    Talks on phone: Not on file    Gets together: Not on file    Attends religious service: Not on file    Active member of club or organization: Not on file    Attends meetings of clubs or organizations: Not on file    Relationship status: Not on file  Other Topics Concern  . Not on file  Social History Narrative   Lives with wife     Family History:  The patient's family history includes Heart attack in his mother; Heart disease in his mother.   ROS:   Please see the history of present illness.    ROS All other systems were reviewed and negative   PHYSICAL EXAM:   VS:  BP 110/66   Pulse 65   Ht 5' 10.5" (1.791 m)   Wt 198 lb 9.6 oz (90.1 kg)   BMI 28.09 kg/m      General: Alert, oriented x3, no distress, healthy ICD site Head: no evidence of trauma, PERRL, EOMI, no exophtalmos or lid lag, no myxedema, no xanthelasma; normal ears, nose and oropharynx Neck: normal jugular venous pulsations and no hepatojugular reflux; brisk carotid pulses without delay and no carotid  bruits Chest: clear to auscultation, no signs of consolidation by percussion or palpation, normal fremitus, symmetrical and full respiratory excursions Cardiovascular: normal position and quality of the apical impulse, regular rhythm, normal first and paradoxically split second heart sounds, no murmurs, rubs or gallops Abdomen: no tenderness or distention, no masses by palpation, no abnormal pulsatility or arterial bruits, normal bowel sounds, no hepatosplenomegaly Extremities: no clubbing, cyanosis or edema; 2+ radial, ulnar and brachial pulses bilaterally; 2+ right femoral, posterior tibial and dorsalis pedis pulses; 2+ left femoral, posterior tibial and dorsalis pedis pulses; no subclavian or femoral bruits Neurological: grossly nonfocal Psych: Normal mood and affect    Wt Readings from Last 3 Encounters:  06/18/18 198 lb 9.6 oz (90.1 kg)  02/13/18 198 lb 3.2 oz (89.9 kg)  01/16/18 199 lb (90.3 kg)      Studies/Labs Reviewed:   EKG:  EKG is ordered today.  The ekg ordered today demonstrates atrial paced, ventricular sensed rhythm with long AV delay 278 ms, left bundle branch block (QRS 176 ms), QTC 424ms   Recent Labs: No results found for requested labs within last 8760 hours.   Lipid Panel    Component Value Date/Time   CHOL 242 (H) 06/20/2011 1039   TRIG 288.0 (H) 06/20/2011 1039   HDL 33.10 (L) 06/20/2011 1039   CHOLHDL 7 06/20/2011 1039   VLDL 57.6 (H) 06/20/2011 1039   LDLDIRECT 159.8 06/20/2011 1039    August 30, 2017 labs total cholesterol 120, HDL 40, LDL 57, triglycerides 109 TSH 0.55, creatinine 2.43, potassium 4.4, hemoglobin A1c 7.3%, normal liver function tests  Additional studies/ records that were reviewed today include:  Records from last office visit June 2019 with Dr. Lovena Le,  remote device check 06/06/2018.  ASSESSMENT:    1. Ventricular tachycardia (paroxysmal) ([place])   2. Chronic systolic CHF (congestive heart failure) ([place])   3. PAF (paroxysmal  atrial fibrillation) ([place])   4. Coronary artery disease involving native coronary artery of native heart with angina pectoris ([place])   5. LBBB (left bundle branch block)   6. Automatic implantable cardioverter-defibrillator in situ   7. Dyslipidemia  8. Diabetes mellitus due to underlying condition with stage 3 chronic kidney disease, without long-term current use of insulin ([facility])   9. Stage 3 chronic kidney disease ([place])      PLAN:  In order of problems listed above:  1. VT: Most recent treatment from his defibrillator was in June 2018 and was appropriate for VT/VF, even though he was not symptomatic.  His device has not recorded even nonsustained VT since his last with Dr. Lovena Le he is on amiodarone and should have liver function tests and thyroid tests checked every 6 months. 2. CHF: Appears clinically euvolemic and has excellent functional status without loop diuretics, possibly helped by the fact that he takes an SGL T-2 inhibitor.  He does not take RAAS inhibitors due to his advanced kidney disease. 3. AFib: Asymptomatic atrial fibrillation has been previously detected by his device, but none since his last visit.. On appropriate anticoagulation for CHADSVasc 5 (age 30, CAD, CHF, DM). No history of known stroke or TIA. 4. CAD: Continues to have CCS class II exertional angina even after the increase in the dose of nitrates, but this is not interfering with daily activity.  Famotidine may also be have symptoms chest symptoms, but the exertional pattern is consistent with cardiac etiology.  We reviewed again the potential benefits of angiography, but also the risk for contrast nephrotoxicity.  He prefers conservative management the best choice.. 5. LBBB: Option for upgrade of his device to CRT-D if he should develop significant heart failure.  His nuclear scintigram showed an EF of 24%, although the last echo report from year to 40%.  His CPAP to be reassessed for making that decision.  As long as  he has class I status, not likely to be an issue anytime soon. 6. ICD: Followed in EP clinic by Dr. Lovena Le.  Last remote check a just about a week ago 7. HLP: On statin, with target LDL less than 70.  Labs with PCP, Dr. Marisue Humble. 8. DM: followed by Dr. Marisue Humble. On SGLT2 inhibitor., will have to monitor closely as his kidney function deteriorates. 9. CKD IV: Followed by Dr. Moshe Cipro, recently seems to have progressed to CKD stage IV.    Medication Adjustments/Labs and Tests Ordered: Current medicines are reviewed at length with the patient today.  Concerns regarding medicines are outlined above.  Medication changes, Labs and Tests ordered today are listed in the Patient Instructions below. Patient Instructions  Medication Instructions:  Dr Sallyanne Kuster recommends that you continue on your current medications as directed. Please refer to the Current Medication list given to you today.  If you need a refill on your cardiac medications before your next appointment, please call your pharmacy.   Lab work: Your physician recommends that you return for lab work at your convenience.  If you have labs (blood work) drawn today and your tests are completely normal, you will receive your results only by: Marland Kitchen MyChart Message (if you have MyChart) OR . A paper copy in the mail If you have any lab test that is abnormal or we need to change your treatment, we will call you to review the results.  Testing/Procedures: NONE ORDERED  Follow-Up: At [facility], you and your health needs are our priority.  As part of our continuing mission to provide you with exceptional heart care, we have created designated Provider Care Teams.  These Care Teams include your primary Cardiologist (physician) and Advanced Practice Providers (APPs -  Physician Assistants and Nurse Practitioners) who all work  together to provide you with the care you need, when you need it. You will need a follow up appointment in 6 months.   Please call our office 2 months in advance to schedule this appointment.  You may see [name], MD or one of the following Advanced Practice Providers on your designated Care Team: [place], [place] . Fabian Sharp, PA-C    Signed, [name], MD  06/18/2018 8:25 AM    [facility] [place], [place], [place]  [address] Phone: [phone]; Fax: [phone]

## 2018-06-18 ENCOUNTER — Encounter: Payer: Self-pay | Admitting: Cardiovascular Disease

## 2018-06-18 ENCOUNTER — Ambulatory Visit: Payer: Medicare Other | Admitting: Cardiovascular Disease

## 2018-06-18 VITALS — BP 110/66 | HR 65 | Ht 70.5 in | Wt 198.6 lb

## 2018-06-18 DIAGNOSIS — I480 Paroxysmal atrial fibrillation: Secondary | ICD-10-CM | POA: Diagnosis not present

## 2018-06-18 DIAGNOSIS — I472 Ventricular tachycardia: Secondary | ICD-10-CM

## 2018-06-18 DIAGNOSIS — N183 Chronic kidney disease, stage 3 unspecified: Secondary | ICD-10-CM

## 2018-06-18 DIAGNOSIS — E785 Hyperlipidemia, unspecified: Secondary | ICD-10-CM

## 2018-06-18 DIAGNOSIS — Z95810 Presence of automatic (implantable) cardiac defibrillator: Secondary | ICD-10-CM

## 2018-06-18 DIAGNOSIS — I4729 Other ventricular tachycardia: Secondary | ICD-10-CM

## 2018-06-18 DIAGNOSIS — E0822 Diabetes mellitus due to underlying condition with diabetic chronic kidney disease: Secondary | ICD-10-CM

## 2018-06-18 DIAGNOSIS — I447 Left bundle-branch block, unspecified: Secondary | ICD-10-CM

## 2018-06-18 DIAGNOSIS — I25119 Atherosclerotic heart disease of native coronary artery with unspecified angina pectoris: Secondary | ICD-10-CM

## 2018-06-18 DIAGNOSIS — I5022 Chronic systolic (congestive) heart failure: Secondary | ICD-10-CM

## 2018-06-18 NOTE — Patient Instructions (Addendum)
Medication Instructions:  Dr Sallyanne Kuster recommends that you continue on your current medications as directed. Please refer to the Current Medication list given to you today.  If you need a refill on your cardiac medications before your next appointment, please call your pharmacy.   Lab work: Your physician recommends that you return for lab work at your convenience.  If you have labs (blood work) drawn today and your tests are completely normal, you will receive your results only by: Marland Kitchen MyChart Message (if you have MyChart) OR . A paper copy in the mail If you have any lab test that is abnormal or we need to change your treatment, we will call you to review the results.  Testing/Procedures: NONE ORDERED  Follow-Up: At [facility], you and your health needs are our priority.  As part of our continuing mission to provide you with exceptional heart care, we have created designated Provider Care Teams.  These Care Teams include your primary Cardiologist (physician) and Advanced Practice Providers (APPs -  Physician Assistants and Nurse Practitioners) who all work together to provide you with the care you need, when you need it. You will need a follow up appointment in 6 months.  Please call our office 2 months in advance to schedule this appointment.  You may see [name], MD or one of the following Advanced Practice Providers on your designated Care Team: [place], [place] . Fabian Sharp, PA-C

## 2018-07-14 ENCOUNTER — Other Ambulatory Visit: Payer: Self-pay | Admitting: Cardiovascular Disease

## 2018-07-16 NOTE — Telephone Encounter (Signed)
Rx(s) sent to pharmacy electronically.  

## 2018-07-28 ENCOUNTER — Other Ambulatory Visit: Payer: Self-pay | Admitting: Internal Medicine

## 2018-09-06 ENCOUNTER — Ambulatory Visit (INDEPENDENT_AMBULATORY_CARE_PROVIDER_SITE_OTHER): Payer: Medicare Other

## 2018-09-06 DIAGNOSIS — I255 Ischemic cardiomyopathy: Secondary | ICD-10-CM | POA: Diagnosis not present

## 2018-09-06 LAB — CUP PACEART REMOTE DEVICE CHECK
Battery Voltage: 2.94 V
Brady Statistic AP VP Percent: 0.13 %
Brady Statistic AS VP Percent: 0 %
Brady Statistic AS VS Percent: 2.78 %
Brady Statistic RA Percent Paced: 96.76 %
HighPow Impedance: 41 Ohm
HighPow Impedance: 52 Ohm
Implantable Lead Implant Date: 20090908
Implantable Lead Location: 753860
Implantable Lead Model: 6947
Implantable Pulse Generator Implant Date: 20170123
Lead Channel Impedance Value: 570 Ohm
Lead Channel Impedance Value: 703 Ohm
Lead Channel Pacing Threshold Amplitude: 0.625 V
Lead Channel Sensing Intrinsic Amplitude: 15.625 mV
Lead Channel Sensing Intrinsic Amplitude: 3.75 mV
Lead Channel Sensing Intrinsic Amplitude: 3.75 mV
Lead Channel Setting Pacing Pulse Width: 0.4 ms
Lead Channel Setting Sensing Sensitivity: 0.3 mV
MDC IDC LEAD IMPLANT DT: 20090908
MDC IDC LEAD LOCATION: 753859
MDC IDC MSMT BATTERY REMAINING LONGEVITY: 68 mo
MDC IDC MSMT LEADCHNL RA IMPEDANCE VALUE: 399 Ohm
MDC IDC MSMT LEADCHNL RA PACING THRESHOLD PULSEWIDTH: 0.4 ms
MDC IDC MSMT LEADCHNL RV PACING THRESHOLD AMPLITUDE: 0.875 V
MDC IDC MSMT LEADCHNL RV PACING THRESHOLD PULSEWIDTH: 0.4 ms
MDC IDC MSMT LEADCHNL RV SENSING INTR AMPL: 15.625 mV
MDC IDC SESS DTM: 20200102072603
MDC IDC SET LEADCHNL RA PACING AMPLITUDE: 2 V
MDC IDC SET LEADCHNL RV PACING AMPLITUDE: 2.5 V
MDC IDC STAT BRADY AP VS PERCENT: 97.09 %
MDC IDC STAT BRADY RV PERCENT PACED: 0.18 %

## 2018-09-06 NOTE — Progress Notes (Signed)
Remote ICD transmission.   

## 2018-09-13 ENCOUNTER — Other Ambulatory Visit: Payer: Self-pay | Admitting: Cardiovascular Disease

## 2018-09-13 NOTE — Telephone Encounter (Signed)
Calculated CRCl = 32 ml/min

## 2018-12-06 ENCOUNTER — Ambulatory Visit (INDEPENDENT_AMBULATORY_CARE_PROVIDER_SITE_OTHER): Payer: Medicare Other | Admitting: *Deleted

## 2018-12-06 ENCOUNTER — Other Ambulatory Visit: Payer: Self-pay

## 2018-12-06 DIAGNOSIS — I255 Ischemic cardiomyopathy: Secondary | ICD-10-CM

## 2018-12-06 LAB — CUP PACEART REMOTE DEVICE CHECK
Battery Remaining Longevity: 64 mo
Battery Voltage: 2.93 V
Brady Statistic AP VP Percent: 0.14 %
Brady Statistic AP VS Percent: 96.99 %
Brady Statistic AS VP Percent: 0 %
Brady Statistic AS VS Percent: 2.87 %
Brady Statistic RA Percent Paced: 96.89 %
Brady Statistic RV Percent Paced: 0.19 %
Date Time Interrogation Session: 20200402062503
HighPow Impedance: 43 Ohm
HighPow Impedance: 55 Ohm
Implantable Lead Implant Date: 20090908
Implantable Lead Implant Date: 20090908
Implantable Lead Location: 753859
Implantable Lead Location: 753860
Implantable Lead Model: 5076
Implantable Lead Model: 6947
Implantable Pulse Generator Implant Date: 20170123
Lead Channel Impedance Value: 399 Ohm
Lead Channel Impedance Value: 627 Ohm
Lead Channel Impedance Value: 703 Ohm
Lead Channel Pacing Threshold Amplitude: 0.625 V
Lead Channel Pacing Threshold Amplitude: 0.875 V
Lead Channel Pacing Threshold Pulse Width: 0.4 ms
Lead Channel Pacing Threshold Pulse Width: 0.4 ms
Lead Channel Sensing Intrinsic Amplitude: 15.25 mV
Lead Channel Sensing Intrinsic Amplitude: 15.25 mV
Lead Channel Sensing Intrinsic Amplitude: 2 mV
Lead Channel Sensing Intrinsic Amplitude: 2 mV
Lead Channel Setting Pacing Amplitude: 2 V
Lead Channel Setting Pacing Amplitude: 2.5 V
Lead Channel Setting Pacing Pulse Width: 0.4 ms
Lead Channel Setting Sensing Sensitivity: 0.3 mV

## 2018-12-14 ENCOUNTER — Encounter: Payer: Self-pay | Admitting: Cardiology

## 2018-12-14 NOTE — Progress Notes (Signed)
Remote ICD transmission.   

## 2018-12-20 ENCOUNTER — Telehealth: Payer: Self-pay | Admitting: Cardiovascular Disease

## 2018-12-20 NOTE — Telephone Encounter (Signed)
PT will call as soon as virus is over.

## 2019-01-26 ENCOUNTER — Other Ambulatory Visit: Payer: Self-pay | Admitting: Cardiovascular Disease

## 2019-01-30 ENCOUNTER — Telehealth: Payer: Self-pay | Admitting: Internal Medicine

## 2019-01-30 NOTE — Telephone Encounter (Signed)
Patient walked in lobby stating he is feeling weak - thinks its his defibullator- called office earlier left message

## 2019-01-30 NOTE — Telephone Encounter (Signed)
I help the pt send a manual transmission with his home monitor per [facility] request. Transmission received. I told the pt that Raquel Sarna will give him a call back.

## 2019-01-30 NOTE — Telephone Encounter (Signed)
Spoke with patient. He is feeling OK currently. Transmission reviewed and unremarkable. HF diagnostics unremarkable, with thoracic impedence trending UP.  He is relieved nothing is "going on with his heart". Pt urged to follow up with his PCP and he has several other co-morbidities that could be causing his fatigue. Pt verbalized understanding.    Legrand Como [address]" [place], PA-C 01/30/2019 1:35 PM

## 2019-01-30 NOTE — Telephone Encounter (Signed)
New Message             Patient is calling in to discuss his defibrillator he thinks in needs to be adjusted, he states he is very weak and was not sure if he was going to wake up or not today. Pls call to advise.

## 2019-02-16 ENCOUNTER — Other Ambulatory Visit: Payer: Self-pay | Admitting: Cardiovascular Disease

## 2019-02-18 NOTE — Telephone Encounter (Signed)
Called and lmomed the pt stating that in order to further refill labs will be needed and awaiting a call back

## 2019-02-18 NOTE — Telephone Encounter (Signed)
Pt is a 77yom wt of 90.1kg, scr of ...(needs creatinine done because nothing in kpn) lov w/Dr. Sallyanne Kuster (06/18/18) Pt requesting 15mg  xarelto will call pt to see about getting labs

## 2019-02-18 NOTE — Telephone Encounter (Signed)
KPN shows SCr 3.45 in June 2020.  Based on that CrCl 17.6

## 2019-02-25 ENCOUNTER — Telehealth: Payer: Self-pay | Admitting: Cardiovascular Disease

## 2019-02-25 MED ORDER — CANAGLIFLOZIN 300 MG PO TABS: 300.0000 mg | ORAL_TABLET | Freq: Every day | ORAL | 0 refills | Status: DC

## 2019-02-25 NOTE — Telephone Encounter (Signed)
Called patient and let him know that Dr. Loletha Grayer said we could refill Invokana this one time but the next time his PCP would have to refill that medication for him.

## 2019-02-25 NOTE — Telephone Encounter (Signed)
OK to refill (although I thought Dr. Sandi Mariscal usually does that) [facility]

## 2019-02-25 NOTE — Telephone Encounter (Signed)
New Message     *STAT* If patient is at the pharmacy, call can be transferred to refill team.   1. Which medications need to be refilled? (please list name of each medication and dose if known) Canagliflozin 300mg  1 tablet before breakfast   2. Which pharmacy/location (including street and city if local pharmacy) is medication to be sent to? [facility] mail [facility]  3. Do they need a 30 day or 90 day supply? 90 day supply

## 2019-03-07 ENCOUNTER — Ambulatory Visit (INDEPENDENT_AMBULATORY_CARE_PROVIDER_SITE_OTHER): Payer: Medicare Other | Admitting: *Deleted

## 2019-03-07 DIAGNOSIS — I255 Ischemic cardiomyopathy: Secondary | ICD-10-CM

## 2019-03-07 LAB — CUP PACEART REMOTE DEVICE CHECK
Battery Remaining Longevity: 59 mo
Battery Voltage: 2.91 V
Brady Statistic AP VP Percent: 0.16 %
Brady Statistic AP VS Percent: 96.85 %
Brady Statistic AS VP Percent: 0 %
Brady Statistic AS VS Percent: 2.99 %
Brady Statistic RA Percent Paced: 96.2 %
Brady Statistic RV Percent Paced: 0.2 %
Date Time Interrogation Session: 20200702071805
HighPow Impedance: 43 Ohm
HighPow Impedance: 52 Ohm
Implantable Lead Implant Date: 20090908
Implantable Lead Implant Date: 20090908
Implantable Lead Location: 753859
Implantable Lead Location: 753860
Implantable Lead Model: 5076
Implantable Lead Model: 6947
Implantable Pulse Generator Implant Date: 20170123
Lead Channel Impedance Value: 399 Ohm
Lead Channel Impedance Value: 627 Ohm
Lead Channel Impedance Value: 665 Ohm
Lead Channel Pacing Threshold Amplitude: 0.625 V
Lead Channel Pacing Threshold Amplitude: 0.875 V
Lead Channel Pacing Threshold Pulse Width: 0.4 ms
Lead Channel Pacing Threshold Pulse Width: 0.4 ms
Lead Channel Sensing Intrinsic Amplitude: 14.375 mV
Lead Channel Sensing Intrinsic Amplitude: 4 mV
Lead Channel Setting Pacing Amplitude: 2 V
Lead Channel Setting Pacing Amplitude: 2.5 V
Lead Channel Setting Pacing Pulse Width: 0.4 ms
Lead Channel Setting Sensing Sensitivity: 0.3 mV

## 2019-03-15 ENCOUNTER — Encounter: Payer: Self-pay | Admitting: Cardiology

## 2019-03-15 NOTE — Progress Notes (Signed)
Remote ICD transmission.   

## 2019-04-15 ENCOUNTER — Other Ambulatory Visit: Payer: Self-pay | Admitting: Cardiovascular Disease

## 2019-04-15 NOTE — Telephone Encounter (Signed)
Please advise if ok to refill Invokana 300 mg tablet qd.

## 2019-06-06 ENCOUNTER — Ambulatory Visit (INDEPENDENT_AMBULATORY_CARE_PROVIDER_SITE_OTHER): Payer: Medicare Other | Admitting: *Deleted

## 2019-06-06 DIAGNOSIS — I255 Ischemic cardiomyopathy: Secondary | ICD-10-CM | POA: Diagnosis not present

## 2019-06-08 LAB — CUP PACEART REMOTE DEVICE CHECK
Battery Remaining Longevity: 54 mo
Battery Voltage: 2.91 V
Brady Statistic AP VP Percent: 0.14 %
Brady Statistic AP VS Percent: 96.58 %
Brady Statistic AS VP Percent: 0 %
Brady Statistic AS VS Percent: 3.27 %
Brady Statistic RA Percent Paced: 95.64 %
Brady Statistic RV Percent Paced: 0.18 %
Date Time Interrogation Session: 20201002082309
HighPow Impedance: 39 Ohm
HighPow Impedance: 48 Ohm
Implantable Lead Implant Date: 20090908
Implantable Lead Implant Date: 20090908
Implantable Lead Location: 753859
Implantable Lead Location: 753860
Implantable Lead Model: 5076
Implantable Lead Model: 6947
Implantable Pulse Generator Implant Date: 20170123
Lead Channel Impedance Value: 342 Ohm
Lead Channel Impedance Value: 513 Ohm
Lead Channel Impedance Value: 570 Ohm
Lead Channel Pacing Threshold Amplitude: 0.625 V
Lead Channel Pacing Threshold Amplitude: 0.875 V
Lead Channel Pacing Threshold Pulse Width: 0.4 ms
Lead Channel Pacing Threshold Pulse Width: 0.4 ms
Lead Channel Sensing Intrinsic Amplitude: 16 mV
Lead Channel Sensing Intrinsic Amplitude: 16 mV
Lead Channel Sensing Intrinsic Amplitude: 3.5 mV
Lead Channel Sensing Intrinsic Amplitude: 3.5 mV
Lead Channel Setting Pacing Amplitude: 2 V
Lead Channel Setting Pacing Amplitude: 2.5 V
Lead Channel Setting Pacing Pulse Width: 0.4 ms
Lead Channel Setting Sensing Sensitivity: 0.3 mV

## 2019-06-13 ENCOUNTER — Encounter: Payer: Self-pay | Admitting: Cardiology

## 2019-06-13 NOTE — Progress Notes (Signed)
Remote ICD transmission.   

## 2019-06-18 ENCOUNTER — Other Ambulatory Visit: Payer: Self-pay | Admitting: Internal Medicine

## 2019-07-09 ENCOUNTER — Other Ambulatory Visit: Payer: Self-pay | Admitting: Internal Medicine

## 2019-07-16 ENCOUNTER — Other Ambulatory Visit: Payer: Self-pay | Admitting: Cardiovascular Disease

## 2019-08-16 ENCOUNTER — Encounter: Payer: Self-pay | Admitting: Cardiovascular Disease

## 2019-08-16 ENCOUNTER — Ambulatory Visit (INDEPENDENT_AMBULATORY_CARE_PROVIDER_SITE_OTHER): Payer: Medicare Other | Admitting: Cardiovascular Disease

## 2019-08-16 VITALS — BP 112/67 | HR 65 | Temp 97.1°F | Ht 70.5 in | Wt 199.8 lb

## 2019-08-16 DIAGNOSIS — E785 Hyperlipidemia, unspecified: Secondary | ICD-10-CM | POA: Diagnosis not present

## 2019-08-16 DIAGNOSIS — Z5181 Encounter for therapeutic drug level monitoring: Secondary | ICD-10-CM

## 2019-08-16 DIAGNOSIS — I480 Paroxysmal atrial fibrillation: Secondary | ICD-10-CM

## 2019-08-16 DIAGNOSIS — N184 Chronic kidney disease, stage 4 (severe): Secondary | ICD-10-CM

## 2019-08-16 DIAGNOSIS — I5042 Chronic combined systolic (congestive) and diastolic (congestive) heart failure: Secondary | ICD-10-CM

## 2019-08-16 DIAGNOSIS — IMO0002 Reserved for concepts with insufficient information to code with codable children: Secondary | ICD-10-CM

## 2019-08-16 DIAGNOSIS — I25118 Atherosclerotic heart disease of native coronary artery with other forms of angina pectoris: Secondary | ICD-10-CM | POA: Diagnosis not present

## 2019-08-16 DIAGNOSIS — I447 Left bundle-branch block, unspecified: Secondary | ICD-10-CM

## 2019-08-16 DIAGNOSIS — I472 Ventricular tachycardia, unspecified: Secondary | ICD-10-CM

## 2019-08-16 DIAGNOSIS — Z95810 Presence of automatic (implantable) cardiac defibrillator: Secondary | ICD-10-CM

## 2019-08-16 DIAGNOSIS — E1322 Other specified diabetes mellitus with diabetic chronic kidney disease: Secondary | ICD-10-CM

## 2019-08-16 DIAGNOSIS — E1365 Other specified diabetes mellitus with hyperglycemia: Secondary | ICD-10-CM

## 2019-08-16 DIAGNOSIS — Z79899 Other long term (current) drug therapy: Secondary | ICD-10-CM

## 2019-08-16 MED ORDER — ISOSORBIDE MONONITRATE ER 60 MG PO TB24: 90.0000 mg | ORAL_TABLET | Freq: Every day | ORAL | 3 refills | Status: DC

## 2019-08-16 NOTE — Patient Instructions (Signed)
Medication Instructions:  INCREASE the Isosorbide to 90 mg once daily (a tablet and a half)  *If you need a refill on your cardiac medications before your next appointment, please call your pharmacy*  Lab Work: Your provider would like for you to have the following labs today: CMET and Liver  If you have labs (blood work) drawn today and your tests are completely normal, you will receive your results only by: Marland Kitchen MyChart Message (if you have MyChart) OR . A paper copy in the mail If you have any lab test that is abnormal or we need to change your treatment, we will call you to review the results.  Testing/Procedures: None ordered  Follow-Up: At [facility], you and your health needs are our priority.  As part of our continuing mission to provide you with exceptional heart care, we have created designated Provider Care Teams.  These Care Teams include your primary Cardiologist (physician) and Advanced Practice Providers (APPs -  Physician Assistants and Nurse Practitioners) who all work together to provide you with the care you need, when you need it.  Your next appointment:   12 month(s)  The format for your next appointment:   In Person  Provider:   [name], MD

## 2019-08-16 NOTE — Progress Notes (Signed)
Cardiology Office Note    Date:  08/16/2019   ID:  Rodney Lee, DOB [birth year], MRN [id number]  PCP:  Gaynelle Arabian, MD  Cardiologist:   [name], MD   Chief Complaint  Patient presents with  . Congestive Heart Failure  . Coronary Artery Disease    History of Present Illness:  Rodney Lee is a 78 y.o. male , formerly patient of Dr. Mare Ferrari. Dr. Cristopher Peru follows his defibrillator.  He is here to follow-up for coronary artery disease and CHF.  He has done very well since his last appointment.  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  He has predictable chest discomfort with activity when he rushes or lifts a heavier than usual loads.  The symptoms resolved within a minute when he stops.  He never has chest discomfort at rest.  He sometimes feels tired at the end of the day but he denies exertional dyspnea.  His Invokana was stopped due to worsening renal function.  He continues to take amiodarone and his defibrillator has not detected any meaningful atrial or ventricular arrhythmia since his last appointment.  He is compliant with his Xarelto anticoagulation (dose adjusted for renal function) and has not had any falls, injuries or bleeding.  He denies orthopnea, PND, leg edema, chest discomfort at rest or with activity, palpitations or syncope, intermittent claudication or focal neurological events.  Interrogation of his device today shows normal defibrillator function.  His Medtronic Evera was implanted in 2017 and still has over 4 years of estimated longevity.  Activity has been steady at about 2-1/2 hours of day, heart rate histogram distribution is excellent.  He has 97% atrial pacing and never requires ventricular pacing.  He has not had any sustained or nonsustained VT and has never had atrial fibrillation.  OptiVol is in normal range and has not deviated in the last 12 months.  His nephrologist  is Dr. Clover Mealy and his most recent creatinine was 2.56 on June 17, 2019  He has a long-standing history of coronary artery disease, following a large anterolateral myocardial infarction in 1995 treated with angioplasty. He has not required repeat revascularization since that time. Ever since then he has had moderately-to-severely depressed left ventricular systolic function (EF 31-51% by his  echo in December 2016, but estimated at 40-45% by echo in June 2018, anteroapical scar), but has had very well compensated congestive heart failure.  He received a defibrillator for primary prevention in 2009 and underwent a generator change out in January 2017.  Sustained VT in 2017 as treated with overdrive pacing. In 2018 he had an appropriate defibrillator discharge for VT and he was started on amiodarone. Poorly tolerant of the higher loading dose of amiodarone, tolerates low dose. In the past, his device has detected asymptomatic paroxysmal atrial fibrillation. He takes anticoagulation with Xarelto and has not had bleeding problems. He has hyperlipidemia and diabetes mellitus, followed by Dr. Marisue Humble.  He has chronic kidney disease followed by Dr. Moshe Cipro, with recent worsening of creatinine to stage IV (creatinine 2.4) his anticoagulant dose has been adjusted for this.  He has a bland urinalysis.  Due to exertional angina, we performed a nuclear study in May 2019. It showed: "Large size (extent 58% of myocardium), severe intensity partially reversible (SDS 13) perfusion defects in multivessel territories, suggestive of extensive scar with some peri-infarct ischemia. The lateral wall appears somewhat viable. LVEF 24% with a moderately dilated LV  and severe inferior, septal, anterior and apical hypokinesis." Due to renal insufficiency, intensified medical therapy was prefered over angiography.   Past Medical History:  Diagnosis Date  . Atrial fibrillation ([place])    CHADS2Vasc is at least 5, on Xarelto   . CAD (coronary artery disease)   . cardiomyopathy    MDT ICD, Dr. Lovena Le 2009  . CHF (congestive heart failure) ([place])   . Diabetes mellitus   . Exogenous obesity   . Hyperlipidemia   . Hypertension   . Hypothyroidism   . MI, old [place], PTCA LAD per pt  . Sinus bradycardia     Past Surgical History:  Procedure Laterality Date  . ANGIOPLASTY    . CARDIAC CATHETERIZATION  03/13/1994   ACUTE MI WITH TOTAL OCCLUSION OF MID LAD. REPERFUSION AFTER CROSSING WITH A GUIDEWIRE. POOR RIGHT TO LEFT COLLATERAL FLOW. Pt states had PTCA  . CARDIAC DEFIBRILLATOR PLACEMENT    . CARDIOVASCULAR STRESS TEST  03/11/2008   EF 30%. NO REVERSIBLE ISCHEMIA  . EP IMPLANTABLE DEVICE N/A 09/28/2015   Procedure:  ICD Generator Changeout;  Surgeon: Evans Lance, MD;  Location: [facility];  Service: Cardiovascular;  Laterality: N/A;  . US ECHOCARDIOGRAPHY  04/26/2010   EF 35-40%. MODERATE LVH WITH MODERATE GLOBAL LV SYSTOLIC DYSFUNCTION AND IMPAIRED RELAXATION. MILD AORTIC SCLEROSIS WITHOUT STENOSIS. NORMAL PULMONARY ARTERY PRESSURE.  Marland Kitchen US ECHOCARDIOGRAPHY  10/07/2004   EF 30-35%    Current Medications: Outpatient Medications Prior to Visit  Medication Sig Dispense Refill  . amiodarone (PACERONE) 200 MG tablet Take 200 mg by mouth 2 (two) times daily.    . carvedilol (COREG) 6.25 MG tablet TAKE 1 TABLET BY MOUTH TWO  TIMES DAILY WITH A MEAL 180 tablet 0  . glimepiride (AMARYL) 1 MG tablet Take 1 mg by mouth daily as needed.    Marland Kitchen levothyroxine (SYNTHROID, LEVOTHROID) 112 MCG tablet     . Multiple Vitamin (MULTIVITAMIN) tablet Take 1 tablet by mouth daily.      . nitroGLYCERIN (NITROSTAT) 0.4 MG SL tablet Place 1 tablet (0.4 mg total) under the tongue every 5 (five) minutes as needed for chest pain. 25 tablet 2  . repaglinide (PRANDIN) 1 MG tablet     . rosuvastatin (CRESTOR) 40 MG tablet Take 40 mg by mouth daily.    . vitamin C (ASCORBIC ACID) 500 MG tablet Take 500 mg by mouth daily.       Alveda Reasons 15 MG TABS tablet TAKE 1 TABLET BY MOUTH  DAILY WITH SUPPER 90 tablet 1  . amiodarone (PACERONE) 200 MG tablet TAKE 1 TABLET BY MOUTH  DAILY 90 tablet 3  . amiodarone (PACERONE) 200 MG tablet TAKE 1 TABLET BY MOUTH  DAILY 90 tablet 1  . INVOKANA 300 MG TABS tablet TAKE 1 TABLET BY MOUTH  DAILY BEFORE BREAKFAST 90 tablet 3  . isosorbide mononitrate (IMDUR) 60 MG 24 hr tablet TAKE 1 TABLET BY MOUTH  DAILY 90 tablet 3   No facility-administered medications prior to visit.     Allergies:   Lipitor [atorvastatin calcium]   Social History   Socioeconomic History  . Marital status: Married    Spouse name: Not on file  . Number of children: Not on file  . Years of education: Not on file  . Highest education level: Not on file  Occupational History  . Occupation: Retired  Tobacco Use  . Smoking status: Never Smoker  . Smokeless tobacco: Never Used  Substance  and Sexual Activity  . Alcohol use: No  . Drug use: No  . Sexual activity: Not on file  Other Topics Concern  . Not on file  Social History Narrative   Lives with wife   Social Determinants of Health   Financial Resource Strain:   . Difficulty of Paying Living Expenses: Not on file  Food Insecurity:   . Worried About Charity fundraiser in the Last Year: Not on file  . Ran Out of Food in the Last Year: Not on file  Transportation Needs:   . Lack of Transportation (Medical): Not on file  . Lack of Transportation (Non-Medical): Not on file  Physical Activity:   . Days of Exercise per Week: Not on file  . Minutes of Exercise per Session: Not on file  Stress:   . Feeling of Stress : Not on file  Social Connections:   . Frequency of Communication with Friends and Family: Not on file  . Frequency of Social Gatherings with Friends and Family: Not on file  . Attends Religious Services: Not on file  . Active Member of Clubs or Organizations: Not on file  . Attends Archivist Meetings: Not on file  .  Marital Status: Not on file     Family History:  The patient's family history includes Heart attack in his mother; Heart disease in his mother.   ROS:   Please see the history of present illness.    ROS All other systems are reviewed and are negative.   PHYSICAL EXAM:   VS:  BP 112/67   Pulse 65   Temp (!) 97.1 F (36.2 C)   Ht 5' 10.5" (1.791 m)   Wt 199 lb 12.8 oz (90.6 kg)   SpO2 97%   BMI 28.26 kg/m      General: Alert, oriented x3, no distress, mildly overweight.  Healthy defibrillator site in the left subclavian area. Head: no evidence of trauma, PERRL, EOMI, no exophtalmos or lid lag, no myxedema, no xanthelasma; normal ears, nose and oropharynx Neck: normal jugular venous pulsations and no hepatojugular reflux; brisk carotid pulses without delay and no carotid bruits Chest: clear to auscultation, no signs of consolidation by percussion or palpation, normal fremitus, symmetrical and full respiratory excursions Cardiovascular: normal position and quality of the apical impulse, regular rhythm, normal first and second heart sounds, no murmurs, rubs or gallops Abdomen: no tenderness or distention, no masses by palpation, no abnormal pulsatility or arterial bruits, normal bowel sounds, no hepatosplenomegaly Extremities: no clubbing, cyanosis or edema; 2+ radial, ulnar and brachial pulses bilaterally; 2+ right femoral, posterior tibial and dorsalis pedis pulses; 2+ left femoral, posterior tibial and dorsalis pedis pulses; no subclavian or femoral bruits Neurological: grossly nonfocal Psych: Normal mood and affect   Wt Readings from Last 3 Encounters:  08/16/19 199 lb 12.8 oz (90.6 kg)  06/18/18 198 lb 9.6 oz (90.1 kg)  02/13/18 198 lb 3.2 oz (89.9 kg)      Studies/Labs Reviewed:   EKG:  EKG is ordered today.  The ekg ordered today demonstrates atrial paced, ventricular sensed rhythm with long AV delay 278 ms, left bundle branch block (QRS 176 ms), QTC 477ms   Recent  Labs: No results found for requested labs within last 8760 hours.   Lipid Panel    Component Value Date/Time   CHOL 242 (H) 06/20/2011 1039   TRIG 288.0 (H) 06/20/2011 1039   HDL 33.10 (L) 06/20/2011 1039   CHOLHDL 7 06/20/2011 1039  VLDL 57.6 (H) 06/20/2011 1039   LDLDIRECT 159.8 06/20/2011 1039    August 30, 2017 labs total cholesterol 120, HDL 40, LDL 57, triglycerides 109 TSH 0.55, creatinine 2.43, potassium 4.4, hemoglobin A1c 7.3%, normal liver function tests  02/08/2019 Total cholesterol 97, HDL 33, LDL 36, triglycerides 137 Hemoglobin A1c 7.6% Hemoglobin 11.9, creatinine 2.56, potassium 3.8, normal liver function tests, TSH 0.9  Additional studies/ records that were reviewed today include:  Records from last office visit June 2019 with Dr. Lovena Le,  remote device check 06/06/2018.  ASSESSMENT:    1. VT (ventricular tachycardia) ([place])   2. Chronic combined systolic and diastolic heart failure ([place])   3. PAF (paroxysmal atrial fibrillation) ([place])   4. Coronary artery disease of native artery of native heart with stable angina pectoris ([place])   5. LBBB (left bundle branch block)   6. ICD (implantable cardioverter-defibrillator) in place   7. Encounter for monitoring amiodarone therapy   8. Dyslipidemia   9. Uncontrolled secondary diabetes mellitus with stage 4 CKD (GFR 15-29) ([facility])   10. CKD (chronic kidney disease) stage 4, GFR 15-29 ml/min ([facility])      PLAN:  In order of problems listed above:  1. VT: He has not had any device intervention for tachyarrhythmias since June 2018.  Have not even recorded nonsustained VT since then.   2. CHF: Despite stopping his Invokana he remains clinically euvolemic (also euvolemic by OptiVol).  He is not taking loop diuretics.  NYHA functional class I.  He does not take RAAS inhibitors due to his advanced kidney disease. 3. AFib: No events have been reported in the last 12 months.  Asymptomatic atrial fibrillation has been  previously detected by his device, but none since his last visit.. On appropriate anticoagulation for CHADSVasc 5 (age 81, CAD, CHF, DM). No history of known stroke or TIA. 4. CAD: CCS functional class II exertional angina, will increase isosorbide mononitrate to 90 mg daily.  He has not had any headaches or other side effects from this. 5. LBBB: There is an option to upgrade to a cardiac resynchronization device.  His heart failure is very well controlled at this point however.  6. ICD: Followed in EP clinic by Dr. Lovena Le.  Performed in office download today to clear the counters and for clinical information, no billing. 7. Amiodarone: Check liver function tests and thyroid function tests every 6 months (today). 8. HLP: Excellent LDL cholesterol, well under target of 70.  HDL remains quite low.  Labs with PCP, Dr. Marisue Humble. 9. DM: followed by Dr. Marisue Humble.  Had to stop the Invokana.  Most recent A1c slightly high at 7.6% 10. CKD IV: Followed by Dr. Moshe Cipro.  Most recent creatinine 2.56    Medication Adjustments/Labs and Tests Ordered: Current medicines are reviewed at length with the patient today.  Concerns regarding medicines are outlined above.  Medication changes, Labs and Tests ordered today are listed in the Patient Instructions below. Patient Instructions  Medication Instructions:  INCREASE the Isosorbide to 90 mg once daily (a tablet and a half)  *If you need a refill on your cardiac medications before your next appointment, please call your pharmacy*  Lab Work: Your provider would like for you to have the following labs today: CMET and Liver  If you have labs (blood work) drawn today and your tests are completely normal, you will receive your results only by: Marland Kitchen MyChart Message (if you have MyChart) OR . A paper copy in the mail If you have  any lab test that is abnormal or we need to change your treatment, we will call you to review the results.  Testing/Procedures: None  ordered  Follow-Up: At [facility], you and your health needs are our priority.  As part of our continuing mission to provide you with exceptional heart care, we have created designated Provider Care Teams.  These Care Teams include your primary Cardiologist (physician) and Advanced Practice Providers (APPs -  Physician Assistants and Nurse Practitioners) who all work together to provide you with the care you need, when you need it.  Your next appointment:   12 month(s)  The format for your next appointment:   In Person  Provider:   [name], MD       Signed, [name], MD  08/16/2019 3:08 PM    [facility] [address], [place], [place]  [address] Phone: [phone]; Fax: [phone]

## 2019-08-16 NOTE — Addendum Note (Signed)
Addended by: Ricci Barker on: 08/16/2019 03:23 PM   Modules accepted: Orders

## 2019-08-17 ENCOUNTER — Other Ambulatory Visit: Payer: Self-pay | Admitting: Cardiovascular Disease

## 2019-08-17 LAB — COMPREHENSIVE METABOLIC PANEL
ALT: 17 IU/L (ref 0–44)
AST: 23 IU/L (ref 0–40)
Albumin/Globulin Ratio: 1.6 (ref 1.2–2.2)
Albumin: 4.3 g/dL (ref 3.7–4.7)
Alkaline Phosphatase: 90 IU/L (ref 39–117)
BUN/Creatinine Ratio: 15 (ref 10–24)
BUN: 47 mg/dL — ABNORMAL HIGH (ref 8–27)
Bilirubin Total: 0.3 mg/dL (ref 0.0–1.2)
CO2: 20 mmol/L (ref 20–29)
Calcium: 8.8 mg/dL (ref 8.6–10.2)
Chloride: 104 mmol/L (ref 96–106)
Creatinine, Ser: 3.23 mg/dL — ABNORMAL HIGH (ref 0.76–1.27)
GFR calc Af Amer: 20 mL/min/{1.73_m2} — ABNORMAL LOW (ref >59)
GFR calc non Af Amer: 17 mL/min/{1.73_m2} — ABNORMAL LOW (ref >59)
Globulin, Total: 2.7 g/dL (ref 1.5–4.5)
Glucose: 198 mg/dL — ABNORMAL HIGH (ref 65–99)
Potassium: 4.7 mmol/L (ref 3.5–5.2)
Sodium: 140 mmol/L (ref 134–144)
Total Protein: 7 g/dL (ref 6.0–8.5)

## 2019-08-17 LAB — HEPATIC FUNCTION PANEL: Bilirubin, Direct: 0.13 mg/dL (ref 0.00–0.40)

## 2019-08-17 LAB — TSH: TSH: 5.03 u[IU]/mL — ABNORMAL HIGH (ref 0.450–4.500)

## 2019-08-17 NOTE — Progress Notes (Signed)
Kidney function is a little worse than it was in October when he saw Dr. Moshe Cipro (creatinine 3.23 versus 2.56 in October).  GFR now less than 20. Unfortunately, I do not think Xarelto is safe anymore.  He should stop it. He has not had atrial fibrillation, as monitored by his defibrillator, in a very long time.  I think the safest thing for the time being is to just stop the anticoagulant and monitor for atrial fibrillation recurrence via his device.  If necessary, we will place him on warfarin. We will send a copy of his labs to Dr. Moshe Cipro. His TSH is borderline high.  When he has repeat lab tests, will also need a free T4.  Please send him a lab slip for TSH and free T4 and he can have those checked when he next has blood drawn into the nephrology clinic or with his PCP, Dr. Marisue Humble.  This is not urgent but preferably should be done in the next 2-3 months. Liver tests are normal

## 2019-08-21 ENCOUNTER — Telehealth: Payer: Self-pay | Admitting: *Deleted

## 2019-08-21 DIAGNOSIS — I5022 Chronic systolic (congestive) heart failure: Secondary | ICD-10-CM

## 2019-08-21 DIAGNOSIS — Z5181 Encounter for therapeutic drug level monitoring: Secondary | ICD-10-CM

## 2019-08-21 NOTE — Telephone Encounter (Signed)
-----   Message from [name], MD sent at 08/17/2019  5:51 PM EST ----- Kidney function is a little worse than it was in October when he saw Dr. Moshe Cipro (creatinine 3.23 versus 2.56 in October).  GFR now less than 20. Unfortunately, I do not think Xarelto is safe anymore.  He should stop it. He has not had atrial fibrillation, as monitored by his defibrillator, in a very long time.  I think the safest thing for the time being is to just stop the anticoagulant and monitor for atrial fibrillation recurrence via his device.  If necessary, we will place him on warfarin. We will send a copy of his labs to Dr. Moshe Cipro. His TSH is borderline high.  When he has repeat lab tests, will also need a free T4.  Please send him a lab slip for TSH and free T4 and he can have those checked when he next has blood drawn into the nephrology clinic or with his PCP, Dr. Marisue Humble.  This is not urgent but preferably should be done in the next 2-3 months. Liver tests are normal

## 2019-08-21 NOTE — Telephone Encounter (Signed)
Patient made aware of results and verbalized understanding.  Xarelto has been discontinued. Lab orders have been placed and will be mailed to him. He does not have an in office appointment at his PCP or nephrologist in the next few months so he will go to a [facility] or to the [place] office.

## 2019-08-28 ENCOUNTER — Other Ambulatory Visit: Payer: Self-pay | Admitting: *Deleted

## 2019-08-28 DIAGNOSIS — Z79899 Other long term (current) drug therapy: Secondary | ICD-10-CM

## 2019-08-28 DIAGNOSIS — Z5181 Encounter for therapeutic drug level monitoring: Secondary | ICD-10-CM

## 2019-08-28 DIAGNOSIS — I5022 Chronic systolic (congestive) heart failure: Secondary | ICD-10-CM

## 2019-09-04 ENCOUNTER — Other Ambulatory Visit: Payer: Self-pay | Admitting: Internal Medicine

## 2019-09-05 ENCOUNTER — Ambulatory Visit (INDEPENDENT_AMBULATORY_CARE_PROVIDER_SITE_OTHER): Payer: Medicare Other | Admitting: *Deleted

## 2019-09-05 DIAGNOSIS — I5022 Chronic systolic (congestive) heart failure: Secondary | ICD-10-CM | POA: Diagnosis not present

## 2019-09-05 LAB — CUP PACEART REMOTE DEVICE CHECK
Battery Remaining Longevity: 50 mo
Battery Voltage: 2.97 V
Brady Statistic AP VP Percent: 0.1 %
Brady Statistic AP VS Percent: 96.4 %
Brady Statistic AS VP Percent: 0 %
Brady Statistic AS VS Percent: 3.5 %
Brady Statistic RA Percent Paced: 95.47 %
Brady Statistic RV Percent Paced: 0.12 %
Date Time Interrogation Session: 20201231022824
HighPow Impedance: 41 Ohm
HighPow Impedance: 57 Ohm
Implantable Lead Implant Date: 20090908
Implantable Lead Implant Date: 20090908
Implantable Lead Location: 753859
Implantable Lead Location: 753860
Implantable Lead Model: 5076
Implantable Lead Model: 6947
Implantable Pulse Generator Implant Date: 20170123
Lead Channel Impedance Value: 380 Ohm
Lead Channel Impedance Value: 570 Ohm
Lead Channel Impedance Value: 627 Ohm
Lead Channel Pacing Threshold Amplitude: 0.75 V
Lead Channel Pacing Threshold Amplitude: 1 V
Lead Channel Pacing Threshold Pulse Width: 0.4 ms
Lead Channel Pacing Threshold Pulse Width: 0.4 ms
Lead Channel Sensing Intrinsic Amplitude: 15.625 mV
Lead Channel Sensing Intrinsic Amplitude: 15.625 mV
Lead Channel Sensing Intrinsic Amplitude: 4 mV
Lead Channel Sensing Intrinsic Amplitude: 4 mV
Lead Channel Setting Pacing Amplitude: 2 V
Lead Channel Setting Pacing Amplitude: 2.5 V
Lead Channel Setting Pacing Pulse Width: 0.4 ms
Lead Channel Setting Sensing Sensitivity: 0.3 mV

## 2019-09-06 NOTE — Progress Notes (Signed)
ICD remote 

## 2019-12-05 ENCOUNTER — Ambulatory Visit (INDEPENDENT_AMBULATORY_CARE_PROVIDER_SITE_OTHER): Payer: Medicare Other | Admitting: *Deleted

## 2019-12-05 DIAGNOSIS — I5022 Chronic systolic (congestive) heart failure: Secondary | ICD-10-CM | POA: Diagnosis not present

## 2019-12-05 LAB — CUP PACEART REMOTE DEVICE CHECK
Battery Remaining Longevity: 44 mo
Battery Voltage: 2.97 V
Brady Statistic AP VP Percent: 0.17 %
Brady Statistic AP VS Percent: 95.2 %
Brady Statistic AS VP Percent: 0 %
Brady Statistic AS VS Percent: 4.63 %
Brady Statistic RA Percent Paced: 93.99 %
Brady Statistic RV Percent Paced: 0.19 %
Date Time Interrogation Session: 20210401022704
HighPow Impedance: 40 Ohm
HighPow Impedance: 49 Ohm
Implantable Lead Implant Date: 20090908
Implantable Lead Implant Date: 20090908
Implantable Lead Location: 753859
Implantable Lead Location: 753860
Implantable Lead Model: 5076
Implantable Lead Model: 6947
Implantable Pulse Generator Implant Date: 20170123
Lead Channel Impedance Value: 380 Ohm
Lead Channel Impedance Value: 513 Ohm
Lead Channel Impedance Value: 589 Ohm
Lead Channel Pacing Threshold Amplitude: 0.75 V
Lead Channel Pacing Threshold Amplitude: 1 V
Lead Channel Pacing Threshold Pulse Width: 0.4 ms
Lead Channel Pacing Threshold Pulse Width: 0.4 ms
Lead Channel Sensing Intrinsic Amplitude: 15.375 mV
Lead Channel Sensing Intrinsic Amplitude: 15.375 mV
Lead Channel Sensing Intrinsic Amplitude: 3.625 mV
Lead Channel Sensing Intrinsic Amplitude: 3.625 mV
Lead Channel Setting Pacing Amplitude: 2 V
Lead Channel Setting Pacing Amplitude: 2.5 V
Lead Channel Setting Pacing Pulse Width: 0.4 ms
Lead Channel Setting Sensing Sensitivity: 0.3 mV

## 2019-12-06 NOTE — Progress Notes (Signed)
ICD Remote  

## 2019-12-09 ENCOUNTER — Other Ambulatory Visit: Payer: Self-pay | Admitting: Cardiovascular Disease

## 2019-12-31 ENCOUNTER — Other Ambulatory Visit: Payer: Self-pay | Admitting: Cardiovascular Disease

## 2020-02-25 ENCOUNTER — Other Ambulatory Visit: Payer: Self-pay | Admitting: Cardiovascular Disease

## 2020-03-05 ENCOUNTER — Ambulatory Visit (INDEPENDENT_AMBULATORY_CARE_PROVIDER_SITE_OTHER): Payer: Medicare Other | Admitting: *Deleted

## 2020-03-05 DIAGNOSIS — I255 Ischemic cardiomyopathy: Secondary | ICD-10-CM | POA: Diagnosis not present

## 2020-03-05 LAB — CUP PACEART REMOTE DEVICE CHECK
Battery Remaining Longevity: 42 mo
Battery Voltage: 2.96 V
Brady Statistic AP VP Percent: 0.22 %
Brady Statistic AP VS Percent: 96.37 %
Brady Statistic AS VP Percent: 0.01 %
Brady Statistic AS VS Percent: 3.4 %
Brady Statistic RA Percent Paced: 96.12 %
Brady Statistic RV Percent Paced: 0.26 %
Date Time Interrogation Session: 20210701022725
HighPow Impedance: 41 Ohm
HighPow Impedance: 52 Ohm
Implantable Lead Implant Date: 20090908
Implantable Lead Implant Date: 20090908
Implantable Lead Location: 753859
Implantable Lead Location: 753860
Implantable Lead Model: 5076
Implantable Lead Model: 6947
Implantable Pulse Generator Implant Date: 20170123
Lead Channel Impedance Value: 399 Ohm
Lead Channel Impedance Value: 589 Ohm
Lead Channel Impedance Value: 646 Ohm
Lead Channel Pacing Threshold Amplitude: 0.625 V
Lead Channel Pacing Threshold Amplitude: 0.875 V
Lead Channel Pacing Threshold Pulse Width: 0.4 ms
Lead Channel Pacing Threshold Pulse Width: 0.4 ms
Lead Channel Sensing Intrinsic Amplitude: 1 mV
Lead Channel Sensing Intrinsic Amplitude: 1 mV
Lead Channel Sensing Intrinsic Amplitude: 15 mV
Lead Channel Sensing Intrinsic Amplitude: 15 mV
Lead Channel Setting Pacing Amplitude: 2 V
Lead Channel Setting Pacing Amplitude: 2.5 V
Lead Channel Setting Pacing Pulse Width: 0.4 ms
Lead Channel Setting Sensing Sensitivity: 0.3 mV

## 2020-03-06 NOTE — Progress Notes (Signed)
Remote ICD transmission.   

## 2020-06-04 ENCOUNTER — Ambulatory Visit (INDEPENDENT_AMBULATORY_CARE_PROVIDER_SITE_OTHER): Payer: Medicare Other

## 2020-06-04 DIAGNOSIS — I255 Ischemic cardiomyopathy: Secondary | ICD-10-CM

## 2020-06-04 DIAGNOSIS — I5022 Chronic systolic (congestive) heart failure: Secondary | ICD-10-CM

## 2020-06-04 LAB — CUP PACEART REMOTE DEVICE CHECK
Battery Remaining Longevity: 36 mo
Battery Voltage: 2.96 V
Brady Statistic AP VP Percent: 0.28 %
Brady Statistic AP VS Percent: 95.58 %
Brady Statistic AS VP Percent: 0.01 %
Brady Statistic AS VS Percent: 4.13 %
Brady Statistic RA Percent Paced: 95.29 %
Brady Statistic RV Percent Paced: 0.32 %
Date Time Interrogation Session: 20210930012503
HighPow Impedance: 38 Ohm
HighPow Impedance: 47 Ohm
Implantable Lead Implant Date: 20090908
Implantable Lead Implant Date: 20090908
Implantable Lead Location: 753859
Implantable Lead Location: 753860
Implantable Lead Model: 5076
Implantable Lead Model: 6947
Implantable Pulse Generator Implant Date: 20170123
Lead Channel Impedance Value: 380 Ohm
Lead Channel Impedance Value: 513 Ohm
Lead Channel Impedance Value: 570 Ohm
Lead Channel Pacing Threshold Amplitude: 0.75 V
Lead Channel Pacing Threshold Amplitude: 0.875 V
Lead Channel Pacing Threshold Pulse Width: 0.4 ms
Lead Channel Pacing Threshold Pulse Width: 0.4 ms
Lead Channel Sensing Intrinsic Amplitude: 1.25 mV
Lead Channel Sensing Intrinsic Amplitude: 1.25 mV
Lead Channel Sensing Intrinsic Amplitude: 14.75 mV
Lead Channel Sensing Intrinsic Amplitude: 14.75 mV
Lead Channel Setting Pacing Amplitude: 2 V
Lead Channel Setting Pacing Amplitude: 2.5 V
Lead Channel Setting Pacing Pulse Width: 0.4 ms
Lead Channel Setting Sensing Sensitivity: 0.3 mV

## 2020-06-08 NOTE — Progress Notes (Signed)
Remote ICD transmission.   

## 2020-07-29 ENCOUNTER — Other Ambulatory Visit: Payer: Self-pay | Admitting: Cardiovascular Disease

## 2020-09-03 ENCOUNTER — Ambulatory Visit (INDEPENDENT_AMBULATORY_CARE_PROVIDER_SITE_OTHER): Payer: Medicare Other

## 2020-09-03 ENCOUNTER — Telehealth (INDEPENDENT_AMBULATORY_CARE_PROVIDER_SITE_OTHER): Payer: Medicare Other | Admitting: Cardiovascular Disease

## 2020-09-03 ENCOUNTER — Encounter: Payer: Self-pay | Admitting: Cardiovascular Disease

## 2020-09-03 VITALS — BP 133/84 | HR 77 | Ht 71.0 in | Wt 193.0 lb

## 2020-09-03 DIAGNOSIS — I5042 Chronic combined systolic (congestive) and diastolic (congestive) heart failure: Secondary | ICD-10-CM | POA: Diagnosis not present

## 2020-09-03 DIAGNOSIS — E1122 Type 2 diabetes mellitus with diabetic chronic kidney disease: Secondary | ICD-10-CM

## 2020-09-03 DIAGNOSIS — Z5181 Encounter for therapeutic drug level monitoring: Secondary | ICD-10-CM

## 2020-09-03 DIAGNOSIS — I480 Paroxysmal atrial fibrillation: Secondary | ICD-10-CM

## 2020-09-03 DIAGNOSIS — Z79899 Other long term (current) drug therapy: Secondary | ICD-10-CM

## 2020-09-03 DIAGNOSIS — I472 Ventricular tachycardia, unspecified: Secondary | ICD-10-CM

## 2020-09-03 DIAGNOSIS — I255 Ischemic cardiomyopathy: Secondary | ICD-10-CM

## 2020-09-03 DIAGNOSIS — E785 Hyperlipidemia, unspecified: Secondary | ICD-10-CM

## 2020-09-03 DIAGNOSIS — Z95810 Presence of automatic (implantable) cardiac defibrillator: Secondary | ICD-10-CM

## 2020-09-03 DIAGNOSIS — I25118 Atherosclerotic heart disease of native coronary artery with other forms of angina pectoris: Secondary | ICD-10-CM

## 2020-09-03 DIAGNOSIS — N184 Chronic kidney disease, stage 4 (severe): Secondary | ICD-10-CM

## 2020-09-03 DIAGNOSIS — I447 Left bundle-branch block, unspecified: Secondary | ICD-10-CM

## 2020-09-03 LAB — CUP PACEART REMOTE DEVICE CHECK
Battery Remaining Longevity: 32 mo
Battery Voltage: 2.95 V
Brady Statistic AP VP Percent: 0.21 %
Brady Statistic AP VS Percent: 91.04 %
Brady Statistic AS VP Percent: 0.01 %
Brady Statistic AS VS Percent: 8.74 %
Brady Statistic RA Percent Paced: 87.51 %
Brady Statistic RV Percent Paced: 0.23 %
Date Time Interrogation Session: 20211230061807
HighPow Impedance: 39 Ohm
HighPow Impedance: 45 Ohm
Implantable Lead Implant Date: 20090908
Implantable Lead Implant Date: 20090908
Implantable Lead Location: 753859
Implantable Lead Location: 753860
Implantable Lead Model: 5076
Implantable Lead Model: 6947
Implantable Pulse Generator Implant Date: 20170123
Lead Channel Impedance Value: 342 Ohm
Lead Channel Impedance Value: 456 Ohm
Lead Channel Impedance Value: 513 Ohm
Lead Channel Pacing Threshold Amplitude: 0.625 V
Lead Channel Pacing Threshold Amplitude: 0.875 V
Lead Channel Pacing Threshold Pulse Width: 0.4 ms
Lead Channel Pacing Threshold Pulse Width: 0.4 ms
Lead Channel Sensing Intrinsic Amplitude: 1.5 mV
Lead Channel Sensing Intrinsic Amplitude: 1.5 mV
Lead Channel Sensing Intrinsic Amplitude: 19.25 mV
Lead Channel Sensing Intrinsic Amplitude: 19.25 mV
Lead Channel Setting Pacing Amplitude: 2 V
Lead Channel Setting Pacing Amplitude: 2.5 V
Lead Channel Setting Pacing Pulse Width: 0.4 ms
Lead Channel Setting Sensing Sensitivity: 0.3 mV

## 2020-09-03 MED ORDER — NITROGLYCERIN 0.4 MG SL SUBL: 0.4000 mg | SUBLINGUAL_TABLET | SUBLINGUAL | 2 refills | Status: AC | PRN

## 2020-09-03 MED ORDER — ISOSORBIDE MONONITRATE ER 60 MG PO TB24: ORAL_TABLET | ORAL | 3 refills | Status: AC

## 2020-09-03 NOTE — Progress Notes (Signed)
.    Virtual Visit via Telephone Note   This visit type was conducted due to national recommendations for restrictions regarding the COVID-19 Pandemic (e.g. social distancing) in an effort to limit this patient's exposure and mitigate transmission in our community.  Due to his co-morbid illnesses, this patient is at least at moderate risk for complications without adequate follow up.  This format is felt to be most appropriate for this patient at this time.  The patient did not have access to video technology/had technical difficulties with video requiring transitioning to audio format only (telephone).  All issues noted in this document were discussed and addressed.  No physical exam could be performed with this format.  Please refer to the patient's chart for his  consent to telehealth for [facility].    Date:  09/03/2020   ID:  Rodney Lee, DOB 1941-03-28, MRN [id number] The patient was identified using 2 identifiers.  Patient Location: Home Provider Location: Office/Clinic  PCP:  Gaynelle Arabian, MD  Cardiologist:  [name], MD  Electrophysiologist:  Cristopher Peru, MD   Evaluation Performed:  Follow-Up Visit  Chief Complaint:  CHF, CAD with stable angina   History of Present Illness:  Rodney Lee is a 79 y.o. male , formerly patient of Dr. Mare Ferrari. Dr. Cristopher Peru follows his defibrillator.  He is here to follow-up for coronary artery disease and CHF.  He is generally doing quite well.  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  Occasionally when he is to perform a strenuous activity he will have brief chest tightness, that resolves after a minute or rest.  The pattern has been very stable for a long time.  He has not taken any nitroglycerin for it recently.  He is taking his isosorbide mononitrate at bedtime.  He denies problems with shortness of breath at rest with activity, lower extremity edema, claudication, focal neurological events,  orthopnea, PND, dizziness or syncope.  He describes a "knot" to the left of his umbilicus, roughly the size of a "$0.50 piece") it sounds like a description of an umbilical hernia.  The skin over it is a little reddish, but the site is nontender, he does not have abdominal pain and he denies fever chills or problems with bowel movements.  He has done very well since his last appointment.  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  He has predictable chest discomfort with activity when he rushes or lifts a heavier than usual loads.  The symptoms resolved within a minute when he stops.  He never has chest discomfort at rest.  He sometimes feels tired at the end of the day but he denies exertional dyspnea.  His Invokana was stopped due to worsening renal function.  He continues to take amiodarone and his defibrillator has not detected any meaningful atrial or ventricular arrhythmia since his last appointment.  He is compliant with his Xarelto anticoagulation (dose adjusted for renal function) and has not had any falls, injuries or bleeding.  He denies orthopnea, PND, leg edema, chest discomfort at rest or with activity, palpitations or syncope, intermittent claudication or focal neurological events.  Recent interrogation of his defibrillator shows normal function.  He has not had any atrial fibrillation but has brief episodes of atrial tachycardia.  He has atrial paced, ventricular sensed rhythm 91% of the time..  The device was implanted in 2017 and still has about 3 years  of longevity.  He is followed by Dr. Lovena Le.  Activity level has been pretty constant and he has not had major deviations of the OptiVol.  His most recent creatinine was not much changed at 2.74 (Dr. Moshe Cipro).  His LDL cholesterol is quite low at 36, but his HDL is also low at 31.  Glycemic control is good with an A1c of 6.9%.   He has a long-standing history of  coronary artery disease, following a large anterolateral myocardial infarction in 1995 treated with angioplasty. He has not required repeat revascularization since that time. Ever since then he has had moderately-to-severely depressed left ventricular systolic function (EF 16-10% by his  echo in December 2016, but estimated at 40-45% by echo in June 2018, anteroapical scar), but has had very well compensated congestive heart failure.  He received a defibrillator for primary prevention in 2009 and underwent a generator change out in January 2017.  Sustained VT in 2017 as treated with overdrive pacing. In 2018 he had an appropriate defibrillator discharge for VT and he was started on amiodarone. Poorly tolerant of the higher loading dose of amiodarone, tolerates low dose. In the past, his device has detected asymptomatic paroxysmal atrial fibrillation. He takes anticoagulation with Xarelto and has not had bleeding problems. He has hyperlipidemia and diabetes mellitus, followed by Dr. Marisue Humble.  He has chronic kidney disease followed by Dr. Moshe Cipro, with recent worsening of creatinine to stage IV (creatinine 2.4) his anticoagulant dose has been adjusted for this.  He has a bland urinalysis.  Due to exertional angina, we performed a nuclear study in May 2019. It showed: "Large size (extent 58% of myocardium), severe intensity partially reversible (SDS 13) perfusion defects in multivessel territories, suggestive of extensive scar with some peri-infarct ischemia. The lateral wall appears somewhat viable. LVEF 24% with a moderately dilated LV and severe inferior, septal, anterior and apical hypokinesis." Due to renal insufficiency, intensified medical therapy was prefered over angiography.   Past Medical History:  Diagnosis Date  . Atrial fibrillation ([place])    CHADS2Vasc is at least 5, on Xarelto  . CAD (coronary artery disease)   . cardiomyopathy    MDT ICD, Dr. Lovena Le 2009  . CHF (congestive heart  failure) ([place])   . Diabetes mellitus   . Exogenous obesity   . Hyperlipidemia   . Hypertension   . Hypothyroidism   . MI, old [place], PTCA LAD per pt  . Sinus bradycardia     Past Surgical History:  Procedure Laterality Date  . ANGIOPLASTY    . CARDIAC CATHETERIZATION  03/13/1994   ACUTE MI WITH TOTAL OCCLUSION OF MID LAD. REPERFUSION AFTER CROSSING WITH A GUIDEWIRE. POOR RIGHT TO LEFT COLLATERAL FLOW. Pt states had PTCA  . CARDIAC DEFIBRILLATOR PLACEMENT    . CARDIOVASCULAR STRESS TEST  03/11/2008   EF 30%. NO REVERSIBLE ISCHEMIA  . EP IMPLANTABLE DEVICE N/A 09/28/2015   Procedure:  ICD Generator Changeout;  Surgeon: Evans Lance, MD;  Location: [facility];  Service: Cardiovascular;  Laterality: N/A;  . US ECHOCARDIOGRAPHY  04/26/2010   EF 35-40%. MODERATE LVH WITH MODERATE GLOBAL LV SYSTOLIC DYSFUNCTION AND IMPAIRED RELAXATION. MILD AORTIC SCLEROSIS WITHOUT STENOSIS. NORMAL PULMONARY ARTERY PRESSURE.  Marland Kitchen US ECHOCARDIOGRAPHY  10/07/2004   EF 30-35%    Current Medications: Outpatient Medications Prior to Visit  Medication Sig Dispense Refill  . amiodarone (PACERONE) 200 MG tablet TAKE 1 TABLET BY MOUTH  DAILY 90 tablet 2  . carvedilol (COREG) 6.25  MG tablet TAKE 1 TABLET BY MOUTH  TWICE DAILY WITH A MEAL 180 tablet 2  . glimepiride (AMARYL) 1 MG tablet Take 1 mg by mouth daily as needed.    Marland Kitchen levothyroxine (SYNTHROID, LEVOTHROID) 112 MCG tablet     . Multiple Vitamin (MULTIVITAMIN) tablet Take 1 tablet by mouth daily.    . repaglinide (PRANDIN) 1 MG tablet     . rosuvastatin (CRESTOR) 40 MG tablet Take 40 mg by mouth daily.    . vitamin C (ASCORBIC ACID) 500 MG tablet Take 500 mg by mouth daily.    . isosorbide mononitrate (IMDUR) 60 MG 24 hr tablet TAKE 1 AND 1/2 TABLETS BY  MOUTH DAILY 135 tablet 3  . nitroGLYCERIN (NITROSTAT) 0.4 MG SL tablet Place 1 tablet (0.4 mg total) under the tongue every 5 (five) minutes as needed for chest pain. 25 tablet 2   No  facility-administered medications prior to visit.     Allergies:   Lipitor [atorvastatin calcium]   Social History   Socioeconomic History  . Marital status: Married    Spouse name: Not on file  . Number of children: Not on file  . Years of education: Not on file  . Highest education level: Not on file  Occupational History  . Occupation: Retired  Tobacco Use  . Smoking status: Never Smoker  . Smokeless tobacco: Never Used  Vaping Use  . Vaping Use: Never used  Substance and Sexual Activity  . Alcohol use: No  . Drug use: No  . Sexual activity: Not on file  Other Topics Concern  . Not on file  Social History Narrative   Lives with wife   Social Determinants of Health   Financial Resource Strain: Not on file  Food Insecurity: Not on file  Transportation Needs: Not on file  Physical Activity: Not on file  Stress: Not on file  Social Connections: Not on file     Family History:  The patient's family history includes Heart attack in his mother; Heart disease in his mother.   ROS:   Please see the history of present illness.    ROS All other systems are reviewed and are negative.   PHYSICAL EXAM:   VS:  BP 133/84   Pulse 77   Ht 5\' 11"  (1.803 m)   Wt 193 lb (87.5 kg)   BMI 26.92 kg/m      Vital signs reviewed.  Unable to examine.  Wt Readings from Last 3 Encounters:  09/03/20 193 lb (87.5 kg)  08/16/19 199 lb 12.8 oz (90.6 kg)  06/18/18 198 lb 9.6 oz (90.1 kg)      Studies/Labs Reviewed:   EKG:  EKG is not ordered today.  Intracardiac electrogram at his most recent defibrillator downloads shows atrial paced, ventricular sensed rhythm. Recent Labs: No results found for requested labs within last 8760 hours.  04/02/2020 Creatinine 2.74, potassium 4.8, hemoglobin A1c 6.9%, normal liver function tests, TSH 0.58 Total cholesterol 83, HDL 31, LDL 36, triglycerides 74 Lipid Panel    Component Value Date/Time   CHOL 242 (H) 06/20/2011 1039   TRIG 288.0  (H) 06/20/2011 1039   HDL 33.10 (L) 06/20/2011 1039   CHOLHDL 7 06/20/2011 1039   VLDL 57.6 (H) 06/20/2011 1039   LDLDIRECT 159.8 06/20/2011 1039    August 30, 2017 labs total cholesterol 120, HDL 40, LDL 57, triglycerides 109 TSH 0.55, creatinine 2.43, potassium 4.4, hemoglobin A1c 7.3%, normal liver function tests  02/08/2019 Total cholesterol 97, HDL  33, LDL 36, triglycerides 137 Hemoglobin A1c 7.6% Hemoglobin 11.9, creatinine 2.56, potassium 3.8, normal liver function tests, TSH 0.9  Additional studies/ records that were reviewed today include:  Records from device check today  ASSESSMENT:    1. VT (ventricular tachycardia) ([place])   2. Chronic combined systolic (congestive) and diastolic (congestive) heart failure (H[facility])   3. Paroxysmal atrial fibrillation ([place])   4. Coronary artery disease of native artery of native heart with stable angina pectoris ([place])   5. LBBB (left bundle branch block)   6. ICD (implantable cardioverter-defibrillator) in place   7. Encounter for monitoring amiodarone therapy   8. Dyslipidemia   9. CKD stage 4 due to type 2 diabetes mellitus ([place])      PLAN:  In order of problems listed above:  1. VT: No VT requiring device intervention for over 3 years.  On amiodarone. 2. CHF: NYHA functional class I, OptiVol stable.  On carvedilol, but not on RAAS inhibitors due to renal dysfunction. 3. AFib: This has not occurred in a long time, well over 1 year on appropriate anticoagulation for CHADSVasc 5 (age 12, CAD, CHF, DM). No history of known stroke or TIA. 4. CAD: CCS functional class II exertional angina.  I think the isosorbide is not helping since he is taking in the evening.  Recommended that he take most of it, if not all of it in the morning before he goes to work. 5. LBBB: There is an option to upgrade to a cardiac resynchronization device, if his heart failure becomes harder to manage. 6. ICD: Followed in EP clinic by Dr. Lovena Le.  Reviewed the  remote download from today with normal device function. 7. Amiodarone: He had liver function tests and thyroid function tests checked in July and is due to have repeat study next month.  No signs of toxicity to date. 8. HLP: LDL well within target range.Marland Kitchen  HDL remains quite low.  Labs with PCP, Dr. Marisue Humble. 9. DM: followed by Dr. Marisue Humble.  Good glycemic control with hemoglobin A1c less than 7% 10. CKD IV: Followed by Dr. Moshe Cipro.  Most recent creatinine 2. 7 4, only slightly worse compared to last year    Medication Adjustments/Labs and Tests Ordered: Current medicines are reviewed at length with the patient today.  Concerns regarding medicines are outlined above.  Medication changes, Labs and Tests ordered today are listed in the Patient Instructions below. Patient Instructions  Medication Instructions:  ISOSORBIDE MONONITRATE (IMDUR)- Take 60 mg once in the morning and 30 mg (half a tablet) in the evening.  *If you need a refill on your cardiac medications before your next appointment, please call your pharmacy*   Lab Work: Your provider would like for you to return at the end of January to have the following labs drawn: TSH and CMET. You do not need an appointment for the lab. Once in our office lobby there is a podium where you can sign in and ring the doorbell to alert [place] that you are here. The lab is open from 8:00 am to 4:30 pm; closed for lunch from 12:45pm-1:45pm.  If you have labs (blood work) drawn today and your tests are completely normal, you will receive your results only by: Marland Kitchen MyChart Message (if you have MyChart) OR . A paper copy in the mail If you have any lab test that is abnormal or we need to change your treatment, we will call you to review the results.   Testing/Procedures: None ordered  Follow-Up: At [facility], you and your health needs are our priority.  As part of our continuing mission to provide you with exceptional heart care, we have created  designated Provider Care Teams.  These Care Teams include your primary Cardiologist (physician) and Advanced Practice Providers (APPs -  Physician Assistants and Nurse Practitioners) who all work together to provide you with the care you need, when you need it.  We recommend signing up for the patient portal called "MyChart".  Sign up information is provided on this After Visit Summary.  MyChart is used to connect with patients for Virtual Visits (Telemedicine).  Patients are able to view lab/test results, encounter notes, upcoming appointments, etc.  Non-urgent messages can be sent to your provider as well.   To learn more about what you can do with MyChart, go to [email or web address].    Your next appointment:   12 month(s)  The format for your next appointment:   In Person  Provider:   You may see [name], MD or one of the following Advanced Practice Providers on your designated Care Team:    Almyra Deforest, PA-C  Fabian Sharp, [place] or   Roby Lofts, PA-C     Signed, [name], MD  09/03/2020 5:38 PM    [address], [place], [place]  [address] Phone: [phone]; Fax: [phone]

## 2020-09-03 NOTE — Patient Instructions (Signed)
Medication Instructions:  ISOSORBIDE MONONITRATE (IMDUR)- Take 60 mg once in the morning and 30 mg (half a tablet) in the evening.  *If you need a refill on your cardiac medications before your next appointment, please call your pharmacy*   Lab Work: Your provider would like for you to return at the end of January to have the following labs drawn: TSH and CMET. You do not need an appointment for the lab. Once in our office lobby there is a podium where you can sign in and ring the doorbell to alert [place] that you are here. The lab is open from 8:00 am to 4:30 pm; closed for lunch from 12:45pm-1:45pm.  If you have labs (blood work) drawn today and your tests are completely normal, you will receive your results only by: Marland Kitchen MyChart Message (if you have MyChart) OR . A paper copy in the mail If you have any lab test that is abnormal or we need to change your treatment, we will call you to review the results.   Testing/Procedures: None ordered   Follow-Up: At [facility], you and your health needs are our priority.  As part of our continuing mission to provide you with exceptional heart care, we have created designated Provider Care Teams.  These Care Teams include your primary Cardiologist (physician) and Advanced Practice Providers (APPs -  Physician Assistants and Nurse Practitioners) who all work together to provide you with the care you need, when you need it.  We recommend signing up for the patient portal called "MyChart".  Sign up information is provided on this After Visit Summary.  MyChart is used to connect with patients for Virtual Visits (Telemedicine).  Patients are able to view lab/test results, encounter notes, upcoming appointments, etc.  Non-urgent messages can be sent to your provider as well.   To learn more about what you can do with MyChart, go to [email or web address].    Your next appointment:   12 month(s)  The format for your next appointment:   In  Person  Provider:   You may see [name], MD or one of the following Advanced Practice Providers on your designated Care Team:    Almyra Deforest, PA-C  Fabian Sharp, PA-C or   Roby Lofts, [place]

## 2020-09-15 DIAGNOSIS — J069 Acute upper respiratory infection, unspecified: Secondary | ICD-10-CM | POA: Diagnosis not present

## 2020-09-18 NOTE — Progress Notes (Signed)
Remote ICD transmission.   

## 2020-11-10 DIAGNOSIS — D6869 Other thrombophilia: Secondary | ICD-10-CM | POA: Diagnosis not present

## 2020-11-10 DIAGNOSIS — Z0000 Encounter for general adult medical examination without abnormal findings: Secondary | ICD-10-CM | POA: Diagnosis not present

## 2020-11-10 DIAGNOSIS — I25119 Atherosclerotic heart disease of native coronary artery with unspecified angina pectoris: Secondary | ICD-10-CM | POA: Diagnosis not present

## 2020-11-10 DIAGNOSIS — E039 Hypothyroidism, unspecified: Secondary | ICD-10-CM | POA: Diagnosis not present

## 2020-11-10 DIAGNOSIS — I480 Paroxysmal atrial fibrillation: Secondary | ICD-10-CM | POA: Diagnosis not present

## 2020-11-10 DIAGNOSIS — I130 Hypertensive heart and chronic kidney disease with heart failure and stage 1 through stage 4 chronic kidney disease, or unspecified chronic kidney disease: Secondary | ICD-10-CM | POA: Diagnosis not present

## 2020-11-10 DIAGNOSIS — N184 Chronic kidney disease, stage 4 (severe): Secondary | ICD-10-CM | POA: Diagnosis not present

## 2020-11-10 DIAGNOSIS — I472 Ventricular tachycardia: Secondary | ICD-10-CM | POA: Diagnosis not present

## 2020-11-10 DIAGNOSIS — I5022 Chronic systolic (congestive) heart failure: Secondary | ICD-10-CM | POA: Diagnosis not present

## 2020-11-10 DIAGNOSIS — I208 Other forms of angina pectoris: Secondary | ICD-10-CM | POA: Diagnosis not present

## 2020-11-10 DIAGNOSIS — E1129 Type 2 diabetes mellitus with other diabetic kidney complication: Secondary | ICD-10-CM | POA: Diagnosis not present

## 2020-11-23 DIAGNOSIS — D485 Neoplasm of uncertain behavior of skin: Secondary | ICD-10-CM | POA: Diagnosis not present

## 2020-11-23 DIAGNOSIS — D179 Benign lipomatous neoplasm, unspecified: Secondary | ICD-10-CM | POA: Diagnosis not present

## 2020-11-25 DIAGNOSIS — R1904 Left lower quadrant abdominal swelling, mass and lump: Secondary | ICD-10-CM | POA: Diagnosis not present

## 2020-11-25 DIAGNOSIS — I255 Ischemic cardiomyopathy: Secondary | ICD-10-CM | POA: Diagnosis not present

## 2020-11-25 DIAGNOSIS — I25118 Atherosclerotic heart disease of native coronary artery with other forms of angina pectoris: Secondary | ICD-10-CM | POA: Diagnosis not present

## 2020-11-25 DIAGNOSIS — Z95810 Presence of automatic (implantable) cardiac defibrillator: Secondary | ICD-10-CM | POA: Diagnosis not present

## 2020-11-27 ENCOUNTER — Other Ambulatory Visit: Payer: Self-pay | Admitting: General Surgery

## 2020-11-27 DIAGNOSIS — R1904 Left lower quadrant abdominal swelling, mass and lump: Secondary | ICD-10-CM

## 2020-12-03 ENCOUNTER — Ambulatory Visit (INDEPENDENT_AMBULATORY_CARE_PROVIDER_SITE_OTHER): Payer: Medicare Other

## 2020-12-03 DIAGNOSIS — I255 Ischemic cardiomyopathy: Secondary | ICD-10-CM

## 2020-12-03 LAB — CUP PACEART REMOTE DEVICE CHECK
Battery Remaining Longevity: 29 mo
Battery Voltage: 2.95 V
Brady Statistic AP VP Percent: 0.19 %
Brady Statistic AP VS Percent: 94.3 %
Brady Statistic AS VP Percent: 0.01 %
Brady Statistic AS VS Percent: 5.5 %
Brady Statistic RA Percent Paced: 93.93 %
Brady Statistic RV Percent Paced: 0.21 %
Date Time Interrogation Session: 20220331033424
HighPow Impedance: 39 Ohm
HighPow Impedance: 47 Ohm
Implantable Lead Implant Date: 20090908
Implantable Lead Implant Date: 20090908
Implantable Lead Location: 753859
Implantable Lead Location: 753860
Implantable Lead Model: 5076
Implantable Lead Model: 6947
Implantable Pulse Generator Implant Date: 20170123
Lead Channel Impedance Value: 342 Ohm
Lead Channel Impedance Value: 456 Ohm
Lead Channel Impedance Value: 532 Ohm
Lead Channel Pacing Threshold Amplitude: 0.625 V
Lead Channel Pacing Threshold Amplitude: 0.875 V
Lead Channel Pacing Threshold Pulse Width: 0.4 ms
Lead Channel Pacing Threshold Pulse Width: 0.4 ms
Lead Channel Sensing Intrinsic Amplitude: 1.75 mV
Lead Channel Sensing Intrinsic Amplitude: 1.75 mV
Lead Channel Sensing Intrinsic Amplitude: 15.75 mV
Lead Channel Sensing Intrinsic Amplitude: 15.75 mV
Lead Channel Setting Pacing Amplitude: 2 V
Lead Channel Setting Pacing Amplitude: 2.5 V
Lead Channel Setting Pacing Pulse Width: 0.4 ms
Lead Channel Setting Sensing Sensitivity: 0.3 mV

## 2020-12-09 ENCOUNTER — Ambulatory Visit
Admission: RE | Admit: 2020-12-09 | Discharge: 2020-12-09 | Disposition: A | Discharge status: Home / Self Care | Payer: Medicare Other | Source: Ambulatory Visit | Attending: General Surgery | Admitting: General Surgery

## 2020-12-09 DIAGNOSIS — R1904 Left lower quadrant abdominal swelling, mass and lump: Secondary | ICD-10-CM

## 2020-12-09 DIAGNOSIS — R1909 Other intra-abdominal and pelvic swelling, mass and lump: Secondary | ICD-10-CM | POA: Diagnosis not present

## 2020-12-09 DIAGNOSIS — I700 Atherosclerosis of aorta: Secondary | ICD-10-CM | POA: Diagnosis not present

## 2020-12-09 DIAGNOSIS — K769 Liver disease, unspecified: Secondary | ICD-10-CM | POA: Diagnosis not present

## 2020-12-09 DIAGNOSIS — N3289 Other specified disorders of bladder: Secondary | ICD-10-CM | POA: Diagnosis not present

## 2020-12-14 ENCOUNTER — Other Ambulatory Visit: Payer: Self-pay | Admitting: General Surgery

## 2020-12-14 DIAGNOSIS — R1904 Left lower quadrant abdominal swelling, mass and lump: Secondary | ICD-10-CM | POA: Diagnosis not present

## 2020-12-14 DIAGNOSIS — C8330 Diffuse large B-cell lymphoma, unspecified site: Secondary | ICD-10-CM | POA: Diagnosis not present

## 2020-12-14 DIAGNOSIS — C8519 Unspecified B-cell lymphoma, extranodal and solid organ sites: Secondary | ICD-10-CM | POA: Diagnosis not present

## 2020-12-15 ENCOUNTER — Other Ambulatory Visit: Payer: Medicare Other

## 2020-12-17 NOTE — Progress Notes (Signed)
Remote ICD transmission.   

## 2021-01-01 ENCOUNTER — Telehealth: Payer: Self-pay | Admitting: Hematology and Oncology

## 2021-01-01 NOTE — Telephone Encounter (Signed)
Received a new pt referral from Dr. Redmond Pulling from [facility] for high grade b-cell lymphoma. [name] has been scheduled to see Dr. Lorenso Courier on 5/5 at 2pm. Appt date and time has been given to the pt's wife. Aware to arrive 20 minutes early.

## 2021-01-04 ENCOUNTER — Telehealth: Payer: Self-pay | Admitting: *Deleted

## 2021-01-04 NOTE — Telephone Encounter (Signed)
Received call from pt's wife asking what to expect at their first visit here on Thursday, 01/07/21. Advised that mostly it will be Dr. Lorenso Courier reviewing scan and biopsy results and discussing treatment options appropriate for his lymphoma and overall medical condition. Advised that we would likely obtain more lab work as well.  She is asking when treatments may start. Advised that it is usually about 2 weeks to get everything scheduled. She voiced understanding. She states she may one of their sons with them to Thursday's appt.  I advised that this would be ok.

## 2021-01-07 ENCOUNTER — Inpatient Hospital Stay: Payer: Medicare Other | Attending: Hematology and Oncology | Admitting: Hematology and Oncology

## 2021-01-07 ENCOUNTER — Encounter: Payer: Self-pay | Admitting: Hematology and Oncology

## 2021-01-07 ENCOUNTER — Other Ambulatory Visit: Payer: Self-pay

## 2021-01-07 ENCOUNTER — Inpatient Hospital Stay: Payer: Medicare Other

## 2021-01-07 ENCOUNTER — Telehealth: Payer: Self-pay | Admitting: *Deleted

## 2021-01-07 VITALS — BP 98/59 | HR 84 | Temp 99.1°F | Resp 20 | Ht 71.0 in | Wt 197.9 lb

## 2021-01-07 DIAGNOSIS — I129 Hypertensive chronic kidney disease with stage 1 through stage 4 chronic kidney disease, or unspecified chronic kidney disease: Secondary | ICD-10-CM | POA: Diagnosis not present

## 2021-01-07 DIAGNOSIS — I4891 Unspecified atrial fibrillation: Secondary | ICD-10-CM | POA: Insufficient documentation

## 2021-01-07 DIAGNOSIS — Z7984 Long term (current) use of oral hypoglycemic drugs: Secondary | ICD-10-CM | POA: Insufficient documentation

## 2021-01-07 DIAGNOSIS — C8510 Unspecified B-cell lymphoma, unspecified site: Secondary | ICD-10-CM | POA: Diagnosis not present

## 2021-01-07 DIAGNOSIS — N189 Chronic kidney disease, unspecified: Secondary | ICD-10-CM | POA: Insufficient documentation

## 2021-01-07 DIAGNOSIS — E669 Obesity, unspecified: Secondary | ICD-10-CM | POA: Insufficient documentation

## 2021-01-07 DIAGNOSIS — I2510 Atherosclerotic heart disease of native coronary artery without angina pectoris: Secondary | ICD-10-CM | POA: Diagnosis not present

## 2021-01-07 DIAGNOSIS — E1122 Type 2 diabetes mellitus with diabetic chronic kidney disease: Secondary | ICD-10-CM | POA: Diagnosis not present

## 2021-01-07 DIAGNOSIS — Z79899 Other long term (current) drug therapy: Secondary | ICD-10-CM | POA: Insufficient documentation

## 2021-01-07 DIAGNOSIS — I119 Hypertensive heart disease without heart failure: Secondary | ICD-10-CM | POA: Diagnosis not present

## 2021-01-07 LAB — CBC WITH DIFFERENTIAL (CANCER CENTER ONLY)
Abs Immature Granulocytes: 0.03 10*3/uL (ref 0.00–0.07)
Basophils Absolute: 0.1 10*3/uL (ref 0.0–0.1)
Basophils Relative: 2 %
Eosinophils Absolute: 0.1 10*3/uL (ref 0.0–0.5)
Eosinophils Relative: 2 %
HCT: 35 % — ABNORMAL LOW (ref 39.0–52.0)
Hemoglobin: 11.4 g/dL — ABNORMAL LOW (ref 13.0–17.0)
Immature Granulocytes: 1 %
Lymphocytes Relative: 29 %
Lymphs Abs: 1.5 10*3/uL (ref 0.7–4.0)
MCH: 35.1 pg — ABNORMAL HIGH (ref 26.0–34.0)
MCHC: 32.6 g/dL (ref 30.0–36.0)
MCV: 107.7 fL — ABNORMAL HIGH (ref 80.0–100.0)
Monocytes Absolute: 1 10*3/uL (ref 0.1–1.0)
Monocytes Relative: 20 %
Neutro Abs: 2.3 10*3/uL (ref 1.7–7.7)
Neutrophils Relative %: 46 %
Platelet Count: 85 10*3/uL — ABNORMAL LOW (ref 150–400)
RBC: 3.25 MIL/uL — ABNORMAL LOW (ref 4.22–5.81)
RDW: 14.6 % (ref 11.5–15.5)
WBC Count: 5 10*3/uL (ref 4.0–10.5)
nRBC: 0 % (ref 0.0–0.2)

## 2021-01-07 LAB — CMP (CANCER CENTER ONLY)
ALT: 22 U/L (ref 0–44)
AST: 41 U/L (ref 15–41)
Albumin: 3.6 g/dL (ref 3.5–5.0)
Alkaline Phosphatase: 78 U/L (ref 38–126)
Anion gap: 9 (ref 5–15)
BUN: 41 mg/dL — ABNORMAL HIGH (ref 8–23)
CO2: 26 mmol/L (ref 22–32)
Calcium: 9.5 mg/dL (ref 8.9–10.3)
Chloride: 102 mmol/L (ref 98–111)
Creatinine: 3.21 mg/dL (ref 0.61–1.24)
GFR, Estimated: 19 mL/min — ABNORMAL LOW (ref >60)
Glucose, Bld: 127 mg/dL — ABNORMAL HIGH (ref 70–99)
Potassium: 4.7 mmol/L (ref 3.5–5.1)
Sodium: 137 mmol/L (ref 135–145)
Total Bilirubin: 0.5 mg/dL (ref 0.3–1.2)
Total Protein: 7.7 g/dL (ref 6.5–8.1)

## 2021-01-07 LAB — HEPATITIS B SURFACE ANTIGEN: Hepatitis B Surface Ag: NONREACTIVE

## 2021-01-07 LAB — HEPATITIS C ANTIBODY: HCV Ab: NONREACTIVE

## 2021-01-07 LAB — HEPATITIS B CORE ANTIBODY, TOTAL: Hep B Core Total Ab: NONREACTIVE

## 2021-01-07 LAB — LACTATE DEHYDROGENASE: LDH: 289 U/L — ABNORMAL HIGH (ref 98–192)

## 2021-01-07 LAB — URIC ACID: Uric Acid, Serum: 4.2 mg/dL (ref 3.7–8.6)

## 2021-01-07 LAB — HEPATITIS B SURFACE ANTIBODY,QUALITATIVE: Hep B S Ab: NONREACTIVE

## 2021-01-07 NOTE — Progress Notes (Signed)
[facility] Telephone:([phone]   Fax:([phone]) [place] NOTE  Patient Care Team: Gaynelle Arabian, MD as PCP - General (Family Medicine) Evans Lance, MD as PCP - Electrophysiology (Cardiology) [name], MD as PCP - Cardiology (Cardiology)  Hematological/Oncological History # High Grade B Cell Lymphoma, Staging/Diagnosis Underway 12/10/2020: CT Abdomen/Pelvis shows a soft tissue mass at 6.7 cm with soft tissue stranding.  12/14/2020: punch biopsy shows findings consistent with a high grade B cell lymphoma.  01/07/2021: establish care with Dr. Lorenso Courier   CHIEF COMPLAINTS/PURPOSE OF CONSULTATION:  " High Grade B Cell Lymphoma"  HISTORY OF PRESENTING ILLNESS:  HORALD BIRKY 80 y.o. male with medical history significant for atrial fibrillation, CAD, DM type II, HLD, HTN, and obesity who presents for evaluation of an abdominal mass concerning for lymphoma.   On review of the previous records Mr. Brahm underwent a CT scan of his abdomen on 12/10/2020 at which time a soft tissue mass approximately 6.7 cm was noted.  On 12/14/2020 the patient was seen by surgery who performed a punch biopsy of this lesion with pathology results consistent with a high-grade B-cell lymphoma.  Due to concern for this new diagnosis of lymphoma the patient was referred to hematology for further evaluation and management.  On exam today Mr. Berenson is accompanied by his wife and daughter. He reports that he began noticing this lesion under her skin approximately 6 months ago.  He reports that it started about marble sized until about 4 to 5 weeks ago he began feeling 3 kn under his skin.  It became larger and red and erythematous at which time he was referred to dermatology which subsequently sent him to surgery who performed the punch biopsies which showed [place] the diagnosis of lymphoma.  The patient does endorse having weight loss and having a lot of weakness and fatigue.  He used to work  many days a week in the garden but now is barely able to do this.  He has also had a marked decrease in his appetite.  He fortunately denies any fevers, chills, sweats, nausea, vomiting or diarrhea.  A full 10 point ROS is listed below.  On further discussion the patient reports that he has no family history remarkable for blood disorders or blood diseases.  His mother passed away of heart attack at 39 and his father died of dementia at age 98.  He has 3 children and 5 grandchildren all of whom are healthy.  He is a never smoker and quit drinking alcohol approximately 45 years ago.  He previously worked in [employer] for 4 years from 1961 to 1965 but never served in [place] or an active war zone.  MEDICAL HISTORY:  Past Medical History:  Diagnosis Date  . Atrial fibrillation ([place])    CHADS2Vasc is at least 5, on Xarelto  . CAD (coronary artery disease)   . cardiomyopathy    MDT ICD, Dr. Lovena Le 2009  . CHF (congestive heart failure) ([place])   . Diabetes mellitus   . Exogenous obesity   . Hyperlipidemia   . Hypertension   . Hypothyroidism   . MI, old [place], PTCA LAD per pt  . Sinus bradycardia     SURGICAL HISTORY: Past Surgical History:  Procedure Laterality Date  . ANGIOPLASTY    . CARDIAC CATHETERIZATION  03/13/1994   ACUTE MI WITH TOTAL OCCLUSION OF MID LAD. REPERFUSION AFTER CROSSING WITH A GUIDEWIRE. POOR RIGHT TO LEFT COLLATERAL  FLOW. Pt states had PTCA  . CARDIAC DEFIBRILLATOR PLACEMENT    . CARDIOVASCULAR STRESS TEST  03/11/2008   EF 30%. NO REVERSIBLE ISCHEMIA  . EP IMPLANTABLE DEVICE N/A 09/28/2015   Procedure:  ICD Generator Changeout;  Surgeon: Evans Lance, MD;  Location: [facility];  Service: Cardiovascular;  Laterality: N/A;  . US ECHOCARDIOGRAPHY  04/26/2010   EF 35-40%. MODERATE LVH WITH MODERATE GLOBAL LV SYSTOLIC DYSFUNCTION AND IMPAIRED RELAXATION. MILD AORTIC SCLEROSIS WITHOUT STENOSIS. NORMAL PULMONARY ARTERY PRESSURE.  Marland Kitchen US  ECHOCARDIOGRAPHY  10/07/2004   EF 30-35%    SOCIAL HISTORY: Social History   Socioeconomic History  . Marital status: Married    Spouse name: Not on file  . Number of children: Not on file  . Years of education: Not on file  . Highest education level: Not on file  Occupational History  . Occupation: Retired  Tobacco Use  . Smoking status: Never Smoker  . Smokeless tobacco: Never Used  Vaping Use  . Vaping Use: Never used  Substance and Sexual Activity  . Alcohol use: No  . Drug use: No  . Sexual activity: Not on file  Other Topics Concern  . Not on file  Social History Narrative   Lives with wife   Social Determinants of Health   Financial Resource Strain: Not on file  Food Insecurity: Not on file  Transportation Needs: Not on file  Physical Activity: Not on file  Stress: Not on file  Social Connections: Not on file  Intimate Partner Violence: Not on file    FAMILY HISTORY: Family History  Problem Relation Age of Onset  . Heart disease Mother   . Heart attack Mother     ALLERGIES:  is allergic to canagliflozin and lipitor [atorvastatin calcium].  MEDICATIONS:  Current Outpatient Medications  Medication Sig Dispense Refill  . fexofenadine (ALLEGRA ALLERGY) 60 MG tablet 1 capsule    . amiodarone (PACERONE) 200 MG tablet TAKE 1 TABLET BY MOUTH  DAILY 90 tablet 2  . carvedilol (COREG) 6.25 MG tablet TAKE 1 TABLET BY MOUTH  TWICE DAILY WITH A MEAL 180 tablet 2  . citalopram (CELEXA) 20 MG tablet 1 tablet    . glimepiride (AMARYL) 1 MG tablet Take 1 mg by mouth daily as needed.    . isosorbide mononitrate (IMDUR) 60 MG 24 hr tablet Take 60 mg in the morning and 30 mg (half a tablet) in the evening. 135 tablet 3  . levothyroxine (SYNTHROID, LEVOTHROID) 112 MCG tablet     . Multiple Vitamin (MULTIVITAMIN) tablet Take 1 tablet by mouth daily.    . nitroGLYCERIN (NITROSTAT) 0.4 MG SL tablet Place 1 tablet (0.4 mg total) under the tongue every 5 (five) minutes as needed  for chest pain. 25 tablet 2  . repaglinide (PRANDIN) 1 MG tablet     . rosuvastatin (CRESTOR) 40 MG tablet Take 40 mg by mouth daily.    . vitamin C (ASCORBIC ACID) 500 MG tablet Take 500 mg by mouth daily.     No current facility-administered medications for this visit.    REVIEW OF SYSTEMS:   Constitutional: ( - ) fevers, ( - )  chills , ( - ) night sweats Eyes: ( - ) blurriness of vision, ( - ) double vision, ( - ) watery eyes Ears, nose, mouth, throat, and face: ( - ) mucositis, ( - ) sore throat Respiratory: ( - ) cough, ( - ) dyspnea, ( - ) wheezes Cardiovascular: ( - )  palpitation, ( - ) chest discomfort, ( - ) lower extremity swelling Gastrointestinal:  ( - ) nausea, ( - ) heartburn, ( - ) change in bowel habits Skin: ( - ) abnormal skin rashes Lymphatics: ( - ) new lymphadenopathy, ( - ) easy bruising Neurological: ( - ) numbness, ( - ) tingling, ( - ) new weaknesses Behavioral/Psych: ( - ) mood change, ( - ) new changes  All other systems were reviewed with the patient and are negative.  PHYSICAL EXAMINATION: ECOG PERFORMANCE STATUS: 2 - Symptomatic, <50% confined to bed  Vitals:   01/07/21 1410  BP: (!) 98/59  Pulse: 84  Resp: 20  Temp: 99.1 F (37.3 C)  SpO2: 100%   Filed Weights   01/07/21 1410  Weight: 197 lb 14.4 oz (89.8 kg)    GENERAL: well appearing elderly Caucasian male in NAD  SKIN: skin color, texture, turgor are normal, no rashes or significant lesions EYES: conjunctiva are pink and non-injected, sclera clear OROPHARYNX: no exudate, no erythema; lips, buccal mucosa, and tongue normal  NECK: supple, non-tender LYMPH: palpable lymphadenopathy in the suboccipital and right cervical chains. No axillary or supraclavicular lymph nodes.  LUNGS: clear to auscultation and percussion with normal breathing effort HEART: regular rate & rhythm and no murmurs and no lower extremity edema ABDOMEN: Large discolored lesion protruding through the skin with  discharge.  No signs of active infection. Musculoskeletal: no cyanosis of digits and no clubbing  PSYCH: alert & oriented x 3, fluent speech NEURO: no focal motor/sensory deficits  LABORATORY DATA:  I have reviewed the data as listed CBC Latest Ref Rng & Units 01/07/2021 03/01/2017 04/30/2016  WBC 4.0 - 10.5 K/uL 5.0 7.7 -  Hemoglobin 13.0 - 17.0 g/dL 11.4(L) 16.3 17.3(H)  Hematocrit 39.0 - 52.0 % 35.0(L) 48.5 51.0  Platelets 150 - 400 K/uL 85(L) 122(L) -    CMP Latest Ref Rng & Units 01/07/2021 08/16/2019 03/03/2017  Glucose 70 - 99 mg/dL 127(H) 198(H) 155(H)  BUN 8 - 23 mg/dL 41(H) 47(H) 24(H)  Creatinine 0.61 - 1.24 mg/dL 3.21(HH) 3.23(H) 1.57(H)  Sodium 135 - 145 mmol/L 137 140 138  Potassium 3.5 - 5.1 mmol/L 4.7 4.7 3.9  Chloride 98 - 111 mmol/L 102 104 104  CO2 22 - 32 mmol/L 26 20 27   Calcium 8.9 - 10.3 mg/dL 9.5 8.8 8.8(L)  Total Protein 6.5 - 8.1 g/dL 7.7 7.0 -  Total Bilirubin 0.3 - 1.2 mg/dL 0.5 0.3 -  Alkaline Phos 38 - 126 U/L 78 90 -  AST 15 - 41 U/L 41 23 -  ALT 0 - 44 U/L 22 17 -    RADIOGRAPHIC STUDIES: I have personally reviewed the radiological images as listed and agreed with the findings in the report: large abdominal mass, consistent with biopsy proven lymphoma.   CT ABDOMEN PELVIS WO CONTRAST  Result Date: 12/10/2020 CLINICAL DATA:  Left abdominal wall mass, marked with tablet EXAM: CT ABDOMEN AND PELVIS WITHOUT CONTRAST TECHNIQUE: Multidetector CT imaging of the abdomen and pelvis was performed following the standard protocol without IV contrast. COMPARISON:  None. FINDINGS: Lower chest: Scattered nonspecific ground-glass opacity in the dependent right lung base (series 9, image 8). Hepatobiliary: No solid liver abnormality is seen. High attenuation of the liver parenchyma, suggestive of amiodarone use. No gallstones, gallbladder wall thickening, or biliary dilatation. Pancreas: Unremarkable. No pancreatic ductal dilatation or surrounding inflammatory changes.  Spleen: Normal in size without significant abnormality. Adrenals/Urinary Tract: Adrenal glands are unremarkable. Atrophic appearing kidneys.  Kidneys are otherwise normal, without renal calculi, solid lesion, or hydronephrosis. Thickening of the decompressed urinary bladder. Stomach/Bowel: Stomach is within normal limits. Appendix appears normal. No evidence of bowel wall thickening, distention, or inflammatory changes. Sigmoid diverticula. Vascular/Lymphatic: Aortic atherosclerosis. No enlarged abdominal or pelvic lymph nodes. Reproductive: Prostatomegaly. Other: There is a rounded soft tissue mass of the subcutaneous ventral left hemiabdomen adjacent to the umbilicus, measuring 6.7 x 4.3 x 6.3 cm (series 2, image 50, series 6, image 120). There is adjacent soft tissue stranding. No abdominopelvic ascites. Musculoskeletal: No acute or significant osseous findings. IMPRESSION: 1. There is a rounded soft tissue mass of the subcutaneous ventral left hemiabdomen adjacent to the umbilicus, measuring 6.7 cm with adjacent soft tissue stranding. Findings are concerning for soft tissue malignancy, although may reflect a large phlegmon/abscess. Consider tissue sampling. 2. Prostatomegaly with thickening of the decompressed urinary bladder, likely due to chronic outlet obstruction. 3. High attenuation of the liver parenchyma, suggestive of amiodarone use. These results will be called to the ordering clinician or representative by the Radiologist Assistant, and communication documented in the PACS or [employer]. Aortic Atherosclerosis (ICD10-I70.0). Electronically Signed   By: Eddie Candle M.D.   On: 12/10/2020 15:49    ASSESSMENT & PLAN AMARI ZAGAL 80 y.o. male with medical history significant for atrial fibrillation, CAD, DM type II, HLD, HTN, and obesity who presents for evaluation of an abdominal mass concerning for lymphoma.   After review the labs, redirects, discussed with the patient the findings most  consistent with a diffuse large B-cell lymphoma involving a large abdominal nodule which is breaching the skin.  The CT scan of the abdomen did not show evidence of disease elsewhere, though this was only a CT abdomen.  In order to complete staging we will need to perform a PET CT scan.  In regards to systemic therapy I am deeply concerned about the patient's numerous comorbidities.  He has heart disease, CKD, and apparent liver dysfunction with elevated MCV and thrombocytopenia.  We will need to evaluate this elevated MCV and thrombocytopenia further with nutritional studies, though it has been chronic and steadily increasing over the last 12 years.  If we are fortunate the patient will have stage I disease with only this area in the abdomen which could be considered for palliative radiation therapy.  In the event he was not a candidate for full systemic therapy we could consider rituximab monotherapy alone in the palliative setting. R-CEOP can be considered but we have to do dose adjustments for his numerous comorbidities which may make the regimen considerably less efficacious.  # High Grade B Cell Lymphoma, Staging/Diagnosis Underway --After review the pathology report the findings are most consistent with a diffuse large B-cell lymphoma ABC subtype --once staging is complete we will need to discuss treatment options. I would consider R-mini-CEOP as treatment --recommend avoiding anthracyclines in setting of poor baseline cardiac function.  -- Patient does appear to have poor liver and renal function.  This may severely limit our ability to administer chemotherapy and we will need to consider this when determining treatment course moving forward. -- We will have patient return to clinic following his PET CT scan to discuss options moving forward.  We will order a port to be placed in about 2 weeks time which can be canceled in the event that systemic therapy is deemed inappropriate for him.  Orders  Placed This Encounter  Procedures  . NM PET Image Initial (PI) Skull Base To  Thigh    Standing Status:   Future    Standing Expiration Date:   01/07/2022    Order Specific Question:   If indicated for the ordered procedure, I authorize the administration of a radiopharmaceutical per Radiology protocol    Answer:   Yes    Order Specific Question:   Preferred imaging location?    Answer:   [facility]  . IR IMAGING GUIDED PORT INSERTION    Please schedule in approximately 2 weeks (after PET CT scan)    Standing Status:   Future    Standing Expiration Date:   01/07/2022    Order Specific Question:   Reason for Exam (SYMPTOM  OR DIAGNOSIS REQUIRED)    Answer:   new diagnosis of lymphoma. Requesting port placement.    Order Specific Question:   Preferred Imaging Location?    Answer:   [facility]  . CBC with Differential ([place] Only)    Standing Status:   Future    Number of Occurrences:   1    Standing Expiration Date:   01/07/2022  . CMP ([place] only)    Standing Status:   Future    Number of Occurrences:   1    Standing Expiration Date:   01/07/2022  . Lactate dehydrogenase (LDH)    Standing Status:   Future    Number of Occurrences:   1    Standing Expiration Date:   01/07/2022  . Hepatitis B surface antigen    Standing Status:   Future    Number of Occurrences:   1    Standing Expiration Date:   01/07/2022  . Hepatitis C antibody    Standing Status:   Future    Number of Occurrences:   1    Standing Expiration Date:   01/07/2022  . Hepatitis B core antibody, total    Standing Status:   Future    Number of Occurrences:   1    Standing Expiration Date:   01/07/2022  . Hepatitis B surface antibody    Standing Status:   Future    Number of Occurrences:   1    Standing Expiration Date:   01/07/2022  . Uric acid    Standing Status:   Future    Number of Occurrences:   1    Standing Expiration Date:   01/07/2022   All questions were answered. The patient knows to call  the clinic with any problems, questions or concerns.  A total of more than 60 minutes were spent on this encounter and over half of that time was spent on counseling and coordination of care as outlined above.   Ledell Peoples, MD Department of Hematology/Oncology [place] [facility] Phone: [phone] Pager: [phone] Email: [email or web address]  01/07/2021 4:54 PM   Merli F, Gardiner Sleeper, Gustavus Messing, Tucci A, Ilariucci F, Shaniko A, Musso M, Di Rocco A, Stelitano C, Pomeroy I, Baldini L, , Salvi F, Arcari A, Fragasso A, Gobbi PG, Deville AM, Federico M. Cyclophosphamide, doxorubicin, vincristine, prednisone and rituximab versus epirubicin, cyclophosphamide, vinblastine, prednisone and rituximab for the initial treatment of elderly "fit" patients with diffuse large B-cell lymphoma: results from the [place] trial of the [employer]. Leuk Lymphoma. 2012 Apr;53(4):581-8.  -- Overall, the rate of complete remission was 70% (p = 0.466). After a median follow-up of 42 months, 5-year event-free survival (EFS) rates were 46% and 48% for R-miniCEOP  and R-CHOP, respectively (p = 0.538). Patients older than 72 years and with low-risk disease had a better outcome when treated with R-miniCEOP (p = 0.011). Overall R-CHOP and R-miniCEOP are similarly effective for elderly "fit" patients with DLBCL. The less intense R-miniCEOP may be an acceptable option for the treatment of relatively older patients with low-risk disease.

## 2021-01-07 NOTE — Telephone Encounter (Signed)
CRITICAL VALUE STICKER  CRITICAL VALUE: Creatinine3.21  RECEIVER (on-site recipient of call): Drucie Ip, RN [place] NOTIFIED: 1620  01/07/21  MESSENGER (representative from lab): Tomi Bamberger  MD NOTIFIED:  Dr. Lorenso Courier  TIME OF NOTIFICATION: 1625   RESPONSE: acknowledged

## 2021-01-08 ENCOUNTER — Telehealth: Payer: Self-pay | Admitting: Hematology and Oncology

## 2021-01-08 NOTE — Telephone Encounter (Signed)
Scheduled per los. Called and spoke with patient. Confirmed appt 

## 2021-01-18 ENCOUNTER — Other Ambulatory Visit: Payer: Self-pay | Admitting: Cardiovascular Disease

## 2021-01-21 ENCOUNTER — Other Ambulatory Visit: Payer: Self-pay

## 2021-01-21 ENCOUNTER — Ambulatory Visit (HOSPITAL_COMMUNITY)
Admission: RE | Admit: 2021-01-21 | Discharge: 2021-01-21 | Disposition: A | Discharge status: Home / Self Care | Payer: Medicare Other | Source: Ambulatory Visit | Attending: Hematology and Oncology | Admitting: Hematology and Oncology

## 2021-01-21 DIAGNOSIS — C8510 Unspecified B-cell lymphoma, unspecified site: Secondary | ICD-10-CM

## 2021-01-21 DIAGNOSIS — C8330 Diffuse large B-cell lymphoma, unspecified site: Secondary | ICD-10-CM | POA: Diagnosis not present

## 2021-01-21 LAB — GLUCOSE, CAPILLARY: Glucose-Capillary: 108 mg/dL — ABNORMAL HIGH (ref 70–99)

## 2021-01-21 MED ORDER — FLUDEOXYGLUCOSE F - 18 (FDG) INJECTION
10.0000 | Freq: Once | INTRAVENOUS | Status: AC | PRN
  Administered 2021-01-21: 9.8 via INTRAVENOUS

## 2021-01-25 ENCOUNTER — Inpatient Hospital Stay: Payer: Medicare Other | Admitting: Hematology and Oncology

## 2021-01-25 ENCOUNTER — Other Ambulatory Visit: Payer: Self-pay

## 2021-01-25 ENCOUNTER — Inpatient Hospital Stay: Payer: Medicare Other

## 2021-01-25 ENCOUNTER — Other Ambulatory Visit: Payer: Self-pay | Admitting: Hematology and Oncology

## 2021-01-25 ENCOUNTER — Telehealth: Payer: Self-pay

## 2021-01-25 VITALS — BP 94/54 | HR 82 | Temp 98.5°F | Resp 17 | Ht 71.0 in | Wt 191.9 lb

## 2021-01-25 DIAGNOSIS — I2510 Atherosclerotic heart disease of native coronary artery without angina pectoris: Secondary | ICD-10-CM | POA: Diagnosis not present

## 2021-01-25 DIAGNOSIS — C8510 Unspecified B-cell lymphoma, unspecified site: Secondary | ICD-10-CM

## 2021-01-25 DIAGNOSIS — Z7984 Long term (current) use of oral hypoglycemic drugs: Secondary | ICD-10-CM | POA: Diagnosis not present

## 2021-01-25 DIAGNOSIS — Z79899 Other long term (current) drug therapy: Secondary | ICD-10-CM | POA: Diagnosis not present

## 2021-01-25 DIAGNOSIS — N189 Chronic kidney disease, unspecified: Secondary | ICD-10-CM | POA: Diagnosis not present

## 2021-01-25 DIAGNOSIS — I119 Hypertensive heart disease without heart failure: Secondary | ICD-10-CM | POA: Diagnosis not present

## 2021-01-25 DIAGNOSIS — I4891 Unspecified atrial fibrillation: Secondary | ICD-10-CM | POA: Diagnosis not present

## 2021-01-25 DIAGNOSIS — E1122 Type 2 diabetes mellitus with diabetic chronic kidney disease: Secondary | ICD-10-CM | POA: Diagnosis not present

## 2021-01-25 LAB — CBC WITH DIFFERENTIAL (CANCER CENTER ONLY)
Abs Immature Granulocytes: 0.04 10*3/uL (ref 0.00–0.07)
Basophils Absolute: 0.1 10*3/uL (ref 0.0–0.1)
Basophils Relative: 1 %
Eosinophils Absolute: 0.1 10*3/uL (ref 0.0–0.5)
Eosinophils Relative: 1 %
HCT: 34 % — ABNORMAL LOW (ref 39.0–52.0)
Hemoglobin: 11 g/dL — ABNORMAL LOW (ref 13.0–17.0)
Immature Granulocytes: 1 %
Lymphocytes Relative: 23 %
Lymphs Abs: 1.1 10*3/uL (ref 0.7–4.0)
MCH: 34.7 pg — ABNORMAL HIGH (ref 26.0–34.0)
MCHC: 32.4 g/dL (ref 30.0–36.0)
MCV: 107.3 fL — ABNORMAL HIGH (ref 80.0–100.0)
Monocytes Absolute: 0.8 10*3/uL (ref 0.1–1.0)
Monocytes Relative: 17 %
Neutro Abs: 2.8 10*3/uL (ref 1.7–7.7)
Neutrophils Relative %: 57 %
Platelet Count: 76 10*3/uL — ABNORMAL LOW (ref 150–400)
RBC: 3.17 MIL/uL — ABNORMAL LOW (ref 4.22–5.81)
RDW: 14.6 % (ref 11.5–15.5)
WBC Count: 4.9 10*3/uL (ref 4.0–10.5)
nRBC: 0 % (ref 0.0–0.2)

## 2021-01-25 LAB — CMP (CANCER CENTER ONLY)
ALT: 27 U/L (ref 0–44)
AST: 47 U/L — ABNORMAL HIGH (ref 15–41)
Albumin: 3.4 g/dL — ABNORMAL LOW (ref 3.5–5.0)
Alkaline Phosphatase: 64 U/L (ref 38–126)
Anion gap: 8 (ref 5–15)
BUN: 47 mg/dL — ABNORMAL HIGH (ref 8–23)
CO2: 27 mmol/L (ref 22–32)
Calcium: 11 mg/dL — ABNORMAL HIGH (ref 8.9–10.3)
Chloride: 101 mmol/L (ref 98–111)
Creatinine: 3.11 mg/dL (ref 0.61–1.24)
GFR, Estimated: 19 mL/min — ABNORMAL LOW (ref >60)
Glucose, Bld: 198 mg/dL — ABNORMAL HIGH (ref 70–99)
Potassium: 4.9 mmol/L (ref 3.5–5.1)
Sodium: 136 mmol/L (ref 135–145)
Total Bilirubin: 0.5 mg/dL (ref 0.3–1.2)
Total Protein: 7.2 g/dL (ref 6.5–8.1)

## 2021-01-25 LAB — LACTATE DEHYDROGENASE: LDH: 341 U/L — ABNORMAL HIGH (ref 98–192)

## 2021-01-25 LAB — URIC ACID: Uric Acid, Serum: 4.5 mg/dL (ref 3.7–8.6)

## 2021-01-25 NOTE — Progress Notes (Signed)
[place] [phone]   [phone]  PROGRESS NOTE  Patient Care Team: Gaynelle Arabian, MD as PCP - General (Family Medicine) Evans Lance, MD as PCP - Electrophysiology (Cardiology) [name], MD as PCP - Cardiology (Cardiology)  Hematological/Oncological History # High Grade B Cell Lymphoma, Stage III/IV 12/10/2020: CT Abdomen/Pelvis shows a soft tissue mass at 6.7 cm with soft tissue stranding.  12/14/2020: punch biopsy shows findings consistent with a high grade B cell lymphoma.  01/07/2021: establish care with Dr. Lorenso Courier  01/21/2021: PET CT scan shows diffuse disease with anterior abdominal wall lesion, pulmonary nodule, lesions along the cortex of the left/right kidney.   Interval History:  Rodney Lee 80 y.o. male with medical history significant for advanced stage high grade B cell lymphoma who presents for a follow up visit. The patient's last visit was on 01/07/2021 at which time he established care. In the interim since the last visit he had a PET CT scan which confirmed advanced stage disease.  On exam today Rodney Lee is accompanied by his wife and his son.  He reports in the interim since last visit lesion on his abdomen has grown larger and continues to be tender.  He fortunately not having any fevers, chills, sweats, nausea vomiting or weight loss.  He notes he is also not noticed any new lesions or lymphadenopathy since our prior visit.  A full 10 point ROS is listed below.  The bulk of our discussion focused on the diagnosis of a high grade B-cell lymphoma advanced age.  The details of this conversation are listed below.  MEDICAL HISTORY:  Past Medical History:  Diagnosis Date  . Atrial fibrillation ([place])    CHADS2Vasc is at least 5, on Xarelto  . CAD (coronary artery disease)   . cardiomyopathy    MDT ICD, Dr. Lovena Le 2009  . CHF (congestive heart failure) ([place])   . Diabetes mellitus   . Exogenous obesity   . Hyperlipidemia    . Hypertension   . Hypothyroidism   . MI, old [place], PTCA LAD per pt  . Sinus bradycardia     SURGICAL HISTORY: Past Surgical History:  Procedure Laterality Date  . ANGIOPLASTY    . CARDIAC CATHETERIZATION  03/13/1994   ACUTE MI WITH TOTAL OCCLUSION OF MID LAD. REPERFUSION AFTER CROSSING WITH A GUIDEWIRE. POOR RIGHT TO LEFT COLLATERAL FLOW. Pt states had PTCA  . CARDIAC DEFIBRILLATOR PLACEMENT    . CARDIOVASCULAR STRESS TEST  03/11/2008   EF 30%. NO REVERSIBLE ISCHEMIA  . EP IMPLANTABLE DEVICE N/A 09/28/2015   Procedure:  ICD Generator Changeout;  Surgeon: Evans Lance, MD;  Location: [facility];  Service: Cardiovascular;  Laterality: N/A;  . US ECHOCARDIOGRAPHY  04/26/2010   EF 35-40%. MODERATE LVH WITH MODERATE GLOBAL LV SYSTOLIC DYSFUNCTION AND IMPAIRED RELAXATION. MILD AORTIC SCLEROSIS WITHOUT STENOSIS. NORMAL PULMONARY ARTERY PRESSURE.  Marland Kitchen US ECHOCARDIOGRAPHY  10/07/2004   EF 30-35%    SOCIAL HISTORY: Social History   Socioeconomic History  . Marital status: Married    Spouse name: Not on file  . Number of children: Not on file  . Years of education: Not on file  . Highest education level: Not on file  Occupational History  . Occupation: Retired  Tobacco Use  . Smoking status: Never Smoker  . Smokeless tobacco: Never Used  Vaping Use  . Vaping Use: Never used  Substance and Sexual Activity  . Alcohol use: No  .  Drug use: No  . Sexual activity: Not on file  Other Topics Concern  . Not on file  Social History Narrative   Lives with wife   Social Determinants of Health   Financial Resource Strain: Not on file  Food Insecurity: Not on file  Transportation Needs: Not on file  Physical Activity: Not on file  Stress: Not on file  Social Connections: Not on file  Intimate Partner Violence: Not on file    FAMILY HISTORY: Family History  Problem Relation Age of Onset  . Heart disease Mother   . Heart attack Mother     ALLERGIES:  is  allergic to canagliflozin and lipitor [atorvastatin calcium].  MEDICATIONS:  Current Outpatient Medications  Medication Sig Dispense Refill  . amiodarone (PACERONE) 200 MG tablet TAKE 1 TABLET BY MOUTH  DAILY 90 tablet 2  . carvedilol (COREG) 6.25 MG tablet TAKE 1 TABLET BY MOUTH  TWICE DAILY WITH A MEAL 180 tablet 2  . citalopram (CELEXA) 20 MG tablet 1 tablet    . fexofenadine (ALLEGRA ALLERGY) 60 MG tablet 1 capsule    . glimepiride (AMARYL) 1 MG tablet Take 1 mg by mouth daily as needed.    . isosorbide mononitrate (IMDUR) 60 MG 24 hr tablet Take 60 mg in the morning and 30 mg (half a tablet) in the evening. 135 tablet 3  . levothyroxine (SYNTHROID, LEVOTHROID) 112 MCG tablet     . Multiple Vitamin (MULTIVITAMIN) tablet Take 1 tablet by mouth daily.    . nitroGLYCERIN (NITROSTAT) 0.4 MG SL tablet Place 1 tablet (0.4 mg total) under the tongue every 5 (five) minutes as needed for chest pain. 25 tablet 2  . repaglinide (PRANDIN) 1 MG tablet     . rosuvastatin (CRESTOR) 40 MG tablet Take 40 mg by mouth daily.    . vitamin C (ASCORBIC ACID) 500 MG tablet Take 500 mg by mouth daily.     No current facility-administered medications for this visit.    REVIEW OF SYSTEMS:   Constitutional: ( - ) fevers, ( - )  chills , ( - ) night sweats Eyes: ( - ) blurriness of vision, ( - ) double vision, ( - ) watery eyes Ears, nose, mouth, throat, and face: ( - ) mucositis, ( - ) sore throat Respiratory: ( - ) cough, ( - ) dyspnea, ( - ) wheezes Cardiovascular: ( - ) palpitation, ( - ) chest discomfort, ( - ) lower extremity swelling Gastrointestinal:  ( - ) nausea, ( - ) heartburn, ( - ) change in bowel habits Skin: ( - ) abnormal skin rashes Lymphatics: ( - ) new lymphadenopathy, ( - ) easy bruising Neurological: ( - ) numbness, ( - ) tingling, ( - ) new weaknesses Behavioral/Psych: ( - ) mood change, ( - ) new changes  All other systems were reviewed with the patient and are negative.  PHYSICAL  EXAMINATION: ECOG PERFORMANCE STATUS: 2 - Symptomatic, <50% confined to bed  Vitals:   01/25/21 1039  BP: (!) 94/54  Pulse: 82  Resp: 17  Temp: 98.5 F (36.9 C)  SpO2: 98%   Filed Weights   01/25/21 1039  Weight: 191 lb 14.4 oz (87 kg)    GENERAL: well appearing elderly Caucasian male. alert, no distress and comfortable SKIN: skin color, texture, turgor are normal, no rashes or significant lesions EYES: conjunctiva are pink and non-injected, sclera clear LUNGS: clear to auscultation and percussion with normal breathing effort HEART: regular rate & rhythm  and no murmurs and no lower extremity edema ABDOMEN: Large left-sided lesion with some necrosis and drainage, but no overt signs of infection.  Consistent with lymphoma noted on PET CT scan. Musculoskeletal: no cyanosis of digits and no clubbing  PSYCH: alert & oriented x 3, fluent speech NEURO: no focal motor/sensory deficits  LABORATORY DATA:  I have reviewed the data as listed CBC Latest Ref Rng & Units 01/25/2021 01/07/2021 03/01/2017  WBC 4.0 - 10.5 K/uL 4.9 5.0 7.7  Hemoglobin 13.0 - 17.0 g/dL 11.0(L) 11.4(L) 16.3  Hematocrit 39.0 - 52.0 % 34.0(L) 35.0(L) 48.5  Platelets 150 - 400 K/uL 76(L) 85(L) 122(L)    CMP Latest Ref Rng & Units 01/25/2021 01/07/2021 08/16/2019  Glucose 70 - 99 mg/dL 198(H) 127(H) 198(H)  BUN 8 - 23 mg/dL 47(H) 41(H) 47(H)  Creatinine 0.61 - 1.24 mg/dL 3.11(HH) 3.21(HH) 3.23(H)  Sodium 135 - 145 mmol/L 136 137 140  Potassium 3.5 - 5.1 mmol/L 4.9 4.7 4.7  Chloride 98 - 111 mmol/L 101 102 104  CO2 22 - 32 mmol/L 27 26 20   Calcium 8.9 - 10.3 mg/dL 11.0(H) 9.5 8.8  Total Protein 6.5 - 8.1 g/dL 7.2 7.7 7.0  Total Bilirubin 0.3 - 1.2 mg/dL 0.5 0.5 0.3  Alkaline Phos 38 - 126 U/L 64 78 90  AST 15 - 41 U/L 47(H) 41 23  ALT 0 - 44 U/L 27 22 17     RADIOGRAPHIC STUDIES: I have personally reviewed the radiological images as listed and agreed with the findings in the report: involvement of the left  abdomen, the kidney's bilaterally, and pulmonary nodule.   NM PET Image Initial (PI) Skull Base To Thigh  Result Date: 01/22/2021 CLINICAL DATA:  Initial treatment strategy for high-grade B-cell lymphoma. EXAM: NUCLEAR MEDICINE PET SKULL BASE TO THIGH TECHNIQUE: 9.8 mCi F-18 FDG was injected intravenously. Full-ring PET imaging was performed from the skull base to thigh after the radiotracer. CT data was obtained and used for attenuation correction and anatomic localization. Fasting blood glucose: 108 mg/dl COMPARISON:  CT abdomen 12/09/2020 FINDINGS: Mediastinal blood pool activity: SUV max 2.8 Liver activity: SUV max 3.5 NECK: Within the skin overlying the RIGHT occipital bone there is a subcutaneous ovoid lesion measuring 20 mm with intense metabolic activity (SUV max equal 35). Intense hypermetabolic activity is localizing to the posterior oropharynx midline with SUV max equal 35.1 (image 22) Incidental CT findings: none CHEST: Within the anterior mediastinum small paratracheal node on the LEFT at the level of the clavicle head measures 9 mm with SUV max equal 22.3 (image 57) In teh middle mediastinum just medial to the bronchus intermedius and adjacent to the esophagus is a soft tissue nodule measuring 19 mm on image 79 with SUV max equal 47.2. LEFT lower lobe nodule measuring 6 mm (image 88) with SUV max equal 6.1. Incidental CT findings: none ABDOMEN/PELVIS: Within the anterior abdominal wall LEFT of midline intensely hypermetabolic mass is increased in size compared to recent CT measuring 9.9 x 6.6 cm compared to 6.6 by 4.1 cm. Mass is intensely hypermetabolic SUV max equal 51. There is a new lesion along the lateral margin of the LEFT kidney measuring 2.1 cm (image 126) with SUV max equal 31.7. Second lesion extends from the upper pole of the LEFT kidney with SUV max equal 29 and measuring approximately 28 mm. Smaller lesions associated with the LEFT kidney on image 124 and similar lesion the posterior  RIGHT kidney on the same image measures approximately 1 cm each.  Lesion difficult define on the CT portion exam. Incidental CT findings: none SKELETON: Incidental CT findings: none IMPRESSION: 1. Unusual pattern of intensely hypermetabolic lymphoma lesions. Lesions have aggressive feature with rapid enlargement from CT 12/09/2020 and intense hypermetabolic activity. 2. Large hypermetabolic mass in the anterior abdominal wall LEFT of midline intense metabolic activity (SUV max equal 50) increased in size from 12/09/2020. 3. Subcutaneous nodule in the posterior RIGHT scalp as well as hypermetabolic focus in the posterior oropharynx. 4. Single hypermetabolic LEFT lobe pulmonary nodule. Hypermetabolic nodes within the anterior mediastinum and middle mediastinum. 5. New intensely hypermetabolic lesions along the cortex of the LEFT and RIGHT kidney. Larger lesions in the LEFT kidney with intense radiotracer activity. Electronically Signed   By: Suzy Bouchard M.D.   On: 01/22/2021 13:03    ASSESSMENT & PLAN Rodney Lee 80 y.o. male with medical history significant for advanced stage high grade B cell lymphoma who presents for a follow up visit.   Given Rodney Lee's complicated medical history with cardiac dysfunction, renal dysfunction, and advanced age I am concerned that he would not be able to tolerate anthracycline-based therapy such as R mini CHOP.  As such the best regimen to proceed with given his comorbidities would be R-GCVP which is not toxic to the heart or filter to the kidney and can be used in patients with advanced age.  At this time we recommend proceeding with R-GCVP chemotherapy given his advanced age, cardiac dysfunction, and poor renal function.  Discussed the risks and benefits of treatment including nausea, vomiting, diarrhea, neuropathy, and allergic reactions.  Patient has an IPI showing he has high risk with lower rates of success with these treatment regimens.  Patient voices  understanding of the risks and benefits of treatment was willing to proceed.  21 day cycle x 6 cycles. Rituximab 375mg /m2 IV on Day 1, Cyclophosphamide 750 mg /m2 on Day 1, Vincristine 1.4mg /m2 (capped at 2mg ) on Day 1, and Prednisone 60mg  Day 1-5 (protocol calls for prednisolone, but will adjust to lower dose prednisone). The Gemcitabine is to be administered on Day 1-8 and will be 750 mg/m2 IV over 30 minutes once per day on days 1 & 8 for Cycle 1, 875 mg/m2 IV over 30 minutes once per day on days 1 & 8 for Cycle 2, and 1000 mg/m2 IV over 30 minutes once per day on days 1 & 8 for Cycle 3 and onward.   # High Grade B Cell Lymphoma, Stage III/IV --PET CT scan findings consistent with at least Stage III disease, with organ involvement most likely a Stage IV --plan to proceed with R-GCVP once port is placed --avoiding anthracycline therapy and alternative regimens given his poor cardiac and renal function -- plan for Cycle 1 Day 1 of R-GCVP to start next week.   #Supportive Care -- chemotherapy education to be scheduled  -- port placement later this week  -- zofran 8mg  q8H PRN and compazine 10mg  PO q6H for nausea -- allopurinol 300mg  PO daily for TLS prophylaxis -- EMLA cream for port -- no pain medication required at this time.   No orders of the defined types were placed in this encounter.   All questions were answered. The patient knows to call the clinic with any problems, questions or concerns.  A total of more than 30 minutes were spent on this encounter with face-to-face time and non-face-to-face time, including preparing to see the patient, ordering tests and/or medications, counseling the patient and coordination of care  as outlined above.   Ledell Peoples, MD Department of Hematology/Oncology [place] [facility] Phone: [phone] Pager: [phone] Email: [email or web address]  01/25/2021 10:51 AM

## 2021-01-25 NOTE — Telephone Encounter (Signed)
CRITICAL VALUE STICKER  CRITICAL VALUE: Creatinine = 3.11  RECEIVER (on-site recipient of call): Yetta Glassman, [place] NOTIFIED: 01/25/21 at 10:45am  MESSENGER (representative from lab): Ulice Dash  MD NOTIFIED: Dr. Lorenso Courier  TIME OF NOTIFICATION: 01/25/21 at 10:50am  RESPONSE: Notification given directly to Dr. Lorenso Courier for follow-up with the pt.

## 2021-01-26 ENCOUNTER — Encounter: Payer: Self-pay | Admitting: Hematology and Oncology

## 2021-01-26 DIAGNOSIS — C8510 Unspecified B-cell lymphoma, unspecified site: Secondary | ICD-10-CM | POA: Insufficient documentation

## 2021-01-26 MED ORDER — PREDNISONE 20 MG PO TABS: 60.0000 mg | ORAL_TABLET | ORAL | 5 refills | Status: DC

## 2021-01-26 MED ORDER — ONDANSETRON HCL 8 MG PO TABS: 8.0000 mg | ORAL_TABLET | Freq: Three times a day (TID) | ORAL | 0 refills | Status: DC | PRN

## 2021-01-26 MED ORDER — LIDOCAINE-PRILOCAINE 2.5-2.5 % EX CREA: 1.0000 "application " | TOPICAL_CREAM | CUTANEOUS | 0 refills | Status: DC | PRN

## 2021-01-26 MED ORDER — PROCHLORPERAZINE MALEATE 10 MG PO TABS: 10.0000 mg | ORAL_TABLET | Freq: Four times a day (QID) | ORAL | 0 refills | Status: DC | PRN

## 2021-01-26 MED ORDER — ALLOPURINOL 300 MG PO TABS: 300.0000 mg | ORAL_TABLET | Freq: Every day | ORAL | 1 refills | Status: AC

## 2021-01-26 NOTE — Progress Notes (Signed)
START OFF PATHWAY REGIMEN - Lymphoma and CLL   [id number]:R-GCVP (Rituximab + Gemcitabine + Cyclophosphamide + Vincristine + Prednisolone + G-CSF) q21 Days x 6 Cycles:   Cycle 1: A cycle is 21 days:     Prednisolone      Rituximab-xxxx      Cyclophosphamide      Vincristine      Gemcitabine      Pegfilgrastim-xxxx    Cycle 2: A cycle is 21 days:     Prednisolone      Rituximab-xxxx      Cyclophosphamide      Vincristine      Gemcitabine      Pegfilgrastim-xxxx    Cycles 3 through 6: A cycle is every 21 days:     Prednisolone      Rituximab-xxxx      Cyclophosphamide      Vincristine      Gemcitabine      Pegfilgrastim-xxxx   **Always confirm dose/schedule in your pharmacy ordering system**  Patient Characteristics: High Grade Lymphoma (such as [place]), Treating as Specialist or Consulting with Specialist Disease Type: Not Applicable Disease Type: High Grade Lymphoma (such as [place]) Disease Type: Not Applicable Please indicate whether you are: A specialist Intent of Therapy: Curative Intent, Discussed with Patient

## 2021-01-27 ENCOUNTER — Other Ambulatory Visit: Payer: Self-pay | Admitting: Student

## 2021-01-28 ENCOUNTER — Encounter: Payer: Self-pay | Admitting: Hematology and Oncology

## 2021-01-28 ENCOUNTER — Encounter (HOSPITAL_COMMUNITY): Payer: Self-pay

## 2021-01-28 ENCOUNTER — Other Ambulatory Visit: Payer: Self-pay

## 2021-01-28 ENCOUNTER — Ambulatory Visit (HOSPITAL_COMMUNITY)
Admission: RE | Admit: 2021-01-28 | Discharge: 2021-01-28 | Disposition: A | Discharge status: Home / Self Care | Payer: Medicare Other | Source: Ambulatory Visit | Attending: Hematology and Oncology | Admitting: Hematology and Oncology

## 2021-01-28 DIAGNOSIS — Z7984 Long term (current) use of oral hypoglycemic drugs: Secondary | ICD-10-CM | POA: Diagnosis not present

## 2021-01-28 DIAGNOSIS — Z881 Allergy status to other antibiotic agents status: Secondary | ICD-10-CM | POA: Insufficient documentation

## 2021-01-28 DIAGNOSIS — C8510 Unspecified B-cell lymphoma, unspecified site: Secondary | ICD-10-CM

## 2021-01-28 DIAGNOSIS — I10 Essential (primary) hypertension: Secondary | ICD-10-CM | POA: Insufficient documentation

## 2021-01-28 DIAGNOSIS — Z79890 Hormone replacement therapy (postmenopausal): Secondary | ICD-10-CM | POA: Diagnosis not present

## 2021-01-28 DIAGNOSIS — I4891 Unspecified atrial fibrillation: Secondary | ICD-10-CM | POA: Diagnosis not present

## 2021-01-28 DIAGNOSIS — Z452 Encounter for adjustment and management of vascular access device: Secondary | ICD-10-CM | POA: Diagnosis not present

## 2021-01-28 DIAGNOSIS — I2510 Atherosclerotic heart disease of native coronary artery without angina pectoris: Secondary | ICD-10-CM | POA: Diagnosis not present

## 2021-01-28 DIAGNOSIS — Z888 Allergy status to other drugs, medicaments and biological substances status: Secondary | ICD-10-CM | POA: Insufficient documentation

## 2021-01-28 DIAGNOSIS — E119 Type 2 diabetes mellitus without complications: Secondary | ICD-10-CM | POA: Insufficient documentation

## 2021-01-28 DIAGNOSIS — Z79899 Other long term (current) drug therapy: Secondary | ICD-10-CM | POA: Insufficient documentation

## 2021-01-28 DIAGNOSIS — C8590 Non-Hodgkin lymphoma, unspecified, unspecified site: Secondary | ICD-10-CM | POA: Diagnosis not present

## 2021-01-28 DIAGNOSIS — Z7901 Long term (current) use of anticoagulants: Secondary | ICD-10-CM | POA: Insufficient documentation

## 2021-01-28 DIAGNOSIS — E785 Hyperlipidemia, unspecified: Secondary | ICD-10-CM | POA: Insufficient documentation

## 2021-01-28 HISTORY — PX: IR IMAGING GUIDED PORT INSERTION: IMG5740

## 2021-01-28 LAB — GLUCOSE, CAPILLARY
Glucose-Capillary: 149 mg/dL — ABNORMAL HIGH (ref 70–99)
Glucose-Capillary: 65 mg/dL — ABNORMAL LOW (ref 70–99)

## 2021-01-28 MED ORDER — LIDOCAINE-EPINEPHRINE 1 %-1:100000 IJ SOLN
INTRAMUSCULAR | Status: AC | PRN
  Administered 2021-01-28: 10 mL

## 2021-01-28 MED ORDER — LIDOCAINE-EPINEPHRINE 1 %-1:100000 IJ SOLN
INTRAMUSCULAR | Status: AC
  Filled 2021-01-28: qty 1

## 2021-01-28 MED ORDER — HEPARIN SOD (PORK) LOCK FLUSH 100 UNIT/ML IV SOLN
INTRAVENOUS | Status: AC
  Filled 2021-01-28: qty 5

## 2021-01-28 MED ORDER — DEXTROSE 50 % IV SOLN
12.5000 g | INTRAVENOUS | Status: AC
  Administered 2021-01-28: 12.5 g via INTRAVENOUS

## 2021-01-28 MED ORDER — SODIUM CHLORIDE 0.9 % IV SOLN: INTRAVENOUS | Status: DC

## 2021-01-28 MED ORDER — FENTANYL CITRATE (PF) 100 MCG/2ML IJ SOLN
INTRAMUSCULAR | Status: AC
  Filled 2021-01-28: qty 2

## 2021-01-28 MED ORDER — FENTANYL CITRATE (PF) 100 MCG/2ML IJ SOLN
INTRAMUSCULAR | Status: AC | PRN
  Administered 2021-01-28 (×2): 25 ug via INTRAVENOUS
  Administered 2021-01-28: 50 ug via INTRAVENOUS

## 2021-01-28 MED ORDER — DEXTROSE 50 % IV SOLN
INTRAVENOUS | Status: AC
  Filled 2021-01-28: qty 50

## 2021-01-28 MED ORDER — MIDAZOLAM HCL 2 MG/2ML IJ SOLN
INTRAMUSCULAR | Status: AC | PRN
  Administered 2021-01-28 (×2): 0.5 mg via INTRAVENOUS
  Administered 2021-01-28: 1 mg via INTRAVENOUS

## 2021-01-28 MED ORDER — MIDAZOLAM HCL 2 MG/2ML IJ SOLN
INTRAMUSCULAR | Status: AC
  Filled 2021-01-28: qty 2

## 2021-01-28 MED ORDER — HEPARIN SOD (PORK) LOCK FLUSH 100 UNIT/ML IV SOLN
INTRAVENOUS | Status: AC | PRN
  Administered 2021-01-28: 500 [IU] via INTRAVENOUS

## 2021-01-28 NOTE — Discharge Instructions (Signed)
Interventional radiology phone numbers [phone] After hours [phone]  DO NOT SHOWER x 24 hrs    You have skin glue (dermabond) over your new port. Do not use the lidocaine cream (EMLA cream) over the skin glue until it has healed. The petroleum in the lidocaine cream will dissolve the skin glue resulting in an infection of your new port. Use ice in a zip lock bag for 1-2 minutes over your new port before the cancer center nurses access your port.   Implanted Port Insertion, Care After This sheet gives you information about how to care for yourself after your procedure. Your health care provider may also give you more specific instructions. If you have problems or questions, contact your health care provider. What can I expect after the procedure? After the procedure, it is common to have:  Discomfort at the port insertion site.  Bruising on the skin over the port. This should improve over 3-4 days. Follow these instructions at home: [facility] care  After your port is placed, you will get a manufacturer's information card. The card has information about your port. Keep this card with you at all times.  Take care of the port as told by your health care provider. Ask your health care provider if you or a family member can get training for taking care of the port at home. A home health care nurse may also take care of the port.  Make sure to remember what type of port you have. Incision care 1. Follow instructions from your health care provider about how to take care of your port insertion site. Make sure you: ? Wash your hands with soap and water before and after you change your bandage (dressing). If soap and water are not available, use hand sanitizer. ? Change your dressing as told by your health care provider. 2. Leave skin glue in place. These skin closures may need to stay in place for 2 weeks or longer.  3. Check your port insertion site every day for signs of infection. Check  for: ? Redness, swelling, or pain. ? Fluid or blood. ? Warmth. ? Pus or a bad smell.      Activity  Return to your normal activities as told by your health care provider. Ask your health care provider what activities are safe for you.  Do not lift anything that is heavier than 10 lb (4.5 kg), or the limit that you are told, until your health care provider says that it is safe. General instructions  Take over-the-counter and prescription medicines only as told by your health care provider.  Do not take baths, swim, or use a hot tub until your health care provider approves.You may remove your dressing tomorrow and shower 24 hours after your procedure.  Do not drive for 24 hours if you were given a sedative during your procedure.  Wear a medical alert bracelet in case of an emergency. This will tell any health care providers that you have a port.  Keep all follow-up visits as told by your health care provider. This is important. Contact a health care provider if:  You cannot flush your port with saline as directed, or you cannot draw blood from the port.  You have a fever or chills.  You have redness, swelling, or pain around your port insertion site.  You have fluid or blood coming from your port insertion site.  Your port insertion site feels warm to the touch.  You have pus or a bad  smell coming from the port insertion site. Get help right away if:  You have chest pain or shortness of breath.  You have bleeding from your port that you cannot control. Summary  Take care of the port as told by your health care provider. Keep the manufacturer's information card with you at all times.  Change your dressing as told by your health care provider.  Contact a health care provider if you have a fever or chills or if you have redness, swelling, or pain around your port insertion site.  Keep all follow-up visits as told by your health care provider. This information is not  intended to replace advice given to you by your health care provider. Make sure you discuss any questions you have with your health care provider. Document Revised: 03/20/2018 Document Reviewed: 03/20/2018 Elsevier Patient Education  2021 [place].    Moderate Conscious Sedation, Adult, Care After This sheet gives you information about how to care for yourself after your procedure. Your health care provider may also give you more specific instructions. If you have problems or questions, contact your health care provider. What can I expect after the procedure? After the procedure, it is common to have:  Sleepiness for several hours.  Impaired judgment for several hours.  Difficulty with balance.  Vomiting if you eat too soon. Follow these instructions at home: For the time period you were told by your health care provider:  Rest.  Do not participate in activities where you could fall or become injured.  Do not drive or use machinery.  Do not drink alcohol.  Do not take sleeping pills or medicines that cause drowsiness.  Do not make important decisions or sign legal documents.  Do not take care of children on your own.      Eating and drinking 4. Follow the diet recommended by your health care provider. 5. Drink enough fluid to keep your urine pale yellow. 6. If you vomit: ? Drink water, juice, or soup when you can drink without vomiting. ? Make sure you have little or no nausea before eating solid foods.   General instructions  Take over-the-counter and prescription medicines only as told by your health care provider.  Have a responsible adult stay with you for the time you are told. It is important to have someone help care for you until you are awake and alert.  Do not smoke.  Keep all follow-up visits as told by your health care provider. This is important. Contact a health care provider if:  You are still sleepy or having trouble with balance after 24  hours.  You feel light-headed.  You keep feeling nauseous or you keep vomiting.  You develop a rash.  You have a fever.  You have redness or swelling around the IV site. Get help right away if:  You have trouble breathing.  You have new-onset confusion at home. Summary  After the procedure, it is common to feel sleepy, have impaired judgment, or feel nauseous if you eat too soon.  Rest after you get home. Know the things you should not do after the procedure.  Follow the diet recommended by your health care provider and drink enough fluid to keep your urine pale yellow.  Get help right away if you have trouble breathing or new-onset confusion at home. This information is not intended to replace advice given to you by your health care provider. Make sure you discuss any questions you have with your health care  provider. Document Revised: 12/20/2019 Document Reviewed: 07/18/2019 Elsevier Patient Education  2021 [employer].

## 2021-01-28 NOTE — Procedures (Signed)
Pre Procedure Dx: Poor venous access Post Procedural Dx: Same  Successful placement of right IJ approach port-a-cath with tip at the superior caval atrial junction. The catheter is ready for immediate use.  Estimated Blood Loss: Minimal  Complications: None immediate.  Jay Deronda Christian, MD Pager #: [phone]   

## 2021-01-28 NOTE — H&P (Signed)
Chief Complaint: Patient was seen in consultation today for lymphoma/Port-a-cath placement.  Referring Physician(s): Dorsey,John T IV (oncology)  Supervising Physician: Sandi Mariscal  Patient Status: [facility] - Out-pt  History of Present Illness: Rodney Lee is a 80 y.o. male with a past medical history of hypertension, hyperlipidemia, sinus bradycardia, atrial fibrillation on chronic anticoagulation with Eliquis, CAD, cardiomyopathy, MI 1995, HF, hypothyroidism, diabetes mellitus, B-cell lymphoma, and obesity. He was unfortunately diagnosed with high-grade B-cell lymphoma stage III/IV in 12/2020. His cancer is managed by Dr. Lorenso Courier. He has tentative plans to begin systemic chemotherapy as management.  IR consulted by Dr. Lorenso Courier for possible image-guided Port-a-cath placement. Patient awake and alert laying in bed. States he is hungry. Complains of abdominal pain to palpation of abdominal mass (luymphoma)- denies additional abdominal pain/pain of mass without palpation. Denies fever, chills, chest pain, dyspnea, or headache.   Past Medical History:  Diagnosis Date  . Atrial fibrillation ([place])    CHADS2Vasc is at least 5, on Xarelto  . CAD (coronary artery disease)   . cardiomyopathy    MDT ICD, Dr. Lovena Le 2009  . CHF (congestive heart failure) ([place])   . Diabetes mellitus   . Exogenous obesity   . Hyperlipidemia   . Hypertension   . Hypothyroidism   . MI, old [place], PTCA LAD per pt  . Sinus bradycardia     Past Surgical History:  Procedure Laterality Date  . ANGIOPLASTY    . CARDIAC CATHETERIZATION  03/13/1994   ACUTE MI WITH TOTAL OCCLUSION OF MID LAD. REPERFUSION AFTER CROSSING WITH A GUIDEWIRE. POOR RIGHT TO LEFT COLLATERAL FLOW. Pt states had PTCA  . CARDIAC DEFIBRILLATOR PLACEMENT    . CARDIOVASCULAR STRESS TEST  03/11/2008   EF 30%. NO REVERSIBLE ISCHEMIA  . EP IMPLANTABLE DEVICE N/A 09/28/2015   Procedure:  ICD Generator Changeout;  Surgeon: Evans Lance, MD;  Location: [place] [facility];  Service: Cardiovascular;  Laterality: N/A;  . US ECHOCARDIOGRAPHY  04/26/2010   EF 35-40%. MODERATE LVH WITH MODERATE GLOBAL LV SYSTOLIC DYSFUNCTION AND IMPAIRED RELAXATION. MILD AORTIC SCLEROSIS WITHOUT STENOSIS. NORMAL PULMONARY ARTERY PRESSURE.  Marland Kitchen US ECHOCARDIOGRAPHY  10/07/2004   EF 30-35%    Allergies: Canagliflozin and Lipitor [atorvastatin calcium]  Medications: Prior to Admission medications   Medication Sig Start Date End Date Taking? Authorizing Provider  allopurinol (ZYLOPRIM) 300 MG tablet Take 1 tablet (300 mg total) by mouth daily. 01/26/21   Orson Slick, MD  amiodarone (PACERONE) 200 MG tablet TAKE 1 TABLET BY MOUTH  DAILY 01/18/21   Croitoru, Mihai, MD  carvedilol (COREG) 6.25 MG tablet TAKE 1 TABLET BY MOUTH  TWICE DAILY WITH A MEAL 02/26/20   Croitoru, Mihai, MD  citalopram (CELEXA) 20 MG tablet 1 tablet    [provider]  fexofenadine (ALLEGRA ALLERGY) 60 MG tablet 1 capsule 11/10/20   [provider]  glimepiride (AMARYL) 1 MG tablet Take 1 mg by mouth daily as needed. 05/26/17   [provider]  isosorbide mononitrate (IMDUR) 60 MG 24 hr tablet Take 60 mg in the morning and 30 mg (half a tablet) in the evening. 09/03/20   Croitoru, Mihai, MD  levothyroxine (SYNTHROID) 125 MCG tablet Take 125 mcg by mouth daily. 01/19/21   [provider]  lidocaine-prilocaine (EMLA) cream Apply 1 application topically as needed. 01/26/21   Orson Slick, MD  Multiple Vitamin (MULTIVITAMIN) tablet Take 1 tablet by mouth daily.    [provider]  nitroGLYCERIN (NITROSTAT) 0.4 MG SL tablet Place 1 tablet (0.4 mg total) under the tongue every 5 (five) minutes as needed for chest pain. 09/03/20   Croitoru, Mihai, MD  ondansetron (ZOFRAN) 8 MG tablet Take 1 tablet (8 mg total) by mouth every 8 (eight) hours as needed for nausea or vomiting. 01/26/21   Orson Slick, MD  predniSONE (DELTASONE) 20 MG  tablet Take 3 tablets (60 mg total) by mouth as directed. Take 60 mg  ( 3 tablets) by mouth every morning on Days 1 to 5 of chemotherapy. 01/26/21   Orson Slick, MD  prochlorperazine (COMPAZINE) 10 MG tablet Take 1 tablet (10 mg total) by mouth every 6 (six) hours as needed for nausea or vomiting. 01/26/21   Orson Slick, MD  repaglinide (PRANDIN) 1 MG tablet  08/04/19   [provider]  rosuvastatin (CRESTOR) 40 MG tablet Take 40 mg by mouth daily.    Gaynelle Arabian, MD  vitamin C (ASCORBIC ACID) 500 MG tablet Take 500 mg by mouth daily.    [provider]     Family History  Problem Relation Age of Onset  . Heart disease Mother   . Heart attack Mother     Social History   Socioeconomic History  . Marital status: Married    Spouse name: Not on file  . Number of children: Not on file  . Years of education: Not on file  . Highest education level: Not on file  Occupational History  . Occupation: Retired  Tobacco Use  . Smoking status: Never Smoker  . Smokeless tobacco: Never Used  Vaping Use  . Vaping Use: Never used  Substance and Sexual Activity  . Alcohol use: No  . Drug use: No  . Sexual activity: Not on file  Other Topics Concern  . Not on file  Social History Narrative   Lives with wife   Social Determinants of Health   Financial Resource Strain: Not on file  Food Insecurity: Not on file  Transportation Needs: Not on file  Physical Activity: Not on file  Stress: Not on file  Social Connections: Not on file     Review of Systems: A 12 point ROS discussed and pertinent positives are indicated in the HPI above.  All other systems are negative.  Review of Systems  Constitutional: Negative for chills and fever.  Respiratory: Negative for shortness of breath and wheezing.   Cardiovascular: Negative for chest pain and palpitations.  Gastrointestinal: Positive for abdominal pain.  Neurological: Negative for headaches.   Psychiatric/Behavioral: Negative for behavioral problems and confusion.    Vital Signs: There were no vitals taken for this visit.  Physical Exam Vitals and nursing note reviewed.  Constitutional:      General: He is not in acute distress.    Appearance: He is obese.  Cardiovascular:     Rate and Rhythm: Normal rate and regular rhythm.     Heart sounds: Normal heart sounds. No murmur heard.   Pulmonary:     Effort: Pulmonary effort is normal. No respiratory distress.     Breath sounds: Normal breath sounds. No wheezing.  Abdominal:     Palpations: Abdomen is soft.     Comments: Large left lower abdominal mass, tender to palpation, dressing c/d/i.  Skin:    General: Skin is warm and dry.  Neurological:     Mental Status: He is alert and oriented to person, place, and time.  MD Evaluation Airway: WNL Heart: WNL Abdomen: WNL Chest/ Lungs: WNL ASA  Classification: 3 Mallampati/Airway Score: Two   Imaging: NM PET Image Initial (PI) Skull Base To Thigh  Result Date: 01/22/2021 CLINICAL DATA:  Initial treatment strategy for high-grade B-cell lymphoma. EXAM: NUCLEAR MEDICINE PET SKULL BASE TO THIGH TECHNIQUE: 9.8 mCi F-18 FDG was injected intravenously. Full-ring PET imaging was performed from the skull base to thigh after the radiotracer. CT data was obtained and used for attenuation correction and anatomic localization. Fasting blood glucose: 108 mg/dl COMPARISON:  CT abdomen 12/09/2020 FINDINGS: Mediastinal blood pool activity: SUV max 2.8 Liver activity: SUV max 3.5 NECK: Within the skin overlying the RIGHT occipital bone there is a subcutaneous ovoid lesion measuring 20 mm with intense metabolic activity (SUV max equal 35). Intense hypermetabolic activity is localizing to the posterior oropharynx midline with SUV max equal 35.1 (image 22) Incidental CT findings: none CHEST: Within the anterior mediastinum small paratracheal node on the LEFT at the level of the clavicle  head measures 9 mm with SUV max equal 22.3 (image 57) In teh middle mediastinum just medial to the bronchus intermedius and adjacent to the esophagus is a soft tissue nodule measuring 19 mm on image 79 with SUV max equal 47.2. LEFT lower lobe nodule measuring 6 mm (image 88) with SUV max equal 6.1. Incidental CT findings: none ABDOMEN/PELVIS: Within the anterior abdominal wall LEFT of midline intensely hypermetabolic mass is increased in size compared to recent CT measuring 9.9 x 6.6 cm compared to 6.6 by 4.1 cm. Mass is intensely hypermetabolic SUV max equal 51. There is a new lesion along the lateral margin of the LEFT kidney measuring 2.1 cm (image 126) with SUV max equal 31.7. Second lesion extends from the upper pole of the LEFT kidney with SUV max equal 29 and measuring approximately 28 mm. Smaller lesions associated with the LEFT kidney on image 124 and similar lesion the posterior RIGHT kidney on the same image measures approximately 1 cm each. Lesion difficult define on the CT portion exam. Incidental CT findings: none SKELETON: Incidental CT findings: none IMPRESSION: 1. Unusual pattern of intensely hypermetabolic lymphoma lesions. Lesions have aggressive feature with rapid enlargement from CT 12/09/2020 and intense hypermetabolic activity. 2. Large hypermetabolic mass in the anterior abdominal wall LEFT of midline intense metabolic activity (SUV max equal 50) increased in size from 12/09/2020. 3. Subcutaneous nodule in the posterior RIGHT scalp as well as hypermetabolic focus in the posterior oropharynx. 4. Single hypermetabolic LEFT lobe pulmonary nodule. Hypermetabolic nodes within the anterior mediastinum and middle mediastinum. 5. New intensely hypermetabolic lesions along the cortex of the LEFT and RIGHT kidney. Larger lesions in the LEFT kidney with intense radiotracer activity. Electronically Signed   By: Suzy Bouchard M.D.   On: 01/22/2021 13:03    Labs:  CBC: Recent Labs     01/07/21 1513 01/25/21 0952  WBC 5.0 4.9  HGB 11.4* 11.0*  HCT 35.0* 34.0*  PLT 85* 76*    COAGS: No results for input(s): INR, APTT in the last 8760 hours.  BMP: Recent Labs    01/07/21 1513 01/25/21 0952  NA 137 136  K 4.7 4.9  CL 102 101  CO2 26 27  GLUCOSE 127* 198*  BUN 41* 47*  CALCIUM 9.5 11.0*  CREATININE 3.21* 3.11*  GFRNONAA 19* 19*    LIVER FUNCTION TESTS: Recent Labs    01/07/21 1513 01/25/21 0952  BILITOT 0.5 0.5  AST 41 47*  ALT 22 27  ALKPHOS 78 64  PROT 7.7 7.2  ALBUMIN 3.6 3.4*     Assessment and Plan:  High-grade B-cell lymphoma stage III/IV with tentative plans to begin systemic chemotherapy as management, in need of durable IV access for administration. Plan for image-guided Port-a-cath placement today in IR. Patient is NPO. Afebrile.  Risks and benefits of image-guided Port-a-catheter placement were discussed with the patient including, but not limited to bleeding, infection, pneumothorax, or fibrin sheath development and need for additional procedures. All of the patient's questions were answered, patient is agreeable to proceed. Consent signed and in chart.   Thank you for this interesting consult.  I greatly enjoyed meeting Rodney Lee and look forward to participating in their care.  A copy of this report was sent to the requesting provider on this date.  Electronically Signed: Earley Abide, PA-C 01/28/2021, 10:33 AM   I spent a total of 15 Minutes in face to face in clinical consultation, greater than 50% of which was counseling/coordinating care for lymphoma/Port-a-cath placement.

## 2021-01-28 NOTE — Progress Notes (Signed)
Called pt's wife that procedure for PAC placement is running 50 minutes behind schedule. She verbalizes understanding.

## 2021-01-28 NOTE — Progress Notes (Signed)
Called pt to introduce myself as his Arboriculturist.  Unfortunately there aren't any foundations offering copay assistance for his Dx and the type of ins he has.  I spoke to pt's wife and informed her of the [employer], went over what it covers and gave her the income requirement.  They would like to apply so they will bring their proof of income on 02/03/21.  If approved I will give him an expense sheet and my card for any questions or concerns he may have in the future.

## 2021-01-29 ENCOUNTER — Telehealth: Payer: Self-pay | Admitting: *Deleted

## 2021-01-29 NOTE — Telephone Encounter (Signed)
TCT patient's wife regarding his upcoming appts as there are several.  Spoke with Hassan Rowan and we reviewed his appts for the next 2 weeks. She voiced understanding and was able to read back the appropriate dates and times for his che\hemo/lbas and MD appts.  Advised that they would receive a calendar for these next week.

## 2021-02-02 ENCOUNTER — Inpatient Hospital Stay: Payer: Medicare Other

## 2021-02-02 ENCOUNTER — Other Ambulatory Visit: Payer: Self-pay | Admitting: Hematology and Oncology

## 2021-02-02 ENCOUNTER — Other Ambulatory Visit: Payer: Self-pay

## 2021-02-02 DIAGNOSIS — C8510 Unspecified B-cell lymphoma, unspecified site: Secondary | ICD-10-CM

## 2021-02-02 NOTE — Progress Notes (Signed)
[place] Telephone:([phone]) [phone]   Fax:([phone]) [phone]  PROGRESS NOTE  Patient Care Team: Gaynelle Arabian, MD as PCP - General (Family Medicine) Evans Lance, MD as PCP - Electrophysiology (Cardiology) [name], MD as PCP - Cardiology (Cardiology)  Hematological/Oncological History # High Grade B Cell Lymphoma, Stage III/IV 12/10/2020: CT Abdomen/Pelvis shows a soft tissue mass at 6.7 cm with soft tissue stranding.  12/14/2020: punch biopsy shows findings consistent with a high grade B cell lymphoma.  01/07/2021: establish care with Dr. Lorenso Courier  01/21/2021: PET CT scan shows diffuse disease with anterior abdominal wall lesion, pulmonary nodule, lesions along the cortex of the left/right kidney.  02/04/2021: Cycle 1 Day 1 of R-GCVP  Interval History:  Rodney Lee 80 y.o. male with medical history significant for advanced stage high grade B cell lymphoma who presents for a follow up visit. The patient's last visit was on 01/26/2021. In the interim since the last visit he had his port placed and completed chemotherapy education.   On exam today Rodney Lee is accompanied by his wife.  He reports that the lesion on his abdomen has continued to grow.  Fortunately has not had any purulent discharge or bleeding.  He is also developed a new lesion on the back of his head which is about the size of a half dollar and purple.  He has not had any issues with fevers, chills, sweats, nausea, vomiting or diarrhea.  He did not have any other questions or concerns today.  A full 10 point ROS is listed below.  The bulk of our discussion focused on assuring he had all the medications and information necessary to start chemotherapy today.    MEDICAL HISTORY:  Past Medical History:  Diagnosis Date  . Atrial fibrillation ([place])    CHADS2Vasc is at least 5, on Xarelto  . CAD (coronary artery disease)   . cardiomyopathy    MDT ICD, Dr. Lovena Le 2009  . CHF (congestive heart failure) ([place])    . Diabetes mellitus   . Exogenous obesity   . Hyperlipidemia   . Hypertension   . Hypothyroidism   . MI, old [place], PTCA LAD per pt  . Sinus bradycardia     SURGICAL HISTORY: Past Surgical History:  Procedure Laterality Date  . ANGIOPLASTY    . CARDIAC CATHETERIZATION  03/13/1994   ACUTE MI WITH TOTAL OCCLUSION OF MID LAD. REPERFUSION AFTER CROSSING WITH A GUIDEWIRE. POOR RIGHT TO LEFT COLLATERAL FLOW. Pt states had PTCA  . CARDIAC DEFIBRILLATOR PLACEMENT    . CARDIOVASCULAR STRESS TEST  03/11/2008   EF 30%. NO REVERSIBLE ISCHEMIA  . EP IMPLANTABLE DEVICE N/A 09/28/2015   Procedure:  ICD Generator Changeout;  Surgeon: Evans Lance, MD;  Location: [facility];  Service: Cardiovascular;  Laterality: N/A;  . IR IMAGING GUIDED PORT INSERTION  01/28/2021  . US ECHOCARDIOGRAPHY  04/26/2010   EF 35-40%. MODERATE LVH WITH MODERATE GLOBAL LV SYSTOLIC DYSFUNCTION AND IMPAIRED RELAXATION. MILD AORTIC SCLEROSIS WITHOUT STENOSIS. NORMAL PULMONARY ARTERY PRESSURE.  Marland Kitchen US ECHOCARDIOGRAPHY  10/07/2004   EF 30-35%    SOCIAL HISTORY: Social History   Socioeconomic History  . Marital status: Married    Spouse name: Not on file  . Number of children: Not on file  . Years of education: Not on file  . Highest education level: Not on file  Occupational History  . Occupation: Retired  Tobacco Use  . Smoking status: Never Smoker  . Smokeless  tobacco: Never Used  Vaping Use  . Vaping Use: Never used  Substance and Sexual Activity  . Alcohol use: No  . Drug use: No  . Sexual activity: Not on file  Other Topics Concern  . Not on file  Social History Narrative   Lives with wife   Social Determinants of Health   Financial Resource Strain: Not on file  Food Insecurity: Not on file  Transportation Needs: Not on file  Physical Activity: Not on file  Stress: Not on file  Social Connections: Not on file  Intimate Partner Violence: Not on file    FAMILY HISTORY: Family  History  Problem Relation Age of Onset  . Heart disease Mother   . Heart attack Mother     ALLERGIES:  is allergic to canagliflozin and lipitor [atorvastatin calcium].  MEDICATIONS:  Current Outpatient Medications  Medication Sig Dispense Refill  . allopurinol (ZYLOPRIM) 300 MG tablet Take 1 tablet (300 mg total) by mouth daily. 90 tablet 1  . amiodarone (PACERONE) 200 MG tablet TAKE 1 TABLET BY MOUTH  DAILY 90 tablet 2  . carvedilol (COREG) 6.25 MG tablet TAKE 1 TABLET BY MOUTH  TWICE DAILY WITH A MEAL 180 tablet 2  . citalopram (CELEXA) 20 MG tablet 1 tablet    . fexofenadine (ALLEGRA) 60 MG tablet 1 capsule    . glimepiride (AMARYL) 1 MG tablet Take 1 mg by mouth daily as needed.    . isosorbide mononitrate (IMDUR) 60 MG 24 hr tablet Take 60 mg in the morning and 30 mg (half a tablet) in the evening. 135 tablet 3  . levothyroxine (SYNTHROID) 125 MCG tablet Take 125 mcg by mouth daily.    Marland Kitchen lidocaine-prilocaine (EMLA) cream Apply 1 application topically as needed. 30 g 0  . Multiple Vitamin (MULTIVITAMIN) tablet Take 1 tablet by mouth daily.    . nitroGLYCERIN (NITROSTAT) 0.4 MG SL tablet Place 1 tablet (0.4 mg total) under the tongue every 5 (five) minutes as needed for chest pain. 25 tablet 2  . ondansetron (ZOFRAN) 8 MG tablet Take 1 tablet (8 mg total) by mouth every 8 (eight) hours as needed for nausea or vomiting. 30 tablet 0  . predniSONE (DELTASONE) 20 MG tablet Take 3 tablets (60 mg total) by mouth as directed. Take 60 mg  ( 3 tablets) by mouth every morning on Days 1 to 5 of chemotherapy. 15 tablet 5  . prochlorperazine (COMPAZINE) 10 MG tablet Take 1 tablet (10 mg total) by mouth every 6 (six) hours as needed for nausea or vomiting. 30 tablet 0  . repaglinide (PRANDIN) 1 MG tablet     . rosuvastatin (CRESTOR) 40 MG tablet Take 40 mg by mouth daily.    . vitamin C (ASCORBIC ACID) 500 MG tablet Take 500 mg by mouth daily.     No current facility-administered medications for  this visit.    REVIEW OF SYSTEMS:   Constitutional: ( - ) fevers, ( - )  chills , ( - ) night sweats Eyes: ( - ) blurriness of vision, ( - ) double vision, ( - ) watery eyes Ears, nose, mouth, throat, and face: ( - ) mucositis, ( - ) sore throat Respiratory: ( - ) cough, ( - ) dyspnea, ( - ) wheezes Cardiovascular: ( - ) palpitation, ( - ) chest discomfort, ( - ) lower extremity swelling Gastrointestinal:  ( - ) nausea, ( - ) heartburn, ( - ) change in bowel habits Skin: ( - )  abnormal skin rashes Lymphatics: ( - ) new lymphadenopathy, ( - ) easy bruising Neurological: ( - ) numbness, ( - ) tingling, ( - ) new weaknesses Behavioral/Psych: ( - ) mood change, ( - ) new changes  All other systems were reviewed with the patient and are negative.  PHYSICAL EXAMINATION: ECOG PERFORMANCE STATUS: 2 - Symptomatic, <50% confined to bed  There were no vitals filed for this visit. There were no vitals filed for this visit.  GENERAL: well appearing elderly Caucasian male. alert, no distress and comfortable SKIN: Half dollar sized purple lesion on the back on head. Otherwise skin color, texture, turgor are normal, no rashes or significant lesions EYES: conjunctiva are pink and non-injected, sclera clear LUNGS: clear to auscultation and percussion with normal breathing effort HEART: regular rate & rhythm and no murmurs and no lower extremity edema ABDOMEN: Large left-sided lesion with some necrosis and drainage, but no overt signs of infection.  Consistent with lymphoma noted on PET CT scan. Musculoskeletal: no cyanosis of digits and no clubbing  PSYCH: alert & oriented x 3, fluent speech NEURO: no focal motor/sensory deficits  LABORATORY DATA:  I have reviewed the data as listed CBC Latest Ref Rng & Units 01/25/2021 01/07/2021 03/01/2017  WBC 4.0 - 10.5 K/uL 4.9 5.0 7.7  Hemoglobin 13.0 - 17.0 g/dL 11.0(L) 11.4(L) 16.3  Hematocrit 39.0 - 52.0 % 34.0(L) 35.0(L) 48.5  Platelets 150 - 400 K/uL 76(L)  85(L) 122(L)    CMP Latest Ref Rng & Units 01/25/2021 01/07/2021 08/16/2019  Glucose 70 - 99 mg/dL 198(H) 127(H) 198(H)  BUN 8 - 23 mg/dL 47(H) 41(H) 47(H)  Creatinine 0.61 - 1.24 mg/dL 3.11(HH) 3.21(HH) 3.23(H)  Sodium 135 - 145 mmol/L 136 137 140  Potassium 3.5 - 5.1 mmol/L 4.9 4.7 4.7  Chloride 98 - 111 mmol/L 101 102 104  CO2 22 - 32 mmol/L 27 26 20   Calcium 8.9 - 10.3 mg/dL 11.0(H) 9.5 8.8  Total Protein 6.5 - 8.1 g/dL 7.2 7.7 7.0  Total Bilirubin 0.3 - 1.2 mg/dL 0.5 0.5 0.3  Alkaline Phos 38 - 126 U/L 64 78 90  AST 15 - 41 U/L 47(H) 41 23  ALT 0 - 44 U/L 27 22 17     RADIOGRAPHIC STUDIES: I have personally reviewed the radiological images as listed and agreed with the findings in the report: involvement of the left abdomen, the kidney's bilaterally, and pulmonary nodule.   NM PET Image Initial (PI) Skull Base To Thigh  Result Date: 01/22/2021 CLINICAL DATA:  Initial treatment strategy for high-grade B-cell lymphoma. EXAM: NUCLEAR MEDICINE PET SKULL BASE TO THIGH TECHNIQUE: 9.8 mCi F-18 FDG was injected intravenously. Full-ring PET imaging was performed from the skull base to thigh after the radiotracer. CT data was obtained and used for attenuation correction and anatomic localization. Fasting blood glucose: 108 mg/dl COMPARISON:  CT abdomen 12/09/2020 FINDINGS: Mediastinal blood pool activity: SUV max 2.8 Liver activity: SUV max 3.5 NECK: Within the skin overlying the RIGHT occipital bone there is a subcutaneous ovoid lesion measuring 20 mm with intense metabolic activity (SUV max equal 35). Intense hypermetabolic activity is localizing to the posterior oropharynx midline with SUV max equal 35.1 (image 22) Incidental CT findings: none CHEST: Within the anterior mediastinum small paratracheal node on the LEFT at the level of the clavicle head measures 9 mm with SUV max equal 22.3 (image 57) In teh middle mediastinum just medial to the bronchus intermedius and adjacent to the esophagus is a  soft tissue  nodule measuring 19 mm on image 79 with SUV max equal 47.2. LEFT lower lobe nodule measuring 6 mm (image 88) with SUV max equal 6.1. Incidental CT findings: none ABDOMEN/PELVIS: Within the anterior abdominal wall LEFT of midline intensely hypermetabolic mass is increased in size compared to recent CT measuring 9.9 x 6.6 cm compared to 6.6 by 4.1 cm. Mass is intensely hypermetabolic SUV max equal 51. There is a new lesion along the lateral margin of the LEFT kidney measuring 2.1 cm (image 126) with SUV max equal 31.7. Second lesion extends from the upper pole of the LEFT kidney with SUV max equal 29 and measuring approximately 28 mm. Smaller lesions associated with the LEFT kidney on image 124 and similar lesion the posterior RIGHT kidney on the same image measures approximately 1 cm each. Lesion difficult define on the CT portion exam. Incidental CT findings: none SKELETON: Incidental CT findings: none IMPRESSION: 1. Unusual pattern of intensely hypermetabolic lymphoma lesions. Lesions have aggressive feature with rapid enlargement from CT 12/09/2020 and intense hypermetabolic activity. 2. Large hypermetabolic mass in the anterior abdominal wall LEFT of midline intense metabolic activity (SUV max equal 50) increased in size from 12/09/2020. 3. Subcutaneous nodule in the posterior RIGHT scalp as well as hypermetabolic focus in the posterior oropharynx. 4. Single hypermetabolic LEFT lobe pulmonary nodule. Hypermetabolic nodes within the anterior mediastinum and middle mediastinum. 5. New intensely hypermetabolic lesions along the cortex of the LEFT and RIGHT kidney. Larger lesions in the LEFT kidney with intense radiotracer activity. Electronically Signed   By: Suzy Bouchard M.D.   On: 01/22/2021 13:03   IR IMAGING GUIDED PORT INSERTION  Result Date: 01/28/2021 INDICATION: New diagnosis of lymphoma. In need of durable intravenous access for chemotherapy administration EXAM: IMPLANTED PORT A CATH  PLACEMENT WITH ULTRASOUND AND FLUOROSCOPIC GUIDANCE COMPARISON:  None. MEDICATIONS: None ANESTHESIA/SEDATION: Moderate (conscious) sedation was employed during this procedure. A total of Versed 2 mg and Fentanyl 100 mcg was administered intravenously. Moderate Sedation Time: 23 minutes. The patient's level of consciousness and vital signs were monitored continuously by radiology nursing throughout the procedure under my direct supervision. CONTRAST:  None FLUOROSCOPY TIME:  48 seconds (50 mGy) COMPLICATIONS: None immediate. PROCEDURE: The procedure, risks, benefits, and alternatives were explained to the patient. Questions regarding the procedure were encouraged and answered. The patient understands and consents to the procedure. The right neck and chest were prepped with chlorhexidine in a sterile fashion, and a sterile drape was applied covering the operative field. Maximum barrier sterile technique with sterile gowns and gloves were used for the procedure. A timeout was performed prior to the initiation of the procedure. Local anesthesia was provided with 1% lidocaine with epinephrine. After creating a small venotomy incision, a micropuncture kit was utilized to access the internal jugular vein. Real-time ultrasound guidance was utilized for vascular access including the acquisition of a permanent ultrasound image documenting patency of the accessed vessel. The microwire was utilized to measure appropriate catheter length. A subcutaneous port pocket was then created along the upper chest wall utilizing a combination of sharp and blunt dissection. The pocket was irrigated with sterile saline. A single lumen "Slim" sized power injectable port was chosen for placement. The 8 Fr catheter was tunneled from the port pocket site to the venotomy incision. The port was placed in the pocket. The external catheter was trimmed to appropriate length. At the venotomy, an 8 Fr peel-away sheath was placed over a guidewire under  fluoroscopic guidance. The catheter was then  placed through the sheath and the sheath was removed. Final catheter positioning was confirmed and documented with a fluoroscopic spot radiograph. The port was accessed with a Huber needle, aspirated and flushed with heparinized saline. The venotomy site was closed with an interrupted 4-0 Vicryl suture. The port pocket incision was closed with interrupted 2-0 Vicryl suture. Dermabond and Steri-strips were applied to both incisions. Dressings were applied. The patient tolerated the procedure well without immediate post procedural complication. FINDINGS: After catheter placement, the tip lies within the superior cavoatrial junction. The catheter aspirates and flushes normally and is ready for immediate use. IMPRESSION: Successful placement of a right internal jugular approach power injectable Port-A-Cath. The catheter is ready for immediate use. Electronically Signed   By: Sandi Mariscal M.D.   On: 01/28/2021 13:54    ASSESSMENT & PLAN Rodney Lee 80 y.o. male with medical history significant for advanced stage high grade B cell lymphoma who presents for a follow up visit.   Given Rodney Lee's complicated medical history with cardiac dysfunction, renal dysfunction, and advanced age I am concerned that he would not be able to tolerate anthracycline-based therapy such as R mini CHOP.  As such the best regimen to proceed with given his comorbidities would be R-GCVP which is not toxic to the heart or filter to the kidney and can be used in patients with advanced age.  At this time we recommend proceeding with R-GCVP chemotherapy given his advanced age, cardiac dysfunction, and poor renal function.  Previously discussed the risks and benefits of treatment including nausea, vomiting, diarrhea, neuropathy, and allergic reactions.  Patient has an IPI showing he has high risk with lower rates of success with these treatment regimens.  Patient voices understanding of the risks  and benefits of treatment was willing to proceed.  21 day cycle x 6 cycles. Rituximab 326m/m2 IV on Day 1, Cyclophosphamide 750 mg /m2 on Day 1, Vincristine 1.41mm2 (capped at 21m39mon Day 1, and Prednisone 73m221my 1-5 (protocol calls for prednisolone, but will adjust to lower dose prednisone). The Gemcitabine is to be administered on Day 1-8 and will be 750 mg/m2 IV over 30 minutes once per day on days 1 & 8 for Cycle 1, 875 mg/m2 IV over 30 minutes once per day on days 1 & 8 for Cycle 2, and 1000 mg/m2 IV over 30 minutes once per day on days 1 & 8 for Cycle 3 and onward.   # High Grade B Cell Lymphoma, Stage III/IV --PET CT scan findings consistent with at least Stage III disease, with organ involvement most likely a Stage IV --avoiding anthracycline therapy and alternative regimens given his poor cardiac and renal function -- tomorrow, 02/04/2021 is Cycle 1 Day 1 of R-GCVP --patient will RTC next week for Day 8 of treatment. Will assure weekly labs between now and next Cycle. Next clinic visit in 3 weeks time.   #Supportive Care -- chemotherapy education complete -- port placed -- zofran 8mg 35m PRN and compazine 10mg 61m6H for nausea -- allopurinol 300mg P41mily for TLS prophylaxis -- EMLA cream for port -- no pain medication required at this time.   No orders of the defined types were placed in this encounter.   All questions were answered. The patient knows to call the clinic with any problems, questions or concerns.  A total of more than 30 minutes were spent on this encounter with face-to-face time and non-face-to-face time, including preparing to see the patient, ordering tests  and/or medications, counseling the patient and coordination of care as outlined above.   Rodney Peoples, MD Department of Hematology/Oncology [place] at [facility] Phone: [phone] Pager: [phone] Email: [email or web address]  02/02/2021 9:23 AM

## 2021-02-03 ENCOUNTER — Ambulatory Visit: Payer: Medicare Other | Admitting: Hematology and Oncology

## 2021-02-03 ENCOUNTER — Encounter: Payer: Self-pay | Admitting: Hematology and Oncology

## 2021-02-03 ENCOUNTER — Inpatient Hospital Stay (HOSPITAL_BASED_OUTPATIENT_CLINIC_OR_DEPARTMENT_OTHER): Payer: Medicare Other | Admitting: Hematology and Oncology

## 2021-02-03 ENCOUNTER — Inpatient Hospital Stay: Payer: Medicare Other | Attending: Hematology and Oncology

## 2021-02-03 VITALS — BP 110/60 | HR 68 | Temp 97.9°F | Resp 17 | Ht 71.0 in | Wt 194.7 lb

## 2021-02-03 DIAGNOSIS — E1165 Type 2 diabetes mellitus with hyperglycemia: Secondary | ICD-10-CM | POA: Diagnosis not present

## 2021-02-03 DIAGNOSIS — C8510 Unspecified B-cell lymphoma, unspecified site: Secondary | ICD-10-CM | POA: Insufficient documentation

## 2021-02-03 DIAGNOSIS — Z5111 Encounter for antineoplastic chemotherapy: Secondary | ICD-10-CM | POA: Insufficient documentation

## 2021-02-03 DIAGNOSIS — Z5189 Encounter for other specified aftercare: Secondary | ICD-10-CM | POA: Insufficient documentation

## 2021-02-03 DIAGNOSIS — D6481 Anemia due to antineoplastic chemotherapy: Secondary | ICD-10-CM | POA: Diagnosis not present

## 2021-02-03 DIAGNOSIS — Z95828 Presence of other vascular implants and grafts: Secondary | ICD-10-CM

## 2021-02-03 LAB — CBC WITH DIFFERENTIAL (CANCER CENTER ONLY)
Abs Immature Granulocytes: 0.03 10*3/uL (ref 0.00–0.07)
Basophils Absolute: 0.1 10*3/uL (ref 0.0–0.1)
Basophils Relative: 2 %
Eosinophils Absolute: 0.1 10*3/uL (ref 0.0–0.5)
Eosinophils Relative: 1 %
HCT: 31.5 % — ABNORMAL LOW (ref 39.0–52.0)
Hemoglobin: 10.2 g/dL — ABNORMAL LOW (ref 13.0–17.0)
Immature Granulocytes: 1 %
Lymphocytes Relative: 20 %
Lymphs Abs: 1 10*3/uL (ref 0.7–4.0)
MCH: 34.5 pg — ABNORMAL HIGH (ref 26.0–34.0)
MCHC: 32.4 g/dL (ref 30.0–36.0)
MCV: 106.4 fL — ABNORMAL HIGH (ref 80.0–100.0)
Monocytes Absolute: 1 10*3/uL (ref 0.1–1.0)
Monocytes Relative: 20 %
Neutro Abs: 2.9 10*3/uL (ref 1.7–7.7)
Neutrophils Relative %: 56 %
Platelet Count: 84 10*3/uL — ABNORMAL LOW (ref 150–400)
RBC: 2.96 MIL/uL — ABNORMAL LOW (ref 4.22–5.81)
RDW: 14.7 % (ref 11.5–15.5)
WBC Count: 5.2 10*3/uL (ref 4.0–10.5)
nRBC: 0 % (ref 0.0–0.2)

## 2021-02-03 LAB — CMP (CANCER CENTER ONLY)
ALT: 22 U/L (ref 0–44)
AST: 50 U/L — ABNORMAL HIGH (ref 15–41)
Albumin: 3.2 g/dL — ABNORMAL LOW (ref 3.5–5.0)
Alkaline Phosphatase: 67 U/L (ref 38–126)
Anion gap: 11 (ref 5–15)
BUN: 42 mg/dL — ABNORMAL HIGH (ref 8–23)
CO2: 27 mmol/L (ref 22–32)
Calcium: 11.5 mg/dL — ABNORMAL HIGH (ref 8.9–10.3)
Chloride: 101 mmol/L (ref 98–111)
Creatinine: 2.76 mg/dL — ABNORMAL HIGH (ref 0.61–1.24)
GFR, Estimated: 22 mL/min — ABNORMAL LOW (ref >60)
Glucose, Bld: 146 mg/dL — ABNORMAL HIGH (ref 70–99)
Potassium: 4.8 mmol/L (ref 3.5–5.1)
Sodium: 139 mmol/L (ref 135–145)
Total Bilirubin: 0.5 mg/dL (ref 0.3–1.2)
Total Protein: 6.9 g/dL (ref 6.5–8.1)

## 2021-02-03 LAB — URIC ACID: Uric Acid, Serum: 3.4 mg/dL — ABNORMAL LOW (ref 3.7–8.6)

## 2021-02-03 LAB — LACTATE DEHYDROGENASE: LDH: 471 U/L — ABNORMAL HIGH (ref 98–192)

## 2021-02-03 MED ORDER — SODIUM CHLORIDE 0.9% FLUSH
10.0000 mL | Freq: Once | INTRAVENOUS | Status: AC
Start: 2021-02-03 — End: 2021-02-03
  Administered 2021-02-03: 10 mL
  Filled 2021-02-03: qty 10

## 2021-02-03 NOTE — Progress Notes (Signed)
Pharmacist Chemotherapy Monitoring - Initial Assessment    Anticipated start date: 02/04/21      Regimen:  . Are orders appropriate based on the patient's diagnosis, regimen, and cycle? Yes . Does the plan date match the patient's scheduled date? Yes . Is the sequencing of drugs appropriate? Yes . Are the premedications appropriate for the patient's regimen? Yes . Prior Authorization for treatment is: Approved o If applicable, is the correct biosimilar selected based on the patient's insurance? yes  Organ Function and Labs: Marland Kitchen Are dose adjustments needed based on the patient's renal function, hepatic function, or hematologic function? No . Are appropriate labs ordered prior to the start of patient's treatment? Yes . Other organ system assessment, if indicated: N/A . The following baseline labs, if indicated, have been ordered: rituximab: baseline Hepatitis B labs  Dose Assessment: . Are the drug doses appropriate? Yes . Are the following correct: o Drug concentrations Yes o IV fluid compatible with drug Yes o Administration routes Yes o Timing of therapy Yes . If applicable, does the patient have documented access for treatment and/or plans for port-a-cath placement? yes . If applicable, have lifetime cumulative doses been properly documented and assessed? not applicable Lifetime Dose Tracking  No doses have been documented on this patient for the following tracked chemicals: Doxorubicin, Epirubicin, Idarubicin, Daunorubicin, Mitoxantrone, Bleomycin, Oxaliplatin, Carboplatin, Liposomal Doxorubicin  o   Toxicity Monitoring/Prevention: . The patient has the following take home antiemetics prescribed: Ondansetron and Prochlorperazine . The patient has the following take home medications prescribed: N/A . Medication allergies and previous infusion related reactions, if applicable, have been reviewed and addressed. Yes . The patient's current medication list has been assessed for drug-drug  interactions with their chemotherapy regimen. no significant drug-drug interactions were identified on review.  Order Review: . Are the treatment plan orders signed? Yes . Is the patient scheduled to see a provider prior to their treatment? No  I verify that I have reviewed each item in the above checklist and answered each question accordingly.  Romualdo Bolk [place], [place], 02/03/2021  2:48 PM

## 2021-02-03 NOTE — Progress Notes (Signed)
Pt is approved for the $1000 [employer] grant.  

## 2021-02-04 ENCOUNTER — Inpatient Hospital Stay: Payer: Medicare Other

## 2021-02-04 ENCOUNTER — Other Ambulatory Visit: Payer: Self-pay

## 2021-02-04 VITALS — BP 123/59 | HR 59 | Temp 97.6°F | Resp 16

## 2021-02-04 DIAGNOSIS — C8510 Unspecified B-cell lymphoma, unspecified site: Secondary | ICD-10-CM

## 2021-02-04 DIAGNOSIS — Z5111 Encounter for antineoplastic chemotherapy: Secondary | ICD-10-CM | POA: Diagnosis not present

## 2021-02-04 DIAGNOSIS — Z5189 Encounter for other specified aftercare: Secondary | ICD-10-CM | POA: Diagnosis not present

## 2021-02-04 DIAGNOSIS — E1165 Type 2 diabetes mellitus with hyperglycemia: Secondary | ICD-10-CM | POA: Diagnosis not present

## 2021-02-04 DIAGNOSIS — D6481 Anemia due to antineoplastic chemotherapy: Secondary | ICD-10-CM | POA: Diagnosis not present

## 2021-02-04 MED ORDER — PALONOSETRON HCL INJECTION 0.25 MG/5ML
0.2500 mg | Freq: Once | INTRAVENOUS | Status: AC
  Administered 2021-02-04: 0.25 mg via INTRAVENOUS

## 2021-02-04 MED ORDER — ACETAMINOPHEN 325 MG PO TABS
650.0000 mg | ORAL_TABLET | Freq: Once | ORAL | Status: AC
  Administered 2021-02-04: 650 mg via ORAL

## 2021-02-04 MED ORDER — DIPHENHYDRAMINE HCL 25 MG PO CAPS
50.0000 mg | ORAL_CAPSULE | Freq: Once | ORAL | Status: AC
  Administered 2021-02-04: 50 mg via ORAL

## 2021-02-04 MED ORDER — SODIUM CHLORIDE 0.9 % IV SOLN
750.0000 mg/m2 | Freq: Once | INTRAVENOUS | Status: AC
  Administered 2021-02-04: 1558 mg via INTRAVENOUS
  Filled 2021-02-04: qty 40.98

## 2021-02-04 MED ORDER — DIPHENHYDRAMINE HCL 25 MG PO CAPS
ORAL_CAPSULE | ORAL | Status: AC
  Filled 2021-02-04: qty 2

## 2021-02-04 MED ORDER — HEPARIN SOD (PORK) LOCK FLUSH 100 UNIT/ML IV SOLN
500.0000 [IU] | Freq: Once | INTRAVENOUS | Status: AC | PRN
  Administered 2021-02-04: 500 [IU]
  Filled 2021-02-04: qty 5

## 2021-02-04 MED ORDER — PALONOSETRON HCL INJECTION 0.25 MG/5ML
INTRAVENOUS | Status: AC
  Filled 2021-02-04: qty 5

## 2021-02-04 MED ORDER — SODIUM CHLORIDE 0.9% FLUSH
10.0000 mL | INTRAVENOUS | Status: DC | PRN
  Administered 2021-02-04: 10 mL
  Filled 2021-02-04: qty 10

## 2021-02-04 MED ORDER — SODIUM CHLORIDE 0.9 % IV SOLN
375.0000 mg/m2 | Freq: Once | INTRAVENOUS | Status: AC
  Administered 2021-02-04: 800 mg via INTRAVENOUS
  Filled 2021-02-04: qty 50

## 2021-02-04 MED ORDER — VINCRISTINE SULFATE CHEMO INJECTION 1 MG/ML
2.0000 mg | Freq: Once | INTRAVENOUS | Status: AC
  Administered 2021-02-04: 2 mg via INTRAVENOUS
  Filled 2021-02-04: qty 2

## 2021-02-04 MED ORDER — ACETAMINOPHEN 325 MG PO TABS
ORAL_TABLET | ORAL | Status: AC
  Filled 2021-02-04: qty 2

## 2021-02-04 MED ORDER — SODIUM CHLORIDE 0.9 % IV SOLN
Freq: Once | INTRAVENOUS | Status: AC
Start: 2021-02-04 — End: 2021-02-04
  Filled 2021-02-04: qty 250

## 2021-02-04 MED ORDER — SODIUM CHLORIDE 0.9 % IV SOLN
10.0000 mg | Freq: Once | INTRAVENOUS | Status: AC
  Administered 2021-02-04: 10 mg via INTRAVENOUS
  Filled 2021-02-04: qty 10

## 2021-02-04 MED ORDER — SODIUM CHLORIDE 0.9 % IV SOLN
560.0000 mg/m2 | Freq: Once | INTRAVENOUS | Status: AC
  Administered 2021-02-04: 1180 mg via INTRAVENOUS
  Filled 2021-02-04: qty 59

## 2021-02-04 NOTE — Progress Notes (Signed)
Per Dr. Lorenso Courier, ok to treat with elevated creatinine and low platelets.

## 2021-02-04 NOTE — Progress Notes (Signed)
Discussed chemo regimen sequencing with Kennith Center, RPh. Advised to give in the following order: (1) vincristine (vesicant 1st); (2) gemcitabine; (3) cyclophosphamide; and (4) rituximab.

## 2021-02-04 NOTE — Patient Instructions (Signed)
[facility]    Discharge Instructions: Thank you for choosing [facility] to provide your oncology and hematology care.   If you have a lab appointment with the [facility], please go directly to the [facility] and check in at the registration area.   Wear comfortable clothing and clothing appropriate for easy access to any Portacath or PICC line.   We strive to give you quality time with your provider. You may need to reschedule your appointment if you arrive late (15 or more minutes).  Arriving late affects you and other patients whose appointments are after yours.  Also, if you miss three or more appointments without notifying the office, you may be dismissed from the clinic at the provider's discretion.      For prescription refill requests, have your pharmacy contact our office and allow 72 hours for refills to be completed.    Today you received the following chemotherapy and/or immunotherapy agents: rituximab, vincristine, gemcitabine, and cyclophosphamide.     To help prevent nausea and vomiting after your treatment, we encourage you to take your nausea medication as directed.  BELOW ARE SYMPTOMS THAT SHOULD BE REPORTED IMMEDIATELY: . *FEVER GREATER THAN 100.4 F (38 C) OR HIGHER . *CHILLS OR SWEATING . *NAUSEA AND VOMITING THAT IS NOT CONTROLLED WITH YOUR NAUSEA MEDICATION . *UNUSUAL SHORTNESS OF BREATH . *UNUSUAL BRUISING OR BLEEDING . *URINARY PROBLEMS (pain or burning when urinating, or frequent urination) . *BOWEL PROBLEMS (unusual diarrhea, constipation, pain near the anus) . TENDERNESS IN MOUTH AND THROAT WITH OR WITHOUT PRESENCE OF ULCERS (sore throat, sores in mouth, or a toothache) . UNUSUAL RASH, SWELLING OR PAIN  . UNUSUAL VAGINAL DISCHARGE OR ITCHING   Items with * indicate a potential emergency and should be followed up as soon as possible or go to the Emergency Department if any problems should occur.  Please show  the CHEMOTHERAPY ALERT CARD or IMMUNOTHERAPY ALERT CARD at check-in to the Emergency Department and triage nurse.  Should you have questions after your visit or need to cancel or reschedule your appointment, please contact [facility]  Dept: [phone]  and follow the prompts.  Office hours are 8:00 a.m. to 4:30 p.m. Monday - Friday. Please note that voicemails left after 4:00 p.m. may not be returned until the following business day.  We are closed weekends and major holidays. You have access to a nurse at all times for urgent questions. Please call the main number to the clinic Dept: [phone] and follow the prompts.   For any non-urgent questions, you may also contact your provider using MyChart. We now offer e-Visits for anyone 56 and older to request care online for non-urgent symptoms. For details visit [email or web address].   Also download the MyChart app! Go to the app store, search "MyChart", open the app, select [facility], and log in with your MyChart username and password.  Due to Covid, a mask is required upon entering the hospital/clinic. If you do not have a mask, one will be given to you upon arrival. For doctor visits, patients may have 1 support person aged 67 or older with them. For treatment visits, patients cannot have anyone with them due to current Covid guidelines and our immunocompromised population.   Rituximab Injection What is this medicine? RITUXIMAB (ri TUX i mab) is a monoclonal antibody. It is used to treat certain types of cancer like non-Hodgkin lymphoma and chronic lymphocytic leukemia. It is also  used to treat rheumatoid arthritis, granulomatosis with polyangiitis, microscopic polyangiitis, and pemphigus vulgaris. This medicine may be used for other purposes; ask your health care provider or pharmacist if you have questions. COMMON BRAND NAME(S): RIABNI, Rituxan, RUXIENCE What should I tell my health care provider before I  take this medicine? They need to know if you have any of these conditions:  chest pain  heart disease  infection especially a viral infection such as chickenpox, cold sores, hepatitis B, or herpes  immune system problems  irregular heartbeat or rhythm  kidney disease  low blood counts (white cells, platelets, or red cells)  lung disease  recent or upcoming vaccine  an unusual or allergic reaction to rituximab, other medicines, foods, dyes, or preservatives  pregnant or trying to get pregnant  breast-feeding How should I use this medicine? This medicine is injected into a vein. It is given by a health care provider in a hospital or clinic setting. A special MedGuide will be given to you before each treatment. Be sure to read this information carefully each time. Talk to your health care provider about the use of this medicine in children. While this drug may be prescribed for children as young as 2 years for selected conditions, precautions do apply. Overdosage: If you think you have taken too much of this medicine contact a poison control center or emergency room at once. NOTE: This medicine is only for you. Do not share this medicine with others. What if I miss a dose? Keep appointments for follow-up doses. It is important not to miss your dose. Call your health care provider if you are unable to keep an appointment. What may interact with this medicine? Do not take this medicine with any of the following medicines:  live vaccines This medicine may also interact with the following medicines:  cisplatin This list may not describe all possible interactions. Give your health care provider a list of all the medicines, herbs, non-prescription drugs, or dietary supplements you use. Also tell them if you smoke, drink alcohol, or use illegal drugs. Some items may interact with your medicine. What should I watch for while using this medicine? Your condition will be monitored  carefully while you are receiving this medicine. You may need blood work done while you are taking this medicine. This medicine can cause serious infusion reactions. To reduce the risk your health care provider may give you other medicines to take before receiving this one. Be sure to follow the directions from your health care provider. This medicine may increase your risk of getting an infection. Call your health care provider for advice if you get a fever, chills, sore throat, or other symptoms of a cold or flu. Do not treat yourself. Try to avoid being around people who are sick. Call your health care provider if you are around anyone with measles, chickenpox, or if you develop sores or blisters that do not heal properly. Avoid taking medicines that contain aspirin, acetaminophen, ibuprofen, naproxen, or ketoprofen unless instructed by your health care provider. These medicines may hide a fever. This medicine may cause serious skin reactions. They can happen weeks to months after starting the medicine. Contact your health care provider right away if you notice fevers or flu-like symptoms with a rash. The rash may be red or purple and then turn into blisters or peeling of the skin. Or, you might notice a red rash with swelling of the face, lips or lymph nodes in your neck or under  your arms. In some patients, this medicine may cause a serious brain infection that may cause death. If you have any problems seeing, thinking, speaking, walking, or standing, tell your healthcare professional right away. If you cannot reach your healthcare professional, urgently seek other source of medical care. Do not become pregnant while taking this medicine or for at least 12 months after stopping it. Women should inform their health care provider if they wish to become pregnant or think they might be pregnant. There is potential for serious harm to an unborn child. Talk to your health care provider for more information.  Women should use a reliable form of birth control while taking this medicine and for 12 months after stopping it. Do not breast-feed while taking this medicine or for at least 6 months after stopping it. What side effects may I notice from receiving this medicine? Side effects that you should report to your health care provider as soon as possible:  allergic reactions (skin rash, itching or hives; swelling of the face, lips, or tongue)  diarrhea  edema (sudden weight gain; swelling of the ankles, feet, hands or other unusual swelling; trouble breathing)  fast, irregular heartbeat  heart attack (trouble breathing; pain or tightness in the chest, neck, back or arms; unusually weak or tired)  infection (fever, chills, cough, sore throat, pain or trouble passing urine)  kidney injury (trouble passing urine or change in the amount of urine)  liver injury (dark yellow or brown urine; general ill feeling or flu-like symptoms; loss of appetite, right upper belly pain; unusually weak or tired, yellowing of the eyes or skin)  low blood pressure (dizziness; feeling faint or lightheaded, falls; unusually weak or tired)  low red blood cell counts (trouble breathing; feeling faint; lightheaded, falls; unusually weak or tired)  mouth sores  redness, blistering, peeling, or loosening of the skin, including inside the mouth  stomach pain  unusual bruising or bleeding  wheezing (trouble breathing with loud or whistling sounds)  vomiting Side effects that usually do not require medical attention (report to your health care provider if they continue or are bothersome):  headache  joint pain  muscle cramps, pain  nausea This list may not describe all possible side effects. Call your doctor for medical advice about side effects. You may report side effects to FDA at 1-800-FDA-1088. Where should I keep my medicine? This medicine is given in a hospital or clinic. It will not be stored at  home. NOTE: This sheet is a summary. It may not cover all possible information. If you have questions about this medicine, talk to your doctor, pharmacist, or health care provider.  2021 Elsevier/Gold Standard (2020-06-04 21:35:50)  Vincristine injection What is this medicine? VINCRISTINE (vin KRIS teen) is a chemotherapy drug. It slows the growth of cancer cells. This medicine is used to treat many types of cancer like Hodgkin's disease, leukemia, non-Hodgkin's lymphoma, neuroblastoma (brain cancer), rhabdomyosarcoma, and Wilms' tumor. This medicine may be used for other purposes; ask your health care provider or pharmacist if you have questions. COMMON BRAND NAME(S): Oncovin, Vincasar PFS What should I tell my health care provider before I take this medicine? They need to know if you have any of these conditions:  blood disorders  gout  infection (especially chickenpox, cold sores, or herpes)  kidney disease  liver disease  lung disease  nervous system disease like Charcot-Marie-Tooth (CMT)  recent or ongoing radiation therapy  an unusual or allergic reaction to vincristine, other chemotherapy agents,  other medicines, foods, dyes, or preservatives  pregnant or trying to get pregnant  breast-feeding How should I use this medicine? This drug is given as an infusion into a vein. It is administered in a hospital or clinic by a specially trained health care professional. If you have pain, swelling, burning, or any unusual feeling around the site of your injection, tell your health care professional right away. Talk to your pediatrician regarding the use of this medicine in children. While this drug may be prescribed for selected conditions, precautions do apply. Overdosage: If you think you have taken too much of this medicine contact a poison control center or emergency room at once. NOTE: This medicine is only for you. Do not share this medicine with others. What if I miss a  dose? It is important not to miss your dose. Call your doctor or health care professional if you are unable to keep an appointment. What may interact with this medicine?  certain medicines for fungal infections like itraconazole, ketoconazole, posaconazole, voriconazole  certain medicines for seizures like phenytoin This list may not describe all possible interactions. Give your health care provider a list of all the medicines, herbs, non-prescription drugs, or dietary supplements you use. Also tell them if you smoke, drink alcohol, or use illegal drugs. Some items may interact with your medicine. What should I watch for while using this medicine? This drug may make you feel generally unwell. This is not uncommon, as chemotherapy can affect healthy cells as well as cancer cells. Report any side effects. Continue your course of treatment even though you feel ill unless your doctor tells you to stop. You may need blood work done while you are taking this medicine. This medicine will cause constipation. Try to have a bowel movement at least every 2 to 3 days. If you do not have a bowel movement for 3 days, call your doctor or health care professional. In some cases, you may be given additional medicines to help with side effects. Follow all directions for their use. Do not become pregnant while taking this medicine. Women should inform their doctor if they wish to become pregnant or think they might be pregnant. There is a potential for serious side effects to an unborn child. Talk to your health care professional or pharmacist for more information. Do not breast-feed an infant while taking this medicine. This medicine may make it more difficult to get pregnant or to father a child. Talk to your healthcare professional if you are concerned about your fertility. What side effects may I notice from receiving this medicine? Side effects that you should report to your doctor or health care professional as  soon as possible:  allergic reactions like skin rash, itching or hives, swelling of the face, lips, or tongue  breathing problems  confusion or changes in emotions or moods  constipation  cough  mouth sores  muscle weakness  nausea and vomiting  pain, swelling, redness or irritation at the injection site  pain, tingling, numbness in the hands or feet  problems with balance, talking, walking  seizures  stomach pain  trouble passing urine or change in the amount of urine Side effects that usually do not require medical attention (report to your doctor or health care professional if they continue or are bothersome):  diarrhea  hair loss  jaw pain  loss of appetite This list may not describe all possible side effects. Call your doctor for medical advice about side effects. You may report  side effects to FDA at 1-800-FDA-1088. Where should I keep my medicine? This drug is given in a hospital or clinic and will not be stored at home. NOTE: This sheet is a summary. It may not cover all possible information. If you have questions about this medicine, talk to your doctor, pharmacist, or health care provider.  2021 Elsevier/Gold Standard (2019-07-23 17:05:13)  Gemcitabine injection What is this medicine? GEMCITABINE (jem SYE ta been) is a chemotherapy drug. This medicine is used to treat many types of cancer like breast cancer, lung cancer, pancreatic cancer, and ovarian cancer. This medicine may be used for other purposes; ask your health care provider or pharmacist if you have questions. COMMON BRAND NAME(S): Gemzar, Infugem What should I tell my health care provider before I take this medicine? They need to know if you have any of these conditions:  blood disorders  infection  kidney disease  liver disease  lung or breathing disease, like asthma  recent or ongoing radiation therapy  an unusual or allergic reaction to gemcitabine, other chemotherapy, other  medicines, foods, dyes, or preservatives  pregnant or trying to get pregnant  breast-feeding How should I use this medicine? This drug is given as an infusion into a vein. It is administered in a hospital or clinic by a specially trained health care professional. Talk to your pediatrician regarding the use of this medicine in children. Special care may be needed. Overdosage: If you think you have taken too much of this medicine contact a poison control center or emergency room at once. NOTE: This medicine is only for you. Do not share this medicine with others. What if I miss a dose? It is important not to miss your dose. Call your doctor or health care professional if you are unable to keep an appointment. What may interact with this medicine?  medicines to increase blood counts like filgrastim, pegfilgrastim, sargramostim  some other chemotherapy drugs like cisplatin  vaccines Talk to your doctor or health care professional before taking any of these medicines:  acetaminophen  aspirin  ibuprofen  ketoprofen  naproxen This list may not describe all possible interactions. Give your health care provider a list of all the medicines, herbs, non-prescription drugs, or dietary supplements you use. Also tell them if you smoke, drink alcohol, or use illegal drugs. Some items may interact with your medicine. What should I watch for while using this medicine? Visit your doctor for checks on your progress. This drug may make you feel generally unwell. This is not uncommon, as chemotherapy can affect healthy cells as well as cancer cells. Report any side effects. Continue your course of treatment even though you feel ill unless your doctor tells you to stop. In some cases, you may be given additional medicines to help with side effects. Follow all directions for their use. Call your doctor or health care professional for advice if you get a fever, chills or sore throat, or other symptoms of a  cold or flu. Do not treat yourself. This drug decreases your body's ability to fight infections. Try to avoid being around people who are sick. This medicine may increase your risk to bruise or bleed. Call your doctor or health care professional if you notice any unusual bleeding. Be careful brushing and flossing your teeth or using a toothpick because you may get an infection or bleed more easily. If you have any dental work done, tell your dentist you are receiving this medicine. Avoid taking products that contain aspirin,  acetaminophen, ibuprofen, naproxen, or ketoprofen unless instructed by your doctor. These medicines may hide a fever. Do not become pregnant while taking this medicine or for 6 months after stopping it. Women should inform their doctor if they wish to become pregnant or think they might be pregnant. Men should not father a child while taking this medicine and for 3 months after stopping it. There is a potential for serious side effects to an unborn child. Talk to your health care professional or pharmacist for more information. Do not breast-feed an infant while taking this medicine or for at least 1 week after stopping it. Men should inform their doctors if they wish to father a child. This medicine may lower sperm counts. Talk with your doctor or health care professional if you are concerned about your fertility. What side effects may I notice from receiving this medicine? Side effects that you should report to your doctor or health care professional as soon as possible:  allergic reactions like skin rash, itching or hives, swelling of the face, lips, or tongue  breathing problems  pain, redness, or irritation at site where injected  signs and symptoms of a dangerous change in heartbeat or heart rhythm like chest pain; dizziness; fast or irregular heartbeat; palpitations; feeling faint or lightheaded, falls; breathing problems  signs of decreased platelets or bleeding -  bruising, pinpoint red spots on the skin, black, tarry stools, blood in the urine  signs of decreased red blood cells - unusually weak or tired, feeling faint or lightheaded, falls  signs of infection - fever or chills, cough, sore throat, pain or difficulty passing urine  signs and symptoms of kidney injury like trouble passing urine or change in the amount of urine  signs and symptoms of liver injury like dark yellow or brown urine; general ill feeling or flu-like symptoms; light-colored stools; loss of appetite; nausea; right upper belly pain; unusually weak or tired; yellowing of the eyes or skin  swelling of ankles, feet, hands Side effects that usually do not require medical attention (report to your doctor or health care professional if they continue or are bothersome):  constipation  diarrhea  hair loss  loss of appetite  nausea  rash  vomiting This list may not describe all possible side effects. Call your doctor for medical advice about side effects. You may report side effects to FDA at 1-800-FDA-1088. Where should I keep my medicine? This drug is given in a hospital or clinic and will not be stored at home. NOTE: This sheet is a summary. It may not cover all possible information. If you have questions about this medicine, talk to your doctor, pharmacist, or health care provider.  2021 Elsevier/Gold Standard (2017-11-15 18:06:11)  Cyclophosphamide Injection What is this medicine? CYCLOPHOSPHAMIDE (sye kloe FOSS fa mide) is a chemotherapy drug. It slows the growth of cancer cells. This medicine is used to treat many types of cancer like lymphoma, myeloma, leukemia, breast cancer, and ovarian cancer, to name a few. This medicine may be used for other purposes; ask your health care provider or pharmacist if you have questions. COMMON BRAND NAME(S): Cytoxan, Neosar What should I tell my health care provider before I take this medicine? They need to know if you have any  of these conditions:  heart disease  history of irregular heartbeat  infection  kidney disease  liver disease  low blood counts, like white cells, platelets, or red blood cells  on hemodialysis  recent or ongoing radiation therapy  scarring or thickening of the lungs  trouble passing urine  an unusual or allergic reaction to cyclophosphamide, other medicines, foods, dyes, or preservatives  pregnant or trying to get pregnant  breast-feeding How should I use this medicine? This drug is usually given as an injection into a vein or muscle or by infusion into a vein. It is administered in a hospital or clinic by a specially trained health care professional. Talk to your pediatrician regarding the use of this medicine in children. Special care may be needed. Overdosage: If you think you have taken too much of this medicine contact a poison control center or emergency room at once. NOTE: This medicine is only for you. Do not share this medicine with others. What if I miss a dose? It is important not to miss your dose. Call your doctor or health care professional if you are unable to keep an appointment. What may interact with this medicine?  amphotericin B  azathioprine  certain antivirals for HIV or hepatitis  certain medicines for blood pressure, heart disease, irregular heart beat  certain medicines that treat or prevent blood clots like warfarin  certain other medicines for cancer  cyclosporine  etanercept  indomethacin  medicines that relax muscles for surgery  medicines to increase blood counts  metronidazole This list may not describe all possible interactions. Give your health care provider a list of all the medicines, herbs, non-prescription drugs, or dietary supplements you use. Also tell them if you smoke, drink alcohol, or use illegal drugs. Some items may interact with your medicine. What should I watch for while using this medicine? Your condition  will be monitored carefully while you are receiving this medicine. You may need blood work done while you are taking this medicine. Drink water or other fluids as directed. Urinate often, even at night. Some products may contain alcohol. Ask your health care professional if this medicine contains alcohol. Be sure to tell all health care professionals you are taking this medicine. Certain medicines, like metronidazole and disulfiram, can cause an unpleasant reaction when taken with alcohol. The reaction includes flushing, headache, nausea, vomiting, sweating, and increased thirst. The reaction can last from 30 minutes to several hours. Do not become pregnant while taking this medicine or for 1 year after stopping it. Women should inform their health care professional if they wish to become pregnant or think they might be pregnant. Men should not father a child while taking this medicine and for 4 months after stopping it. There is potential for serious side effects to an unborn child. Talk to your health care professional for more information. Do not breast-feed an infant while taking this medicine or for 1 week after stopping it. This medicine has caused ovarian failure in some women. This medicine may make it more difficult to get pregnant. Talk to your health care professional if you are concerned about your fertility. This medicine has caused decreased sperm counts in some men. This may make it more difficult to father a child. Talk to your health care professional if you are concerned about your fertility. Call your health care professional for advice if you get a fever, chills, or sore throat, or other symptoms of a cold or flu. Do not treat yourself. This medicine decreases your body's ability to fight infections. Try to avoid being around people who are sick. Avoid taking medicines that contain aspirin, acetaminophen, ibuprofen, naproxen, or ketoprofen unless instructed by your health care  professional. These medicines may hide a  fever. Talk to your health care professional about your risk of cancer. You may be more at risk for certain types of cancer if you take this medicine. If you are going to need surgery or other procedure, tell your health care professional that you are using this medicine. Be careful brushing or flossing your teeth or using a toothpick because you may get an infection or bleed more easily. If you have any dental work done, tell your dentist you are receiving this medicine. What side effects may I notice from receiving this medicine? Side effects that you should report to your doctor or health care professional as soon as possible:  allergic reactions like skin rash, itching or hives, swelling of the face, lips, or tongue  breathing problems  nausea, vomiting  signs and symptoms of bleeding such as bloody or black, tarry stools; red or dark brown urine; spitting up blood or brown material that looks like coffee grounds; red spots on the skin; unusual bruising or bleeding from the eyes, gums, or nose  signs and symptoms of heart failure like fast, irregular heartbeat, sudden weight gain; swelling of the ankles, feet, hands  signs and symptoms of infection like fever; chills; cough; sore throat; pain or trouble passing urine  signs and symptoms of kidney injury like trouble passing urine or change in the amount of urine  signs and symptoms of liver injury like dark yellow or brown urine; general ill feeling or flu-like symptoms; light-colored stools; loss of appetite; nausea; right upper belly pain; unusually weak or tired; yellowing of the eyes or skin Side effects that usually do not require medical attention (report to your doctor or health care professional if they continue or are bothersome):  confusion  decreased hearing  diarrhea  facial flushing  hair loss  headache  loss of appetite  missed menstrual periods  signs and symptoms of  low red blood cells or anemia such as unusually weak or tired; feeling faint or lightheaded; falls  skin discoloration This list may not describe all possible side effects. Call your doctor for medical advice about side effects. You may report side effects to FDA at 1-800-FDA-1088. Where should I keep my medicine? This drug is given in a hospital or clinic and will not be stored at home. NOTE: This sheet is a summary. It may not cover all possible information. If you have questions about this medicine, talk to your doctor, pharmacist, or health care provider.  2021 Elsevier/Gold Standard (2019-05-27 09:53:29)

## 2021-02-04 NOTE — Progress Notes (Signed)
Dr. Lorenso Courier aware of elevated creatinine. Advised to administer normal saline 500 mL bolus over 2 hours. Patient aware of plan of care and verbalizes understanding.

## 2021-02-05 ENCOUNTER — Emergency Department (HOSPITAL_COMMUNITY)
Admission: EM | Admit: 2021-02-05 | Discharge: 2021-02-05 | Disposition: A | Discharge status: Home / Self Care | Payer: Medicare Other | Attending: Emergency Medicine | Admitting: Emergency Medicine

## 2021-02-05 ENCOUNTER — Ambulatory Visit: Payer: Medicare Other | Admitting: Hematology and Oncology

## 2021-02-05 ENCOUNTER — Other Ambulatory Visit: Payer: Medicare Other

## 2021-02-05 ENCOUNTER — Other Ambulatory Visit: Payer: Self-pay

## 2021-02-05 DIAGNOSIS — R0902 Hypoxemia: Secondary | ICD-10-CM | POA: Diagnosis not present

## 2021-02-05 DIAGNOSIS — I130 Hypertensive heart and chronic kidney disease with heart failure and stage 1 through stage 4 chronic kidney disease, or unspecified chronic kidney disease: Secondary | ICD-10-CM | POA: Insufficient documentation

## 2021-02-05 DIAGNOSIS — Z743 Need for continuous supervision: Secondary | ICD-10-CM | POA: Diagnosis not present

## 2021-02-05 DIAGNOSIS — E039 Hypothyroidism, unspecified: Secondary | ICD-10-CM | POA: Insufficient documentation

## 2021-02-05 DIAGNOSIS — R001 Bradycardia, unspecified: Secondary | ICD-10-CM | POA: Insufficient documentation

## 2021-02-05 DIAGNOSIS — S40219A Abrasion of unspecified shoulder, initial encounter: Secondary | ICD-10-CM | POA: Diagnosis not present

## 2021-02-05 DIAGNOSIS — Z86018 Personal history of other benign neoplasm: Secondary | ICD-10-CM | POA: Insufficient documentation

## 2021-02-05 DIAGNOSIS — I5022 Chronic systolic (congestive) heart failure: Secondary | ICD-10-CM | POA: Insufficient documentation

## 2021-02-05 DIAGNOSIS — Z7984 Long term (current) use of oral hypoglycemic drugs: Secondary | ICD-10-CM | POA: Insufficient documentation

## 2021-02-05 DIAGNOSIS — I25111 Atherosclerotic heart disease of native coronary artery with angina pectoris with documented spasm: Secondary | ICD-10-CM | POA: Diagnosis not present

## 2021-02-05 DIAGNOSIS — Z79899 Other long term (current) drug therapy: Secondary | ICD-10-CM | POA: Diagnosis not present

## 2021-02-05 DIAGNOSIS — R5383 Other fatigue: Secondary | ICD-10-CM | POA: Insufficient documentation

## 2021-02-05 DIAGNOSIS — S8011XA Contusion of right lower leg, initial encounter: Secondary | ICD-10-CM | POA: Insufficient documentation

## 2021-02-05 DIAGNOSIS — N183 Chronic kidney disease, stage 3 unspecified: Secondary | ICD-10-CM | POA: Insufficient documentation

## 2021-02-05 DIAGNOSIS — S8012XA Contusion of left lower leg, initial encounter: Secondary | ICD-10-CM | POA: Insufficient documentation

## 2021-02-05 DIAGNOSIS — S8991XA Unspecified injury of right lower leg, initial encounter: Secondary | ICD-10-CM | POA: Diagnosis present

## 2021-02-05 DIAGNOSIS — R6889 Other general symptoms and signs: Secondary | ICD-10-CM | POA: Diagnosis not present

## 2021-02-05 DIAGNOSIS — X58XXXA Exposure to other specified factors, initial encounter: Secondary | ICD-10-CM | POA: Insufficient documentation

## 2021-02-05 DIAGNOSIS — E1122 Type 2 diabetes mellitus with diabetic chronic kidney disease: Secondary | ICD-10-CM | POA: Insufficient documentation

## 2021-02-05 DIAGNOSIS — R531 Weakness: Secondary | ICD-10-CM | POA: Insufficient documentation

## 2021-02-05 DIAGNOSIS — R29898 Other symptoms and signs involving the musculoskeletal system: Secondary | ICD-10-CM

## 2021-02-05 DIAGNOSIS — R9431 Abnormal electrocardiogram [ECG] [EKG]: Secondary | ICD-10-CM | POA: Diagnosis not present

## 2021-02-05 LAB — CBC WITH DIFFERENTIAL/PLATELET
Abs Immature Granulocytes: 0.21 10*3/uL — ABNORMAL HIGH (ref 0.00–0.07)
Basophils Absolute: 0 10*3/uL (ref 0.0–0.1)
Basophils Relative: 0 %
Eosinophils Absolute: 0 10*3/uL (ref 0.0–0.5)
Eosinophils Relative: 0 %
HCT: 28.1 % — ABNORMAL LOW (ref 39.0–52.0)
Hemoglobin: 9.3 g/dL — ABNORMAL LOW (ref 13.0–17.0)
Immature Granulocytes: 2 %
Lymphocytes Relative: 4 %
Lymphs Abs: 0.5 10*3/uL — ABNORMAL LOW (ref 0.7–4.0)
MCH: 34.7 pg — ABNORMAL HIGH (ref 26.0–34.0)
MCHC: 33.1 g/dL (ref 30.0–36.0)
MCV: 104.9 fL — ABNORMAL HIGH (ref 80.0–100.0)
Monocytes Absolute: 0.7 10*3/uL (ref 0.1–1.0)
Monocytes Relative: 5 %
Neutro Abs: 12.5 10*3/uL — ABNORMAL HIGH (ref 1.7–7.7)
Neutrophils Relative %: 89 %
Platelets: 91 10*3/uL — ABNORMAL LOW (ref 150–400)
RBC: 2.68 MIL/uL — ABNORMAL LOW (ref 4.22–5.81)
RDW: 14.8 % (ref 11.5–15.5)
WBC: 13.9 10*3/uL — ABNORMAL HIGH (ref 4.0–10.5)
nRBC: 0 % (ref 0.0–0.2)

## 2021-02-05 LAB — COMPREHENSIVE METABOLIC PANEL
ALT: 26 U/L (ref 0–44)
AST: 49 U/L — ABNORMAL HIGH (ref 15–41)
Albumin: 3 g/dL — ABNORMAL LOW (ref 3.5–5.0)
Alkaline Phosphatase: 63 U/L (ref 38–126)
Anion gap: 10 (ref 5–15)
BUN: 66 mg/dL — ABNORMAL HIGH (ref 8–23)
CO2: 22 mmol/L (ref 22–32)
Calcium: 10.9 mg/dL — ABNORMAL HIGH (ref 8.9–10.3)
Chloride: 103 mmol/L (ref 98–111)
Creatinine, Ser: 2.64 mg/dL — ABNORMAL HIGH (ref 0.61–1.24)
GFR, Estimated: 24 mL/min — ABNORMAL LOW (ref >60)
Glucose, Bld: 237 mg/dL — ABNORMAL HIGH (ref 70–99)
Potassium: 4.5 mmol/L (ref 3.5–5.1)
Sodium: 135 mmol/L (ref 135–145)
Total Bilirubin: 0.3 mg/dL (ref 0.3–1.2)
Total Protein: 6.3 g/dL — ABNORMAL LOW (ref 6.5–8.1)

## 2021-02-05 LAB — URINALYSIS, ROUTINE W REFLEX MICROSCOPIC
Bacteria, UA: NONE SEEN
Bilirubin Urine: NEGATIVE
Glucose, UA: >=500 mg/dL — AB
Ketones, ur: NEGATIVE mg/dL
Leukocytes,Ua: NEGATIVE
Nitrite: NEGATIVE
Protein, ur: 30 mg/dL — AB
Specific Gravity, Urine: 1.011 (ref 1.005–1.030)
pH: 5 (ref 5.0–8.0)

## 2021-02-05 LAB — CK: Total CK: 262 U/L (ref 49–397)

## 2021-02-05 MED ORDER — PROCHLORPERAZINE MALEATE 10 MG PO TABS
10.0000 mg | ORAL_TABLET | Freq: Once | ORAL | Status: AC
  Administered 2021-02-05: 10 mg via ORAL
  Filled 2021-02-05: qty 1

## 2021-02-05 MED ORDER — SODIUM CHLORIDE 0.9 % IV BOLUS
500.0000 mL | Freq: Once | INTRAVENOUS | Status: AC
  Administered 2021-02-05: 500 mL via INTRAVENOUS

## 2021-02-05 NOTE — ED Triage Notes (Signed)
Ems brings pt in from home for generalized weakness. 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 He now complains of feeling weak.

## 2021-02-05 NOTE — Discharge Instructions (Addendum)
Please follow closely with your oncologist regarding your visit today and your symptoms after chemotherapy.  Continue treating your symptoms as directed. Return for significantly worsening symptoms.

## 2021-02-05 NOTE — ED Provider Notes (Signed)
[facility] DEPT Provider Note   CSN: [id number] Arrival date & time: 02/05/21  1651     History Chief Complaint  Patient presents with  . Fatigue    Rodney Lee is a 80 y.o. male past medical history of diabetes, CHF, CAD, cardiac, left bundle branch block, ICD, CKD, and high-grade B-cell lymphoma.  He just began his very first chemotherapy infusion yesterday followed by Dr. Lorenso Courier.  Per review of medical record, he received infusion of vincristine, gemcitabine, cyclophosphamide, and rituximab-pvvr.  He states he has been feeling weak ever since his diagnosis though worsened today.  He woke up feeling better than he expected.  He has been taking the Compazine as directed every 6 hours.  His wife thinks he overdid it due to how well he was feeling and was in the crawl space under his house trying to change the air filter.  He states he was scooting around on his back and became fatigued.  He states bilateral legs "feel like jello."  The symptom is bilateral, and not favoring one side.  He does not feel weak in his arms.  Patient states he did not call his oncologist due to his symptoms, he came in for evaluation.  Denies chest pain, shortness of breath, no abdominal pain, nausea or vomiting, diarrhea.  Had some slow minimal bleeding from the cancer mass on his left abdominal wall though no melena or hematochezia, no hematuria.  Additional history provided by nursing, reports he was under his house in the heat for 2 hours waiting for his wife to arrive home. The history is provided by the patient and the spouse.       Past Medical History:  Diagnosis Date  . Atrial fibrillation ([place])    CHADS2Vasc is at least 5, on Xarelto  . CAD (coronary artery disease)   . cardiomyopathy    MDT ICD, Dr. Lovena Le 2009  . CHF (congestive heart failure) ([place])   . Diabetes mellitus   . Exogenous obesity   . Hyperlipidemia   . Hypertension   . Hypothyroidism   . MI, old  [place], PTCA LAD per pt  . Sinus bradycardia     Patient Active Problem List   Diagnosis Date Noted  . Port-A-Cath in place 02/03/2021  . High grade B-cell lymphoma ([place]) 01/26/2021  . VF (ventricular fibrillation) ([place]) 03/01/2017  . Trigger finger, left middle finger 02/27/2017  . Coronary artery disease involving native coronary artery of native heart with angina pectoris ([place]) 10/16/2016  . LBBB (left bundle branch block) 10/16/2016  . Stage 3 chronic kidney disease ([place]) 10/16/2016  . Ventricular tachycardia (paroxysmal) ([place]) 01/21/2015  . Diabetes mellitus due to underlying condition with stage 3 chronic kidney disease ([place]) 02/13/2014  . PAF (paroxysmal atrial fibrillation) ([place]) 10/08/2013  . Chronic ischemic heart disease, unspecified 06/20/2011  . Dyslipidemia 06/20/2011  . Ischemic cardiomyopathy 05/16/2009  . SINUS BRADYCARDIA 05/16/2009  . Chronic systolic CHF (congestive heart failure) ([place]) 05/16/2009  . Automatic implantable cardioverter-defibrillator in situ 05/16/2009    Past Surgical History:  Procedure Laterality Date  . ANGIOPLASTY    . CARDIAC CATHETERIZATION  03/13/1994   ACUTE MI WITH TOTAL OCCLUSION OF MID LAD. REPERFUSION AFTER CROSSING WITH A GUIDEWIRE. POOR RIGHT TO LEFT COLLATERAL FLOW. Pt states had PTCA  . CARDIAC DEFIBRILLATOR PLACEMENT    . CARDIOVASCULAR STRESS TEST  03/11/2008   EF 30%. NO REVERSIBLE ISCHEMIA  . EP IMPLANTABLE DEVICE N/A 09/28/2015  Procedure:  ICD Generator Changeout;  Surgeon: Evans Lance, MD;  Location: [facility];  Service: Cardiovascular;  Laterality: N/A;  . IR IMAGING GUIDED PORT INSERTION  01/28/2021  . US ECHOCARDIOGRAPHY  04/26/2010   EF 35-40%. MODERATE LVH WITH MODERATE GLOBAL LV SYSTOLIC DYSFUNCTION AND IMPAIRED RELAXATION. MILD AORTIC SCLEROSIS WITHOUT STENOSIS. NORMAL PULMONARY ARTERY PRESSURE.  Marland Kitchen US ECHOCARDIOGRAPHY  10/07/2004   EF 30-35%       Family History  Problem Relation Age of  Onset  . Heart disease Mother   . Heart attack Mother     Social History   Tobacco Use  . Smoking status: Never Smoker  . Smokeless tobacco: Never Used  Vaping Use  . Vaping Use: Never used  Substance Use Topics  . Alcohol use: No  . Drug use: No    Home Medications Prior to Admission medications   Medication Sig Start Date End Date Taking? Authorizing Provider  allopurinol (ZYLOPRIM) 300 MG tablet Take 1 tablet (300 mg total) by mouth daily. 01/26/21   Orson Slick, MD  amiodarone (PACERONE) 200 MG tablet TAKE 1 TABLET BY MOUTH  DAILY 01/18/21   Croitoru, Mihai, MD  carvedilol (COREG) 6.25 MG tablet TAKE 1 TABLET BY MOUTH  TWICE DAILY WITH A MEAL 02/26/20   Croitoru, Mihai, MD  citalopram (CELEXA) 20 MG tablet 1 tablet    [provider]  fexofenadine (ALLEGRA) 60 MG tablet 1 capsule 11/10/20   [provider]  glimepiride (AMARYL) 1 MG tablet Take 1 mg by mouth daily as needed. 05/26/17   [provider]  isosorbide mononitrate (IMDUR) 60 MG 24 hr tablet Take 60 mg in the morning and 30 mg (half a tablet) in the evening. 09/03/20   Croitoru, Mihai, MD  levothyroxine (SYNTHROID) 125 MCG tablet Take 125 mcg by mouth daily. 01/19/21   [provider]  lidocaine-prilocaine (EMLA) cream Apply 1 application topically as needed. 01/26/21   Orson Slick, MD  Multiple Vitamin (MULTIVITAMIN) tablet Take 1 tablet by mouth daily.    [provider]  nitroGLYCERIN (NITROSTAT) 0.4 MG SL tablet Place 1 tablet (0.4 mg total) under the tongue every 5 (five) minutes as needed for chest pain. 09/03/20   Croitoru, Mihai, MD  ondansetron (ZOFRAN) 8 MG tablet Take 1 tablet (8 mg total) by mouth every 8 (eight) hours as needed for nausea or vomiting. 01/26/21   Orson Slick, MD  predniSONE (DELTASONE) 20 MG tablet Take 3 tablets (60 mg total) by mouth as directed. Take 60 mg  ( 3 tablets) by mouth every morning on Days 1 to 5 of chemotherapy. 01/26/21    Orson Slick, MD  prochlorperazine (COMPAZINE) 10 MG tablet Take 1 tablet (10 mg total) by mouth every 6 (six) hours as needed for nausea or vomiting. 01/26/21   Orson Slick, MD  repaglinide (PRANDIN) 1 MG tablet  08/04/19   [provider]  rosuvastatin (CRESTOR) 40 MG tablet Take 40 mg by mouth daily.    Gaynelle Arabian, MD  vitamin C (ASCORBIC ACID) 500 MG tablet Take 500 mg by mouth daily.    [provider]    Allergies    Canagliflozin and Lipitor [atorvastatin calcium]  Review of Systems   Review of Systems  All other systems reviewed and are negative.   Physical Exam Updated Vital Signs BP 129/65   Pulse 60   Temp 98.1 F (36.7 C) (Oral)  Resp 14   SpO2 98%   Physical Exam Vitals and nursing note reviewed.  Constitutional:      Appearance: He is well-developed.  HENT:     Head: Normocephalic and atraumatic.  Eyes:     Conjunctiva/sclera: Conjunctivae normal.  Cardiovascular:     Rate and Rhythm: Regular rhythm. Bradycardia present.  Pulmonary:     Effort: Pulmonary effort is normal. No respiratory distress.     Breath sounds: Normal breath sounds.  Abdominal:     General: Bowel sounds are normal.     Palpations: Abdomen is soft.     Tenderness: There is no abdominal tenderness.     Comments: Neoplasm to left abdominal wall  Musculoskeletal:     Right lower leg: No edema.     Left lower leg: No edema.  Skin:    General: Skin is warm.     Comments: Bruises and scabbed superficial abrasions to upper and lower extremities  Neurological:     Mental Status: He is alert.  Psychiatric:        Behavior: Behavior normal.     ED Results / Procedures / Treatments   Labs (all labs ordered are listed, but only abnormal results are displayed) Labs Reviewed  COMPREHENSIVE METABOLIC PANEL - Abnormal; Notable for the following components:      Result Value   Glucose, Bld 237 (*)    BUN 66 (*)    Creatinine, Ser 2.64 (*)    Calcium  10.9 (*)    Total Protein 6.3 (*)    Albumin 3.0 (*)    AST 49 (*)    GFR, Estimated 24 (*)    All other components within normal limits  CBC WITH DIFFERENTIAL/PLATELET - Abnormal; Notable for the following components:   WBC 13.9 (*)    RBC 2.68 (*)    Hemoglobin 9.3 (*)    HCT 28.1 (*)    MCV 104.9 (*)    MCH 34.7 (*)    Platelets 91 (*)    Neutro Abs 12.5 (*)    Lymphs Abs 0.5 (*)    Abs Immature Granulocytes 0.21 (*)    All other components within normal limits  URINALYSIS, ROUTINE W REFLEX MICROSCOPIC - Abnormal; Notable for the following components:   Glucose, UA >=500 (*)    Hgb urine dipstick MODERATE (*)    Protein, ur 30 (*)    All other components within normal limits  CK    EKG None  Radiology No results found.  Procedures Procedures   Medications Ordered in ED Medications  sodium chloride 0.9 % bolus 500 mL (0 mLs Intravenous Stopped 02/05/21 2204)  prochlorperazine (COMPAZINE) tablet 10 mg (10 mg Oral Given 02/05/21 2204)    ED Course  I have reviewed the triage vital signs and the nursing notes.  Pertinent labs & imaging results that were available during my care of the patient were reviewed by me and considered in my medical decision making (see chart for details).    MDM Rules/Calculators/A&P                          Patient is an 80 year old male presenting for evaluation of bilateral leg weakness after beginning chemotherapy treatment yesterday for high-grade B-cell lymphoma.  Has been feeling weak generally with his diagnosis of cancer is feels more weak his legs today stating his legs "feel like Jell-O."  No other associated symptoms.  Suspect this is likely due to  side effect from chemotherapy treatment.  Screening labs are obtained, mild decrease in hemoglobin to 9.3 though patient has no symptoms of blood loss.  Metabolic panel without acute significant change from baseline, including baseline creatinine.  Urine showed moderate hemoglobin though  no red blood cells.  This was followed with CK which was in the normal limits.  He is given some IV fluids here and shows improvement.  He is ambulating with steady gait.  At this time do not believe patient needs any further work-up.  Recommend he continue monitoring his symptoms and follow closely with oncology clinic.  Return if symptoms worsen.  He verbalized understanding and is in agreement with his care plan.  Discussed with attending physician Dr. Ron Parker, who guided treatment and care plan.  Discussed results, findings, treatment and follow up. Patient advised of return precautions. Patient verbalized understanding and agreed with plan.  Final Clinical Impression(s) / ED Diagnoses Final diagnoses:  Bilateral leg weakness    Rx / DC Orders ED Discharge Orders    None       Takiyah Bohnsack, [place] N, PA-C 02/06/21 0106    Breck Coons, MD 02/11/21 0700

## 2021-02-06 ENCOUNTER — Inpatient Hospital Stay: Payer: Medicare Other

## 2021-02-11 ENCOUNTER — Other Ambulatory Visit: Payer: Self-pay | Admitting: Hematology and Oncology

## 2021-02-11 ENCOUNTER — Inpatient Hospital Stay: Payer: Medicare Other

## 2021-02-11 ENCOUNTER — Telehealth: Payer: Self-pay | Admitting: *Deleted

## 2021-02-11 ENCOUNTER — Ambulatory Visit: Payer: Medicare Other

## 2021-02-11 ENCOUNTER — Telehealth: Payer: Self-pay | Admitting: Hematology and Oncology

## 2021-02-11 ENCOUNTER — Other Ambulatory Visit: Payer: Self-pay

## 2021-02-11 VITALS — BP 98/53 | HR 60 | Temp 98.1°F | Resp 18

## 2021-02-11 DIAGNOSIS — C8510 Unspecified B-cell lymphoma, unspecified site: Secondary | ICD-10-CM | POA: Diagnosis not present

## 2021-02-11 DIAGNOSIS — D6481 Anemia due to antineoplastic chemotherapy: Secondary | ICD-10-CM | POA: Diagnosis not present

## 2021-02-11 DIAGNOSIS — E1165 Type 2 diabetes mellitus with hyperglycemia: Secondary | ICD-10-CM | POA: Diagnosis not present

## 2021-02-11 DIAGNOSIS — Z95828 Presence of other vascular implants and grafts: Secondary | ICD-10-CM

## 2021-02-11 DIAGNOSIS — Z5111 Encounter for antineoplastic chemotherapy: Secondary | ICD-10-CM | POA: Diagnosis not present

## 2021-02-11 DIAGNOSIS — Z5189 Encounter for other specified aftercare: Secondary | ICD-10-CM | POA: Diagnosis not present

## 2021-02-11 LAB — CBC WITH DIFFERENTIAL (CANCER CENTER ONLY)
Abs Immature Granulocytes: 0 10*3/uL (ref 0.00–0.07)
Basophils Absolute: 0 10*3/uL (ref 0.0–0.1)
Basophils Relative: 0 %
Eosinophils Absolute: 0 10*3/uL (ref 0.0–0.5)
Eosinophils Relative: 3 %
HCT: 28.8 % — ABNORMAL LOW (ref 39.0–52.0)
Hemoglobin: 9.6 g/dL — ABNORMAL LOW (ref 13.0–17.0)
Lymphocytes Relative: 31 %
Lymphs Abs: 0.4 10*3/uL — ABNORMAL LOW (ref 0.7–4.0)
MCH: 34.2 pg — ABNORMAL HIGH (ref 26.0–34.0)
MCHC: 33.3 g/dL (ref 30.0–36.0)
MCV: 102.5 fL — ABNORMAL HIGH (ref 80.0–100.0)
Monocytes Absolute: 0 10*3/uL — ABNORMAL LOW (ref 0.1–1.0)
Monocytes Relative: 3 %
Neutro Abs: 0.8 10*3/uL — ABNORMAL LOW (ref 1.7–7.7)
Neutrophils Relative %: 63 %
Platelet Count: 37 10*3/uL — ABNORMAL LOW (ref 150–400)
RBC: 2.81 MIL/uL — ABNORMAL LOW (ref 4.22–5.81)
RDW: 14.3 % (ref 11.5–15.5)
WBC Count: 1.2 10*3/uL — ABNORMAL LOW (ref 4.0–10.5)
nRBC: 0 % (ref 0.0–0.2)

## 2021-02-11 LAB — CMP (CANCER CENTER ONLY)
ALT: 36 U/L (ref 0–44)
AST: 25 U/L (ref 15–41)
Albumin: 2.9 g/dL — ABNORMAL LOW (ref 3.5–5.0)
Alkaline Phosphatase: 57 U/L (ref 38–126)
Anion gap: 10 (ref 5–15)
BUN: 46 mg/dL — ABNORMAL HIGH (ref 8–23)
CO2: 28 mmol/L (ref 22–32)
Calcium: 9 mg/dL (ref 8.9–10.3)
Chloride: 101 mmol/L (ref 98–111)
Creatinine: 2.13 mg/dL — ABNORMAL HIGH (ref 0.61–1.24)
GFR, Estimated: 31 mL/min — ABNORMAL LOW (ref >60)
Glucose, Bld: 165 mg/dL — ABNORMAL HIGH (ref 70–99)
Potassium: 4.2 mmol/L (ref 3.5–5.1)
Sodium: 139 mmol/L (ref 135–145)
Total Bilirubin: 1.4 mg/dL — ABNORMAL HIGH (ref 0.3–1.2)
Total Protein: 6.3 g/dL — ABNORMAL LOW (ref 6.5–8.1)

## 2021-02-11 LAB — URIC ACID: Uric Acid, Serum: 2 mg/dL — ABNORMAL LOW (ref 3.7–8.6)

## 2021-02-11 LAB — LACTATE DEHYDROGENASE: LDH: 250 U/L — ABNORMAL HIGH (ref 98–192)

## 2021-02-11 MED ORDER — SODIUM CHLORIDE 0.9 % IV SOLN
Freq: Once | INTRAVENOUS | Status: AC
  Filled 2021-02-11: qty 250

## 2021-02-11 MED ORDER — HEPARIN SOD (PORK) LOCK FLUSH 100 UNIT/ML IV SOLN
500.0000 [IU] | Freq: Once | INTRAVENOUS | Status: AC | PRN
  Administered 2021-02-11: 500 [IU]
  Filled 2021-02-11: qty 5

## 2021-02-11 MED ORDER — SODIUM CHLORIDE 0.9% FLUSH
10.0000 mL | Freq: Once | INTRAVENOUS | Status: AC
  Administered 2021-02-11: 10 mL
  Filled 2021-02-11: qty 10

## 2021-02-11 MED ORDER — SODIUM CHLORIDE 0.9 % IV SOLN
750.0000 mg/m2 | Freq: Once | INTRAVENOUS | Status: AC
  Administered 2021-02-11: 1558 mg via INTRAVENOUS
  Filled 2021-02-11: qty 40.98

## 2021-02-11 MED ORDER — PROCHLORPERAZINE MALEATE 10 MG PO TABS
ORAL_TABLET | ORAL | Status: AC
  Filled 2021-02-11: qty 1

## 2021-02-11 MED ORDER — SODIUM CHLORIDE 0.9% FLUSH
10.0000 mL | INTRAVENOUS | Status: DC | PRN
  Administered 2021-02-11: 10 mL
  Filled 2021-02-11: qty 10

## 2021-02-11 MED ORDER — PROCHLORPERAZINE MALEATE 10 MG PO TABS
10.0000 mg | ORAL_TABLET | Freq: Once | ORAL | Status: AC
  Administered 2021-02-11: 10 mg via ORAL

## 2021-02-11 NOTE — Progress Notes (Signed)
Per Dr. Lorenso Courier, ok to treat with current labs.

## 2021-02-11 NOTE — Telephone Encounter (Signed)
Scheduled appointment per 06/09 sch msg. Patient will receive updated calender. 

## 2021-02-11 NOTE — Telephone Encounter (Signed)
TCT patient regarding her husband's labs today prior to his treatment. Spoke with her and advised that his platelet count is low and so is his WBCs. Provided instruction on neutropenia precautions and bleeding precautions..  She voiced understanding. Also reminded her when to call the call cancer center for advice on any new symptoms and when to call EMS. She voiced understanding.

## 2021-02-11 NOTE — Patient Instructions (Signed)
[facility]  Discharge Instructions: Thank you for choosing [facility] to provide your oncology and hematology care.   If you have a lab appointment with the Cancer Center, please go directly to the Cancer Center and check in at the registration area.   Wear comfortable clothing and clothing appropriate for easy access to any Portacath or PICC line.   We strive to give you quality time with your provider. You may need to reschedule your appointment if you arrive late (15 or more minutes).  Arriving late affects you and other patients whose appointments are after yours.  Also, if you miss three or more appointments without notifying the office, you may be dismissed from the clinic at the provider's discretion.      For prescription refill requests, have your pharmacy contact our office and allow 72 hours for refills to be completed.    Today you received the following chemotherapy and/or immunotherapy agents Gemzar      To help prevent nausea and vomiting after your treatment, we encourage you to take your nausea medication as directed.  BELOW ARE SYMPTOMS THAT SHOULD BE REPORTED IMMEDIATELY: *FEVER GREATER THAN 100.4 F (38 C) OR HIGHER *CHILLS OR SWEATING *NAUSEA AND VOMITING THAT IS NOT CONTROLLED WITH YOUR NAUSEA MEDICATION *UNUSUAL SHORTNESS OF BREATH *UNUSUAL BRUISING OR BLEEDING *URINARY PROBLEMS (pain or burning when urinating, or frequent urination) *BOWEL PROBLEMS (unusual diarrhea, constipation, pain near the anus) TENDERNESS IN MOUTH AND THROAT WITH OR WITHOUT PRESENCE OF ULCERS (sore throat, sores in mouth, or a toothache) UNUSUAL RASH, SWELLING OR PAIN  UNUSUAL VAGINAL DISCHARGE OR ITCHING   Items with * indicate a potential emergency and should be followed up as soon as possible or go to the Emergency Department if any problems should occur.  Please show the CHEMOTHERAPY ALERT CARD or IMMUNOTHERAPY ALERT CARD at check-in to the  Emergency Department and triage nurse.  Should you have questions after your visit or need to cancel or reschedule your appointment, please contact  [facility]  Dept: [phone]  and follow the prompts.  Office hours are 8:00 a.m. to 4:30 p.m. Monday - Friday. Please note that voicemails left after 4:00 p.m. may not be returned until the following business day.  We are closed weekends and major holidays. You have access to a nurse at all times for urgent questions. Please call the main number to the clinic Dept: [phone] and follow the prompts.   For any non-urgent questions, you may also contact your provider using MyChart. We now offer e-Visits for anyone 18 and older to request care online for non-urgent symptoms. For details visit [email or web address].   Also download the MyChart app! Go to the app store, search "MyChart", open the app, select , and log in with your MyChart username and password.  Due to Covid, a mask is required upon entering the hospital/clinic. If you do not have a mask, one will be given to you upon arrival. For doctor visits, patients may have 1 support person aged 18 or older with them. For treatment visits, patients cannot have anyone with them due to current Covid guidelines and our immunocompromised population.   

## 2021-02-12 ENCOUNTER — Inpatient Hospital Stay: Payer: Medicare Other

## 2021-02-12 VITALS — BP 98/47 | HR 63 | Temp 98.5°F | Resp 18

## 2021-02-12 DIAGNOSIS — Z5189 Encounter for other specified aftercare: Secondary | ICD-10-CM | POA: Diagnosis not present

## 2021-02-12 DIAGNOSIS — Z5111 Encounter for antineoplastic chemotherapy: Secondary | ICD-10-CM | POA: Diagnosis not present

## 2021-02-12 DIAGNOSIS — C8510 Unspecified B-cell lymphoma, unspecified site: Secondary | ICD-10-CM | POA: Diagnosis not present

## 2021-02-12 DIAGNOSIS — D6481 Anemia due to antineoplastic chemotherapy: Secondary | ICD-10-CM | POA: Diagnosis not present

## 2021-02-12 DIAGNOSIS — E1165 Type 2 diabetes mellitus with hyperglycemia: Secondary | ICD-10-CM | POA: Diagnosis not present

## 2021-02-12 MED ORDER — PEGFILGRASTIM-BMEZ 6 MG/0.6ML ~~LOC~~ SOSY
6.0000 mg | PREFILLED_SYRINGE | Freq: Once | SUBCUTANEOUS | Status: AC
  Administered 2021-02-12: 6 mg via SUBCUTANEOUS

## 2021-02-12 MED ORDER — PEGFILGRASTIM-BMEZ 6 MG/0.6ML ~~LOC~~ SOSY
PREFILLED_SYRINGE | SUBCUTANEOUS | Status: AC
  Filled 2021-02-12: qty 0.6

## 2021-02-12 NOTE — Patient Instructions (Signed)

## 2021-02-18 ENCOUNTER — Other Ambulatory Visit: Payer: Self-pay | Admitting: *Deleted

## 2021-02-18 ENCOUNTER — Other Ambulatory Visit: Payer: Self-pay

## 2021-02-18 ENCOUNTER — Encounter: Payer: Self-pay | Admitting: *Deleted

## 2021-02-18 ENCOUNTER — Inpatient Hospital Stay: Payer: Medicare Other

## 2021-02-18 DIAGNOSIS — Z5111 Encounter for antineoplastic chemotherapy: Secondary | ICD-10-CM | POA: Diagnosis not present

## 2021-02-18 DIAGNOSIS — C9000 Multiple myeloma not having achieved remission: Secondary | ICD-10-CM

## 2021-02-18 DIAGNOSIS — Z5189 Encounter for other specified aftercare: Secondary | ICD-10-CM | POA: Diagnosis not present

## 2021-02-18 DIAGNOSIS — C8510 Unspecified B-cell lymphoma, unspecified site: Secondary | ICD-10-CM | POA: Diagnosis not present

## 2021-02-18 DIAGNOSIS — E1165 Type 2 diabetes mellitus with hyperglycemia: Secondary | ICD-10-CM | POA: Diagnosis not present

## 2021-02-18 DIAGNOSIS — D6481 Anemia due to antineoplastic chemotherapy: Secondary | ICD-10-CM | POA: Diagnosis not present

## 2021-02-18 LAB — CBC WITH DIFFERENTIAL (CANCER CENTER ONLY)
Abs Immature Granulocytes: 0.03 10*3/uL (ref 0.00–0.07)
Basophils Absolute: 0 10*3/uL (ref 0.0–0.1)
Basophils Relative: 0 %
Eosinophils Absolute: 0 10*3/uL (ref 0.0–0.5)
Eosinophils Relative: 0 %
HCT: 19.7 % — ABNORMAL LOW (ref 39.0–52.0)
Hemoglobin: 6.5 g/dL — CL (ref 13.0–17.0)
Immature Granulocytes: 1 %
Lymphocytes Relative: 17 %
Lymphs Abs: 0.5 10*3/uL — ABNORMAL LOW (ref 0.7–4.0)
MCH: 34 pg (ref 26.0–34.0)
MCHC: 33 g/dL (ref 30.0–36.0)
MCV: 103.1 fL — ABNORMAL HIGH (ref 80.0–100.0)
Monocytes Absolute: 0.6 10*3/uL (ref 0.1–1.0)
Monocytes Relative: 22 %
Neutro Abs: 1.7 10*3/uL (ref 1.7–7.7)
Neutrophils Relative %: 60 %
Platelet Count: 8 10*3/uL — CL (ref 150–400)
RBC: 1.91 MIL/uL — ABNORMAL LOW (ref 4.22–5.81)
RDW: 14.4 % (ref 11.5–15.5)
WBC Count: 2.9 10*3/uL — ABNORMAL LOW (ref 4.0–10.5)
WBC Morphology: INCREASED
nRBC: 0 % (ref 0.0–0.2)

## 2021-02-18 LAB — LACTATE DEHYDROGENASE: LDH: 164 U/L (ref 98–192)

## 2021-02-18 LAB — PREPARE RBC (CROSSMATCH)

## 2021-02-18 LAB — ABO/RH: ABO/RH(D): O POS

## 2021-02-18 NOTE — Progress Notes (Signed)
Type and screen drawn and sent to lab

## 2021-02-18 NOTE — Patient Instructions (Signed)
Implanted Port Home Guide An implanted port is a device that is placed under the skin. It is usually placed in the chest. The device can be used to give IV medicine, to take blood, or for dialysis. You may have an implanted port if: You need IV medicine that would be irritating to the small veins in your hands or arms. You need IV medicines, such as antibiotics, for a long period of time. You need IV nutrition for a long period of time. You need dialysis. When you have a port, your health care provider can choose to use the port instead of veins in your arms for these procedures. You may have fewer limitations when using a port than you would if you used other types of long-term IVs, and you will likely be able to return to normal activities afteryour incision heals. An implanted port has two main parts: Reservoir. The reservoir is the part where a needle is inserted to give medicines or draw blood. The reservoir is round. After it is placed, it appears as a small, raised area under your skin. Catheter. The catheter is a thin, flexible tube that connects the reservoir to a vein. Medicine that is inserted into the reservoir goes into the catheter and then into the vein. How is my port accessed? To access your port: A numbing cream may be placed on the skin over the port site. Your health care provider will put on a mask and sterile gloves. The skin over your port will be cleaned carefully with a germ-killing soap and allowed to dry. Your health care provider will gently pinch the port and insert a needle into it. Your health care provider will check for a blood return to make sure the port is in the vein and is not clogged. If your port needs to remain accessed to get medicine continuously (constant infusion), your health care provider will place a clear bandage (dressing) over the needle site. The dressing and needle will need to be changed every week, or as told by your health care provider. What  is flushing? Flushing helps keep the port from getting clogged. Follow instructions from your health care provider about how and when to flush the port. Ports are usually flushed with saline solution or a medicine called heparin. The need for flushing will depend on how the port is used: If the port is only used from time to time to give medicines or draw blood, the port may need to be flushed: Before and after medicines have been given. Before and after blood has been drawn. As part of routine maintenance. Flushing may be recommended every 4-6 weeks. If a constant infusion is running, the port may not need to be flushed. Throw away any syringes in a disposal container that is meant for sharp items (sharps container). You can buy a sharps container from a pharmacy, or you can make one by using an empty hard plastic bottle with a cover. How long will my port stay implanted? The port can stay in for as long as your health care provider thinks it is needed. When it is time for the port to come out, a surgery will be done to remove it. The surgery will be similar to the procedure that was done to putthe port in. Follow these instructions at home:  Flush your port as told by your health care provider. If you need an infusion over several days, follow instructions from your health care provider about how to take   care of your port site. Make sure you: Wash your hands with soap and water before you change your dressing. If soap and water are not available, use alcohol-based hand sanitizer. Change your dressing as told by your health care provider. Place any used dressings or infusion bags into a plastic bag. Throw that bag in the trash. Keep the dressing that covers the needle clean and dry. Do not get it wet. Do not use scissors or sharp objects near the tube. Keep the tube clamped, unless it is being used. Check your port site every day for signs of infection. Check for: Redness, swelling, or  pain. Fluid or blood. Pus or a bad smell. Protect the skin around the port site. Avoid wearing bra straps that rub or irritate the site. Protect the skin around your port from seat belts. Place a soft pad over your chest if needed. Bathe or shower as told by your health care provider. The site may get wet as long as you are not actively receiving an infusion. Return to your normal activities as told by your health care provider. Ask your health care provider what activities are safe for you. Carry a medical alert card or wear a medical alert bracelet at all times. This will let health care providers know that you have an implanted port in case of an emergency. Get help right away if: You have redness, swelling, or pain at the port site. You have fluid or blood coming from your port site. You have pus or a bad smell coming from the port site. You have a fever. Summary Implanted ports are usually placed in the chest for long-term IV access. Follow instructions from your health care provider about flushing the port and changing bandages (dressings). Take care of the area around your port by avoiding clothing that puts pressure on the area, and by watching for signs of infection. Protect the skin around your port from seat belts. Place a soft pad over your chest if needed. Get help right away if you have a fever or you have redness, swelling, pain, drainage, or a bad smell at the port site. This information is not intended to replace advice given to you by your health care provider. Make sure you discuss any questions you have with your healthcare provider. Document Revised: 01/06/2020 Document Reviewed: 01/06/2020 Elsevier Patient Education  2022 [employer]  

## 2021-02-18 NOTE — Progress Notes (Signed)
Critical value received from lab, hgb 6.5 plts 8.  Message given to Inocencio Homes, Therapist, sports.

## 2021-02-19 ENCOUNTER — Encounter: Payer: Self-pay | Admitting: Hematology and Oncology

## 2021-02-19 ENCOUNTER — Other Ambulatory Visit: Payer: Self-pay

## 2021-02-19 DIAGNOSIS — C9000 Multiple myeloma not having achieved remission: Secondary | ICD-10-CM

## 2021-02-19 LAB — CMP (CANCER CENTER ONLY)
ALT: 27 U/L (ref 0–44)
AST: 24 U/L (ref 15–41)
Albumin: 2.7 g/dL — ABNORMAL LOW (ref 3.5–5.0)
Alkaline Phosphatase: 57 U/L (ref 38–126)
Anion gap: 9 (ref 5–15)
BUN: 47 mg/dL — ABNORMAL HIGH (ref 8–23)
CO2: 24 mmol/L (ref 22–32)
Calcium: 8.2 mg/dL — ABNORMAL LOW (ref 8.9–10.3)
Chloride: 101 mmol/L (ref 98–111)
Creatinine: 2.65 mg/dL — ABNORMAL HIGH (ref 0.61–1.24)
GFR, Estimated: 23 mL/min — ABNORMAL LOW (ref >60)
Glucose, Bld: 193 mg/dL — ABNORMAL HIGH (ref 70–99)
Potassium: 4.5 mmol/L (ref 3.5–5.1)
Sodium: 134 mmol/L — ABNORMAL LOW (ref 135–145)
Total Bilirubin: 0.5 mg/dL (ref 0.3–1.2)
Total Protein: 5.9 g/dL — ABNORMAL LOW (ref 6.5–8.1)

## 2021-02-19 LAB — URIC ACID: Uric Acid, Serum: 1.7 mg/dL — ABNORMAL LOW (ref 3.7–8.6)

## 2021-02-19 NOTE — Progress Notes (Signed)
Pt was seen in infusion on 02/18/21 for a port flush and lab draw. Pt was assessed for small open area at the top of buttocks right inside fold. Open area about the size of a nickel and appears to be a stage 2 pressure sore. Tender to touch and pt states he stays on his back a lot in the recliner during the day. Encouraged pt to stay off area. Referral to wound care made.

## 2021-02-20 ENCOUNTER — Other Ambulatory Visit: Payer: Self-pay

## 2021-02-20 ENCOUNTER — Inpatient Hospital Stay: Payer: Medicare Other

## 2021-02-20 DIAGNOSIS — Z5111 Encounter for antineoplastic chemotherapy: Secondary | ICD-10-CM | POA: Diagnosis not present

## 2021-02-20 DIAGNOSIS — Z5189 Encounter for other specified aftercare: Secondary | ICD-10-CM | POA: Diagnosis not present

## 2021-02-20 DIAGNOSIS — D6481 Anemia due to antineoplastic chemotherapy: Secondary | ICD-10-CM | POA: Diagnosis not present

## 2021-02-20 DIAGNOSIS — C9000 Multiple myeloma not having achieved remission: Secondary | ICD-10-CM

## 2021-02-20 DIAGNOSIS — C8510 Unspecified B-cell lymphoma, unspecified site: Secondary | ICD-10-CM | POA: Diagnosis not present

## 2021-02-20 DIAGNOSIS — E1165 Type 2 diabetes mellitus with hyperglycemia: Secondary | ICD-10-CM | POA: Diagnosis not present

## 2021-02-20 MED ORDER — HEPARIN SOD (PORK) LOCK FLUSH 100 UNIT/ML IV SOLN
500.0000 [IU] | Freq: Every day | INTRAVENOUS | Status: AC | PRN
  Administered 2021-02-20: 500 [IU]
  Filled 2021-02-20: qty 5

## 2021-02-20 MED ORDER — ACETAMINOPHEN 325 MG PO TABS
650.0000 mg | ORAL_TABLET | Freq: Once | ORAL | Status: AC
  Administered 2021-02-20: 650 mg via ORAL

## 2021-02-20 MED ORDER — SODIUM CHLORIDE 0.9% FLUSH
10.0000 mL | INTRAVENOUS | Status: AC | PRN
  Administered 2021-02-20: 10 mL
  Filled 2021-02-20: qty 10

## 2021-02-20 MED ORDER — SODIUM CHLORIDE 0.9% IV SOLUTION
250.0000 mL | Freq: Once | INTRAVENOUS | Status: AC
  Administered 2021-02-20: 250 mL via INTRAVENOUS
  Filled 2021-02-20: qty 250

## 2021-02-20 NOTE — Patient Instructions (Signed)
https://www.[email or web address]/donate-blood/blood-donation-process/what-happens-to-donated-blood/blood-transfusions/types-of-blood-transfusions.html"> https://www.[facility].org/donate-blood/blood-donation-process/what-happens-to-donated-blood/blood-transfusions/risks-complications.html">  Blood Transfusion, Adult, Care After This sheet gives you information about how to care for yourself after your procedure. Your health care provider may also give you more specific instructions. If you have problems or questions, contact your health careprovider. What can I expect after the procedure? After the procedure, it is common to have: Bruising and soreness where the IV was inserted. A fever or chills on the day of the procedure. This may be your body's response to the new blood cells received. A headache. Follow these instructions at home: IV insertion site care     Follow instructions from your health care provider about how to take care of your IV insertion site. Make sure you: Wash your hands with soap and water before and after you change your bandage (dressing). If soap and water are not available, use hand sanitizer. Change your dressing as told by your health care provider. Check your IV insertion site every day for signs of infection. Check for: Redness, swelling, or pain. Bleeding from the site. Warmth. Pus or a bad smell. General instructions Take over-the-counter and prescription medicines only as told by your health care provider. Rest as told by your health care provider. Return to your normal activities as told by your health care provider. Keep all follow-up visits as told by your health care provider. This is important. Contact a health care provider if: You have itching or red, swollen areas of skin (hives). You feel anxious. You feel weak after doing your normal activities. You have redness, swelling, warmth, or pain around the IV insertion site. You have blood coming  from the IV insertion site that does not stop with pressure. You have pus or a bad smell coming from your IV insertion site. Get help right away if: You have symptoms of a serious allergic or immune system reaction, including: Trouble breathing or shortness of breath. Swelling of the face or feeling flushed. Fever or chills. Pain in the head, back, or chest. Dark urine or blood in the urine. Widespread rash. Fast heartbeat. Feeling dizzy or light-headed. If you receive your blood transfusion in an outpatient setting, you will betold whom to contact to report any reactions. These symptoms may represent a serious problem that is an emergency. Do not wait to see if the symptoms will go away. Get medical help right away. Call your local emergency services (911 in the [place] Do not drive yourself to the hospital. Summary Bruising and tenderness around the IV insertion site are common. Check your IV insertion site every day for signs of infection. Rest as told by your health care provider. Return to your normal activities as told by your health care provider. Get help right away for symptoms of a serious allergic or immune system reaction to blood transfusion. This information is not intended to replace advice given to you by your health care provider. Make sure you discuss any questions you have with your healthcare provider. Document Revised: 02/14/2019 Document Reviewed: 02/14/2019 [employer]vier Patient Education  2022 [employer]  

## 2021-02-21 LAB — TYPE AND SCREEN
ABO/RH(D): O POS
Antibody Screen: NEGATIVE
Unit division: 0
Unit division: 0

## 2021-02-21 LAB — BPAM RBC
Blood Product Expiration Date: 202207182359
Blood Product Expiration Date: 202207182359
ISSUE DATE / TIME: 202206180832
ISSUE DATE / TIME: 202206180832
Unit Type and Rh: 5100
Unit Type and Rh: 5100

## 2021-02-21 LAB — PREPARE PLATELET PHERESIS: Unit division: 0

## 2021-02-21 LAB — BPAM PLATELET PHERESIS
Blood Product Expiration Date: 202206182359
ISSUE DATE / TIME: 202206180831
Unit Type and Rh: 5100

## 2021-02-23 ENCOUNTER — Encounter (HOSPITAL_BASED_OUTPATIENT_CLINIC_OR_DEPARTMENT_OTHER): Payer: Medicare Other | Attending: Internal Medicine | Admitting: Internal Medicine

## 2021-02-23 ENCOUNTER — Other Ambulatory Visit: Payer: Self-pay

## 2021-02-23 DIAGNOSIS — L89152 Pressure ulcer of sacral region, stage 2: Secondary | ICD-10-CM | POA: Insufficient documentation

## 2021-02-23 DIAGNOSIS — C8510 Unspecified B-cell lymphoma, unspecified site: Secondary | ICD-10-CM | POA: Insufficient documentation

## 2021-02-23 DIAGNOSIS — I429 Cardiomyopathy, unspecified: Secondary | ICD-10-CM | POA: Diagnosis not present

## 2021-02-23 DIAGNOSIS — I509 Heart failure, unspecified: Secondary | ICD-10-CM | POA: Insufficient documentation

## 2021-02-23 DIAGNOSIS — E11622 Type 2 diabetes mellitus with other skin ulcer: Secondary | ICD-10-CM | POA: Insufficient documentation

## 2021-02-23 NOTE — Progress Notes (Signed)
Rodney Lee ([id number]) , Visit Report for 02/23/2021 Allergy List Details Patient Name: Date of Service: Rodney Lee, Rodney Lee 02/23/2021 2:45 PM Medical Record Number: [id number] Patient Account Number: [id number] Date of Birth/Sex: Treating RN: [birth year] (80 y.o. [name] Primary Care Azadeh Hyder: Simona Huh Other Clinician: Referring Aleph Nickson: Treating Vestal Crandall/Extender: Jonetta Speak in Treatment: 0 Allergies Active Allergies canagliflozin atorvastatin calcium Allergy Notes Electronic Signature(s) Signed: 02/23/2021 4:28:34 PM By: Lorrin Jackson Entered By: Lorrin Jackson on 02/23/2021 14:48:45 -------------------------------------------------------------------------------- Arrival Information Details Patient Name: Date of Service: Rodney Lee 02/23/2021 2:45 PM Medical Record Number: [id number] Patient Account Number: [id number] Date of Birth/Sex: Treating RN: June 09, 1941 (79 y.o. [name] Primary Care Cahlil Sattar: Simona Huh Other Clinician: Referring Ferguson Gertner: Treating Shawnelle Spoerl/Extender: Jonetta Speak in Treatment: 0 Visit Information Patient Arrived: Wheel Chair Arrival Time: 14:39 Accompanied By: wife Transfer Assistance: None Patient Identification Verified: Yes Secondary Verification Process Completed: Yes Patient Requires Transmission-Based Precautions: No Patient Has Alerts: No Electronic Signature(s) Signed: 02/23/2021 4:28:34 PM By: Lorrin Jackson Entered By: Lorrin Jackson on 02/23/2021 14:46:13 -------------------------------------------------------------------------------- Clinic Level of Care Assessment Details Patient Name: Date of Service: Rodney Lee, Rodney Lee 02/23/2021 2:45 PM Medical Record Number: [id number] Patient Account Number: [id number] Date of Birth/Sex: Treating RN: [birth year] (80 y.o. [name], [name] Primary Care Danell Vazquez: Simona Huh Other Clinician: Referring  Samary Shatz: Treating Careli Luzader/Extender: Jonetta Speak in Treatment: 0 Clinic Level of Care Assessment Items TOOL 1 Quantity Score X- 1 0 Use when EandM and Procedure is performed on INITIAL visit ASSESSMENTS - Nursing Assessment / Reassessment X- 1 20 General Physical Exam (combine w/ comprehensive assessment (listed just below) when performed on new pt. evals) X- 1 25 Comprehensive Assessment (HX, ROS, Risk Assessments, Wounds Hx, etc.) ASSESSMENTS - Wound and Skin Assessment / Reassessment X- 1 10 Dermatologic / Skin Assessment (not related to wound area) ASSESSMENTS - Ostomy and/or Continence Assessment and Care []  - 0 Incontinence Assessment and Management []  - 0 Ostomy Care Assessment and Management (repouching, etc.) PROCESS - Coordination of Care X - Simple Patient / Family Education for ongoing care 1 15 []  - 0 Complex (extensive) Patient / Family Education for ongoing care X- 1 10 Staff obtains Programmer, systems, Records, T Results / Process Orders est []  - 0 Staff telephones HHA, Nursing Homes / Clarify orders / etc []  - 0 Routine Transfer to another Facility (non-emergent condition) []  - 0 Routine Hospital Admission (non-emergent condition) X- 1 15 New Admissions / Biomedical engineer / Ordering NPWT Apligraf, etc. , []  - 0 Emergency Hospital Admission (emergent condition) PROCESS - Special Needs []  - 0 Pediatric / Minor Patient Management []  - 0 Isolation Patient Management []  - 0 Hearing / Language / Visual special needs []  - 0 Assessment of Community assistance (transportation, D/C planning, etc.) []  - 0 Additional assistance / Altered mentation []  - 0 Support Surface(s) Assessment (bed, cushion, seat, etc.) INTERVENTIONS - Miscellaneous []  - 0 External ear exam []  - 0 Patient Transfer (multiple staff / Civil Service fast streamer / Similar devices) []  - 0 Simple Staple / Suture removal (25 or less) []  - 0 Complex Staple / Suture removal (26 or  more) []  - 0 Hypo/Hyperglycemic Management (do not check if billed separately) []  - 0 Ankle / Brachial Index (ABI) - do not check if billed separately Has the patient been seen at the hospital within the last three years: Yes Total Score: 95 Level Of Care: New/Established -  Level 3 Electronic Signature(s) Signed: 02/23/2021 5:26:08 PM By: Rhae Hammock RN Entered By: Rhae Hammock on 02/23/2021 15:31:12 -------------------------------------------------------------------------------- Encounter Discharge Information Details Patient Name: Date of Service: Rodney Lee. 02/23/2021 2:45 PM Medical Record Number: [id number] Patient Account Number: [id number] Date of Birth/Sex: Treating RN: 23-Jul-1941 (79 y.o. [name] Primary Care Madelene Kaatz: Simona Huh Other Clinician: Referring Gillian Meeuwsen: Treating Delwyn Scoggin/Extender: Jonetta Speak in Treatment: 0 Encounter Discharge Information Items Post Procedure Vitals Discharge Condition: Stable Temperature (F): 98.6 Ambulatory Status: Wheelchair Pulse (bpm): 71 Discharge Destination: Home Respiratory Rate (breaths/min): 18 Transportation: Private Auto Blood Pressure (mmHg): 102/56 Accompanied By: Wife Schedule Follow-up Appointment: Yes Clinical Summary of Care: Provided on 02/23/2021 Form Type Recipient Paper Patient Patient Electronic Signature(s) Signed: 02/23/2021 3:32:08 PM By: Lorrin Jackson Entered By: Lorrin Jackson on 02/23/2021 15:32:08 -------------------------------------------------------------------------------- Lower Extremity Assessment Details Patient Name: Date of Service: Rodney Lee 02/23/2021 2:45 PM Medical Record Number: [id number] Patient Account Number: [id number] Date of Birth/Sex: Treating RN: 1941-08-31 (79 y.o. [name] Primary Care Demira Gwynne: Simona Huh Other Clinician: Referring Torian Thoennes: Treating Karell Tukes/Extender: Jonetta Speak in Treatment: 0 Notes N/A-Buttock Wound Electronic Signature(s) Signed: 02/23/2021 4:28:34 PM By: Lorrin Jackson Entered By: Lorrin Jackson on 02/23/2021 15:01:05 -------------------------------------------------------------------------------- Multi Wound Chart Details Patient Name: Date of Service: Rodney Lee 02/23/2021 2:45 PM Medical Record Number: [id number] Patient Account Number: [id number] Date of Birth/Sex: Treating RN: 1941/09/03 (79 y.o. [name], [name] Primary Care Teodoro Jeffreys: Simona Huh Other Clinician: Referring Norberto Wishon: Treating Lorenna Lurry/Extender: Jonetta Speak in Treatment: 0 Vital Signs Height(in): 59 Pulse(bpm): 63 Weight(lbs): 196 Blood Pressure(mmHg): 102/56 Body Mass Index(BMI): 29 Temperature(F): 98.6 Respiratory Rate(breaths/min): 18 Photos: [N/A:N/A] Sacrum N/A N/A Wound Location: Pressure Injury N/A N/A Wounding Event: Pressure Ulcer N/A N/A Primary Etiology: Anemia, Arrhythmia, Congestive Heart N/A N/A Comorbid History: Failure, Coronary Artery Disease, Hypertension, Myocardial Infarction, Type II Diabetes, Received Chemotherapy 02/15/2021 N/A N/A Date Acquired: 0 N/A N/A Weeks of Treatment: Open N/A N/A Wound Status: 1.4x1.2x0.1 N/A N/A Measurements L x W x D (cm) 1.319 N/A N/A A (cm) : rea 0.132 N/A N/A Volume (cm) : 0.00% N/A N/A % Reduction in A rea: 0.00% N/A N/A % Reduction in Volume: Category/Stage II N/A N/A Classification: Medium N/A N/A Exudate A mount: Serosanguineous N/A N/A Exudate Type: red, brown N/A N/A Exudate Color: Distinct, outline attached N/A N/A Wound Margin: Medium (34-66%) N/A N/A Granulation A mount: Red, Pink N/A N/A Granulation Quality: Medium (34-66%) N/A N/A Necrotic A mount: Eschar N/A N/A Necrotic Tissue: Fat Layer (Subcutaneous Tissue): Yes N/A N/A Exposed Structures: Fascia: No Tendon: No Muscle: No Joint: No Bone: No None N/A  N/A Epithelialization: Debridement - Excisional N/A N/A Debridement: Pre-procedure Verification/Time Out 15:24 N/A N/A Taken: Lidocaine N/A N/A Pain Control: Subcutaneous, Slough N/A N/A Tissue Debrided: Skin/Subcutaneous Tissue N/A N/A Level: 1.68 N/A N/A Debridement A (sq cm): rea Curette N/A N/A Instrument: Minimum N/A N/A Bleeding: Pressure N/A N/A Hemostasis A chieved: 0 N/A N/A Procedural Pain: 0 N/A N/A Post Procedural Pain: Procedure was tolerated well N/A N/A Debridement Treatment Response: 1.4x1.2x0.1 N/A N/A Post Debridement Measurements L x W x D (cm) 0.132 N/A N/A Post Debridement Volume: (cm) Category/Stage II N/A N/A Post Debridement Stage: Debridement N/A N/A Procedures Performed: Treatment Notes Wound #1 (Sacrum) Cleanser Soap and Water Discharge Instruction: May shower and wash wound with dial antibacterial soap and water prior to dressing change. Wound Cleanser Discharge Instruction: Cleanse  the wound with wound cleanser prior to applying a clean dressing using gauze sponges, not tissue or cotton balls. Peri-Wound Care Skin Prep Discharge Instruction: Use skin prep as directed Topical Primary Dressing KerraCel Ag Gelling Fiber Dressing, 4x5 in (silver alginate) Discharge Instruction: Apply silver alginate to wound bed as instructed Secondary Dressing Bordered Gauze, 4x4 in Discharge Instruction: Apply over primary dressing as directed. Secured With Compression Wrap Compression Stockings Environmental education officer) Signed: 02/23/2021 4:29:36 PM By: Linton Ham MD Signed: 02/23/2021 5:26:08 PM By: Rhae Hammock RN Entered By: Linton Ham on 02/23/2021 15:54:49 -------------------------------------------------------------------------------- Multi-Disciplinary Care Plan Details Patient Name: Date of Service: Rodney Lee. 02/23/2021 2:45 PM Medical Record Number: [id number] Patient Account Number: [id number] Date of  Birth/Sex: Treating RN: 1941-06-17 (79 y.o. [name], [name] Primary Care Liyah Higham: Simona Huh Other Clinician: Referring Dillon Mcreynolds: Treating Costella Schwarz/Extender: Jonetta Speak in Treatment: 0 Active Inactive Orientation to the Wound Care Program Nursing Diagnoses: Knowledge deficit related to the wound healing center program Goals: Patient/caregiver will verbalize understanding of the [place] Date Initiated: 02/23/2021 Target Resolution Date: 03/11/2021 Goal Status: Active Interventions: Provide education on orientation to the wound center Notes: Wound/Skin Impairment Nursing Diagnoses: Impaired tissue integrity Knowledge deficit related to ulceration/compromised skin integrity Goals: Patient will have a decrease in wound volume by X% from date: (specify in notes) Date Initiated: 02/23/2021 Target Resolution Date: 03/12/2021 Goal Status: Active Patient/caregiver will verbalize understanding of skin care regimen Date Initiated: 02/23/2021 Target Resolution Date: 03/11/2021 Goal Status: Active Ulcer/skin breakdown will have a volume reduction of 30% by week 4 Date Initiated: 02/23/2021 Target Resolution Date: 03/11/2021 Goal Status: Active Interventions: Assess patient/caregiver ability to obtain necessary supplies Assess patient/caregiver ability to perform ulcer/skin care regimen upon admission and as needed Assess ulceration(s) every visit Provide education on ulcer and skin care Notes: Electronic Signature(s) Signed: 02/23/2021 5:26:08 PM By: Rhae Hammock RN Entered By: Rhae Hammock on 02/23/2021 14:53:44 -------------------------------------------------------------------------------- Pain Assessment Details Patient Name: Date of Service: Rodney Lee 02/23/2021 2:45 PM Medical Record Number: [id number] Patient Account Number: [id number] Date of Birth/Sex: Treating RN: 08-Jul-1941 (80 y.o. [name] Primary Care Areli Frary: Simona Huh Other Clinician: Referring Durk Carmen: Treating Jalen Oberry/Extender: Jonetta Speak in Treatment: 0 Active Problems Location of Pain Severity and Description of Pain Patient Has Paino No Site Locations Duration of the Pain. Constant / Intermittento Intermittent Rate the pain. Current Pain Level: 4 Character of Pain Describe the Pain: Tender, Throbbing Pain Management and Medication Current Pain Management: Medication: Yes Cold Application: No Rest: Yes Massage: No Activity: No T.E.N.S.: No Heat Application: No Leg drop or elevation: No Is the Current Pain Management Adequate: Inadequate How does your wound impact your activities of daily livingo Sleep: Yes Bathing: No Appetite: No Relationship With Others: No Bladder Continence: No Emotions: No Bowel Continence: No Work: No Toileting: No Drive: No Dressing: No Hobbies: No Electronic Signature(s) Signed: 02/23/2021 4:28:34 PM By: Lorrin Jackson Entered By: Lorrin Jackson on 02/23/2021 15:10:29 -------------------------------------------------------------------------------- Patient/Caregiver Education Details Patient Name: Date of Service: Rodney Lee 6/21/2022andnbsp2:45 PM Medical Record Number: [id number] Patient Account Number: [id number] Date of Birth/Gender: Treating RN: 1940/09/25 (80 y.o. [name] Primary Care Physician: Simona Huh Other Clinician: Referring Physician: Treating Physician/Extender: Jonetta Speak in Treatment: 0 Education Assessment Education Provided To: Patient Education Topics Provided Welcome T [facility] [place]: o Methods: Explain/Verbal Responses: State content correctly Electronic Signature(s) Signed: 02/23/2021 5:26:08  PM By: Rhae Hammock RN Entered By: Rhae Hammock on 02/23/2021  14:53:52 -------------------------------------------------------------------------------- Wound Assessment Details Patient Name: Date of Service: Rodney Lee, Rodney Lee 02/23/2021 2:45 PM Medical Record Number: [id number] Patient Account Number: [id number] Date of Birth/Sex: Treating RN: May 23, 1941 (80 y.o. [name] Primary Care Maribel Hadley: Simona Huh Other Clinician: Referring Janice Seales: Treating Daman Steffenhagen/Extender: Jonetta Speak in Treatment: 0 Wound Status Wound Number: 1 Primary Pressure Ulcer Etiology: Wound Location: Sacrum Wound Open Wounding Event: Pressure Injury Status: Date Acquired: 02/15/2021 Comorbid Anemia, Arrhythmia, Congestive Heart Failure, Coronary Artery Weeks Of Treatment: 0 History: Disease, Hypertension, Myocardial Infarction, Type II Diabetes, Clustered Wound: No Received Chemotherapy Photos Wound Measurements Length: (cm) 1.4 Width: (cm) 1.2 Depth: (cm) 0.1 Area: (cm) 1.319 Volume: (cm) 0.132 % Reduction in Area: 0% % Reduction in Volume: 0% Epithelialization: None Tunneling: No Undermining: No Wound Description Classification: Category/Stage II Wound Margin: Distinct, outline attached Exudate Amount: Medium Exudate Type: Serosanguineous Exudate Color: red, brown Foul Odor After Cleansing: No Slough/Fibrino No Wound Bed Granulation Amount: Medium (34-66%) Exposed Structure Granulation Quality: Red, Pink Fascia Exposed: No Necrotic Amount: Medium (34-66%) Fat Layer (Subcutaneous Tissue) Exposed: Yes Necrotic Quality: Eschar Tendon Exposed: No Muscle Exposed: No Joint Exposed: No Bone Exposed: No Treatment Notes Wound #1 (Sacrum) Cleanser Soap and Water Discharge Instruction: May shower and wash wound with dial antibacterial soap and water prior to dressing change. Wound Cleanser Discharge Instruction: Cleanse the wound with wound cleanser prior to applying a clean dressing using gauze sponges, not  tissue or cotton balls. Peri-Wound Care Skin Prep Discharge Instruction: Use skin prep as directed Topical Primary Dressing KerraCel Ag Gelling Fiber Dressing, 4x5 in (silver alginate) Discharge Instruction: Apply silver alginate to wound bed as instructed Secondary Dressing Bordered Gauze, 4x4 in Discharge Instruction: Apply over primary dressing as directed. Secured With Compression Wrap Compression Stockings Environmental education officer) Signed: 02/23/2021 4:19:49 PM By: Sandre Kitty Signed: 02/23/2021 4:28:34 PM By: Lorrin Jackson Entered By: Sandre Kitty on 02/23/2021 15:33:52 -------------------------------------------------------------------------------- [place] Details Patient Name: Date of Service: Rodney Lee. 02/23/2021 2:45 PM Medical Record Number: [id number] Patient Account Number: [id number] Date of Birth/Sex: Treating RN: 11/27/1940 (80 y.o. [name] Primary Care Abdulla Pooley: Simona Huh Other Clinician: Referring Maliik Karner: Treating Lannis Lichtenwalner/Extender: Jonetta Speak in Treatment: 0 Vital Signs Time Taken: 14:45 Temperature (F): 98.6 Height (in): 69 Pulse (bpm): 71 Source: Stated Respiratory Rate (breaths/min): 18 Weight (lbs): 196 Blood Pressure (mmHg): 102/56 Source: Stated Reference Range: 80 - 120 mg / dl Body Mass Index (BMI): 28.9 Electronic Signature(s) Signed: 02/23/2021 4:28:34 PM By: Lorrin Jackson Entered By: Lorrin Jackson on 02/23/2021 14:47:30

## 2021-02-23 NOTE — Progress Notes (Addendum)
Rodney Lee ([id number]) , Visit Report for 02/23/2021 Chief Complaint Document Details Patient Name: Date of Service: Rodney Lee, Rodney Lee 02/23/2021 2:45 PM Medical Record Number: [id number] Patient Account Number: [id number] Date of Birth/Sex: Treating RN: [birth year] (80 y.o. Rodney Lee Primary Care Provider: Simona Lee Other Clinician: Referring Provider: Treating Provider/Extender: Rodney Lee in Treatment: 0 Information Obtained from: Patient Chief Complaint 02/23/2021; patient is here for review of his sacral ulcer Electronic Signature(s) Signed: 02/23/2021 4:29:36 PM By: Rodney Ham MD Entered By: Rodney Lee on 02/23/2021 15:56:06 -------------------------------------------------------------------------------- Debridement Details Patient Name: Date of Service: Rodney Lee 02/23/2021 2:45 PM Medical Record Number: [id number] Patient Account Number: [id number] Date of Birth/Sex: Treating RN: Jul 26, 1941 (79 y.o. Rodney Lee, Rodney Lee Primary Care Provider: Simona Lee Other Clinician: Referring Provider: Treating Provider/Extender: Rodney Lee in Treatment: 0 Debridement Performed for Assessment: Wound #1 Sacrum Performed By: Physician Rodney Lee., MD Debridement Type: Debridement Level of Consciousness (Pre-procedure): Awake and Alert Pre-procedure Verification/Time Out Yes - 15:24 Taken: Start Time: 15:24 Pain Control: Lidocaine T Area Debrided (L x W): otal 1.4 (cm) x 1.2 (cm) = 1.68 (cm) Tissue and other material debrided: Viable, Non-Viable, Slough, Subcutaneous, Skin: Dermis , Skin: Epidermis, Slough Level: Skin/Subcutaneous Tissue Debridement Description: Excisional Instrument: Curette Bleeding: Minimum Hemostasis Achieved: Pressure End Time: 15:24 Procedural Pain: 0 Post Procedural Pain: 0 Response to Treatment: Procedure was tolerated well Level of  Consciousness (Post- Awake and Alert procedure): Post Debridement Measurements of Total Wound Length: (cm) 1.4 Stage: Category/Stage II Width: (cm) 1.2 Depth: (cm) 0.1 Volume: (cm) 0.132 Character of Wound/Ulcer Post Debridement: Improved Post Procedure Diagnosis Same as Pre-procedure Electronic Signature(s) Signed: 02/23/2021 4:29:36 PM By: Rodney Ham MD Signed: 02/23/2021 5:26:08 PM By: Rodney Hammock RN Entered By: Rodney Lee on 02/23/2021 15:55:00 -------------------------------------------------------------------------------- HPI Details Patient Name: Date of Service: Rodney Lee 02/23/2021 2:45 PM Medical Record Number: [id number] Patient Account Number: [id number] Date of Birth/Sex: Treating RN: [birth year] (80 y.o. Rodney Lee Primary Care Provider: Simona Lee Other Clinician: Referring Provider: Treating Provider/Extender: Rodney Lee in Treatment: 0 History of Present Illness HPI Description: ADMISSION 02/23/2021 This is a pleasant 80 year old man who arrived with his wife. He has been diagnosed this year with high-grade B-cell lymphoma stage III/IV. CT scan of the abdomen pelvis showed a soft tissue mass at 6.7 cm. Punch biopsy was consistent with a high-grade B-cell lymphoma. He is starting chemotherapy. His oncologist is Dr. Lorenso Lee. PET scan showed diffuse disease along the anterior abdominal wall pulmonary nodule lesions along the cortex of the left and right kidney. He is being treated with RGCVP. The patient feels generally weak. He spends most of his day in a recliner. His next chemotherapy is this Thursday and he will be back in a recliner. Noted by home health to have a wound on the lower sacrum/coccyx just to the right of the midline. They have been applying Vaseline or Vaseline gauze I did look at his wound on his abdomen which simply had an ABD pad over it. This is a malignant wound by imaging and  biopsy. A large necrotic area. Seems to have minimal drainage however. No evidence of infection around this. Past medical history includes B-cell lymphoma as described, cardiomyopathy, CHF, history of V. tach Electronic Signature(s) Signed: 02/23/2021 4:29:36 PM By: Rodney Ham MD Entered By: Rodney Lee on 02/23/2021 16:00:59 -------------------------------------------------------------------------------- Physical Exam Details Patient Name: Date of  Service: Rodney Lee, Rodney Lee 02/23/2021 2:45 PM Medical Record Number: [id number] Patient Account Number: [id number] Date of Birth/Sex: Treating RN: 31-Jul-1941 (79 y.o. Rodney Lee Primary Care Provider: Simona Lee Other Clinician: Referring Provider: Treating Provider/Extender: Rodney Lee in Treatment: 0 Constitutional Sitting or standing Blood Pressure is within target range for patient.. Pulse regular and within target range for patient.Marland Kitchen Respirations regular, non-labored and within target range.. Temperature is normal and within the target range for the patient.Marland Kitchen Appears in no distress. Integumentary (Hair, Skin) Large necrotic mass on the anterior abdominal wall mid aspect. Psychiatric appears at normal baseline. Notes Wound exam; the area in question is at the level of the coccyx just to the right of midline. Necrotic debris around the surface removed with a #3 curette to clean off the surface of the wound hemostasis was with direct pressure. This cleans up quite nicely there is no evidence of infection Electronic Signature(s) Signed: 02/23/2021 4:29:36 PM By: Rodney Ham MD Entered By: Rodney Lee on 02/23/2021 16:01:57 -------------------------------------------------------------------------------- Physician Orders Details Patient Name: Date of Service: Rodney Lee 02/23/2021 2:45 PM Medical Record Number: [id number] Patient Account Number: [id number] Date of  Birth/Sex: Treating RN: 01/31/1941 (80 y.o. Rodney Lee Primary Care Provider: Simona Lee Other Clinician: Referring Provider: Treating Provider/Extender: Rodney Lee in Treatment: 0 Verbal / Phone Orders: No Diagnosis Coding Follow-up Appointments ppointment in 1 week. - Rodney Lee Return A Bathing/ Shower/ Hygiene May shower with protection but do not get wound dressing(s) wet. Off-Loading Roho cushion for wheelchair - or for any chair when sitting Turn and reposition every 2 hours Wound Treatment Wound #1 - Sacrum Cleanser: Soap and Water 1 x Per Day/7 Days Discharge Instructions: May shower and wash wound with dial antibacterial soap and water prior to dressing change. Cleanser: Wound Cleanser (DME) (Generic) 1 x Per Day/7 Days Discharge Instructions: Cleanse the wound with wound cleanser prior to applying a clean dressing using gauze sponges, not tissue or cotton balls. Peri-Wound Care: Skin Prep (DME) (Generic) 1 x Per Day/7 Days Discharge Instructions: Use skin prep as directed Prim Dressing: KerraCel Ag Gelling Fiber Dressing, 4x5 in (silver alginate) (DME) (Generic) 1 x Per Day/7 Days ary Discharge Instructions: Apply silver alginate to wound bed as instructed Secondary Dressing: Bordered Gauze, 4x4 in (DME) (Generic) 1 x Per Day/7 Days Discharge Instructions: Apply over primary dressing as directed. Electronic Signature(s) Signed: 02/23/2021 4:29:36 PM By: Rodney Ham MD Signed: 02/23/2021 5:26:08 PM By: Rodney Hammock RN Entered By: Rodney Lee on 02/23/2021 15:30:36 -------------------------------------------------------------------------------- Problem List Details Patient Name: Date of Service: Rodney Lee 02/23/2021 2:45 PM Medical Record Number: [id number] Patient Account Number: [id number] Date of Birth/Sex: Treating RN: February 13, 1941 (79 y.o. Rodney Lee, Rodney Lee Primary Care Provider: Simona Lee Other Clinician: Referring Provider: Treating Provider/Extender: Rodney Lee in Treatment: 0 Active Problems ICD-10 Encounter Code Description Active Date MDM Diagnosis L89.152 Pressure ulcer of sacral region, stage 2 02/23/2021 No Yes C85.10 Unspecified B-cell lymphoma, unspecified site 02/23/2021 No Yes Inactive Problems Resolved Problems Electronic Signature(s) Signed: 02/23/2021 4:29:36 PM By: Rodney Ham MD Entered By: Rodney Lee on 02/23/2021 15:54:40 -------------------------------------------------------------------------------- Progress Note Details Patient Name: Date of Service: Rodney Lee 02/23/2021 2:45 PM Medical Record Number: [id number] Patient Account Number: [id number] Date of Birth/Sex: Treating RN: [birth year] (79 y.o. Rodney Lee Primary Care Provider: Simona Lee Other Clinician: Referring Provider: Treating Provider/Extender: Rodney Lee  Ehinger, [place] Jansky Weeks in Treatment: 0 Subjective Chief Complaint Information obtained from Patient 02/23/2021; patient is here for review of his sacral ulcer History of Present Illness (HPI) ADMISSION 02/23/2021 This is a pleasant 80 year old man who arrived with his wife. He has been diagnosed this year with high-grade B-cell lymphoma stage III/IV. CT scan of the abdomen pelvis showed a soft tissue mass at 6.7 cm. Punch biopsy was consistent with a high-grade B-cell lymphoma. He is starting chemotherapy. His oncologist is Dr. Lorenso Lee. PET scan showed diffuse disease along the anterior abdominal wall pulmonary nodule lesions along the cortex of the left and right kidney. He is being treated with RooGCVP. The patient feels generally weak. He spends most of his day in a recliner. His next chemotherapy is this Thursday and he will be back in a recliner. Noted by home health to have a wound on the lower sacrum/coccyx just to the right of the midline. They have  been applying Vaseline or Vaseline gauze I did look at his wound on his abdomen which simply had an ABD pad over it. This is a malignant wound by imaging and biopsy. A large necrotic area. Seems to have minimal drainage however. No evidence of infection around this. Past medical history includes B-cell lymphoma as described, cardiomyopathy, CHF, history of V. tach Patient History Information obtained from Patient. Allergies canagliflozin, atorvastatin calcium Family History Diabetes - Mother,Child, Heart Disease - Mother, Hypertension - Mother, No family history of Cancer, Hereditary Spherocytosis, Kidney Disease, Lung Disease, Seizures, Stroke, Thyroid Problems, Tuberculosis. Social History Never smoker, Marital Status - Married, Alcohol Use - Never, Drug Use - No History, Caffeine Use - Rarely. Medical History Hematologic/Lymphatic Patient has history of Anemia Cardiovascular Patient has history of Arrhythmia, Congestive Heart Failure, Coronary Artery Disease, Hypertension, Myocardial Infarction Endocrine Patient has history of Type II Diabetes Oncologic Patient has history of Received Chemotherapy Patient is treated with Oral Agents. Blood sugar is tested. Medical A Surgical History Notes nd Genitourinary CKD Oncologic High Grade B Cell Lymphoma Stage 3/4 Review of Systems (ROS) Eyes Complains or has symptoms of Glasses / Contacts. Ear/Nose/Mouth/Throat Denies complaints or symptoms of Chronic sinus problems or rhinitis. Respiratory Denies complaints or symptoms of Chronic or frequent coughs, Shortness of Breath. Gastrointestinal Complains or has symptoms of Frequent diarrhea. Integumentary (Skin) Complains or has symptoms of Wounds. Musculoskeletal Denies complaints or symptoms of Muscle Pain, Muscle Weakness. Neurologic Denies complaints or symptoms of Numbness/parasthesias. Psychiatric Denies complaints or symptoms of Claustrophobia,  Suicidal. Objective Constitutional Sitting or standing Blood Pressure is within target range for patient.. Pulse regular and within target range for patient.Marland Kitchen Respirations regular, non-labored and within target range.. Temperature is normal and within the target range for the patient.Marland Kitchen Appears in no distress. Vitals Time Taken: 2:45 PM, Height: 69 in, Source: Stated, Weight: 196 lbs, Source: Stated, BMI: 28.9, Temperature: 98.6 F, Pulse: 71 bpm, Respiratory Rate: 18 breaths/min, Blood Pressure: 102/56 mmHg. Psychiatric appears at normal baseline. General Notes: Wound exam; the area in question is at the level of the coccyx just to the right of midline. Necrotic debris around the surface removed with a #3 curette to clean off the surface of the wound hemostasis was with direct pressure. This cleans up quite nicely there is no evidence of infection Integumentary (Hair, Skin) Large necrotic mass on the anterior abdominal wall mid aspect. Wound #1 status is Open. Original cause of wound was Pressure Injury. The date acquired was: 02/15/2021. The wound is located on the Sacrum. The  wound measures 1.4cm length x 1.2cm width x 0.1cm depth; 1.319cm^2 area and 0.132cm^3 volume. There is Fat Layer (Subcutaneous Tissue) exposed. There is no tunneling or undermining noted. There is a medium amount of serosanguineous drainage noted. The wound margin is distinct with the outline attached to the wound base. There is medium (34-66%) red, pink granulation within the wound bed. There is a medium (34-66%) amount of necrotic tissue within the wound bed including Eschar. Assessment Active Problems ICD-10 Pressure ulcer of sacral region, stage 2 Unspecified B-cell lymphoma, unspecified site Procedures Wound #1 Pre-procedure diagnosis of Wound #1 is a Pressure Ulcer located on the Sacrum . There was a Excisional Skin/Subcutaneous Tissue Debridement with a total area of 1.68 sq cm performed by Rodney Lee.,  MD. With the following instrument(s): Curette to remove Viable and Non-Viable tissue/material. Material removed includes Subcutaneous Tissue, Slough, Skin: Dermis, and Skin: Epidermis after achieving pain control using Lidocaine. No specimens were taken. A time out was conducted at 15:24, prior to the start of the procedure. A Minimum amount of bleeding was controlled with Pressure. The procedure was tolerated well with a pain level of 0 throughout and a pain level of 0 following the procedure. Post Debridement Measurements: 1.4cm length x 1.2cm width x 0.1cm depth; 0.132cm^3 volume. Post debridement Stage noted as Category/Stage II. Character of Wound/Ulcer Post Debridement is improved. Post procedure Diagnosis Wound #1: Same as Pre-Procedure Plan Follow-up Appointments: Return Appointment in 1 week. - Rodney Lee Bathing/ Shower/ Hygiene: May shower with protection but do not get wound dressing(s) wet. Off-Loading: Roho cushion for wheelchair - or for any chair when sitting Turn and reposition every 2 hours WOUND #1: - Sacrum Wound Laterality: Cleanser: Soap and Water 1 x Per Day/7 Days Discharge Instructions: May shower and wash wound with dial antibacterial soap and water prior to dressing change. Cleanser: Wound Cleanser (DME) (Generic) 1 x Per Day/7 Days Discharge Instructions: Cleanse the wound with wound cleanser prior to applying a clean dressing using gauze sponges, not tissue or cotton balls. Peri-Wound Care: Skin Prep (DME) (Generic) 1 x Per Day/7 Days Discharge Instructions: Use skin prep as directed Prim Dressing: KerraCel Ag Gelling Fiber Dressing, 4x5 in (silver alginate) (DME) (Generic) 1 x Per Day/7 Days ary Discharge Instructions: Apply silver alginate to wound bed as instructed Secondary Dressing: Bordered Gauze, 4x4 in (DME) (Generic) 1 x Per Day/7 Days Discharge Instructions: Apply over primary dressing as directed. 1. We put silver alginate and bordered foam on the  area on his buttock. They will change this every 2 days. This should heal up fairly easily as long as he is careful to offload this. We went over the etiology of pressure ulcers and we went over what it would take to keep this area rigorously offloaded. Both his wife and the patient expressed understanding. 2. There is no evidence of any additional factors here he appears to be well-nourished. I think generally weak from his disease however. 3. We will see this again next week. Technically we are not managing the abdominal wound Electronic Signature(s) Signed: 02/23/2021 4:29:36 PM By: Rodney Ham MD Entered By: Rodney Lee on 02/23/2021 16:03:25 -------------------------------------------------------------------------------- HxROS Details Patient Name: Date of Service: Rodney Lee. 02/23/2021 2:45 PM Medical Record Number: [id number] Patient Account Number: [id number] Date of Birth/Sex: Treating RN: 09/08/1940 (80 y.o. Rodney Lee Primary Care Provider: Simona Lee Other Clinician: Referring Provider: Treating Provider/Extender: Rodney Lee in Treatment: 0 Information Obtained From Patient  Eyes Complaints and Symptoms: Positive for: Glasses / Contacts Ear/Nose/Mouth/Throat Complaints and Symptoms: Negative for: Chronic sinus problems or rhinitis Respiratory Complaints and Symptoms: Negative for: Chronic or frequent coughs; Shortness of Breath Gastrointestinal Complaints and Symptoms: Positive for: Frequent diarrhea Integumentary (Skin) Complaints and Symptoms: Positive for: Wounds Musculoskeletal Complaints and Symptoms: Negative for: Muscle Pain; Muscle Weakness Neurologic Complaints and Symptoms: Negative for: Numbness/parasthesias Psychiatric Complaints and Symptoms: Negative for: Claustrophobia; Suicidal Hematologic/Lymphatic Medical History: Positive for: Anemia Cardiovascular Medical History: Positive for:  Arrhythmia; Congestive Heart Failure; Coronary Artery Disease; Hypertension; Myocardial Infarction Endocrine Medical History: Positive for: Type II Diabetes Treated with: Oral agents Blood sugar tested every day: Yes Tested : Q am Genitourinary Medical History: Past Medical History Notes: CKD Immunological Oncologic Medical History: Positive for: Received Chemotherapy Past Medical History Notes: High Grade B Cell Lymphoma Stage 3/4 Immunizations Pneumococcal Vaccine: Received Pneumococcal Vaccination: Yes Implantable Devices Yes Family and Social History Cancer: No; Diabetes: Yes - Mother,Child; Heart Disease: Yes - Mother; Hereditary Spherocytosis: No; Hypertension: Yes - Mother; Kidney Disease: No; Lung Disease: No; Seizures: No; Stroke: No; Thyroid Problems: No; Tuberculosis: No; Never smoker; Marital Status - Married; Alcohol Use: Never; Drug Use: No History; Caffeine Use: Rarely; Financial Concerns: No; Food, Clothing or Shelter Needs: No; Support System Lacking: No; Transportation Concerns: No Electronic Signature(s) Signed: 02/23/2021 4:28:34 PM By: Lorrin Jackson Signed: 02/23/2021 4:29:36 PM By: Rodney Ham MD Entered By: Lorrin Jackson on 02/23/2021 14:55:18 -------------------------------------------------------------------------------- [place] Details Patient Name: Date of Service: Rodney Lee 02/23/2021 Medical Record Number: [id number] Patient Account Number: [id number] Date of Birth/Sex: Treating RN: 1940/12/09 (80 y.o. Rodney Lee Primary Care Provider: Simona Lee Other Clinician: Referring Provider: Treating Provider/Extender: Rodney Lee in Treatment: 0 Diagnosis Coding ICD-10 Codes Code Description [phone] Pressure ulcer of sacral region, stage 2 C85.10 Unspecified B-cell lymphoma, unspecified site Facility Procedures CPT4 Code: [id number] Description: [place] VISIT-LEV 3 EST  PT Modifier: Quantity: 1 CPT4 Code: [id number] Description: 40814 - DEB SUBQ TISSUE 20 SQ CM/< ICD-10 Diagnosis Description L89.152 Pressure ulcer of sacral region, stage 2 Modifier: Quantity: 1 Physician Procedures : CPT4 Code Description Modifier [id number] - WC PHYS LEVEL 2 - NEW PT 25 ICD-10 Diagnosis Description L89.152 Pressure ulcer of sacral region, stage 2 C85.10 Unspecified B-cell lymphoma, unspecified site Quantity: 1 : [id number] - WC PHYS SUBQ TISS 20 SQ CM ICD-10 Diagnosis Description L89.152 Pressure ulcer of sacral region, stage 2 Quantity: 1 Electronic Signature(s) Signed: 03/04/2021 5:19:12 PM By: Rodney Ham MD Signed: 09/23/2021 12:41:35 PM By: Rodney Hammock RN Previous Signature: 02/23/2021 4:29:36 PM Version By: Rodney Ham MD Entered By: Rodney Lee on 03/02/2021 10:44:58

## 2021-02-23 NOTE — Progress Notes (Signed)
Rodney Lee ([id number]) , Visit Report for 02/23/2021 Abuse/Suicide Risk Screen Details Patient Name: Date of Service: Rodney Lee, Rodney Lee 02/23/2021 2:45 PM Medical Record Number: [id number] Patient Account Number: [id number] Date of Birth/Sex: Treating RN: 05/09/1941 (80 y.o. Rodney Lee Primary Care Rodney Lee: Rodney Lee Other Clinician: Referring Rodney Lee: Treating Rodney Lee/Extender: Rodney Lee in Treatment: 0 Abuse/Suicide Risk Screen Items Answer ABUSE RISK SCREEN: Has anyone close to you tried to hurt or harm you recentlyo No Do you feel uncomfortable with anyone in your familyo No Has anyone forced you do things that you didnt want to doo No Electronic Signature(s) Signed: 02/23/2021 4:28:34 PM By: Rodney Lee Entered By: Rodney Lee on 02/23/2021 14:55:25 -------------------------------------------------------------------------------- Activities of Daily Living Details Patient Name: Date of Service: Rodney Lee, Rodney Lee 02/23/2021 2:45 PM Medical Record Number: [id number] Patient Account Number: [id number] Date of Birth/Sex: Treating RN: 1941-01-02 (80 y.o. Rodney Lee Primary Care Rodney Lee: Rodney Lee Other Clinician: Referring Kayen Grabel: Treating Rodney Lee/Extender: Rodney Lee in Treatment: 0 Activities of Daily Living Items Answer Activities of Daily Living (Please select one for each item) Drive Automobile Need Assistance T Medications ake Completely Able Use T elephone Completely Able Care for Appearance Completely Able Use T oilet Completely Able Bath / Shower Need Assistance Dress Self Completely Able Feed Self Completely Able Walk Need Assistance Get In / Out Bed Need Assistance Housework Need Assistance Prepare Meals Need Assistance Handle Money Need Assistance Shop for Self Need Assistance Electronic Signature(s) Signed: 02/23/2021 4:28:34 PM By: Rodney Lee Entered By:  Rodney Lee on 02/23/2021 14:56:18 -------------------------------------------------------------------------------- Education Screening Details Patient Name: Date of Service: Rodney Lee 02/23/2021 2:45 PM Medical Record Number: [id number] Patient Account Number: [id number] Date of Birth/Sex: Treating RN: [birth year] (79 y.o. Rodney Lee Primary Care Rodney Lee: Rodney Lee Other Clinician: Referring Rodney Lee: Treating Rodney Lee/Extender: Rodney Lee in Treatment: 0 Primary Learner Assessed: Patient Learning Preferences/Education Level/Primary Language Learning Preference: Explanation, Demonstration, Printed Material Highest Education Level: College or Above Preferred Language: English Cognitive Barrier Language Barrier: No Translator Needed: No Memory Deficit: No Emotional Barrier: No Cultural/Religious Beliefs Affecting Medical Care: No Physical Barrier Impaired Vision: Yes Glasses Impaired Hearing: No Decreased Hand dexterity: No Knowledge/Comprehension Knowledge Level: High Comprehension Level: High Ability to understand written instructions: High Ability to understand verbal instructions: High Motivation Anxiety Level: Calm Cooperation: Cooperative Education Importance: Acknowledges Need Interest in Health Problems: Asks Questions Perception: Coherent Willingness to Engage in Self-Management High Activities: Readiness to Engage in Self-Management High Activities: Electronic Signature(s) Signed: 02/23/2021 4:28:34 PM By: Rodney Lee Entered By: Rodney Lee on 02/23/2021 14:56:49 -------------------------------------------------------------------------------- Fall Risk Assessment Details Patient Name: Date of Service: Rodney Lee 02/23/2021 2:45 PM Medical Record Number: [id number] Patient Account Number: [id number] Date of Birth/Sex: Treating RN: 1941-01-21 (79 y.o. Rodney Lee Primary Care Johnpaul Gillentine:  Rodney Lee Other Clinician: Referring Rodney Lee: Treating Rodney Lee/Extender: Rodney Lee in Treatment: 0 Fall Risk Assessment Items Have you had 2 or more falls in the last 12 monthso 0 Yes Have you had any fall that resulted in injury in the last 12 monthso 0 No FALLS RISK SCREEN History of falling - immediate or within 3 months 25 Yes Secondary diagnosis (Do you have 2 or more medical diagnoseso) 0 No Ambulatory aid None/bed rest/wheelchair/nurse 0 No Crutches/cane/walker 15 Yes Furniture 0 No Intravenous therapy Access/Saline/Heparin Lock 0 No Gait/Transferring Normal/ bed rest/ wheelchair 0 No Weak (short steps  with or without shuffle, stooped but able to lift head while walking, may seek 10 Yes support from furniture) Impaired (short steps with shuffle, may have difficulty arising from chair, head down, impaired 0 No balance) Mental Status Oriented to own ability 0 Yes Electronic Signature(s) Signed: 02/23/2021 4:28:34 PM By: Rodney Lee Entered By: Rodney Lee on 02/23/2021 14:57:33 -------------------------------------------------------------------------------- Foot Assessment Details Patient Name: Date of Service: Rodney Lee 02/23/2021 2:45 PM Medical Record Number: [id number] Patient Account Number: [id number] Date of Birth/Sex: Treating RN: Aug 23, 1941 (80 y.o. Rodney Lee Primary Care Rodney Lee: Rodney Lee Other Clinician: Referring Rodney Lee: Treating Rodney Lee/Extender: Rodney Lee in Treatment: 0 Foot Assessment Items Site Locations + = Sensation present, - = Sensation absent, C = Callus, U = Ulcer R = Redness, W = Warmth, M = Maceration, PU = Pre-ulcerative lesion F = Fissure, S = Swelling, D = Dryness Assessment Right: Left: Other Deformity: No No Prior Foot Ulcer: No No Prior Amputation: No No Charcot Joint: No No Ambulatory Status: Ambulatory With Help Assistance Device:  Walker Gait: Steady Notes N/A: Buttock wound Electronic Signature(s) Signed: 02/23/2021 4:28:34 PM By: Rodney Lee Entered By: Rodney Lee on 02/23/2021 15:00:46 -------------------------------------------------------------------------------- Nutrition Risk Screening Details Patient Name: Date of Service: Rodney Lee, Rodney Lee 02/23/2021 2:45 PM Medical Record Number: [id number] Patient Account Number: [id number] Date of Birth/Sex: Treating RN: 1941/04/23 (80 y.o. Rodney Lee Primary Care Truett Mcfarlan: Rodney Lee Other Clinician: Referring Nohemy Koop: Treating Yarah Fuente/Extender: Rodney Lee in Treatment: 0 Height (in): 69 Weight (lbs): 196 Body Mass Index (BMI): 28.9 Nutrition Risk Screening Items Score Screening NUTRITION RISK SCREEN: I have an illness or condition that made me change the kind and/or amount of food I eat 2 Yes I eat fewer than two meals per day 0 No I eat few fruits and vegetables, or milk products 0 No I have three or more drinks of beer, liquor or wine almost every day 0 No I have tooth or mouth problems that make it hard for me to eat 0 No I don't always have enough money to buy the food I need 0 No I eat alone most of the time 0 No I take three or more different prescribed or over-the-counter drugs a day 1 Yes Without wanting to, I have lost or gained 10 pounds in the last six months 0 No I am not always physically able to shop, cook and/or feed myself 0 No Nutrition Protocols Good Risk Protocol 0 No interventions needed Moderate Risk Protocol High Risk Proctocol Risk Level: Moderate Risk Score: 3 Electronic Signature(s) Signed: 02/23/2021 4:28:34 PM By: Rodney Lee Entered By: Rodney Lee on 02/23/2021 14:59:37

## 2021-02-24 ENCOUNTER — Inpatient Hospital Stay: Payer: Medicare Other

## 2021-02-24 ENCOUNTER — Encounter: Payer: Self-pay | Admitting: Hematology and Oncology

## 2021-02-24 ENCOUNTER — Inpatient Hospital Stay: Payer: Medicare Other | Admitting: Hematology and Oncology

## 2021-02-24 VITALS — BP 92/51 | HR 69 | Temp 97.7°F | Resp 18 | Ht 71.0 in | Wt 190.8 lb

## 2021-02-24 DIAGNOSIS — D6481 Anemia due to antineoplastic chemotherapy: Secondary | ICD-10-CM | POA: Diagnosis not present

## 2021-02-24 DIAGNOSIS — D696 Thrombocytopenia, unspecified: Secondary | ICD-10-CM | POA: Diagnosis not present

## 2021-02-24 DIAGNOSIS — Z95828 Presence of other vascular implants and grafts: Secondary | ICD-10-CM

## 2021-02-24 DIAGNOSIS — E1165 Type 2 diabetes mellitus with hyperglycemia: Secondary | ICD-10-CM | POA: Diagnosis not present

## 2021-02-24 DIAGNOSIS — Z5189 Encounter for other specified aftercare: Secondary | ICD-10-CM | POA: Diagnosis not present

## 2021-02-24 DIAGNOSIS — C8510 Unspecified B-cell lymphoma, unspecified site: Secondary | ICD-10-CM

## 2021-02-24 DIAGNOSIS — S31809A Unspecified open wound of unspecified buttock, initial encounter: Secondary | ICD-10-CM | POA: Diagnosis not present

## 2021-02-24 DIAGNOSIS — Z5111 Encounter for antineoplastic chemotherapy: Secondary | ICD-10-CM | POA: Diagnosis not present

## 2021-02-24 DIAGNOSIS — C9000 Multiple myeloma not having achieved remission: Secondary | ICD-10-CM

## 2021-02-24 LAB — CMP (CANCER CENTER ONLY)
ALT: 18 U/L (ref 0–44)
AST: 23 U/L (ref 15–41)
Albumin: 2.6 g/dL — ABNORMAL LOW (ref 3.5–5.0)
Alkaline Phosphatase: 89 U/L (ref 38–126)
Anion gap: 7 (ref 5–15)
BUN: 21 mg/dL (ref 8–23)
CO2: 27 mmol/L (ref 22–32)
Calcium: 8.6 mg/dL — ABNORMAL LOW (ref 8.9–10.3)
Chloride: 99 mmol/L (ref 98–111)
Creatinine: 2.36 mg/dL — ABNORMAL HIGH (ref 0.61–1.24)
GFR, Estimated: 27 mL/min — ABNORMAL LOW (ref >60)
Glucose, Bld: 291 mg/dL — ABNORMAL HIGH (ref 70–99)
Potassium: 4.5 mmol/L (ref 3.5–5.1)
Sodium: 133 mmol/L — ABNORMAL LOW (ref 135–145)
Total Bilirubin: 0.4 mg/dL (ref 0.3–1.2)
Total Protein: 5.9 g/dL — ABNORMAL LOW (ref 6.5–8.1)

## 2021-02-24 LAB — CBC WITH DIFFERENTIAL (CANCER CENTER ONLY)
Abs Immature Granulocytes: 0.35 10*3/uL — ABNORMAL HIGH (ref 0.00–0.07)
Basophils Absolute: 0.1 10*3/uL (ref 0.0–0.1)
Basophils Relative: 2 %
Eosinophils Absolute: 0 10*3/uL (ref 0.0–0.5)
Eosinophils Relative: 1 %
HCT: 27.3 % — ABNORMAL LOW (ref 39.0–52.0)
Hemoglobin: 9.1 g/dL — ABNORMAL LOW (ref 13.0–17.0)
Immature Granulocytes: 5 %
Lymphocytes Relative: 9 %
Lymphs Abs: 0.6 10*3/uL — ABNORMAL LOW (ref 0.7–4.0)
MCH: 33.3 pg (ref 26.0–34.0)
MCHC: 33.3 g/dL (ref 30.0–36.0)
MCV: 100 fL (ref 80.0–100.0)
Monocytes Absolute: 1.1 10*3/uL — ABNORMAL HIGH (ref 0.1–1.0)
Monocytes Relative: 18 %
Neutro Abs: 4.3 10*3/uL (ref 1.7–7.7)
Neutrophils Relative %: 65 %
Platelet Count: 123 10*3/uL — ABNORMAL LOW (ref 150–400)
RBC: 2.73 MIL/uL — ABNORMAL LOW (ref 4.22–5.81)
RDW: 17 % — ABNORMAL HIGH (ref 11.5–15.5)
WBC Count: 6.5 10*3/uL (ref 4.0–10.5)
nRBC: 0 % (ref 0.0–0.2)

## 2021-02-24 LAB — URIC ACID: Uric Acid, Serum: 2 mg/dL — ABNORMAL LOW (ref 3.7–8.6)

## 2021-02-24 LAB — TYPE AND SCREEN
ABO/RH(D): O POS
Antibody Screen: NEGATIVE

## 2021-02-24 LAB — LACTATE DEHYDROGENASE: LDH: 217 U/L — ABNORMAL HIGH (ref 98–192)

## 2021-02-24 MED ORDER — HEPARIN SOD (PORK) LOCK FLUSH 100 UNIT/ML IV SOLN
500.0000 [IU] | Freq: Once | INTRAVENOUS | Status: AC
  Administered 2021-02-24: 500 [IU]
  Filled 2021-02-24: qty 5

## 2021-02-24 MED ORDER — SODIUM CHLORIDE 0.9% FLUSH
10.0000 mL | Freq: Once | INTRAVENOUS | Status: AC
  Administered 2021-02-24: 10 mL
  Filled 2021-02-24: qty 10

## 2021-02-24 NOTE — Progress Notes (Signed)
[facility] [phone]) [phone]   [phone]  PROGRESS NOTE  Patient Care Team: Gaynelle Arabian, MD as PCP - General (Family Medicine) Evans Lance, MD as PCP - Electrophysiology (Cardiology) [name], MD as PCP - Cardiology (Cardiology)  Hematological/Oncological History # High Grade B Cell Lymphoma, Stage III/IV 12/10/2020: CT Abdomen/Pelvis shows a soft tissue mass at 6.7 cm with soft tissue stranding.  12/14/2020: punch biopsy shows findings consistent with a high grade B cell lymphoma.  01/07/2021: establish care with Dr. Lorenso Courier  01/21/2021: PET CT scan shows diffuse disease with anterior abdominal wall lesion, pulmonary nodule, lesions along the cortex of the left/right kidney.  02/04/2021: Cycle 1 Day 1 of R-GCVP 02/25/2021: Cycle 2 Day 1 of R-GCVP  Interval History:  Rodney Lee 80 y.o. male with medical history significant for advanced stage high grade B cell lymphoma who presents for a follow up visit. The patient's last visit was on 02/04/2021. In the interim since the last visit he required blood transfusion and platelet infusion after he was found to have a hemoglobin of 6.5 and a platelet count of 8 on 02/18/2021.  On exam today Rodney Lee is accompanied by his wife.  He reports that the lesion on his abdomen has begun to respond to treatment.  He notes that it is decreasing in size and is less tender.  He also reports that the area on the back of his neck has near completely resolved.  He reports that he tolerated chemotherapy without any nausea or vomiting but was having some issues with diarrhea.  He also notes that he developed a period of severe weakness where he was having difficulty walking and had several falls when trying to go to the bathroom.  His wife tried to encourage him not to get up to go to the bathroom without her but he persisted.  He reports that his appetite is good and he otherwise has no questions concerns or complaints.  A full  10 point ROS is listed below.  MEDICAL HISTORY:  Past Medical History:  Diagnosis Date   Atrial fibrillation ([place])    CHADS2Vasc is at least 5, on Xarelto   CAD (coronary artery disease)    cardiomyopathy    MDT ICD, Dr. Lovena Le 2009   CHF (congestive heart failure) ([place])    Diabetes mellitus    Exogenous obesity    Hyperlipidemia    Hypertension    Hypothyroidism    MI, old 19   ANTEROLATERAL, PTCA LAD per pt   Sinus bradycardia     SURGICAL HISTORY: Past Surgical History:  Procedure Laterality Date   ANGIOPLASTY     CARDIAC CATHETERIZATION  03/13/1994   ACUTE MI WITH TOTAL OCCLUSION OF MID LAD. REPERFUSION AFTER CROSSING WITH A GUIDEWIRE. POOR RIGHT TO LEFT COLLATERAL FLOW. Pt states had PTCA   CARDIAC DEFIBRILLATOR PLACEMENT     CARDIOVASCULAR STRESS TEST  03/11/2008   EF 30%. NO REVERSIBLE ISCHEMIA   EP IMPLANTABLE DEVICE N/A 09/28/2015   Procedure:  ICD Generator Changeout;  Surgeon: Evans Lance, MD;  Location: [facility];  Service: Cardiovascular;  Laterality: N/A;   IR IMAGING GUIDED PORT INSERTION  01/28/2021   US ECHOCARDIOGRAPHY  04/26/2010   EF 35-40%. MODERATE LVH WITH MODERATE GLOBAL LV SYSTOLIC DYSFUNCTION AND IMPAIRED RELAXATION. MILD AORTIC SCLEROSIS WITHOUT STENOSIS. NORMAL PULMONARY ARTERY PRESSURE.   US ECHOCARDIOGRAPHY  10/07/2004   EF 30-35%    SOCIAL HISTORY: Social History   Socioeconomic History  Marital status: Married    Spouse name: Not on file   Number of children: Not on file   Years of education: Not on file   Highest education level: Not on file  Occupational History   Occupation: Retired  Tobacco Use   Smoking status: Never   Smokeless tobacco: Never  Vaping Use   Vaping Use: Never used  Substance and Sexual Activity   Alcohol use: No   Drug use: No   Sexual activity: Not on file  Other Topics Concern   Not on file  Social History Narrative   Lives with wife   Social Determinants of Health   Financial Resource  Strain: Not on file  Food Insecurity: Not on file  Transportation Needs: Not on file  Physical Activity: Not on file  Stress: Not on file  Social Connections: Not on file  Intimate Partner Violence: Not on file    FAMILY HISTORY: Family History  Problem Relation Age of Onset   Heart disease Mother    Heart attack Mother     ALLERGIES:  is allergic to canagliflozin and lipitor [atorvastatin calcium].  MEDICATIONS:  Current Outpatient Medications  Medication Sig Dispense Refill   allopurinol (ZYLOPRIM) 300 MG tablet Take 1 tablet (300 mg total) by mouth daily. 90 tablet 1   amiodarone (PACERONE) 200 MG tablet TAKE 1 TABLET BY MOUTH  DAILY 90 tablet 2   carvedilol (COREG) 6.25 MG tablet TAKE 1 TABLET BY MOUTH  TWICE DAILY WITH A MEAL 180 tablet 2   citalopram (CELEXA) 20 MG tablet 1 tablet     fexofenadine (ALLEGRA) 60 MG tablet 1 capsule     glimepiride (AMARYL) 1 MG tablet Take 1 mg by mouth daily as needed.     isosorbide mononitrate (IMDUR) 60 MG 24 hr tablet Take 60 mg in the morning and 30 mg (half a tablet) in the evening. 135 tablet 3   levothyroxine (SYNTHROID) 125 MCG tablet Take 125 mcg by mouth daily.     lidocaine-prilocaine (EMLA) cream Apply 1 application topically as needed. 30 g 0   Multiple Vitamin (MULTIVITAMIN) tablet Take 1 tablet by mouth daily.     nitroGLYCERIN (NITROSTAT) 0.4 MG SL tablet Place 1 tablet (0.4 mg total) under the tongue every 5 (five) minutes as needed for chest pain. 25 tablet 2   ondansetron (ZOFRAN) 8 MG tablet Take 1 tablet (8 mg total) by mouth every 8 (eight) hours as needed for nausea or vomiting. 30 tablet 0   ONETOUCH ULTRA test strip USE 1 STRIP TO CHECK GLUCOSE AS DIRECTED     predniSONE (DELTASONE) 20 MG tablet Take 3 tablets (60 mg total) by mouth as directed. Take 60 mg  ( 3 tablets) by mouth every morning on Days 1 to 5 of chemotherapy. 15 tablet 5   prochlorperazine (COMPAZINE) 10 MG tablet TAKE 1 TABLET BY MOUTH EVERY 6 HOURS AS  NEEDED FOR NAUSEA OR  VOMITING 30 tablet 0   repaglinide (PRANDIN) 1 MG tablet      rosuvastatin (CRESTOR) 40 MG tablet Take 40 mg by mouth daily.     vitamin C (ASCORBIC ACID) 500 MG tablet Take 500 mg by mouth daily.     No current facility-administered medications for this visit.    REVIEW OF SYSTEMS:   Constitutional: ( - ) fevers, ( - )  chills , ( - ) night sweats Eyes: ( - ) blurriness of vision, ( - ) double vision, ( - ) watery eyes  Ears, nose, mouth, throat, and face: ( - ) mucositis, ( - ) sore throat Respiratory: ( - ) cough, ( - ) dyspnea, ( - ) wheezes Cardiovascular: ( - ) palpitation, ( - ) chest discomfort, ( - ) lower extremity swelling Gastrointestinal:  ( - ) nausea, ( - ) heartburn, ( - ) change in bowel habits Skin: ( - ) abnormal skin rashes Lymphatics: ( - ) new lymphadenopathy, ( - ) easy bruising Neurological: ( - ) numbness, ( - ) tingling, ( - ) new weaknesses Behavioral/Psych: ( - ) mood change, ( - ) new changes  All other systems were reviewed with the patient and are negative.  PHYSICAL EXAMINATION: ECOG PERFORMANCE STATUS: 2 - Symptomatic, <50% confined to bed  Vitals:   02/24/21 1155  BP: (!) 92/51  Pulse: 69  Resp: 18  Temp: 97.7 F (36.5 C)  SpO2: 99%   Filed Weights   02/24/21 1155  Weight: 190 lb 12.8 oz (86.5 kg)    GENERAL: well appearing elderly Caucasian male. alert, no distress and comfortable SKIN: prior lesion on back on head resolved. Otherwise skin color, texture, turgor are normal, no rashes or significant lesions EYES: conjunctiva are pink and non-injected, sclera clear LUNGS: clear to auscultation and percussion with normal breathing effort HEART: regular rate & rhythm and no murmurs and no lower extremity edema ABDOMEN: Large left-sided lesion with some necrosis and drainage, but no overt signs of infection. Appears to be improving.  Consistent with lymphoma noted on PET CT scan. Musculoskeletal: no cyanosis of digits and  no clubbing  PSYCH: alert & oriented x 3, fluent speech NEURO: no focal motor/sensory deficits  LABORATORY DATA:  I have reviewed the data as listed CBC Latest Ref Rng & Units 02/24/2021 02/18/2021 02/11/2021  WBC 4.0 - 10.5 K/uL 6.5 2.9(L) 1.2(L)  Hemoglobin 13.0 - 17.0 g/dL 9.1(L) 6.5(LL) 9.6(L)  Hematocrit 39.0 - 52.0 % 27.3(L) 19.7(L) 28.8(L)  Platelets 150 - 400 K/uL 123(L) 8(LL) 37(L)    CMP Latest Ref Rng & Units 02/24/2021 02/18/2021 02/11/2021  Glucose 70 - 99 mg/dL 291(H) 193(H) 165(H)  BUN 8 - 23 mg/dL 21 47(H) 46(H)  Creatinine 0.61 - 1.24 mg/dL 2.36(H) 2.65(H) 2.13(H)  Sodium 135 - 145 mmol/L 133(L) 134(L) 139  Potassium 3.5 - 5.1 mmol/L 4.5 4.5 4.2  Chloride 98 - 111 mmol/L 99 101 101  CO2 22 - 32 mmol/L _0 Calcium 8.9 - 10.3 mg/dL 8.6(L) 8.2(L) 9.0  Total Protein 6.5 - 8.1 g/dL 5.9(L) 5.9(L) 6.3(L)  Total Bilirubin 0.3 - 1.2 mg/dL 0.4 0.5 1.4(H)  Alkaline Phos 38 - 126 U/L 89 57 57  AST 15 - 41 U/L _1 ALT 0 - 44 U/L 18 27 36    RADIOGRAPHIC STUDIES: I have personally reviewed the radiological images as listed and agreed with the findings in the report: involvement of the left abdomen, the kidney's bilaterally, and pulmonary nodule.   IR IMAGING GUIDED PORT INSERTION  Result Date: 01/28/2021 INDICATION: New diagnosis of lymphoma. In need of durable intravenous access for chemotherapy administration EXAM: IMPLANTED PORT A CATH PLACEMENT WITH ULTRASOUND AND FLUOROSCOPIC GUIDANCE COMPARISON:  None. MEDICATIONS: None ANESTHESIA/SEDATION: Moderate (conscious) sedation was employed during this procedure. A total of Versed 2 mg and Fentanyl 100 mcg was administered intravenously. Moderate Sedation Time: 23 minutes. The patient's level of consciousness and vital signs were monitored continuously by radiology nursing throughout the procedure under my direct supervision. CONTRAST:  None FLUOROSCOPY  TIME:  48 seconds (50 mGy) COMPLICATIONS: None immediate. PROCEDURE: The  procedure, risks, benefits, and alternatives were explained to the patient. Questions regarding the procedure were encouraged and answered. The patient understands and consents to the procedure. The right neck and chest were prepped with chlorhexidine in a sterile fashion, and a sterile drape was applied covering the operative field. Maximum barrier sterile technique with sterile gowns and gloves were used for the procedure. A timeout was performed prior to the initiation of the procedure. Local anesthesia was provided with 1% lidocaine with epinephrine. After creating a small venotomy incision, a micropuncture kit was utilized to access the internal jugular vein. Real-time ultrasound guidance was utilized for vascular access including the acquisition of a permanent ultrasound image documenting patency of the accessed vessel. The microwire was utilized to measure appropriate catheter length. A subcutaneous port pocket was then created along the upper chest wall utilizing a combination of sharp and blunt dissection. The pocket was irrigated with sterile saline. A single lumen "Slim" sized power injectable port was chosen for placement. The 8 Fr catheter was tunneled from the port pocket site to the venotomy incision. The port was placed in the pocket. The external catheter was trimmed to appropriate length. At the venotomy, an 8 Fr peel-away sheath was placed over a guidewire under fluoroscopic guidance. The catheter was then placed through the sheath and the sheath was removed. Final catheter positioning was confirmed and documented with a fluoroscopic spot radiograph. The port was accessed with a Huber needle, aspirated and flushed with heparinized saline. The venotomy site was closed with an interrupted 4-0 Vicryl suture. The port pocket incision was closed with interrupted 2-0 Vicryl suture. Dermabond and Steri-strips were applied to both incisions. Dressings were applied. The patient tolerated the procedure well  without immediate post procedural complication. FINDINGS: After catheter placement, the tip lies within the superior cavoatrial junction. The catheter aspirates and flushes normally and is ready for immediate use. IMPRESSION: Successful placement of a right internal jugular approach power injectable Port-A-Cath. The catheter is ready for immediate use. Electronically Signed   By: Sandi Mariscal M.D.   On: 01/28/2021 13:54     ASSESSMENT & PLAN Rodney Lee 80 y.o. male with medical history significant for advanced stage high grade B cell lymphoma who presents for a follow up visit.   Given Rodney Lee's complicated medical history with cardiac dysfunction, renal dysfunction, and advanced age I am concerned that he would not be able to tolerate anthracycline-based therapy such as R mini CHOP.  As such the best regimen to proceed with given his comorbidities would be R-GCVP which is not toxic to the heart or filter to the kidney and can be used in patients with advanced age.  At this time we recommend proceeding with R-GCVP chemotherapy given his advanced age, cardiac dysfunction, and poor renal function.  Previously discussed the risks and benefits of treatment including nausea, vomiting, diarrhea, neuropathy, and allergic reactions.  Patient has an IPI showing he has high risk with lower rates of success with these treatment regimens.  Patient voices understanding of the risks and benefits of treatment was willing to proceed.  We will plan for 21 day cycles x 6 cycles. Rituximab 320m/m2 IV on Day 1, Cyclophosphamide 750 mg /m2 on Day 1, Vincristine 1.458mm2 (capped at 22m29mon Day 1, and Prednisone 35m54my 1-5 (protocol calls for prednisolone, but will adjust to lower dose prednisone). The Gemcitabine is to be administered on Day 1-8 and  will be 750 mg/m2 IV over 30 minutes once per day on days 1 & 8 for Cycle 1, 875 mg/m2 IV over 30 minutes once per day on days 1 & 8 for Cycle 2, and 1000 mg/m2 IV over 30  minutes once per day on days 1 & 8 for Cycle 3 and onward.   # High Grade B Cell Lymphoma, Stage III/IV --PET CT scan findings consistent with at least Stage III disease, with organ involvement most likely a Stage IV --avoiding anthracycline therapy and alternative regimens given his poor cardiac and renal function -- 02/04/2021 was Cycle 1 Day 1 of R-GCVP --tomorrow is Cycle 2 Day 1 of R-GCVP --patient will RTC next week for Day 8 of treatment. Will assure weekly labs between now and next Cycle. Next clinic visit in 3 weeks time.   #Chemotherapy Induced Anemia #Chemotherapy Induced Thrombocytopenia --Hgb 9.1 today, improved from 6.5 on 02/18/2021 --Plt 123 today, up from 8 on 02/18/2021.  --continue to monitor with transfusion support as needed.   #Supportive Care -- chemotherapy education complete -- port placed -- zofran 49m q8H PRN and compazine 121mPO q6H for nausea -- allopurinol 30075mO daily for TLS prophylaxis -- EMLA cream for port -- no pain medication required at this time.   No orders of the defined types were placed in this encounter.  All questions were answered. The patient knows to call the clinic with any problems, questions or concerns.  A total of more than 30 minutes were spent on this encounter with face-to-face time and non-face-to-face time, including preparing to see the patient, ordering tests and/or medications, counseling the patient and coordination of care as outlined above.   JohLedell PeoplesD Department of Hematology/Oncology [facility]: [phone]: [phone]: [email or web address]  02/24/2021 2:13 PM

## 2021-02-24 NOTE — Progress Notes (Signed)
Per Dr. Lorenso Courier, okay to treat with elevated SCr.

## 2021-02-24 NOTE — Patient Instructions (Signed)

## 2021-02-25 ENCOUNTER — Inpatient Hospital Stay: Payer: Medicare Other

## 2021-02-25 ENCOUNTER — Other Ambulatory Visit: Payer: Self-pay

## 2021-02-25 VITALS — BP 116/60 | HR 60 | Temp 97.7°F | Resp 18

## 2021-02-25 DIAGNOSIS — C8510 Unspecified B-cell lymphoma, unspecified site: Secondary | ICD-10-CM

## 2021-02-25 DIAGNOSIS — D6481 Anemia due to antineoplastic chemotherapy: Secondary | ICD-10-CM | POA: Diagnosis not present

## 2021-02-25 DIAGNOSIS — Z5189 Encounter for other specified aftercare: Secondary | ICD-10-CM | POA: Diagnosis not present

## 2021-02-25 DIAGNOSIS — E1165 Type 2 diabetes mellitus with hyperglycemia: Secondary | ICD-10-CM | POA: Diagnosis not present

## 2021-02-25 DIAGNOSIS — Z5111 Encounter for antineoplastic chemotherapy: Secondary | ICD-10-CM | POA: Diagnosis not present

## 2021-02-25 MED ORDER — SODIUM CHLORIDE 0.9% FLUSH
10.0000 mL | INTRAVENOUS | Status: DC | PRN
  Administered 2021-02-25: 10 mL
  Filled 2021-02-25: qty 10

## 2021-02-25 MED ORDER — PALONOSETRON HCL INJECTION 0.25 MG/5ML
INTRAVENOUS | Status: AC
  Filled 2021-02-25: qty 5

## 2021-02-25 MED ORDER — SODIUM CHLORIDE 0.9 % IV SOLN
560.0000 mg/m2 | Freq: Once | INTRAVENOUS | Status: AC
  Administered 2021-02-25: 1180 mg via INTRAVENOUS
  Filled 2021-02-25: qty 59

## 2021-02-25 MED ORDER — ACETAMINOPHEN 325 MG PO TABS
650.0000 mg | ORAL_TABLET | Freq: Once | ORAL | Status: AC
Start: 2021-02-25 — End: 2021-02-25
  Administered 2021-02-25: 650 mg via ORAL

## 2021-02-25 MED ORDER — SODIUM CHLORIDE 0.9 % IV SOLN
375.0000 mg/m2 | Freq: Once | INTRAVENOUS | Status: AC
  Administered 2021-02-25: 800 mg via INTRAVENOUS
  Filled 2021-02-25: qty 50

## 2021-02-25 MED ORDER — PALONOSETRON HCL INJECTION 0.25 MG/5ML
0.2500 mg | Freq: Once | INTRAVENOUS | Status: AC
Start: 2021-02-25 — End: 2021-02-25
  Administered 2021-02-25: 0.25 mg via INTRAVENOUS

## 2021-02-25 MED ORDER — VINCRISTINE SULFATE CHEMO INJECTION 1 MG/ML
2.0000 mg | Freq: Once | INTRAVENOUS | Status: AC
  Administered 2021-02-25: 2 mg via INTRAVENOUS
  Filled 2021-02-25: qty 2

## 2021-02-25 MED ORDER — DIPHENHYDRAMINE HCL 25 MG PO CAPS
ORAL_CAPSULE | ORAL | Status: AC
  Filled 2021-02-25: qty 2

## 2021-02-25 MED ORDER — HEPARIN SOD (PORK) LOCK FLUSH 100 UNIT/ML IV SOLN
500.0000 [IU] | Freq: Once | INTRAVENOUS | Status: AC | PRN
  Administered 2021-02-25: 500 [IU]
  Filled 2021-02-25: qty 5

## 2021-02-25 MED ORDER — SODIUM CHLORIDE 0.9 % IV SOLN
Freq: Once | INTRAVENOUS | Status: AC
  Filled 2021-02-25: qty 250

## 2021-02-25 MED ORDER — DIPHENHYDRAMINE HCL 25 MG PO CAPS
50.0000 mg | ORAL_CAPSULE | Freq: Once | ORAL | Status: AC
Start: 2021-02-25 — End: 2021-02-25
  Administered 2021-02-25: 50 mg via ORAL

## 2021-02-25 MED ORDER — SODIUM CHLORIDE 0.9 % IV SOLN
750.0000 mg/m2 | Freq: Once | INTRAVENOUS | Status: AC
  Administered 2021-02-25: 1558 mg via INTRAVENOUS
  Filled 2021-02-25: qty 40.98

## 2021-02-25 MED ORDER — DEXAMETHASONE SODIUM PHOSPHATE 100 MG/10ML IJ SOLN
10.0000 mg | Freq: Once | INTRAMUSCULAR | Status: AC
  Administered 2021-02-25: 10 mg via INTRAVENOUS
  Filled 2021-02-25: qty 10

## 2021-02-25 MED ORDER — ACETAMINOPHEN 325 MG PO TABS
ORAL_TABLET | ORAL | Status: AC
  Filled 2021-02-25: qty 2

## 2021-02-25 NOTE — Progress Notes (Signed)
Ok to treat w/ elevated creatinine per Dr. Lorenso Courier

## 2021-02-25 NOTE — Patient Instructions (Signed)
[facility]    Discharge Instructions: Thank you for choosing [facility] to provide your oncology and hematology care.   If you have a lab appointment with the [facility], please go directly to the [facility] and check in at the registration area.   Wear comfortable clothing and clothing appropriate for easy access to any Portacath or PICC line.   We strive to give you quality time with your provider. You may need to reschedule your appointment if you arrive late (15 or more minutes).  Arriving late affects you and other patients whose appointments are after yours.  Also, if you miss three or more appointments without notifying the office, you may be dismissed from the clinic at the provider's discretion.      For prescription refill requests, have your pharmacy contact our office and allow 72 hours for refills to be completed.    Today you received the following chemotherapy and/or immunotherapy agents: rituximab, vincristine, gemcitabine, and cyclophosphamide.     To help prevent nausea and vomiting after your treatment, we encourage you to take your nausea medication as directed.  BELOW ARE SYMPTOMS THAT SHOULD BE REPORTED IMMEDIATELY: *FEVER GREATER THAN 100.4 F (38 C) OR HIGHER *CHILLS OR SWEATING *NAUSEA AND VOMITING THAT IS NOT CONTROLLED WITH YOUR NAUSEA MEDICATION *UNUSUAL SHORTNESS OF BREATH *UNUSUAL BRUISING OR BLEEDING *URINARY PROBLEMS (pain or burning when urinating, or frequent urination) *BOWEL PROBLEMS (unusual diarrhea, constipation, pain near the anus) TENDERNESS IN MOUTH AND THROAT WITH OR WITHOUT PRESENCE OF ULCERS (sore throat, sores in mouth, or a toothache) UNUSUAL RASH, SWELLING OR PAIN  UNUSUAL VAGINAL DISCHARGE OR ITCHING   Items with * indicate a potential emergency and should be followed up as soon as possible or go to the Emergency Department if any problems should occur.  Please show the CHEMOTHERAPY  ALERT CARD or IMMUNOTHERAPY ALERT CARD at check-in to the Emergency Department and triage nurse.  Should you have questions after your visit or need to cancel or reschedule your appointment, please contact [facility]  Dept: [phone]  and follow the prompts.  Office hours are 8:00 a.m. to 4:30 p.m. Monday - Friday. Please note that voicemails left after 4:00 p.m. may not be returned until the following business day.  We are closed weekends and major holidays. You have access to a nurse at all times for urgent questions. Please call the main number to the clinic Dept: [phone] and follow the prompts.   For any non-urgent questions, you may also contact your provider using MyChart. We now offer e-Visits for anyone 83 and older to request care online for non-urgent symptoms. For details visit [email or web address].   Also download the MyChart app! Go to the app store, search "MyChart", open the app, select [facility], and log in with your MyChart username and password.  Due to Covid, a mask is required upon entering the hospital/clinic. If you do not have a mask, one will be given to you upon arrival. For doctor visits, patients may have 1 support person aged 11 or older with them. For treatment visits, patients cannot have anyone with them due to current Covid guidelines and our immunocompromised population.

## 2021-02-25 NOTE — Progress Notes (Signed)
Dr. Lorenso Courier would like to keep Gemzar dosed at 750 mg/m2 due to pt tolerance.  Kennith Center, Pharm.D., CPP 02/25/2021@8 :25 AM

## 2021-03-01 ENCOUNTER — Telehealth: Payer: Self-pay | Admitting: Hematology and Oncology

## 2021-03-01 NOTE — Telephone Encounter (Signed)
Scheduled per los. Called and spoke with patient. Confirmed appt 

## 2021-03-02 ENCOUNTER — Other Ambulatory Visit: Payer: Self-pay

## 2021-03-02 ENCOUNTER — Encounter (HOSPITAL_BASED_OUTPATIENT_CLINIC_OR_DEPARTMENT_OTHER): Payer: Medicare Other | Admitting: Internal Medicine

## 2021-03-02 DIAGNOSIS — I429 Cardiomyopathy, unspecified: Secondary | ICD-10-CM | POA: Diagnosis not present

## 2021-03-02 DIAGNOSIS — E11622 Type 2 diabetes mellitus with other skin ulcer: Secondary | ICD-10-CM | POA: Diagnosis not present

## 2021-03-02 DIAGNOSIS — I509 Heart failure, unspecified: Secondary | ICD-10-CM | POA: Diagnosis not present

## 2021-03-02 DIAGNOSIS — C8510 Unspecified B-cell lymphoma, unspecified site: Secondary | ICD-10-CM | POA: Diagnosis not present

## 2021-03-02 DIAGNOSIS — L89152 Pressure ulcer of sacral region, stage 2: Secondary | ICD-10-CM | POA: Diagnosis not present

## 2021-03-02 NOTE — Progress Notes (Addendum)
Rodney Lee ([id number]) , Visit Report for 03/02/2021 Rodney Lee: Date of Service: [name], Lee 03/02/2021 12:30 PM Medical Record Number: [id number] Patient Account Number: [id number] Date of Birth/Sex: Treating RN: [birth year] (80 y.o. [name], [name] Primary Care Provider: Simona Huh Other Clinician: Referring Provider: Treating Provider/Extender: Joseph Berkshire in Treatment: 1 Rodney Performed for Assessment: Wound #1 Sacrum Performed By: Physician Ricard Dillon., MD Rodney Type: Rodney Level of Consciousness (Pre-procedure): Awake and Alert Pre-procedure Verification/Time Out Yes - 13:30 Taken: Start Time: 13:30 Pain Control: Lidocaine T Area Debrided (L x W): otal 0.1 (cm) x 0.1 (cm) = 0.01 (cm) Tissue and other material debrided: Viable, Non-Viable, Eschar, Skin: Dermis , Skin: Epidermis Level: Skin/Epidermis Rodney Description: Selective/Open Wound Instrument: Curette Bleeding: Minimum Hemostasis Achieved: Pressure End Time: 13:31 Procedural Pain: 0 Post Procedural Pain: 0 Response to Treatment: Procedure was tolerated well Level of Consciousness (Post- Awake and Alert procedure): Post Rodney Measurements of Total Wound Length: (cm) 0.1 Stage: Category/Stage II Width: (cm) 0.1 Depth: (cm) 0.1 Volume: (cm) 0.001 Character of Wound/Ulcer Post Rodney: Improved Post Procedure Diagnosis Same as Pre-procedure Electronic Signature(s) Signed: 03/02/2021 5:29:17 PM By: Linton Ham MD Signed: 03/02/2021 5:48:53 PM By: Rhae Hammock RN Entered By: Linton Ham on 03/02/2021 13:44:03 -------------------------------------------------------------------------------- HPI Details Patient Lee: Date of Service: Rodney Lee. 03/02/2021 12:30 PM Medical Record Number: [id number] Patient Account Number: [id number] Date of Birth/Sex: Treating RN: 07/22/1941 (79 y.o.  Rodney Lee Primary Care Provider: Simona Huh Other Clinician: Referring Provider: Treating Provider/Extender: Joseph Berkshire in Treatment: 1 History of Present Illness HPI Description: ADMISSION 02/23/2021 This is a pleasant 80 year old man who arrived with his wife. He has been diagnosed this year with high-grade B-cell lymphoma stage III/IV. CT scan of the abdomen pelvis showed a soft tissue mass at 6.7 cm. Punch biopsy was consistent with a high-grade B-cell lymphoma. He is starting chemotherapy. His oncologist is Dr. Lorenso Courier. PET scan showed diffuse disease along the anterior abdominal wall pulmonary nodule lesions along the cortex of the left and right kidney. He is being treated with RGCVP. The patient feels generally weak. He spends most of his day in a recliner. His next chemotherapy is this Thursday and he will be back in a recliner. Noted by home health to have a wound on the lower sacrum/coccyx just to the right of the midline. They have been applying Vaseline or Vaseline gauze I did look at his wound on his abdomen which simply had an ABD pad over it. This is a malignant wound by imaging and biopsy. A large necrotic area. Seems to have minimal drainage however. No evidence of infection around this. Past medical history includes B-cell lymphoma as described, cardiomyopathy, CHF, history of V. tach 6/28 small area on the right buttock in close proximity to the sacrum. I did not look at his abdominal wound today. Electronic Signature(s) Signed: 03/02/2021 5:29:17 PM By: Linton Ham MD Entered By: Linton Ham on 03/02/2021 13:44:32 -------------------------------------------------------------------------------- Physical Exam Details Patient Lee: Date of Service: Rodney Lee 03/02/2021 12:30 PM Medical Record Number: [id number] Patient Account Number: [id number] Date of Birth/Sex: Treating RN: 1940-11-17 (79 y.o. Rodney Lee Primary Care Provider: Simona Huh Other Clinician: Referring Provider: Treating Provider/Extender: Joseph Berkshire in Treatment: 1 Constitutional Sitting or standing Blood Pressure is within target range for patient.. Pulse regular and within target range for patient.Marland Kitchen Respirations regular,  non-labored and within target range.. Temperature is normal and within the target range for the patient.Marland Kitchen Appears in no distress. Notes Wound exam; the area in question is at the level of the coccyx just the right of the midline. It is within the gluteal cleft. Again the same debris over the surface I removed with a #3 curette.'s tissue underneath looked quite good but I did the same thing last time. Electronic Signature(s) Signed: 03/02/2021 5:29:17 PM By: Linton Ham MD Entered By: Linton Ham on 03/02/2021 13:45:34 -------------------------------------------------------------------------------- Physician Orders Details Patient Lee: Date of Service: Rodney Lee 03/02/2021 12:30 PM Medical Record Number: [id number] Patient Account Number: [id number] Date of Birth/Sex: Treating RN: 1941/07/28 (79 y.o. Rodney Lee Primary Care Provider: Simona Huh Other Clinician: Referring Provider: Treating Provider/Extender: Joseph Berkshire in Treatment: 1 Verbal / Phone Orders: No Diagnosis Coding Follow-up Appointments ppointment in 1 week. - Dr. Dellia Nims Return A Bathing/ Shower/ Hygiene May shower with protection but do not get wound dressing(s) wet. Off-Loading Roho cushion for wheelchair - or for any chair when sitting Turn and reposition every 2 hours Wound Treatment Wound #1 - Sacrum Cleanser: Soap and Water 1 x Per Day/15 Days Discharge Instructions: May shower and wash wound with dial antibacterial soap and water prior to dressing change. Cleanser: Wound Cleanser (Generic) 1 x Per Day/15 Days Discharge  Instructions: Cleanse the wound with wound cleanser prior to applying a clean dressing using gauze sponges, not tissue or cotton balls. Peri-Wound Care: Skin Prep (Generic) 1 x Per Day/15 Days Discharge Instructions: Use skin prep as directed Prim Dressing: KerraCel Ag Gelling Fiber Dressing, 4x5 in (silver alginate) (Generic) 1 x Per Day/15 Days ary Discharge Instructions: Apply silver alginate to wound bed as instructed Secondary Dressing: Bordered Gauze, 4x4 in (DME) (Generic) 1 x Per Day/15 Days Discharge Instructions: Apply over primary dressing as directed. Electronic Signature(s) Signed: 03/02/2021 5:29:17 PM By: Linton Ham MD Signed: 03/02/2021 5:48:53 PM By: Rhae Hammock RN Entered By: Rhae Hammock on 03/02/2021 13:30:12 -------------------------------------------------------------------------------- Problem List Details Patient Lee: Date of Service: KALLEN, MCCRYSTAL 03/02/2021 12:30 PM Medical Record Number: [id number] Patient Account Number: [id number] Date of Birth/Sex: Treating RN: November 13, 1940 (79 y.o. [name], [name] Primary Care Provider: Simona Huh Other Clinician: Referring Provider: Treating Provider/Extender: Joseph Berkshire in Treatment: 1 Active Problems ICD-10 Encounter Code Description Active Date MDM Diagnosis L89.152 Pressure ulcer of sacral region, stage 2 02/23/2021 No Yes C85.10 Unspecified B-cell lymphoma, unspecified site 02/23/2021 No Yes Inactive Problems Resolved Problems Electronic Signature(s) Signed: 03/02/2021 5:29:17 PM By: Linton Ham MD Entered By: Linton Ham on 03/02/2021 13:43:41 -------------------------------------------------------------------------------- Progress Note Details Patient Lee: Date of Service: Rodney Lee. 03/02/2021 12:30 PM Medical Record Number: [id number] Patient Account Number: [id number] Date of Birth/Sex: Treating RN: [birth year] (79 y.o. Rodney Lee Primary Care Provider: Simona Huh Other Clinician: Referring Provider: Treating Provider/Extender: Joseph Berkshire in Treatment: 1 Subjective History of Present Illness (HPI) ADMISSION 02/23/2021 This is a pleasant 80 year old man who arrived with his wife. He has been diagnosed this year with high-grade B-cell lymphoma stage III/IV. CT scan of the abdomen pelvis showed a soft tissue mass at 6.7 cm. Punch biopsy was consistent with a high-grade B-cell lymphoma. He is starting chemotherapy. His oncologist is Dr. Lorenso Courier. PET scan showed diffuse disease along the anterior abdominal wall pulmonary nodule lesions along the cortex of the left and right kidney. He  is being treated with RooGCVP. The patient feels generally weak. He spends most of his day in a recliner. His next chemotherapy is this Thursday and he will be back in a recliner. Noted by home health to have a wound on the lower sacrum/coccyx just to the right of the midline. They have been applying Vaseline or Vaseline gauze I did look at his wound on his abdomen which simply had an ABD pad over it. This is a malignant wound by imaging and biopsy. A large necrotic area. Seems to have minimal drainage however. No evidence of infection around this. Past medical history includes B-cell lymphoma as described, cardiomyopathy, CHF, history of V. tach 6/28 small area on the right buttock in close proximity to the sacrum. I did not look at his abdominal wound today. Objective Constitutional Sitting or standing Blood Pressure is within target range for patient.. Pulse regular and within target range for patient.Marland Kitchen Respirations regular, non-labored and within target range.. Temperature is normal and within the target range for the patient.Marland Kitchen Appears in no distress. Vitals Time Taken: 1:01 PM, Height: 69 in, Weight: 196 lbs, BMI: 28.9, Temperature: 98.3 F, Pulse: 67 bpm, Respiratory Rate: 18 breaths/min, Blood  Pressure: 112/69 mmHg. General Notes: Wound exam; the area in question is at the level of the coccyx just the right of the midline. It is within the gluteal cleft. Again the same debris over the surface I removed with a #3 curette.'s tissue underneath looked quite good but I did the same thing last time. Integumentary (Hair, Skin) Wound #1 status is Open. Original cause of wound was Pressure Injury. The date acquired was: 02/15/2021. The wound has been in treatment 1 weeks. The wound is located on the Sacrum. The wound measures 0.1cm length x 0.1cm width x 0.1cm depth; 0.008cm^2 area and 0.001cm^3 volume. There is no tunneling or undermining noted. There is a none present amount of drainage noted. The wound margin is distinct with the outline attached to the wound base. There is no granulation within the wound bed. There is no necrotic tissue within the wound bed. General Notes: covered in a dry film o healed versus covered in adherent dry tissue Assessment Active Problems ICD-10 Pressure ulcer of sacral region, stage 2 Unspecified B-cell lymphoma, unspecified site Procedures Wound #1 Pre-procedure diagnosis of Wound #1 is a Pressure Ulcer located on the Sacrum . There was a Selective/Open Wound Skin/Epidermis Rodney with a total area of 0.01 sq cm performed by Ricard Dillon., MD. With the following instrument(s): Curette to remove Viable and Non-Viable tissue/material. Material removed includes Eschar, Skin: Dermis, and Skin: Epidermis after achieving pain control using Lidocaine. No specimens were taken. A time out was conducted at 13:30, prior to the start of the procedure. A Minimum amount of bleeding was controlled with Pressure. The procedure was tolerated well with a pain level of 0 throughout and a pain level of 0 following the procedure. Post Rodney Measurements: 0.1cm length x 0.1cm width x 0.1cm depth; 0.001cm^3 volume. Post Rodney Stage noted as Category/Stage  II. Character of Wound/Ulcer Post Rodney is improved. Post procedure Diagnosis Wound #1: Same as Pre-Procedure Plan Follow-up Appointments: Return Appointment in 1 week. - Dr. Dellia Nims Bathing/ Shower/ Hygiene: May shower with protection but do not get wound dressing(s) wet. Off-Loading: Roho cushion for wheelchair - or for any chair when sitting Turn and reposition every 2 hours WOUND #1: - Sacrum Wound Laterality: Cleanser: Soap and Water 1 x Per Day/15 Days Discharge Instructions: May shower  and wash wound with dial antibacterial soap and water prior to dressing change. Cleanser: Wound Cleanser (Generic) 1 x Per Day/15 Days Discharge Instructions: Cleanse the wound with wound cleanser prior to applying a clean dressing using gauze sponges, not tissue or cotton balls. Peri-Wound Care: Skin Prep (Generic) 1 x Per Day/15 Days Discharge Instructions: Use skin prep as directed Prim Dressing: KerraCel Ag Gelling Fiber Dressing, 4x5 in (silver alginate) (Generic) 1 x Per Day/15 Days ary Discharge Instructions: Apply silver alginate to wound bed as instructed Secondary Dressing: Bordered Gauze, 4x4 in (DME) (Generic) 1 x Per Day/15 Days Discharge Instructions: Apply over primary dressing as directed. 1. Still using silver alginate although I may need to change next week if this is not closed Electronic Signature(s) Signed: 03/02/2021 5:29:17 PM By: Linton Ham MD Entered By: Linton Ham on 03/02/2021 13:45:57 -------------------------------------------------------------------------------- SuperBill Details Patient Lee: Date of Service: Rodney Lee 03/02/2021 Medical Record Number: [id number] Patient Account Number: [id number] Date of Birth/Sex: Treating RN: 07/04/1941 (79 y.o. Rodney Lee Primary Care Provider: Simona Huh Other Clinician: Referring Provider: Treating Provider/Extender: Joseph Berkshire in Treatment: 1 Diagnosis  Coding ICD-10 Codes Code Description [phone] Pressure ulcer of sacral region, stage 2 C85.10 Unspecified B-cell lymphoma, unspecified site Facility Procedures CPT4 Code: [id number] Description: [phone] - DEBRIDE WOUND 1ST 20 SQ CM OR < ICD-10 Diagnosis Description L89.152 Pressure ulcer of sacral region, stage 2 Modifier: Quantity: 1 Physician Procedures : CPT4 Code Description Modifier [id number] - WC PHYS DEBR WO ANESTH 20 SQ CM ICD-10 Diagnosis Description L89.152 Pressure ulcer of sacral region, stage 2 Quantity: 1 Electronic Signature(s) Signed: 03/02/2021 5:29:17 PM By: Linton Ham MD Entered By: Linton Ham on 03/02/2021 13:46:15

## 2021-03-03 ENCOUNTER — Inpatient Hospital Stay: Payer: Medicare Other

## 2021-03-03 ENCOUNTER — Other Ambulatory Visit: Payer: Self-pay

## 2021-03-03 ENCOUNTER — Encounter (HOSPITAL_COMMUNITY): Payer: Self-pay | Admitting: Emergency Medicine

## 2021-03-03 ENCOUNTER — Observation Stay (HOSPITAL_COMMUNITY)
Admission: EM | Admit: 2021-03-03 | Discharge: 2021-03-04 | Disposition: A | Discharge status: Home / Self Care | Payer: Medicare Other | Attending: Internal Medicine | Admitting: Internal Medicine

## 2021-03-03 VITALS — BP 77/44 | HR 59 | Temp 97.7°F | Resp 16

## 2021-03-03 VITALS — BP 86/42 | HR 59 | Resp 16

## 2021-03-03 DIAGNOSIS — E1122 Type 2 diabetes mellitus with diabetic chronic kidney disease: Secondary | ICD-10-CM | POA: Diagnosis not present

## 2021-03-03 DIAGNOSIS — N1832 Chronic kidney disease, stage 3b: Secondary | ICD-10-CM | POA: Diagnosis not present

## 2021-03-03 DIAGNOSIS — D6481 Anemia due to antineoplastic chemotherapy: Secondary | ICD-10-CM | POA: Diagnosis not present

## 2021-03-03 DIAGNOSIS — E860 Dehydration: Secondary | ICD-10-CM

## 2021-03-03 DIAGNOSIS — Z5189 Encounter for other specified aftercare: Secondary | ICD-10-CM | POA: Diagnosis not present

## 2021-03-03 DIAGNOSIS — I2510 Atherosclerotic heart disease of native coronary artery without angina pectoris: Secondary | ICD-10-CM | POA: Insufficient documentation

## 2021-03-03 DIAGNOSIS — Z20822 Contact with and (suspected) exposure to covid-19: Secondary | ICD-10-CM | POA: Diagnosis not present

## 2021-03-03 DIAGNOSIS — I9589 Other hypotension: Secondary | ICD-10-CM

## 2021-03-03 DIAGNOSIS — D61818 Other pancytopenia: Secondary | ICD-10-CM | POA: Diagnosis not present

## 2021-03-03 DIAGNOSIS — E861 Hypovolemia: Secondary | ICD-10-CM | POA: Diagnosis not present

## 2021-03-03 DIAGNOSIS — I130 Hypertensive heart and chronic kidney disease with heart failure and stage 1 through stage 4 chronic kidney disease, or unspecified chronic kidney disease: Secondary | ICD-10-CM | POA: Insufficient documentation

## 2021-03-03 DIAGNOSIS — C8510 Unspecified B-cell lymphoma, unspecified site: Secondary | ICD-10-CM | POA: Diagnosis not present

## 2021-03-03 DIAGNOSIS — E785 Hyperlipidemia, unspecified: Secondary | ICD-10-CM | POA: Diagnosis present

## 2021-03-03 DIAGNOSIS — I5022 Chronic systolic (congestive) heart failure: Secondary | ICD-10-CM | POA: Insufficient documentation

## 2021-03-03 DIAGNOSIS — I480 Paroxysmal atrial fibrillation: Secondary | ICD-10-CM | POA: Insufficient documentation

## 2021-03-03 DIAGNOSIS — I959 Hypotension, unspecified: Principal | ICD-10-CM | POA: Insufficient documentation

## 2021-03-03 DIAGNOSIS — E1165 Type 2 diabetes mellitus with hyperglycemia: Secondary | ICD-10-CM | POA: Diagnosis not present

## 2021-03-03 DIAGNOSIS — E039 Hypothyroidism, unspecified: Secondary | ICD-10-CM | POA: Diagnosis not present

## 2021-03-03 DIAGNOSIS — Z79899 Other long term (current) drug therapy: Secondary | ICD-10-CM | POA: Insufficient documentation

## 2021-03-03 DIAGNOSIS — Z5111 Encounter for antineoplastic chemotherapy: Secondary | ICD-10-CM | POA: Diagnosis not present

## 2021-03-03 DIAGNOSIS — Z7984 Long term (current) use of oral hypoglycemic drugs: Secondary | ICD-10-CM | POA: Insufficient documentation

## 2021-03-03 DIAGNOSIS — R739 Hyperglycemia, unspecified: Secondary | ICD-10-CM

## 2021-03-03 DIAGNOSIS — C9000 Multiple myeloma not having achieved remission: Secondary | ICD-10-CM

## 2021-03-03 DIAGNOSIS — Z95810 Presence of automatic (implantable) cardiac defibrillator: Secondary | ICD-10-CM | POA: Insufficient documentation

## 2021-03-03 DIAGNOSIS — N183 Chronic kidney disease, stage 3 unspecified: Secondary | ICD-10-CM | POA: Diagnosis not present

## 2021-03-03 DIAGNOSIS — Z95828 Presence of other vascular implants and grafts: Secondary | ICD-10-CM

## 2021-03-03 DIAGNOSIS — L8990 Pressure ulcer of unspecified site, unspecified stage: Secondary | ICD-10-CM | POA: Insufficient documentation

## 2021-03-03 DIAGNOSIS — R5383 Other fatigue: Secondary | ICD-10-CM | POA: Diagnosis present

## 2021-03-03 LAB — CBC WITH DIFFERENTIAL (CANCER CENTER ONLY)
Abs Immature Granulocytes: 0.12 10*3/uL — ABNORMAL HIGH (ref 0.00–0.07)
Basophils Absolute: 0 10*3/uL (ref 0.0–0.1)
Basophils Relative: 1 %
Eosinophils Absolute: 0 10*3/uL (ref 0.0–0.5)
Eosinophils Relative: 0 %
HCT: 27.3 % — ABNORMAL LOW (ref 39.0–52.0)
Hemoglobin: 9.2 g/dL — ABNORMAL LOW (ref 13.0–17.0)
Immature Granulocytes: 9 %
Lymphocytes Relative: 29 %
Lymphs Abs: 0.4 10*3/uL — ABNORMAL LOW (ref 0.7–4.0)
MCH: 33.3 pg (ref 26.0–34.0)
MCHC: 33.7 g/dL (ref 30.0–36.0)
MCV: 98.9 fL (ref 80.0–100.0)
Monocytes Absolute: 0 10*3/uL — ABNORMAL LOW (ref 0.1–1.0)
Monocytes Relative: 1 %
Neutro Abs: 0.9 10*3/uL — ABNORMAL LOW (ref 1.7–7.7)
Neutrophils Relative %: 60 %
Platelet Count: 132 10*3/uL — ABNORMAL LOW (ref 150–400)
RBC: 2.76 MIL/uL — ABNORMAL LOW (ref 4.22–5.81)
RDW: 16.5 % — ABNORMAL HIGH (ref 11.5–15.5)
WBC Count: 1.4 10*3/uL — ABNORMAL LOW (ref 4.0–10.5)
nRBC: 0 % (ref 0.0–0.2)

## 2021-03-03 LAB — BASIC METABOLIC PANEL
Anion gap: 7 (ref 5–15)
BUN: 42 mg/dL — ABNORMAL HIGH (ref 8–23)
CO2: 29 mmol/L (ref 22–32)
Calcium: 8.4 mg/dL — ABNORMAL LOW (ref 8.9–10.3)
Chloride: 94 mmol/L — ABNORMAL LOW (ref 98–111)
Creatinine, Ser: 1.81 mg/dL — ABNORMAL HIGH (ref 0.61–1.24)
GFR, Estimated: 37 mL/min — ABNORMAL LOW (ref >60)
Glucose, Bld: 356 mg/dL — ABNORMAL HIGH (ref 70–99)
Potassium: 3.6 mmol/L (ref 3.5–5.1)
Sodium: 130 mmol/L — ABNORMAL LOW (ref 135–145)

## 2021-03-03 LAB — CMP (CANCER CENTER ONLY)
ALT: 77 U/L — ABNORMAL HIGH (ref 0–44)
AST: 32 U/L (ref 15–41)
Albumin: 3 g/dL — ABNORMAL LOW (ref 3.5–5.0)
Alkaline Phosphatase: 145 U/L — ABNORMAL HIGH (ref 38–126)
Anion gap: 10 (ref 5–15)
BUN: 41 mg/dL — ABNORMAL HIGH (ref 8–23)
CO2: 29 mmol/L (ref 22–32)
Calcium: 8.8 mg/dL — ABNORMAL LOW (ref 8.9–10.3)
Chloride: 88 mmol/L — ABNORMAL LOW (ref 98–111)
Creatinine: 2.27 mg/dL — ABNORMAL HIGH (ref 0.61–1.24)
GFR, Estimated: 28 mL/min — ABNORMAL LOW (ref >60)
Glucose, Bld: 657 mg/dL (ref 70–99)
Potassium: 4.2 mmol/L (ref 3.5–5.1)
Sodium: 127 mmol/L — ABNORMAL LOW (ref 135–145)
Total Bilirubin: 0.5 mg/dL (ref 0.3–1.2)
Total Protein: 6 g/dL — ABNORMAL LOW (ref 6.5–8.1)

## 2021-03-03 LAB — CBG MONITORING, ED
Glucose-Capillary: 584 mg/dL (ref 70–99)
Glucose-Capillary: 429 mg/dL — ABNORMAL HIGH (ref 70–99)
Glucose-Capillary: 419 mg/dL — ABNORMAL HIGH (ref 70–99)
Glucose-Capillary: 344 mg/dL — ABNORMAL HIGH (ref 70–99)
Glucose-Capillary: 237 mg/dL — ABNORMAL HIGH (ref 70–99)
Glucose-Capillary: 181 mg/dL — ABNORMAL HIGH (ref 70–99)

## 2021-03-03 LAB — CBC WITH DIFFERENTIAL/PLATELET
Abs Immature Granulocytes: 0 10*3/uL (ref 0.00–0.07)
Basophils Absolute: 0 10*3/uL (ref 0.0–0.1)
Basophils Relative: 1 %
Eosinophils Absolute: 0 10*3/uL (ref 0.0–0.5)
Eosinophils Relative: 2 %
HCT: 25.5 % — ABNORMAL LOW (ref 39.0–52.0)
Hemoglobin: 8.5 g/dL — ABNORMAL LOW (ref 13.0–17.0)
Lymphocytes Relative: 38 %
Lymphs Abs: 0.6 10*3/uL — ABNORMAL LOW (ref 0.7–4.0)
MCH: 33.1 pg (ref 26.0–34.0)
MCHC: 33.3 g/dL (ref 30.0–36.0)
MCV: 99.2 fL (ref 80.0–100.0)
Monocytes Absolute: 0.1 10*3/uL (ref 0.1–1.0)
Monocytes Relative: 6 %
Neutro Abs: 0.8 10*3/uL — ABNORMAL LOW (ref 1.7–7.7)
Neutrophils Relative %: 53 %
Platelets: 136 10*3/uL — ABNORMAL LOW (ref 150–400)
RBC: 2.57 MIL/uL — ABNORMAL LOW (ref 4.22–5.81)
RDW: 16.2 % — ABNORMAL HIGH (ref 11.5–15.5)
WBC: 1.5 10*3/uL — ABNORMAL LOW (ref 4.0–10.5)
nRBC: 0 % (ref 0.0–0.2)

## 2021-03-03 LAB — RESP PANEL BY RT-PCR (FLU A&B, COVID) ARPGX2
Influenza A by PCR: NEGATIVE
Influenza B by PCR: NEGATIVE
SARS Coronavirus 2 by RT PCR: NEGATIVE

## 2021-03-03 LAB — BLOOD GAS, VENOUS
Acid-Base Excess: 3.7 mmol/L — ABNORMAL HIGH (ref 0.0–2.0)
Bicarbonate: 29.2 mmol/L — ABNORMAL HIGH (ref 20.0–28.0)
O2 Saturation: 57 %
Patient temperature: 98.6
pCO2, Ven: 52.9 mmHg (ref 44.0–60.0)
pH, Ven: 7.361 (ref 7.250–7.430)
pO2, Ven: 32.4 mmHg (ref 32.0–45.0)

## 2021-03-03 LAB — LACTIC ACID, PLASMA
Lactic Acid, Venous: 1.7 mmol/L (ref 0.5–1.9)
Lactic Acid, Venous: 2.7 mmol/L (ref 0.5–1.9)
Lactic Acid, Venous: 2.1 mmol/L (ref 0.5–1.9)
Lactic Acid, Venous: 1.1 mmol/L (ref 0.5–1.9)

## 2021-03-03 LAB — COMPREHENSIVE METABOLIC PANEL
ALT: 68 U/L — ABNORMAL HIGH (ref 0–44)
AST: 31 U/L (ref 15–41)
Albumin: 3 g/dL — ABNORMAL LOW (ref 3.5–5.0)
Alkaline Phosphatase: 98 U/L (ref 38–126)
Anion gap: 8 (ref 5–15)
BUN: 43 mg/dL — ABNORMAL HIGH (ref 8–23)
CO2: 28 mmol/L (ref 22–32)
Calcium: 8.5 mg/dL — ABNORMAL LOW (ref 8.9–10.3)
Chloride: 90 mmol/L — ABNORMAL LOW (ref 98–111)
Creatinine, Ser: 1.76 mg/dL — ABNORMAL HIGH (ref 0.61–1.24)
GFR, Estimated: 38 mL/min — ABNORMAL LOW (ref >60)
Glucose, Bld: 572 mg/dL (ref 70–99)
Potassium: 3.7 mmol/L (ref 3.5–5.1)
Sodium: 126 mmol/L — ABNORMAL LOW (ref 135–145)
Total Bilirubin: 0.6 mg/dL (ref 0.3–1.2)
Total Protein: 5.5 g/dL — ABNORMAL LOW (ref 6.5–8.1)

## 2021-03-03 LAB — URIC ACID: Uric Acid, Serum: 2.3 mg/dL — ABNORMAL LOW (ref 3.7–8.6)

## 2021-03-03 LAB — LACTATE DEHYDROGENASE: LDH: 173 U/L (ref 98–192)

## 2021-03-03 LAB — BETA-HYDROXYBUTYRIC ACID: Beta-Hydroxybutyric Acid: 0.12 mmol/L (ref 0.05–0.27)

## 2021-03-03 MED ORDER — SODIUM CHLORIDE 0.9 % IV SOLN
Freq: Once | INTRAVENOUS | Status: AC
  Filled 2021-03-03: qty 250

## 2021-03-03 MED ORDER — INSULIN ASPART 100 UNIT/ML IJ SOLN
15.0000 [IU] | Freq: Once | INTRAMUSCULAR | Status: AC
  Administered 2021-03-03: 15 [IU] via SUBCUTANEOUS
  Filled 2021-03-03: qty 0.15

## 2021-03-03 MED ORDER — HEPARIN SOD (PORK) LOCK FLUSH 100 UNIT/ML IV SOLN
500.0000 [IU] | Freq: Every day | INTRAVENOUS | Status: DC | PRN
  Filled 2021-03-03: qty 5

## 2021-03-03 MED ORDER — DEXTROSE IN LACTATED RINGERS 5 % IV SOLN: INTRAVENOUS | Status: DC

## 2021-03-03 MED ORDER — SODIUM CHLORIDE 0.9% FLUSH
10.0000 mL | INTRAVENOUS | Status: DC | PRN
Start: 2021-03-03 — End: 2021-03-03
  Filled 2021-03-03: qty 10

## 2021-03-03 MED ORDER — SODIUM CHLORIDE 0.9 % IV BOLUS
500.0000 mL | Freq: Once | INTRAVENOUS | Status: AC
  Administered 2021-03-03: 500 mL via INTRAVENOUS

## 2021-03-03 MED ORDER — HEPARIN SODIUM (PORCINE) 5000 UNIT/ML IJ SOLN
5000.0000 [IU] | Freq: Three times a day (TID) | INTRAMUSCULAR | Status: DC
  Administered 2021-03-03 – 2021-03-04 (×2): 5000 [IU] via SUBCUTANEOUS
  Filled 2021-03-03 (×2): qty 1

## 2021-03-03 MED ORDER — DEXTROSE 50 % IV SOLN: 0.0000 mL | INTRAVENOUS | Status: DC | PRN

## 2021-03-03 MED ORDER — LACTATED RINGERS IV SOLN: INTRAVENOUS | Status: DC

## 2021-03-03 MED ORDER — POTASSIUM CHLORIDE 10 MEQ/100ML IV SOLN
10.0000 meq | INTRAVENOUS | Status: AC
  Administered 2021-03-03 (×2): 10 meq via INTRAVENOUS
  Filled 2021-03-03 (×2): qty 100

## 2021-03-03 MED ORDER — INSULIN REGULAR(HUMAN) IN NACL 100-0.9 UT/100ML-% IV SOLN
INTRAVENOUS | Status: DC
  Administered 2021-03-03: 13 [IU]/h via INTRAVENOUS
  Filled 2021-03-03: qty 100

## 2021-03-03 MED ORDER — SODIUM CHLORIDE 0.9% FLUSH
10.0000 mL | Freq: Once | INTRAVENOUS | Status: AC
Start: 2021-03-03 — End: 2021-03-03
  Administered 2021-03-03: 10 mL
  Filled 2021-03-03: qty 10

## 2021-03-03 NOTE — Progress Notes (Signed)
Per M. Rodgers RN, patient will not be receiving chemo today, just IV fluids.  CBG over 600, BP very low as documented on flowsheet.  61 - Pt transported to [facility] by [facility] in stable condition, report given to [place] RN.

## 2021-03-03 NOTE — ED Notes (Signed)
Per oncologist-pateint in clinic with a CBG over 600, patient is also hypotensive

## 2021-03-03 NOTE — ED Triage Notes (Signed)
Brought from cancer center, CBG read high, on CMP it was in the 600s, BP was in the 80s, provider at cancer center ordered 1L fluids and transfer to ED. Pt has been feeling weak, takes prednisone as part of cancer trt, alert and oriented.

## 2021-03-03 NOTE — Progress Notes (Signed)
Blood sugar obtain cbg results were over 600. Stat cmet sent.

## 2021-03-03 NOTE — Patient Instructions (Signed)

## 2021-03-03 NOTE — ED Provider Notes (Signed)
[facility] DEPT Provider Note   CSN: [id number] Arrival date & time: 03/03/21  1352     History Chief Complaint  Patient presents with   Hyperglycemia   Hypotension    Rodney Lee is a 80 y.o. male.  Patient with history of lymphoma receiving chemotherapy presents to ER chief complaint of abnormal blood sugars generalized fatigue and weakness.  He states that his glucometer has not been working for the past week or 2 and he has not been checking his blood sugars.  His wife thinks she may have fixed just today and then noted it was high.  They took him in for his chemotherapy appointment nevertheless, and repeat labs at the infusion center continue to show hyperglycemia and he was sent to the ER for evaluation.  He otherwise denies any headache or chest pain or abdominal pain.  Denies fevers or cough.  Denies vomiting or diarrhea.      Past Medical History:  Diagnosis Date   Atrial fibrillation ([place])    CHADS2Vasc is at least 5, on Xarelto   CAD (coronary artery disease)    cardiomyopathy    MDT ICD, Dr. Lovena Le 2009   CHF (congestive heart failure) ([place])    Diabetes mellitus    Exogenous obesity    Hyperlipidemia    Hypertension    Hypothyroidism    MI, old 10   ANTEROLATERAL, PTCA LAD per pt   Sinus bradycardia     Patient Active Problem List   Diagnosis Date Noted   Port-A-Cath in place 02/03/2021   High grade B-cell lymphoma ([place]) 01/26/2021   VF (ventricular fibrillation) ([place]) 03/01/2017   Trigger finger, left middle finger 02/27/2017   Coronary artery disease involving native coronary artery of native heart with angina pectoris ([place]) 10/16/2016   LBBB (left bundle branch block) 10/16/2016   Stage 3 chronic kidney disease ([place]) 10/16/2016   Ventricular tachycardia (paroxysmal) ([place]) 01/21/2015   Diabetes mellitus due to underlying condition with stage 3 chronic kidney disease ([place]) 02/13/2014   PAF (paroxysmal atrial  fibrillation) ([place]) 10/08/2013   Chronic ischemic heart disease, unspecified 06/20/2011   Dyslipidemia 06/20/2011   Ischemic cardiomyopathy 05/16/2009   SINUS BRADYCARDIA 40/98/1191   Chronic systolic CHF (congestive heart failure) ([place]) 05/16/2009   Automatic implantable cardioverter-defibrillator in situ 05/16/2009    Past Surgical History:  Procedure Laterality Date   ANGIOPLASTY     CARDIAC CATHETERIZATION  03/13/1994   ACUTE MI WITH TOTAL OCCLUSION OF MID LAD. REPERFUSION AFTER CROSSING WITH A GUIDEWIRE. POOR RIGHT TO LEFT COLLATERAL FLOW. Pt states had PTCA   CARDIAC DEFIBRILLATOR PLACEMENT     CARDIOVASCULAR STRESS TEST  03/11/2008   EF 30%. NO REVERSIBLE ISCHEMIA   EP IMPLANTABLE DEVICE N/A 09/28/2015   Procedure:  ICD Generator Changeout;  Surgeon: Evans Lance, MD;  Location: [facility];  Service: Cardiovascular;  Laterality: N/A;   IR IMAGING GUIDED PORT INSERTION  01/28/2021   US ECHOCARDIOGRAPHY  04/26/2010   EF 35-40%. MODERATE LVH WITH MODERATE GLOBAL LV SYSTOLIC DYSFUNCTION AND IMPAIRED RELAXATION. MILD AORTIC SCLEROSIS WITHOUT STENOSIS. NORMAL PULMONARY ARTERY PRESSURE.   US ECHOCARDIOGRAPHY  10/07/2004   EF 30-35%       Family History  Problem Relation Age of Onset   Heart disease Mother    Heart attack Mother     Social History   Tobacco Use   Smoking status: Never   Smokeless tobacco: Never  Vaping Use   Vaping Use: Never  used  Substance Use Topics   Alcohol use: No   Drug use: No    Home Medications Prior to Admission medications   Medication Sig Start Date End Date Taking? Authorizing Provider  allopurinol (ZYLOPRIM) 300 MG tablet Take 1 tablet (300 mg total) by mouth daily. 01/26/21   Orson Slick, MD  amiodarone (PACERONE) 200 MG tablet TAKE 1 TABLET BY MOUTH  DAILY 01/18/21   Croitoru, Mihai, MD  carvedilol (COREG) 6.25 MG tablet TAKE 1 TABLET BY MOUTH  TWICE DAILY WITH A MEAL 02/26/20   Croitoru, Mihai, MD  citalopram (CELEXA) 20 MG  tablet 1 tablet    [provider]  fexofenadine (ALLEGRA) 60 MG tablet 1 capsule 11/10/20   [provider]  glimepiride (AMARYL) 1 MG tablet Take 1 mg by mouth daily as needed. 05/26/17   [provider]  isosorbide mononitrate (IMDUR) 60 MG 24 hr tablet Take 60 mg in the morning and 30 mg (half a tablet) in the evening. 09/03/20   Croitoru, Mihai, MD  levothyroxine (SYNTHROID) 125 MCG tablet Take 125 mcg by mouth daily. 01/19/21   [provider]  lidocaine-prilocaine (EMLA) cream Apply 1 application topically as needed. 01/26/21   Orson Slick, MD  Multiple Vitamin (MULTIVITAMIN) tablet Take 1 tablet by mouth daily.    [provider]  nitroGLYCERIN (NITROSTAT) 0.4 MG SL tablet Place 1 tablet (0.4 mg total) under the tongue every 5 (five) minutes as needed for chest pain. 09/03/20   Croitoru, Mihai, MD  ondansetron (ZOFRAN) 8 MG tablet Take 1 tablet (8 mg total) by mouth every 8 (eight) hours as needed for nausea or vomiting. 01/26/21   Orson Slick, MD  [facility] ULTRA test strip USE 1 STRIP TO CHECK GLUCOSE AS DIRECTED 01/14/21   [provider]  predniSONE (DELTASONE) 20 MG tablet Take 3 tablets (60 mg total) by mouth as directed. Take 60 mg  ( 3 tablets) by mouth every morning on Days 1 to 5 of chemotherapy. 01/26/21   Orson Slick, MD  prochlorperazine (COMPAZINE) 10 MG tablet TAKE 1 TABLET BY MOUTH EVERY 6 HOURS AS NEEDED FOR NAUSEA OR  VOMITING 02/11/21   Orson Slick, MD  repaglinide (PRANDIN) 1 MG tablet  08/04/19   [provider]  rosuvastatin (CRESTOR) 40 MG tablet Take 40 mg by mouth daily.    Gaynelle Arabian, MD  vitamin C (ASCORBIC ACID) 500 MG tablet Take 500 mg by mouth daily.    [provider]    Allergies    Canagliflozin and Lipitor [atorvastatin calcium]  Review of Systems   Review of Systems  Constitutional:  Negative for fever.  HENT:  Negative for ear pain and sore throat.   Eyes:   Negative for pain.  Respiratory:  Negative for cough.   Cardiovascular:  Negative for chest pain.  Gastrointestinal:  Negative for abdominal pain.  Genitourinary:  Negative for flank pain.  Musculoskeletal:  Negative for back pain.  Skin:  Negative for color change and rash.  Neurological:  Negative for syncope.  All other systems reviewed and are negative.  Physical Exam Updated Vital Signs BP (!) 104/55   Pulse 69   Temp 97.8 F (36.6 C) (Oral)   Resp 18   Ht 5\' 11"  (1.803 m)   Wt 86.5 kg   SpO2 96%   BMI 26.60 kg/m   Physical Exam Constitutional:      General: He is not in  acute distress.    Appearance: He is well-developed.  HENT:     Head: Normocephalic.     Nose: Nose normal.  Eyes:     Extraocular Movements: Extraocular movements intact.  Cardiovascular:     Rate and Rhythm: Normal rate.  Pulmonary:     Effort: Pulmonary effort is normal.  Skin:    Coloration: Skin is not jaundiced.  Neurological:     Mental Status: He is alert. Mental status is at baseline.    ED Results / Procedures / Treatments   Labs (all labs ordered are listed, but only abnormal results are displayed) Labs Reviewed  COMPREHENSIVE METABOLIC PANEL - Abnormal; Notable for the following components:      Result Value   Sodium 126 (*)    Chloride 90 (*)    Glucose, Bld 572 (*)    BUN 43 (*)    Creatinine, Ser 1.76 (*)    Calcium 8.5 (*)    Total Protein 5.5 (*)    Albumin 3.0 (*)    ALT 68 (*)    GFR, Estimated 38 (*)    All other components within normal limits  CBC WITH DIFFERENTIAL/PLATELET - Abnormal; Notable for the following components:   WBC 1.5 (*)    RBC 2.57 (*)    Hemoglobin 8.5 (*)    HCT 25.5 (*)    RDW 16.2 (*)    Platelets 136 (*)    Neutro Abs 0.8 (*)    Lymphs Abs 0.6 (*)    All other components within normal limits  BLOOD GAS, VENOUS - Abnormal; Notable for the following components:   Bicarbonate 29.2 (*)    Acid-Base Excess 3.7 (*)    All other  components within normal limits  CBG MONITORING, ED - Abnormal; Notable for the following components:   Glucose-Capillary 584 (*)    All other components within normal limits  CULTURE, BLOOD (ROUTINE X 2)  CULTURE, BLOOD (ROUTINE X 2)  RESP PANEL BY RT-PCR (FLU A&B, COVID) ARPGX2  LACTIC ACID, PLASMA  BETA-HYDROXYBUTYRIC ACID  LACTIC ACID, PLASMA  HEMOGLOBIN A1C  CBG MONITORING, ED    EKG EKG Interpretation  Date/Time:  Wednesday March 03 2021 14:55:13 EDT Ventricular Rate:  60 PR Interval:  238 QRS Duration: 172 QT Interval:  510 QTC Calculation: 510 R Axis:   -53 Text Interpretation: Atrial-paced rhythm with prolonged AV conduction Left bundle branch block Abnormal ECG Confirmed by Thamas Jaegers ([id number]) on 03/03/2021 4:28:03 PM  Radiology No results found.  Procedures .Critical Care  Date/Time: 03/03/2021 6:46 PM Performed by: Luna Fuse, MD Authorized by: Luna Fuse, MD   Critical care provider statement:    Critical care time (minutes):  30   Critical care time was exclusive of:  Separately billable procedures and treating other patients   Critical care was necessary to treat or prevent imminent or life-threatening deterioration of the following conditions:  Circulatory failure and endocrine crisis   Medications Ordered in ED Medications  sodium chloride 0.9 % bolus 500 mL (0 mLs Intravenous Stopped 03/03/21 1600)  insulin aspart (novoLOG) injection 15 Units (15 Units Subcutaneous Given 03/03/21 1732)    ED Course  I have reviewed the triage vital signs and the nursing notes.  Pertinent labs & imaging results that were available during my care of the patient were reviewed by me and considered in my medical decision making (see chart for details).  Clinical Course as of 03/03/21 1846  Wed Mar 03, 2021  1631 WBC(!): 1.5 [JH]    Clinical Course User Index [JH] Luna Fuse, MD   MDM Rules/Calculators/A&P                          Patient's blood sugar  continues to be high in the greater than 500 range.  However no evidence of DKA bicarb is normal anion gap is normal as well.  Blood pressures are slightly soft with blood pressures in the 89 systolic a few times.  This appears to be improved with IV fluid hydration current blood pressure is 818 systolic.  Patient is taking prednisone 60 mg daily for his chemotherapy regimen and this has been started recently I suspect this is the cause for his hyperglycemic episodes in addition to his poor adherence to his regimen at home.  I feel he will need likely insulin supplementation at least during his chemotherapy sessions.  Patient given 15 units insulin here in the ER.  Admitted to the hospitalist team.   Final Clinical Impression(s) / ED Diagnoses Final diagnoses:  Hyperglycemia  Dehydration  Hypotensive episode    Rx / DC Orders ED Discharge Orders     None        Luna Fuse, MD 03/03/21 [phone]

## 2021-03-03 NOTE — Progress Notes (Signed)
Called to flush room by LPN due to low blood pressure on patient of 77/44.  Patient also reported that home blood sugar was 600 before he arrived.  He does not feel well either.  Stat labs drawn including a CMET.  Blood sugar obtained via finger stick and it was over 600.  MD aware and decided to send the patient to the ED.  ED charge nurse called and there is currently no bed.  IV fluids started for low BP and report given to infusion nurse Bethena Roys.  Will continue to monitor until the ED has an available bed. Gardiner Rhyme, RN

## 2021-03-04 ENCOUNTER — Inpatient Hospital Stay: Payer: Medicare Other

## 2021-03-04 ENCOUNTER — Ambulatory Visit (INDEPENDENT_AMBULATORY_CARE_PROVIDER_SITE_OTHER): Payer: Medicare Other

## 2021-03-04 DIAGNOSIS — Z79899 Other long term (current) drug therapy: Secondary | ICD-10-CM | POA: Diagnosis not present

## 2021-03-04 DIAGNOSIS — I480 Paroxysmal atrial fibrillation: Secondary | ICD-10-CM | POA: Diagnosis not present

## 2021-03-04 DIAGNOSIS — L8990 Pressure ulcer of unspecified site, unspecified stage: Secondary | ICD-10-CM | POA: Insufficient documentation

## 2021-03-04 DIAGNOSIS — I5022 Chronic systolic (congestive) heart failure: Secondary | ICD-10-CM | POA: Diagnosis not present

## 2021-03-04 DIAGNOSIS — E1165 Type 2 diabetes mellitus with hyperglycemia: Secondary | ICD-10-CM

## 2021-03-04 DIAGNOSIS — Z20822 Contact with and (suspected) exposure to covid-19: Secondary | ICD-10-CM | POA: Diagnosis not present

## 2021-03-04 DIAGNOSIS — I2510 Atherosclerotic heart disease of native coronary artery without angina pectoris: Secondary | ICD-10-CM | POA: Diagnosis not present

## 2021-03-04 DIAGNOSIS — I9589 Other hypotension: Secondary | ICD-10-CM | POA: Diagnosis not present

## 2021-03-04 DIAGNOSIS — Z95810 Presence of automatic (implantable) cardiac defibrillator: Secondary | ICD-10-CM | POA: Diagnosis not present

## 2021-03-04 DIAGNOSIS — E1122 Type 2 diabetes mellitus with diabetic chronic kidney disease: Secondary | ICD-10-CM | POA: Diagnosis not present

## 2021-03-04 DIAGNOSIS — N183 Chronic kidney disease, stage 3 unspecified: Secondary | ICD-10-CM | POA: Diagnosis not present

## 2021-03-04 DIAGNOSIS — E039 Hypothyroidism, unspecified: Secondary | ICD-10-CM

## 2021-03-04 DIAGNOSIS — I130 Hypertensive heart and chronic kidney disease with heart failure and stage 1 through stage 4 chronic kidney disease, or unspecified chronic kidney disease: Secondary | ICD-10-CM | POA: Diagnosis not present

## 2021-03-04 DIAGNOSIS — I255 Ischemic cardiomyopathy: Secondary | ICD-10-CM

## 2021-03-04 DIAGNOSIS — D61818 Other pancytopenia: Secondary | ICD-10-CM

## 2021-03-04 DIAGNOSIS — E861 Hypovolemia: Secondary | ICD-10-CM | POA: Diagnosis not present

## 2021-03-04 DIAGNOSIS — I959 Hypotension, unspecified: Secondary | ICD-10-CM | POA: Diagnosis not present

## 2021-03-04 LAB — BASIC METABOLIC PANEL
Anion gap: 5 (ref 5–15)
Anion gap: 6 (ref 5–15)
BUN: 37 mg/dL — ABNORMAL HIGH (ref 8–23)
BUN: 38 mg/dL — ABNORMAL HIGH (ref 8–23)
CO2: 30 mmol/L (ref 22–32)
CO2: 32 mmol/L (ref 22–32)
Calcium: 8.3 mg/dL — ABNORMAL LOW (ref 8.9–10.3)
Calcium: 8.6 mg/dL — ABNORMAL LOW (ref 8.9–10.3)
Chloride: 96 mmol/L — ABNORMAL LOW (ref 98–111)
Chloride: 96 mmol/L — ABNORMAL LOW (ref 98–111)
Creatinine, Ser: 1.59 mg/dL — ABNORMAL HIGH (ref 0.61–1.24)
Creatinine, Ser: 1.65 mg/dL — ABNORMAL HIGH (ref 0.61–1.24)
GFR, Estimated: 41 mL/min — ABNORMAL LOW (ref >60)
GFR, Estimated: 43 mL/min — ABNORMAL LOW (ref >60)
Glucose, Bld: 117 mg/dL — ABNORMAL HIGH (ref 70–99)
Glucose, Bld: 160 mg/dL — ABNORMAL HIGH (ref 70–99)
Potassium: 3.6 mmol/L (ref 3.5–5.1)
Potassium: 3.8 mmol/L (ref 3.5–5.1)
Sodium: 131 mmol/L — ABNORMAL LOW (ref 135–145)
Sodium: 134 mmol/L — ABNORMAL LOW (ref 135–145)

## 2021-03-04 LAB — CBC
HCT: 24.9 % — ABNORMAL LOW (ref 39.0–52.0)
Hemoglobin: 8.4 g/dL — ABNORMAL LOW (ref 13.0–17.0)
MCH: 33.5 pg (ref 26.0–34.0)
MCHC: 33.7 g/dL (ref 30.0–36.0)
MCV: 99.2 fL (ref 80.0–100.0)
Platelets: 126 10*3/uL — ABNORMAL LOW (ref 150–400)
RBC: 2.51 MIL/uL — ABNORMAL LOW (ref 4.22–5.81)
RDW: 16.2 % — ABNORMAL HIGH (ref 11.5–15.5)
WBC: 1.3 10*3/uL — CL (ref 4.0–10.5)
nRBC: 0 % (ref 0.0–0.2)

## 2021-03-04 LAB — GLUCOSE, CAPILLARY
Glucose-Capillary: 117 mg/dL — ABNORMAL HIGH (ref 70–99)
Glucose-Capillary: 134 mg/dL — ABNORMAL HIGH (ref 70–99)
Glucose-Capillary: 141 mg/dL — ABNORMAL HIGH (ref 70–99)
Glucose-Capillary: 162 mg/dL — ABNORMAL HIGH (ref 70–99)
Glucose-Capillary: 278 mg/dL — ABNORMAL HIGH (ref 70–99)
Glucose-Capillary: 99 mg/dL (ref 70–99)
Glucose-Capillary: >600 mg/dL (ref 70–99)

## 2021-03-04 LAB — MRSA PCR SCREENING: MRSA by PCR: NEGATIVE

## 2021-03-04 LAB — MAGNESIUM: Magnesium: 2.1 mg/dL (ref 1.7–2.4)

## 2021-03-04 MED ORDER — AMIODARONE HCL 200 MG PO TABS
200.0000 mg | ORAL_TABLET | Freq: Every day | ORAL | Status: DC
  Administered 2021-03-04: 200 mg via ORAL
  Filled 2021-03-04: qty 1

## 2021-03-04 MED ORDER — LEVOTHYROXINE SODIUM 25 MCG PO TABS
125.0000 ug | ORAL_TABLET | Freq: Every day | ORAL | Status: DC
  Administered 2021-03-04: 125 ug via ORAL
  Filled 2021-03-04: qty 1

## 2021-03-04 MED ORDER — HEPARIN SOD (PORK) LOCK FLUSH 100 UNIT/ML IV SOLN
500.0000 [IU] | Freq: Once | INTRAVENOUS | Status: AC
  Administered 2021-03-04: 500 [IU]

## 2021-03-04 MED ORDER — INSULIN STARTER KIT- PEN NEEDLES (ENGLISH)
1.0000 | Freq: Once | Status: DC
  Filled 2021-03-04: qty 1

## 2021-03-04 MED ORDER — CITALOPRAM HYDROBROMIDE 20 MG PO TABS
20.0000 mg | ORAL_TABLET | Freq: Every day | ORAL | Status: DC
  Administered 2021-03-04: 20 mg via ORAL
  Filled 2021-03-04: qty 1

## 2021-03-04 MED ORDER — INSULIN ASPART 100 UNIT/ML IJ SOLN: 0.0000 [IU] | Freq: Three times a day (TID) | INTRAMUSCULAR | Status: DC

## 2021-03-04 MED ORDER — INSULIN ASPART 100 UNIT/ML IJ SOLN
0.0000 [IU] | Freq: Three times a day (TID) | INTRAMUSCULAR | Status: DC
  Administered 2021-03-04: 8 [IU] via SUBCUTANEOUS
  Administered 2021-03-04: 3 [IU] via SUBCUTANEOUS

## 2021-03-04 MED ORDER — ROSUVASTATIN CALCIUM 20 MG PO TABS
40.0000 mg | ORAL_TABLET | Freq: Every day | ORAL | Status: DC
  Administered 2021-03-04: 40 mg via ORAL
  Filled 2021-03-04: qty 2

## 2021-03-04 MED ORDER — INSULIN ASPART 100 UNIT/ML IJ SOLN
0.0000 [IU] | INTRAMUSCULAR | Status: DC
Start: 2021-03-04 — End: 2021-03-04

## 2021-03-04 MED ORDER — ORAL CARE MOUTH RINSE
15.0000 mL | Freq: Two times a day (BID) | OROMUCOSAL | Status: DC
  Administered 2021-03-04 (×2): 15 mL via OROMUCOSAL

## 2021-03-04 MED ORDER — CHLORHEXIDINE GLUCONATE CLOTH 2 % EX PADS
6.0000 | MEDICATED_PAD | Freq: Every day | CUTANEOUS | Status: DC
  Administered 2021-03-04: 6 via TOPICAL

## 2021-03-04 MED ORDER — LIDOCAINE-PRILOCAINE 2.5-2.5 % EX CREA: 1.0000 "application " | TOPICAL_CREAM | Freq: Every day | CUTANEOUS | Status: DC | PRN

## 2021-03-04 MED ORDER — INSULIN LISPRO (1 UNIT DIAL) 100 UNIT/ML (KWIKPEN): PEN_INJECTOR | SUBCUTANEOUS | 0 refills | Status: AC

## 2021-03-04 MED ORDER — INSULIN PEN NEEDLE 31G X 5 MM MISC: 0 refills | Status: AC

## 2021-03-04 MED ORDER — ALLOPURINOL 100 MG PO TABS
300.0000 mg | ORAL_TABLET | Freq: Every day | ORAL | Status: DC
  Administered 2021-03-04: 300 mg via ORAL
  Filled 2021-03-04: qty 3

## 2021-03-04 MED ORDER — BLOOD GLUCOSE METER KIT: PACK | 0 refills | Status: AC

## 2021-03-04 MED ORDER — POTASSIUM CHLORIDE 10 MEQ/100ML IV SOLN
10.0000 meq | INTRAVENOUS | Status: AC
  Administered 2021-03-04 (×2): 10 meq via INTRAVENOUS
  Filled 2021-03-04 (×2): qty 100

## 2021-03-04 NOTE — Discharge Instructions (Signed)
Check your blood sugar 3 times daily: before breakfast, before lunch, before dinner. You may also check throughout the day if you feel that your blood sugar is too low or too high.  Humalog is your short-acting correction insulin. Check your blood sugar prior to your meal, three times daily. Then depending on your blood sugar, inject insulin as follows: Blood Glucose 121 - 150: 1 units  BG 151 - 200: 2 units  BG 201 - 250: 3 units  BG 251 - 300: 5 units  BG 301 - 350: 7 units  BG 351 - 400: 9 units  Keep a journal of your blood sugar results each day. Bring to your primary care physician.

## 2021-03-04 NOTE — Discharge Summary (Signed)
Physician Discharge Summary  Rodney Lee HKV:[id number] DOB: July 03, 1941 DOA: 03/03/2021  PCP: Gaynelle Arabian, MD  Admit date: 03/03/2021 Discharge date: 03/04/2021  Admitted From: Home Disposition:  Home  Recommendations for Outpatient Follow-up:  Follow up with PCP in 1 week  Discharge Condition: Stable CODE STATUS: Full code Diet recommendation: Heart healthy diet  Brief/Interim Summary: From H&P by Dr. Flossie Buffy: "Rodney Lee is a 80 y.o. male with medical history significant for advanced stage high-grade B-cell lymphoma, history of anemia and thrombocytopenia requiring transfusion, paroxysmal atrial fibrillation, history of V. fib with ICD, CKD 3, chronic systolic heart failure, type 2 diabetes and hypothyroidism who presents with concerns of increasing fatigue.   For the past 2 days he has had increasing fatigue as well as decreased p.o. intake.  No nausea, vomiting or diarrhea. No fever. No chest pain or shortness of breath. His glucometer has been malfunctioning for more than a week but today when they finally got to work he was noted to have BG of greater than 600.  Normally not on insulin.  He has been placed by oncology on steroids at the beginning of every chemo cycle.  States last dose was on 6/27.   Pt presented to oncology for infusion today but was not to have hypotension with BP of 77/44. He was given IV fluid and was sent to ED for further evaluation.   ED Course: He was hypotensive at 94/56 with improvement following IV fluids.  Has leukopenia with WBC 1.5, hemoglobin of 8.5, platelet of 136.  Sodium of 126.  K of 3.7.  BG of 572.  Creatinine of 1.76."  Subjective on day of discharge: Patient's blood pressure and blood sugar improved.  He was transitioned off insulin drip and put on subcutaneous.  Patient takes 60 mg prednisone for 5 days at the start of chemo cycle.  He states that his glucometer has not been working recently.  On examination, patient was feeling well without  any complaints.  Diabetic coordinator was consulted prior to discharge.  Discharge Diagnoses:  Principal Problem:   Hypotension Active Problems:   Chronic systolic CHF (congestive heart failure) (HCC)   Dyslipidemia   PAF (paroxysmal atrial fibrillation) (HCC)   Stage 3 chronic kidney disease (HCC)   Pressure injury of skin   Hyperglycemia due to diabetes mellitus (HCC)   Pancytopenia (HCC)   Hypothyroidism    In agreement with assessment of the pressure ulcer as below:  Pressure Injury 03/04/21 Coccyx Medial Stage 2 -  Partial thickness loss of dermis presenting as a shallow open injury with a red, pink wound bed without slough. 1cm x 1cm (Active)  03/04/21 0024  Location: Coccyx  Location Orientation: Medial  Staging: Stage 2 -  Partial thickness loss of dermis presenting as a shallow open injury with a red, pink wound bed without slough.  Wound Description (Comments): 1cm x 1cm  Present on Admission: Yes       Discharge Instructions  Discharge Instructions     Call MD for:  difficulty breathing, headache or visual disturbances   Complete by: As directed    Call MD for:  extreme fatigue   Complete by: As directed    Call MD for:  persistant dizziness or light-headedness   Complete by: As directed    Call MD for:  persistant nausea and vomiting   Complete by: As directed    Call MD for:  severe uncontrolled pain   Complete by: As directed  Call MD for:  temperature >100.4   Complete by: As directed    Discharge instructions   Complete by: As directed    You were cared for by a hospitalist during your hospital stay. If you have any questions about your discharge medications or the care you received while you were in the hospital after you are discharged, you can call the unit and ask to speak with the hospitalist on call if the hospitalist that took care of you is not available. Once you are discharged, your primary care physician will handle any further medical  issues. Please note that NO REFILLS for any discharge medications will be authorized once you are discharged, as it is imperative that you return to your primary care physician (or establish a relationship with a primary care physician if you do not have one) for your aftercare needs so that they can reassess your need for medications and monitor your lab values.   Increase activity slowly   Complete by: As directed    No wound care   Complete by: As directed       Allergies as of 03/04/2021       Reactions   Canagliflozin Other (See Comments)   Lipitor [atorvastatin Calcium]    Memory problems        Medication List     TAKE these medications    allopurinol 300 MG tablet Commonly known as: ZYLOPRIM Take 1 tablet (300 mg total) by mouth daily.   amiodarone 200 MG tablet Commonly known as: PACERONE TAKE 1 TABLET BY MOUTH  DAILY   blood glucose meter kit and supplies Dispense based on patient and insurance preference. Use up to four times daily as directed. (FOR ICD-10 E10.9, E11.9).   carvedilol 6.25 MG tablet Commonly known as: COREG TAKE 1 TABLET BY MOUTH  TWICE DAILY WITH A MEAL   citalopram 20 MG tablet Commonly known as: CELEXA Take 20 mg by mouth daily.   glimepiride 1 MG tablet Commonly known as: AMARYL Take 1 mg by mouth daily with breakfast.   insulin lispro 100 UNIT/ML KwikPen Commonly known as: HumaLOG KwikPen Use following sliding scale provided, at meal times as long as eating > 50% of your meal, up to three times daily.   Insulin Pen Needle 31G X 5 MM Misc Use with insulin up to three times daily with meals   isosorbide mononitrate 60 MG 24 hr tablet Commonly known as: IMDUR Take 60 mg in the morning and 30 mg (half a tablet) in the evening. What changed:  how much to take how to take this when to take this   levothyroxine 125 MCG tablet Commonly known as: SYNTHROID Take 125 mcg by mouth daily.   lidocaine-prilocaine cream Commonly known  as: EMLA Apply 1 application topically as needed. What changed:  when to take this reasons to take this   multivitamin tablet Take 1 tablet by mouth daily.   nitroGLYCERIN 0.4 MG SL tablet Commonly known as: NITROSTAT Place 1 tablet (0.4 mg total) under the tongue every 5 (five) minutes as needed for chest pain.   ondansetron 8 MG tablet Commonly known as: ZOFRAN Take 1 tablet (8 mg total) by mouth every 8 (eight) hours as needed for nausea or vomiting.   OneTouch Ultra test strip Generic drug: glucose blood USE 1 STRIP TO CHECK GLUCOSE AS DIRECTED   predniSONE 20 MG tablet Commonly known as: DELTASONE Take 3 tablets (60 mg total) by mouth as directed. Take 60 mg  (  3 tablets) by mouth every morning on Days 1 to 5 of chemotherapy.   prochlorperazine 10 MG tablet Commonly known as: COMPAZINE TAKE 1 TABLET BY MOUTH EVERY 6 HOURS AS NEEDED FOR NAUSEA OR  VOMITING What changed:  when to take this reasons to take this   repaglinide 2 MG tablet Commonly known as: PRANDIN Take 2 mg by mouth 2 (two) times daily before a meal.   rosuvastatin 40 MG tablet Commonly known as: CRESTOR Take 40 mg by mouth daily.   vitamin C 500 MG tablet Commonly known as: ASCORBIC ACID Take 500 mg by mouth daily.        Follow-up Information     Gaynelle Arabian, MD. Schedule an appointment as soon as possible for a visit in 1 week(s).   Specialty: Family Medicine Contact information: [address], [address] [place] [address] [phone]                Allergies  Allergen Reactions   Canagliflozin Other (See Comments)   Lipitor [Atorvastatin Calcium]     Memory problems    Consultations: None   Procedures/Studies: No results found.     Discharge Exam: Vitals:   03/04/21 1211 03/04/21 1300  BP:  (!) 132/58  Pulse:  60  Resp:  15  Temp: 98 F (36.7 C)   SpO2:  99%    General: Pt is alert, awake, not in acute distress Cardiovascular: S1/S2 +, no  edema Respiratory: CTA bilaterally, no wheezing, no rhonchi, no respiratory distress, no conversational dyspnea  Abdominal: Soft, NT, ND, bowel sounds + Extremities: no edema, no cyanosis Psych: Normal mood and affect, stable judgement and insight     The results of significant diagnostics from this hospitalization (including imaging, microbiology, ancillary and laboratory) are listed below for reference.     Microbiology: Recent Results (from the past 240 hour(s))  Culture, blood (routine x 2)     Status: None (Preliminary result)   Collection Time: 03/03/21  2:33 PM   Specimen: BLOOD RIGHT FOREARM  Result Value Ref Range Status   Specimen Description   Final    BLOOD RIGHT FOREARM Performed at [facility] [address] [place], [place] [address]    Special Requests   Final    BOTTLES DRAWN AEROBIC AND ANAEROBIC Blood Culture results may not be optimal due to an inadequate volume of blood received in culture bottles Performed at [facility] [address] [place], [place] [address]    Culture   Final    NO GROWTH < 12 HOURS Performed at [facility] [address], [place], [place] [address]    Report Status PENDING  Incomplete  Culture, blood (routine x 2)     Status: None (Preliminary result)   Collection Time: 03/03/21  2:55 PM   Specimen: BLOOD  Result Value Ref Range Status   Specimen Description   Final    BLOOD PORTA CATH Performed at [facility] [address] [place], [place] [address]    Special Requests   Final    BOTTLES DRAWN AEROBIC AND ANAEROBIC Blood Culture adequate volume Performed at [facility] [address] [place], [place] [address]    Culture   Final    NO GROWTH < 12 HOURS Performed at [facility] [address], [place],  [address]    Report Status PENDING  Incomplete  Resp Panel by RT-PCR (Flu A&B,  Covid) Nasopharyngeal Swab      Status: None   Collection Time: 03/03/21  5:31 PM   Specimen: Nasopharyngeal Swab; Nasopharyngeal(NP) swabs in vial transport medium  Result Value Ref Range Status   SARS Coronavirus 2 by RT PCR NEGATIVE NEGATIVE Final    Comment: (NOTE) SARS-CoV-2 target nucleic acids are NOT DETECTED.  The SARS-CoV-2 RNA is generally detectable in upper respiratory specimens during the acute phase of infection. The lowest concentration of SARS-CoV-2 viral copies this assay can detect is 138 copies/mL. A negative result does not preclude SARS-Cov-2 infection and should not be used as the sole basis for treatment or other patient management decisions. A negative result may occur with  improper specimen collection/handling, submission of specimen other than nasopharyngeal swab, presence of viral mutation(s) within the areas targeted by this assay, and inadequate number of viral copies(<138 copies/mL). A negative result must be combined with clinical observations, patient history, and epidemiological information. The expected result is Negative.  Fact Sheet for Patients:  EntrepreneurPulse.com.au  Fact Sheet for Healthcare Providers:  IncredibleEmployment.be  This test is no t yet approved or cleared by the [place] FDA and  has been authorized for detection and/or diagnosis of SARS-CoV-2 by FDA under an Emergency Use Authorization (EUA). This EUA will remain  in effect (meaning this test can be used) for the duration of the COVID-19 declaration under Section 564(b)(1) of the Act, 21 U.S.C.section 360bbb-3(b)(1), unless the authorization is terminated  or revoked sooner.       Influenza A by PCR NEGATIVE NEGATIVE Final   Influenza B by PCR NEGATIVE NEGATIVE Final    Comment: (NOTE) The Xpert Xpress SARS-CoV-2/FLU/RSV plus assay is intended as an aid in the diagnosis of influenza from Nasopharyngeal swab specimens and should not be used as a sole basis  for treatment. Nasal washings and aspirates are unacceptable for Xpert Xpress SARS-CoV-2/FLU/RSV testing.  Fact Sheet for Patients: EntrepreneurPulse.com.au  Fact Sheet for Healthcare Providers: IncredibleEmployment.be  This test is not yet approved or cleared by the [place] FDA and has been authorized for detection and/or diagnosis of SARS-CoV-2 by FDA under an Emergency Use Authorization (EUA). This EUA will remain in effect (meaning this test can be used) for the duration of the COVID-19 declaration under Section 564(b)(1) of the Act, 21 U.S.C. section 360bbb-3(b)(1), unless the authorization is terminated or revoked.  Performed at [facility], [place] [address], [place], [place] [address]   MRSA PCR Screening     Status: None   Collection Time: 03/04/21 12:00 AM  Result Value Ref Range Status   MRSA by PCR NEGATIVE NEGATIVE Final    Comment:        The GeneXpert MRSA Assay (FDA approved for NASAL specimens only), is one component of a comprehensive MRSA colonization surveillance program. It is not intended to diagnose MRSA infection nor to guide or monitor treatment for MRSA infections. Performed at [facility], [place] [address] [place],  [address]      Labs: BNP (last 3 results) No results for input(s): BNP in the last 8760 hours. Basic Metabolic Panel: Recent Labs  Lab 03/03/21 1242 03/03/21 1443 03/03/21 2055 03/03/21 2330 03/04/21 0230  NA 127* 126* 130* 131* 134*  K 4.2 3.7 3.6 3.6 3.8  CL 88* 90* 94* 96* 96*  CO2 _0 32  GLUCOSE 657* 572* 356* 160* 117*  BUN 41* 43* 42* 38* 37*  CREATININE 2.27* 1.76* 1.81* 1.59* 1.65*  CALCIUM  8.8* 8.5* 8.4* 8.3* 8.6*  MG  --   --   --  2.1  --    Liver Function Tests: Recent Labs  Lab 03/03/21 1242 03/03/21 1443  AST 32 31  ALT 77* 68*  ALKPHOS 145* 98  BILITOT 0.5 0.6  PROT 6.0* 5.5*  ALBUMIN 3.0* 3.0*   No  results for input(s): LIPASE, AMYLASE in the last 168 hours. No results for input(s): AMMONIA in the last 168 hours. CBC: Recent Labs  Lab 03/03/21 1242 03/03/21 1443 03/04/21 0110  WBC 1.4* 1.5* 1.3*  NEUTROABS 0.9* 0.8*  --   HGB 9.2* 8.5* 8.4*  HCT 27.3* 25.5* 24.9*  MCV 98.9 99.2 99.2  PLT 132* 136* 126*   Cardiac Enzymes: No results for input(s): CKTOTAL, CKMB, CKMBINDEX, TROPONINI in the last 168 hours. BNP: Invalid input(s): POCBNP CBG: Recent Labs  Lab 03/04/21 0120 03/04/21 0221 03/04/21 0314 03/04/21 0748 03/04/21 1209  GLUCAP 134* 117* 99 162* 278*   D-Dimer No results for input(s): DDIMER in the last 72 hours. Hgb A1c No results for input(s): HGBA1C in the last 72 hours. Lipid Profile No results for input(s): CHOL, HDL, LDLCALC, TRIG, CHOLHDL, LDLDIRECT in the last 72 hours. Thyroid function studies No results for input(s): TSH, T4TOTAL, T3FREE, THYROIDAB in the last 72 hours.  Invalid input(s): FREET3 Anemia work up No results for input(s): VITAMINB12, FOLATE, FERRITIN, TIBC, IRON, RETICCTPCT in the last 72 hours. Urinalysis    Component Value Date/Time   COLORURINE YELLOW 02/05/2021 1730   APPEARANCEUR CLEAR 02/05/2021 1730   LABSPEC 1.011 02/05/2021 1730   PHURINE 5.0 02/05/2021 1730   GLUCOSEU >=500 (A) 02/05/2021 1730   HGBUR MODERATE (A) 02/05/2021 1730   BILIRUBINUR NEGATIVE 02/05/2021 1730   KETONESUR NEGATIVE 02/05/2021 1730   PROTEINUR 30 (A) 02/05/2021 1730   NITRITE NEGATIVE 02/05/2021 1730   LEUKOCYTESUR NEGATIVE 02/05/2021 1730   Sepsis Labs Invalid input(s): PROCALCITONIN,  WBC,  LACTICIDVEN Microbiology Recent Results (from the past 240 hour(s))  Culture, blood (routine x 2)     Status: None (Preliminary result)   Collection Time: 03/03/21  2:33 PM   Specimen: BLOOD RIGHT FOREARM  Result Value Ref Range Status   Specimen Description   Final    BLOOD RIGHT FOREARM Performed at [facility], [place] [address] [place], [place] [address]    Special Requests   Final    BOTTLES DRAWN AEROBIC AND ANAEROBIC Blood Culture results may not be optimal due to an inadequate volume of blood received in culture bottles Performed at [facility] [address] [place], [place] [address]    Culture   Final    NO GROWTH < 12 HOURS Performed at [facility] [address] [place], [place] [address]    Report Status PENDING  Incomplete  Culture, blood (routine x 2)     Status: None (Preliminary result)   Collection Time: 03/03/21  2:55 PM   Specimen: BLOOD  Result Value Ref Range Status   Specimen Description   Final    BLOOD PORTA CATH Performed at [facility] [address], [place], [place] [address]    Special Requests   Final    BOTTLES DRAWN AEROBIC AND ANAEROBIC Blood Culture adequate volume Performed at [facility] [address] [place], [place] [address]    Culture   Final    NO GROWTH < 12 HOURS Performed at [facility]  [address], [place], [place] [address]    Report Status PENDING  Incomplete  Resp Panel by RT-PCR (Flu A&B, Covid) Nasopharyngeal Swab     Status: None   Collection Time: 03/03/21  5:31 PM   Specimen: Nasopharyngeal Swab; Nasopharyngeal(NP) swabs in vial transport medium  Result Value Ref Range Status   SARS Coronavirus 2 by RT PCR NEGATIVE NEGATIVE Final    Comment: (NOTE) SARS-CoV-2 target nucleic acids are NOT DETECTED.  The SARS-CoV-2 RNA is generally detectable in upper respiratory specimens during the acute phase of infection. The lowest concentration of SARS-CoV-2 viral copies this assay can detect is 138 copies/mL. A negative result does not preclude SARS-Cov-2 infection and should not be used as the sole basis for treatment or other patient management decisions. A negative result may occur with  improper specimen collection/handling, submission of specimen other than  nasopharyngeal swab, presence of viral mutation(s) within the areas targeted by this assay, and inadequate number of viral copies(<138 copies/mL). A negative result must be combined with clinical observations, patient history, and epidemiological information. The expected result is Negative.  Fact Sheet for Patients:  EntrepreneurPulse.com.au  Fact Sheet for Healthcare Providers:  IncredibleEmployment.be  This test is no t yet approved or cleared by the [place] FDA and  has been authorized for detection and/or diagnosis of SARS-CoV-2 by FDA under an Emergency Use Authorization (EUA). This EUA will remain  in effect (meaning this test can be used) for the duration of the COVID-19 declaration under Section 564(b)(1) of the Act, 21 U.S.C.section 360bbb-3(b)(1), unless the authorization is terminated  or revoked sooner.       Influenza A by PCR NEGATIVE NEGATIVE Final   Influenza B by PCR NEGATIVE NEGATIVE Final    Comment: (NOTE) The Xpert Xpress SARS-CoV-2/FLU/RSV plus assay is intended as an aid in the diagnosis of influenza from Nasopharyngeal swab specimens and should not be used as a sole basis for treatment. Nasal washings and aspirates are unacceptable for Xpert Xpress SARS-CoV-2/FLU/RSV testing.  Fact Sheet for Patients: EntrepreneurPulse.com.au  Fact Sheet for Healthcare Providers: IncredibleEmployment.be  This test is not yet approved or cleared by the [place] FDA and has been authorized for detection and/or diagnosis of SARS-CoV-2 by FDA under an Emergency Use Authorization (EUA). This EUA will remain in effect (meaning this test can be used) for the duration of the COVID-19 declaration under Section 564(b)(1) of the Act, 21 U.S.C. section 360bbb-3(b)(1), unless the authorization is terminated or revoked.  Performed at [facility], [place] [address] [place], [place] [address]   MRSA PCR Screening     Status: None   Collection Time: 03/04/21 12:00 AM  Result Value Ref Range Status   MRSA by PCR NEGATIVE NEGATIVE Final    Comment:        The GeneXpert MRSA Assay (FDA approved for NASAL specimens only), is one component of a comprehensive MRSA colonization surveillance program. It is not intended to diagnose MRSA infection nor to guide or monitor treatment for MRSA infections. Performed at [facility], [place] [address], [place], [place] [address]      Patient was seen and examined on the day of discharge and was found to be in stable condition. Time coordinating discharge: 35 minutes including assessment and coordination of care, as well as examination of the patient.   SIGNED:  Dessa Phi, DO [facility] 03/04/2021, 3:21 PM

## 2021-03-04 NOTE — Progress Notes (Signed)
Nurse Tech at bedside performing EKG on patient after ST elevation was seen on tele monitor. Patient denies any chest pain /discomfort and there are no visible changes in patient condition.

## 2021-03-04 NOTE — TOC Initial Note (Signed)
Transition of Care [facility]) - Initial/Assessment Note    Patient Details  Name: Rodney Lee MRN: [id number] Date of Birth: [birth year]  Transition of Care [facility]) CM/SW Contact:    Leeroy Cha, RN Phone Number: 03/04/2021, 7:43 AM  Clinical Narrative:                 80 y.o. male with medical history significant for advanced stage high-grade B-cell lymphoma, history of anemia and thrombocytopenia requiring transfusion, paroxysmal atrial fibrillation, history of V. fib with ICD, CKD 3, chronic systolic heart failure, type 2 diabetes and hypothyroidism who presents with concerns of increasing fatigue.   For the past 2 days he has had increasing fatigue as well as decreased p.o. intake.  No nausea, vomiting or diarrhea. No fever. No chest pain or shortness of breath. His glucometer has been malfunctioning for more than a week but today when they finally got to work he was noted to have BG of greater than 600.  Normally not on insulin.  He has been placed by oncology on steroids at the beginning of every chemo cycle.  States last dose was on 6/27.   Pt presented to oncology for infusion today but was not to have hypotension with BP of 77/44. He was given IV fluid and was sent to ED for further evaluation.   ED Course: He was hypotensive at 94/56 with improvement following IV fluids.  Has leukopenia with WBC 1.5, hemoglobin TOC PLAN OF CARE:  to return to home with self care and to wife. Expected Discharge Plan: Home/Self Care Barriers to Discharge: Continued Medical Work up   Patient Goals and CMS Choice Patient states their goals for this hospitalization and ongoing recovery are:: to return to my home CMS Medicare.gov Compare Post Acute Care list provided to:: Patient    Expected Discharge Plan and Services Expected Discharge Plan: Home/Self Care   Discharge Planning Services: CM Consult   Living arrangements for the past 2 months: Single Family Home                                       Prior Living Arrangements/Services Living arrangements for the past 2 months: Single Family Home Lives with:: Spouse Patient language and need for interpreter reviewed:: Yes Do you feel safe going back to the place where you live?: Yes            Criminal Activity/Legal Involvement Pertinent to Current Situation/Hospitalization: No - Comment as needed  Activities of Daily Living Home Assistive Devices/Equipment: Eyeglasses, Environmental consultant (specify type), Bedside commode/3-in-1, Wheelchair, Dentures (specify type) (upper partial) ADL Screening (condition at time of admission) Patient's cognitive ability adequate to safely complete daily activities?: No Is the patient deaf or have difficulty hearing?: Yes (left ear is deaf) Does the patient have difficulty seeing, even when wearing glasses/contacts?: No (glasses broke and has not had a chance to get new ones) Does the patient have difficulty concentrating, remembering, or making decisions?: Yes Patient able to express need for assistance with ADLs?: Yes Does the patient have difficulty dressing or bathing?: Yes Independently performs ADLs?: No Communication: Independent Dressing (OT): Needs assistance Is this a change from baseline?: Pre-admission baseline Grooming: Independent Feeding: Independent Bathing: Needs assistance Is this a change from baseline?: Pre-admission baseline Toileting: Needs assistance Is this a change from baseline?: Pre-admission baseline In/Out Bed: Needs assistance Is this a change from baseline?: Pre-admission baseline Walks  in Home: Needs assistance Is this a change from baseline?: Pre-admission baseline Does the patient have difficulty walking or climbing stairs?: Yes Weakness of Legs: Both Weakness of Arms/Hands: Both  Permission Sought/Granted                  Emotional Assessment Appearance:: Appears stated age Attitude/Demeanor/Rapport: Engaged Affect (typically observed):  Calm Orientation: : Oriented to Place, Oriented to Self, Oriented to  Time, Oriented to Situation Alcohol / Substance Use: Not Applicable Psych Involvement: No (comment)  Admission diagnosis:  Dehydration [E86.0] Hyperglycemia [R73.9] Hypotensive episode [I95.9] Hypotension [I95.9] Patient Active Problem List   Diagnosis Date Noted   Pressure injury of skin 03/04/2021   Hyperglycemia due to diabetes mellitus ([place]) 03/04/2021   Pancytopenia ([place]) 03/04/2021   Hypothyroidism 03/04/2021   Hypotension 03/03/2021   Port-A-Cath in place 02/03/2021   High grade B-cell lymphoma ([place]) 01/26/2021   VF (ventricular fibrillation) ([place]) 03/01/2017   Trigger finger, left middle finger 02/27/2017   Coronary artery disease involving native coronary artery of native heart with angina pectoris ([place]) 10/16/2016   LBBB (left bundle branch block) 10/16/2016   Stage 3 chronic kidney disease ([place]) 10/16/2016   Ventricular tachycardia (paroxysmal) ([place]) 01/21/2015   Diabetes mellitus due to underlying condition with stage 3 chronic kidney disease ([place]) 02/13/2014   PAF (paroxysmal atrial fibrillation) ([place]) 10/08/2013   Chronic ischemic heart disease, unspecified 06/20/2011   Dyslipidemia 06/20/2011   Ischemic cardiomyopathy 05/16/2009   SINUS BRADYCARDIA 07/21/5207   Chronic systolic CHF (congestive heart failure) ([place]) 05/16/2009   Automatic implantable cardioverter-defibrillator in situ 05/16/2009   PCP:  Gaynelle Arabian, MD Pharmacy:   [place], [place] - [address] [place] [address] Phone: [phone] Fax: [phone]     Social Determinants of Health (SDOH) Interventions    Readmission Risk Interventions No flowsheet data found.

## 2021-03-04 NOTE — H&P (Addendum)
History and Physical    Rodney Lee ZDG:[id number] DOB: Jan 09, 1941 DOA: 03/03/2021  PCP: Gaynelle Arabian, MD  Patient coming from: Home, wife at bedside  I have personally briefly reviewed patient's old medical records in [place]  Chief Complaint: fatigue  HPI: Rodney Lee is a 80 y.o. male with medical history significant for advanced stage high-grade B-cell lymphoma, history of anemia and thrombocytopenia requiring transfusion, paroxysmal atrial fibrillation, history of V. fib with ICD, CKD 3, chronic systolic heart failure, type 2 diabetes and hypothyroidism who presents with concerns of increasing fatigue.   For the past 2 days he has had increasing fatigue as well as decreased p.o. intake.  No nausea, vomiting or diarrhea. No fever. No chest pain or shortness of breath. His glucometer has been malfunctioning for more than a week but today when they finally got to work he was noted to have BG of greater than 600.  Normally not on insulin.  He has been placed by oncology on steroids at the beginning of every chemo cycle.  States last dose was on 6/27.   Pt presented to oncology for infusion today but was not to have hypotension with BP of 77/44. He was given IV fluid and was sent to ED for further evaluation.  ED Course: He was hypotensive at 94/56 with improvement following IV fluids.  Has leukopenia with WBC 1.5, hemoglobin of 8.5, platelet of 136.  Sodium of 126.  K of 3.7.  BG of 572.  Creatinine of 1.76.  Review of Systems: Constitutional: No Weight Change, No Fever ENT/Mouth: No sore throat, No Rhinorrhea Eyes: No Eye Pain, No Vision Changes Cardiovascular: No Chest Pain, no SOB Respiratory: No Cough, Gastrointestinal: No Nausea, No Vomiting, No Diarrhea, No Constipation, No Pain Genitourinary: no Urinary Incontinence, No Urgency, No Flank Pain Musculoskeletal: No Arthralgias, No Myalgias Skin: No Skin Lesions, No Pruritus, Neuro: no Weakness, No  Numbness Psych: No Anxiety/Panic, No Depression, + decrease appetite Heme/Lymph: No Bruising, No Bleeding  Past Medical History:  Diagnosis Date   Atrial fibrillation (HCC)    CHADS2Vasc is at least 5, on Xarelto   CAD (coronary artery disease)    cardiomyopathy    MDT ICD, Dr. Lovena Le 2009   CHF (congestive heart failure) ([place])    Diabetes mellitus    Exogenous obesity    Hyperlipidemia    Hypertension    Hypothyroidism    MI, old 65   ANTEROLATERAL, PTCA LAD per pt   Sinus bradycardia     Past Surgical History:  Procedure Laterality Date   ANGIOPLASTY     CARDIAC CATHETERIZATION  03/13/1994   ACUTE MI WITH TOTAL OCCLUSION OF MID LAD. REPERFUSION AFTER CROSSING WITH A GUIDEWIRE. POOR RIGHT TO LEFT COLLATERAL FLOW. Pt states had PTCA   CARDIAC DEFIBRILLATOR PLACEMENT     CARDIOVASCULAR STRESS TEST  03/11/2008   EF 30%. NO REVERSIBLE ISCHEMIA   EP IMPLANTABLE DEVICE N/A 09/28/2015   Procedure:  ICD Generator Changeout;  Surgeon: Evans Lance, MD;  Location: [facility];  Service: Cardiovascular;  Laterality: N/A;   IR IMAGING GUIDED PORT INSERTION  01/28/2021   US ECHOCARDIOGRAPHY  04/26/2010   EF 35-40%. MODERATE LVH WITH MODERATE GLOBAL LV SYSTOLIC DYSFUNCTION AND IMPAIRED RELAXATION. MILD AORTIC SCLEROSIS WITHOUT STENOSIS. NORMAL PULMONARY ARTERY PRESSURE.   US ECHOCARDIOGRAPHY  10/07/2004   EF 30-35%     reports that he has never smoked. He has never used smokeless tobacco. He reports that he does  not drink alcohol and does not use drugs. Social History  Allergies  Allergen Reactions   Canagliflozin Other (See Comments)   Lipitor [Atorvastatin Calcium]     Memory problems    Family History  Problem Relation Age of Onset   Heart disease Mother    Heart attack Mother      Prior to Admission medications   Medication Sig Start Date End Date Taking? Authorizing Provider  allopurinol (ZYLOPRIM) 300 MG tablet Take 1 tablet (300 mg total) by mouth daily.  01/26/21  Yes Orson Slick, MD  amiodarone (PACERONE) 200 MG tablet TAKE 1 TABLET BY MOUTH  DAILY Patient taking differently: Take 200 mg by mouth daily. 01/18/21  Yes Croitoru, Mihai, MD  carvedilol (COREG) 6.25 MG tablet TAKE 1 TABLET BY MOUTH  TWICE DAILY WITH A MEAL Patient taking differently: Take 6.25 mg by mouth 2 (two) times daily with a meal. 02/26/20  Yes Croitoru, Mihai, MD  citalopram (CELEXA) 20 MG tablet Take 20 mg by mouth daily.   Yes [provider]  glimepiride (AMARYL) 1 MG tablet Take 1 mg by mouth daily with breakfast. 05/26/17  Yes [provider]  isosorbide mononitrate (IMDUR) 60 MG 24 hr tablet Take 60 mg in the morning and 30 mg (half a tablet) in the evening. Patient taking differently: Take 30-60 mg by mouth See admin instructions. Take 60 mg in the morning and 30 mg (half a tablet) in the evening. 09/03/20  Yes Croitoru, Mihai, MD  levothyroxine (SYNTHROID) 125 MCG tablet Take 125 mcg by mouth daily. 01/19/21  Yes [provider]  lidocaine-prilocaine (EMLA) cream Apply 1 application topically as needed. Patient taking differently: Apply 1 application topically daily as needed (port access). 01/26/21  Yes Orson Slick, MD  Multiple Vitamin (MULTIVITAMIN) tablet Take 1 tablet by mouth daily.   Yes [provider]  nitroGLYCERIN (NITROSTAT) 0.4 MG SL tablet Place 1 tablet (0.4 mg total) under the tongue every 5 (five) minutes as needed for chest pain. 09/03/20  Yes Croitoru, Mihai, MD  predniSONE (DELTASONE) 20 MG tablet Take 3 tablets (60 mg total) by mouth as directed. Take 60 mg  ( 3 tablets) by mouth every morning on Days 1 to 5 of chemotherapy. 01/26/21  Yes Orson Slick, MD  repaglinide (PRANDIN) 2 MG tablet Take 2 mg by mouth 2 (two) times daily before a meal.   Yes [provider]  rosuvastatin (CRESTOR) 40 MG tablet Take 40 mg by mouth daily.   Yes Gaynelle Arabian, MD  vitamin C (ASCORBIC ACID) 500 MG tablet  Take 500 mg by mouth daily.   Yes [provider]  ondansetron (ZOFRAN) 8 MG tablet Take 1 tablet (8 mg total) by mouth every 8 (eight) hours as needed for nausea or vomiting. 01/26/21   Orson Slick, MD  [facility] ULTRA test strip USE 1 STRIP TO CHECK GLUCOSE AS DIRECTED 01/14/21   [provider]  prochlorperazine (COMPAZINE) 10 MG tablet TAKE 1 TABLET BY MOUTH EVERY 6 HOURS AS NEEDED FOR NAUSEA OR  VOMITING Patient taking differently: Take 10 mg by mouth every 8 (eight) hours as needed for nausea or vomiting. 02/11/21   Orson Slick, MD    Physical Exam: Vitals:   03/03/21 2000 03/03/21 2245 03/03/21 2300 03/04/21 0020  BP: (!) 105/57 (!) 110/59 112/62   Pulse: (!) 59 (!) 59 (!) 59   Resp: 13 14 15    Temp:  98.2 F (36.8 C) 98.1 F (36.7 C)  TempSrc:   Oral Oral  SpO2: 98% 99% 99%   Weight:    86.6 kg  Height:    5\' 11"  (1.803 m)    Constitutional: NAD, calm, comfortable, nontoxic appearing elderly male laying flat in bed mostly with eyes closed Vitals:   03/03/21 2000 03/03/21 2245 03/03/21 2300 03/04/21 0020  BP: (!) 105/57 (!) 110/59 112/62   Pulse: (!) 59 (!) 59 (!) 59   Resp: 13 14 15    Temp:   98.2 F (36.8 C) 98.1 F (36.7 C)  TempSrc:   Oral Oral  SpO2: 98% 99% 99%   Weight:    86.6 kg  Height:    5\' 11"  (1.803 m)   Eyes: PERRL, lids and conjunctivae normal ENMT: Mucous membranes are moist.  Neck: normal, supple Respiratory: clear to auscultation bilaterally, no wheezing, no crackles. Normal respiratory effort. No accessory muscle use.  Cardiovascular: Regular rate and rhythm, no murmurs / rubs / gallops. No extremity edema. 2+ pedal pulses. No carotid bruits.  Abdomen: no tenderness, Bowel sounds positive. Large 3 inch in diameter raised greyish black heaped up tumor to LLQ covered with clean bandage Musculoskeletal: no clubbing / cyanosis. No joint deformity upper and lower extremities. Good ROM, no contractures. Normal muscle tone.   Skin: see abdominal exam  Neurologic: CN 2-12 grossly intact. Sensation intact, . Strength 5/5 in all 4.  Psychiatric: Normal judgment and insight. Alert and oriented x 3. Normal mood.    Labs on Admission: I have personally reviewed following labs and imaging studies  CBC: Recent Labs  Lab 03/03/21 1242 03/03/21 1443  WBC 1.4* 1.5*  NEUTROABS 0.9* 0.8*  HGB 9.2* 8.5*  HCT 27.3* 25.5*  MCV 98.9 99.2  PLT 132* 443*   Basic Metabolic Panel: Recent Labs  Lab 03/03/21 1242 03/03/21 1443 03/03/21 2055 03/03/21 2330  NA 127* 126* 130* 131*  K 4.2 3.7 3.6 3.6  CL 88* 90* 94* 96*  CO2 29 28 29 30   GLUCOSE 657* 572* 356* 160*  BUN 41* 43* 42* 38*  CREATININE 2.27* 1.76* 1.81* 1.59*  CALCIUM 8.8* 8.5* 8.4* 8.3*   GFR: Estimated Creatinine Clearance: 38.8 mL/min (A) (by C-G formula based on SCr of 1.59 mg/dL (H)). Liver Function Tests: Recent Labs  Lab 03/03/21 1242 03/03/21 1443  AST 32 31  ALT 77* 68*  ALKPHOS 145* 98  BILITOT 0.5 0.6  PROT 6.0* 5.5*  ALBUMIN 3.0* 3.0*   No results for input(s): LIPASE, AMYLASE in the last 168 hours. No results for input(s): AMMONIA in the last 168 hours. Coagulation Profile: No results for input(s): INR, PROTIME in the last 168 hours. Cardiac Enzymes: No results for input(s): CKTOTAL, CKMB, CKMBINDEX, TROPONINI in the last 168 hours. BNP (last 3 results) No results for input(s): PROBNP in the last 8760 hours. HbA1C: No results for input(s): HGBA1C in the last 72 hours. CBG: Recent Labs  Lab 03/03/21 2043 03/03/21 2201 03/03/21 2258 03/04/21 0019 03/04/21 0120  GLUCAP 344* 237* 181* 141* 134*   Lipid Profile: No results for input(s): CHOL, HDL, LDLCALC, TRIG, CHOLHDL, LDLDIRECT in the last 72 hours. Thyroid Function Tests: No results for input(s): TSH, T4TOTAL, FREET4, T3FREE, THYROIDAB in the last 72 hours. Anemia Panel: No results for input(s): VITAMINB12, FOLATE, FERRITIN, TIBC, IRON, RETICCTPCT in the last 72  hours. Urine analysis:    Component Value Date/Time   COLORURINE YELLOW 02/05/2021 [place] 02/05/2021 1730  LABSPEC 1.011 02/05/2021 1730   PHURINE 5.0 02/05/2021 1730   GLUCOSEU >=500 (A) 02/05/2021 1730   HGBUR MODERATE (A) 02/05/2021 1730   BILIRUBINUR NEGATIVE 02/05/2021 1730   KETONESUR NEGATIVE 02/05/2021 1730   PROTEINUR 30 (A) 02/05/2021 1730   NITRITE NEGATIVE 02/05/2021 1730   LEUKOCYTESUR NEGATIVE 02/05/2021 1730    Radiological Exams on Admission: No results found.    Assessment/Plan  Hyperglycemia with hx of Type 2 diabetes and recent steroid use during chemotherapy -BG of 572. Initially given 15u of insulin and started on insulin infusion. BG has now improved with 130s. Transition off endotool and place on moderate SSI -continue to check CBG q4hr overnight   Hypotension -likely due to decrease PO intake and hyperglycemia -has improved with fluid resuscitation  Pancytopenia -Likely induced by chemotherapy  Chronic systolic heart failure - EF of 40-45 on echo in 2018.  Hypovolemic.  Monitor closely with increased IV fluid resuscitation -hold Coreg and Imdur for now due to hypotension  CKD stage IIIb -Creatinine stable  VF s/p ICD -Noted by nursing to have some ST elevation on telemetry.  Will replete K up to 4, Mg to 2.   Advanced B-cell lymphoma -follows with Dr. Lorenso Courier  -on chemotherapy -continue allopurinol for TLS prophylaxis  hx of atrial fibrillation -continue amiodarone   Hyperlipidemia - Continue statin  Hypothyroidism -continue levothyroxine  DVT prophylaxis:.heparin SQ Code Status: Full Family Communication: Plan discussed with patient and wife at bedside  disposition Plan: Home with observation Consults called:  Admission status: Observation  Level of care: Stepdown  Status is: Observation  The patient remains OBS appropriate and will d/c before 2 midnights.  Dispo: The patient is from: Home               Anticipated d/c is to: Home              Patient currently is not medically stable to d/c.   Difficult to place patient No         Orene Desanctis DO [facility]   If 7PM-7AM, please contact night-coverage [email or web address]   03/04/2021, 1:38 AM

## 2021-03-04 NOTE — Progress Notes (Signed)
Patient transferred via stretcher by ED RN into room 1222 via stretcher. Patient alert and oriented x 4. Assessment to follow

## 2021-03-04 NOTE — Progress Notes (Addendum)
Nutrition Brief Note  Patient identified on the Malnutrition Screening Tool (MST) Report  Wt Readings from Last 15 Encounters:  03/04/21 86.6 kg  02/24/21 86.5 kg  02/03/21 88.3 kg  01/25/21 87 kg  01/07/21 89.8 kg  09/03/20 87.5 kg  08/16/19 90.6 kg  06/18/18 90.1 kg  02/13/18 89.9 kg  01/16/18 90.3 kg  12/18/17 90.3 kg  06/07/17 83.4 kg  03/28/17 84.1 kg  03/04/17 86 kg  10/14/16 88.5 kg    Body mass index is 26.63 kg/m. Patient meets criteria for overweight status based on current BMI. Weight today is 191 lb and weight on 6/1 was 194 lb. This indicates 1.5% body weight in 1 month; not significant for time frame.  Current diet order is Carb Modified and he consumed 100% of breakfast and 100% of lunch today (total 932 kcal and 42 grams protein).  Patient is Observation status. He has hx of advanced B-cell lymphoma on chemotherapy. He is not followed by RD at the [facility].  Labs and medications reviewed.   No nutrition interventions warranted at this time. If nutrition issues arise, please consult RD.      Jarome Matin, MS, RD, LDN, CNSC Inpatient Clinical Dietitian RD pager # available in [place]  After hours/weekend pager # available in [facility]

## 2021-03-05 LAB — HEMOGLOBIN A1C
Hgb A1c MFr Bld: 8.9 % — ABNORMAL HIGH (ref 4.8–5.6)
Mean Plasma Glucose: 209 mg/dL

## 2021-03-08 LAB — CUP PACEART REMOTE DEVICE CHECK
Battery Remaining Longevity: 33 mo
Battery Voltage: 2.95 V
Brady Statistic AP VP Percent: 0.06 %
Brady Statistic AP VS Percent: 94.67 %
Brady Statistic AS VP Percent: 0 %
Brady Statistic AS VS Percent: 5.27 %
Brady Statistic RA Percent Paced: 93.85 %
Brady Statistic RV Percent Paced: 0.07 %
Date Time Interrogation Session: 20220701001704
HighPow Impedance: 39 Ohm
HighPow Impedance: 48 Ohm
Implantable Lead Implant Date: 20090908
Implantable Lead Implant Date: 20090908
Implantable Lead Location: 753859
Implantable Lead Location: 753860
Implantable Lead Model: 5076
Implantable Lead Model: 6947
Implantable Pulse Generator Implant Date: 20170123
Lead Channel Impedance Value: 399 Ohm
Lead Channel Impedance Value: 494 Ohm
Lead Channel Impedance Value: 532 Ohm
Lead Channel Pacing Threshold Amplitude: 0.625 V
Lead Channel Pacing Threshold Amplitude: 0.875 V
Lead Channel Pacing Threshold Pulse Width: 0.4 ms
Lead Channel Pacing Threshold Pulse Width: 0.4 ms
Lead Channel Sensing Intrinsic Amplitude: 15 mV
Lead Channel Sensing Intrinsic Amplitude: 15 mV
Lead Channel Sensing Intrinsic Amplitude: 5 mV
Lead Channel Sensing Intrinsic Amplitude: 5 mV
Lead Channel Setting Pacing Amplitude: 2 V
Lead Channel Setting Pacing Amplitude: 2.5 V
Lead Channel Setting Pacing Pulse Width: 0.4 ms
Lead Channel Setting Sensing Sensitivity: 0.3 mV

## 2021-03-08 LAB — CULTURE, BLOOD (ROUTINE X 2)
Culture: NO GROWTH
Culture: NO GROWTH
Special Requests: ADEQUATE

## 2021-03-09 ENCOUNTER — Other Ambulatory Visit: Payer: Self-pay

## 2021-03-09 ENCOUNTER — Encounter (HOSPITAL_BASED_OUTPATIENT_CLINIC_OR_DEPARTMENT_OTHER): Payer: Medicare HMO | Attending: Internal Medicine | Admitting: Internal Medicine

## 2021-03-09 ENCOUNTER — Encounter: Payer: Self-pay | Admitting: Hematology and Oncology

## 2021-03-09 DIAGNOSIS — I110 Hypertensive heart disease with heart failure: Secondary | ICD-10-CM | POA: Diagnosis not present

## 2021-03-09 DIAGNOSIS — I429 Cardiomyopathy, unspecified: Secondary | ICD-10-CM | POA: Diagnosis not present

## 2021-03-09 DIAGNOSIS — L89152 Pressure ulcer of sacral region, stage 2: Secondary | ICD-10-CM | POA: Diagnosis not present

## 2021-03-09 DIAGNOSIS — I509 Heart failure, unspecified: Secondary | ICD-10-CM | POA: Diagnosis not present

## 2021-03-09 DIAGNOSIS — E11622 Type 2 diabetes mellitus with other skin ulcer: Secondary | ICD-10-CM | POA: Diagnosis not present

## 2021-03-09 NOTE — Progress Notes (Signed)
Rodney Lee ([id number]) , Visit Report for 03/09/2021 Fall Risk Assessment Details Patient Name: Date of Service: SCO [name], [name] 03/09/2021 2:00 PM Medical Record Number: [id number] Patient Account Number: [id number] Date of Birth/Sex: Treating RN: Sep 12, 1940 (80 y.o. Gilman Buttner Primary Care Rosetta Rupnow: Simona Huh Other Clinician: Referring Devanie Galanti: Treating Ameli Sangiovanni/Extender: Joseph Berkshire in Treatment: 2 Fall Risk Assessment Items Have you had 2 or more falls in the last 12 monthso 0 Yes Have you had any fall that resulted in injury in the last 12 monthso 0 No FALLS RISK SCREEN History of falling - immediate or within 3 months 25 Yes Secondary diagnosis (Do you have 2 or more medical diagnoseso) 15 Yes Ambulatory aid None/bed rest/wheelchair/nurse 0 Yes Crutches/cane/walker 0 No Furniture 0 No Intravenous therapy Access/Saline/Heparin Lock 0 No Gait/Transferring Normal/ bed rest/ wheelchair 0 No Weak (short steps with or without shuffle, stooped but able to lift head while walking, may seek 10 Yes support from furniture) Impaired (short steps with shuffle, may have difficulty arising from chair, head down, impaired 0 No balance) Mental Status Oriented to own ability 0 No Notes fell this week per spouse Electronic Signature(s) Signed: 03/09/2021 6:17:43 PM By: Leane Call Entered By: Leane Call on 03/09/2021 15:23:15

## 2021-03-10 ENCOUNTER — Inpatient Hospital Stay: Payer: Medicare HMO | Attending: Hematology and Oncology

## 2021-03-10 DIAGNOSIS — Z5112 Encounter for antineoplastic immunotherapy: Secondary | ICD-10-CM | POA: Insufficient documentation

## 2021-03-10 DIAGNOSIS — L89152 Pressure ulcer of sacral region, stage 2: Secondary | ICD-10-CM | POA: Diagnosis not present

## 2021-03-10 DIAGNOSIS — C8510 Unspecified B-cell lymphoma, unspecified site: Secondary | ICD-10-CM | POA: Diagnosis not present

## 2021-03-10 DIAGNOSIS — D6481 Anemia due to antineoplastic chemotherapy: Secondary | ICD-10-CM | POA: Diagnosis not present

## 2021-03-10 DIAGNOSIS — Z5111 Encounter for antineoplastic chemotherapy: Secondary | ICD-10-CM | POA: Diagnosis not present

## 2021-03-10 DIAGNOSIS — Z95828 Presence of other vascular implants and grafts: Secondary | ICD-10-CM

## 2021-03-10 DIAGNOSIS — S31809A Unspecified open wound of unspecified buttock, initial encounter: Secondary | ICD-10-CM | POA: Diagnosis not present

## 2021-03-10 LAB — CMP (CANCER CENTER ONLY)
ALT: 23 U/L (ref 0–44)
AST: 19 U/L (ref 15–41)
Albumin: 2.7 g/dL — ABNORMAL LOW (ref 3.5–5.0)
Alkaline Phosphatase: 78 U/L (ref 38–126)
Anion gap: 4 — ABNORMAL LOW (ref 5–15)
BUN: 26 mg/dL — ABNORMAL HIGH (ref 8–23)
CO2: 28 mmol/L (ref 22–32)
Calcium: 8.2 mg/dL — ABNORMAL LOW (ref 8.9–10.3)
Chloride: 102 mmol/L (ref 98–111)
Creatinine: 1.93 mg/dL — ABNORMAL HIGH (ref 0.61–1.24)
GFR, Estimated: 34 mL/min — ABNORMAL LOW (ref >60)
Glucose, Bld: 263 mg/dL — ABNORMAL HIGH (ref 70–99)
Potassium: 5 mmol/L (ref 3.5–5.1)
Sodium: 134 mmol/L — ABNORMAL LOW (ref 135–145)
Total Bilirubin: 0.5 mg/dL (ref 0.3–1.2)
Total Protein: 5.7 g/dL — ABNORMAL LOW (ref 6.5–8.1)

## 2021-03-10 LAB — CBC WITH DIFFERENTIAL (CANCER CENTER ONLY)
Abs Immature Granulocytes: 0.11 10*3/uL — ABNORMAL HIGH (ref 0.00–0.07)
Basophils Absolute: 0 10*3/uL (ref 0.0–0.1)
Basophils Relative: 2 %
Eosinophils Absolute: 0 10*3/uL (ref 0.0–0.5)
Eosinophils Relative: 2 %
HCT: 24.3 % — ABNORMAL LOW (ref 39.0–52.0)
Hemoglobin: 7.9 g/dL — ABNORMAL LOW (ref 13.0–17.0)
Immature Granulocytes: 6 %
Lymphocytes Relative: 17 %
Lymphs Abs: 0.3 10*3/uL — ABNORMAL LOW (ref 0.7–4.0)
MCH: 33.3 pg (ref 26.0–34.0)
MCHC: 32.5 g/dL (ref 30.0–36.0)
MCV: 102.5 fL — ABNORMAL HIGH (ref 80.0–100.0)
Monocytes Absolute: 1 10*3/uL (ref 0.1–1.0)
Monocytes Relative: 48 %
Neutro Abs: 0.5 10*3/uL — ABNORMAL LOW (ref 1.7–7.7)
Neutrophils Relative %: 25 %
Platelet Count: 32 10*3/uL — ABNORMAL LOW (ref 150–400)
RBC: 2.37 MIL/uL — ABNORMAL LOW (ref 4.22–5.81)
RDW: 18.9 % — ABNORMAL HIGH (ref 11.5–15.5)
WBC Count: 2 10*3/uL — ABNORMAL LOW (ref 4.0–10.5)
nRBC: 0 % (ref 0.0–0.2)

## 2021-03-10 LAB — URIC ACID: Uric Acid, Serum: <2 mg/dL — ABNORMAL LOW (ref 3.7–8.6)

## 2021-03-10 LAB — LACTATE DEHYDROGENASE: LDH: 163 U/L (ref 98–192)

## 2021-03-10 MED ORDER — SODIUM CHLORIDE 0.9% FLUSH
10.0000 mL | Freq: Once | INTRAVENOUS | Status: AC
  Administered 2021-03-10: 10 mL
  Filled 2021-03-10: qty 10

## 2021-03-10 MED ORDER — HEPARIN SOD (PORK) LOCK FLUSH 100 UNIT/ML IV SOLN
500.0000 [IU] | Freq: Once | INTRAVENOUS | Status: AC
  Administered 2021-03-10: 500 [IU]
  Filled 2021-03-10: qty 5

## 2021-03-10 NOTE — Progress Notes (Addendum)
Rodney Lee ([id number]) , Visit Report for 03/09/2021 Arrival Information Details Patient Name: Date of Service: Rodney Lee, Rodney Lee 03/09/2021 2:00 PM Medical Record Number: [id number] Patient Account Number: [id number] Date of Birth/Sex: Treating RN: May 23, 1941 (80 y.o. Rodney Lee Primary Care Kristoph Sattler: Simona Huh Other Clinician: Referring Thandiwe Siragusa: Treating Branden Vine/Extender: Joseph Berkshire in Treatment: 2 Visit Information History Since Last Visit All ordered tests and consults were completed: No Patient Arrived: Ambulatory Added or deleted any medications: No Arrival Time: 15:10 Any new allergies or adverse reactions: No Accompanied By: spouse Had a fall or experienced change in No Transfer Assistance: None activities of daily living that may affect Patient Identification Verified: Yes risk of falls: Secondary Verification Process Completed: Yes Signs or symptoms of abuse/neglect since last visito No Patient Requires Transmission-Based Precautions: No Hospitalized since last visit: No Patient Has Alerts: No Implantable device outside of the clinic excluding No cellular tissue based products placed in the center since last visit: Has Dressing in Place as Prescribed: Yes Pain Present Now: No Electronic Signature(s) Signed: 03/09/2021 6:17:43 PM By: Leane Call Entered By: Leane Call on 03/09/2021 15:21:06 -------------------------------------------------------------------------------- Encounter Discharge Information Details Patient Name: Date of Service: Rodney Lee. 03/09/2021 2:00 PM Medical Record Number: [id number] Patient Account Number: [id number] Date of Birth/Sex: Treating RN: 1941-03-04 (79 y.o. Rodney Lee Primary Care Kippy Melena: Simona Huh Other Clinician: Referring Aleysha Meckler: Treating Karrina Lye/Extender: Joseph Berkshire in Treatment: 2 Encounter Discharge Information  Items Post Procedure Vitals Discharge Condition: Stable Temperature (F): 98.6 Ambulatory Status: Ambulatory Pulse (bpm): 89 Discharge Destination: Home Respiratory Rate (breaths/min): 18 Transportation: Private Auto Blood Pressure (mmHg): 89/52 Accompanied By: spouse Schedule Follow-up Appointment: No Clinical Summary of Care: Provided on 03/09/2021 Form Type Recipient Paper Patient Patient Electronic Signature(s) Signed: 03/09/2021 6:18:15 PM By: Lorrin Jackson Previous Signature: 03/09/2021 6:17:43 PM Version By: Leane Call Entered By: Lorrin Jackson on 03/09/2021 18:18:15 -------------------------------------------------------------------------------- Lower Extremity Assessment Details Patient Name: Date of Service: Rodney Lee. 03/09/2021 2:00 PM Medical Record Number: [id number] Patient Account Number: [id number] Date of Birth/Sex: Treating RN: [birth year] (79 y.o. Rodney Lee Primary Care Adolphe Fortunato: Simona Huh Other Clinician: Referring Dejanae Helser: Treating Casy Brunetto/Extender: Joseph Berkshire in Treatment: 2 Electronic Signature(s) Signed: 03/09/2021 6:17:43 PM By: Leane Call Entered By: Leane Call on 03/09/2021 15:21:50 -------------------------------------------------------------------------------- Multi Wound Chart Details Patient Name: Date of Service: Rodney Lee. 03/09/2021 2:00 PM Medical Record Number: [id number] Patient Account Number: [id number] Date of Birth/Sex: Treating RN: 15-Mar-1941 (80 y.o. Rodney Lee Primary Care Xayne Brumbaugh: Simona Huh Other Clinician: Referring Bobak Oguinn: Treating Jozie Wulf/Extender: Joseph Berkshire in Treatment: 2 Vital Signs Height(in): 32 Pulse(bpm): 41 Weight(lbs): 196 Blood Pressure(mmHg): 89/52 Body Mass Index(BMI): 29 Temperature(F): 98.6 Respiratory Rate(breaths/min): 18 Photos: [1:No Photos Sacrum] [N/A:N/A N/A] Wound Location:  [1:Pressure Injury] [N/A:N/A] Wounding Event: [1:Pressure Ulcer] [N/A:N/A] Primary Etiology: [1:Anemia, Arrhythmia, Congestive Heart N/A] Comorbid History: [1:Failure, Coronary Artery Disease, Hypertension, Myocardial Infarction, Type II Diabetes, Received Chemotherapy 02/15/2021] [N/A:N/A] Date Acquired: [1:2] [N/A:N/A] Weeks of Treatment: [1:Open] [N/A:N/A] Wound Status: [1:0.5x0.8x0.1] [N/A:N/A] Measurements L x W x D (cm) [1:0.314] [N/A:N/A] A (cm) : rea [1:0.031] [N/A:N/A] Volume (cm) : [1:76.20%] [N/A:N/A] % Reduction in A rea: [1:76.50%] [N/A:N/A] % Reduction in Volume: [1:Category/Stage II] [N/A:N/A] Classification: [1:Medium] [N/A:N/A] Exudate A mount: [1:Serosanguineous] [N/A:N/A] Exudate Type: [1:red, brown] [N/A:N/A] Exudate Color: [1:Distinct, outline attached] [N/A:N/A] Wound Margin: [1:None Present (0%)] [N/A:N/A] Granulation A mount: [  1:Large (67-100%)] [N/A:N/A] Necrotic A mount: [1:Fat Layer (Subcutaneous Tissue): Yes N/A] Exposed Structures: [1:Fascia: No Tendon: No Muscle: No Joint: No Bone: No Small (1-33%)] [N/A:N/A] Epithelialization: [1:Debridement - Excisional] [N/A:N/A] Debridement: [1:15:45] [N/A:N/A] Pre-procedure Verification/Time Out Taken: [1:Lidocaine 5% topical ointment] [N/A:N/A] Pain Control: [1:Subcutaneous] [N/A:N/A] Tissue Debrided: [1:Skin/Subcutaneous Tissue] [N/A:N/A] Level: [1:0.4] [N/A:N/A] Debridement A (sq cm): [1:rea Curette] [N/A:N/A] Instrument: [1:Minimum] [N/A:N/A] Bleeding: [1:Pressure] [N/A:N/A] Hemostasis A chieved: [1:Procedure was tolerated well] [N/A:N/A] Debridement Treatment Response: [1:0.5x0.8x0.1] [N/A:N/A] Post Debridement Measurements L x W x D (cm) [1:0.031] [N/A:N/A] Post Debridement Volume: (cm) [1:Category/Stage II] [N/A:N/A] Post Debridement Stage: [1:drier slough to the wound bed] [N/A:N/A] Assessment Notes: [1:Debridement] [N/A:N/A] Treatment Notes Electronic Signature(s) Signed: 03/09/2021 7:08:24 PM By:  Lorrin Jackson Signed: 03/10/2021 1:36:13 PM By: Linton Ham MD Entered By: Linton Ham on 03/09/2021 16:00:19 -------------------------------------------------------------------------------- Pain Assessment Details Patient Name: Date of Service: Rodney Lee. 03/09/2021 2:00 PM Medical Record Number: [id number] Patient Account Number: [id number] Date of Birth/Sex: Treating RN: 10-15-1940 (81 y.o. Rodney Lee Primary Care Momin Misko: Simona Huh Other Clinician: Referring Johanna Stafford: Treating Talin Feister/Extender: Joseph Berkshire in Treatment: 2 Active Problems Location of Pain Severity and Description of Pain Patient Has Paino No Site Locations Rate the pain. Current Pain Level: 0 Pain Management and Medication Current Pain Management: Electronic Signature(s) Signed: 03/09/2021 6:17:43 PM By: Leane Call Entered By: Leane Call on 03/09/2021 15:21:43 -------------------------------------------------------------------------------- Patient/Caregiver Education Details Patient Name: Date of Service: Rodney Lee 7/5/2022andnbsp2:00 PM Medical Record Number: [id number] Patient Account Number: [id number] Date of Birth/Gender: Treating RN: Jul 06, 1941 (80 y.o. Rodney Lee Primary Care Physician: Simona Huh Other Clinician: Referring Physician: Treating Physician/Extender: Joseph Berkshire in Treatment: 2 Education Assessment Education Provided To: Patient and Caregiver Education Topics Provided Offloading: Methods: Explain/Verbal, Printed Responses: State content correctly Wound/Skin Impairment: Methods: Demonstration, Explain/Verbal, Printed Responses: State content correctly Electronic Signature(s) Signed: 03/09/2021 7:08:24 PM By: Lorrin Jackson Entered By: Lorrin Jackson on 03/09/2021 15:55:48 -------------------------------------------------------------------------------- Wound  Assessment Details Patient Name: Date of Service: Rodney Lee. 03/09/2021 2:00 PM Medical Record Number: [id number] Patient Account Number: [id number] Date of Birth/Sex: Treating RN: [birth year] (79 y.o. Rodney Lee Primary Care [place] Grosser: Simona Huh Other Clinician: Referring Khalib Fendley: Treating Jerris Keltz/Extender: Joseph Berkshire in Treatment: 2 Wound Status Wound Number: 1 Primary Pressure Ulcer Etiology: Wound Location: Sacrum Wound Open Wounding Event: Pressure Injury Status: Date Acquired: 02/15/2021 Comorbid Anemia, Arrhythmia, Congestive Heart Failure, Coronary Artery Weeks Of Treatment: 2 History: Disease, Hypertension, Myocardial Infarction, Type II Diabetes, Clustered Wound: No Received Chemotherapy Photos Wound Measurements Length: (cm) 0.5 Width: (cm) 0.8 Depth: (cm) 0.1 Area: (cm) 0.314 Volume: (cm) 0.031 % Reduction in Area: 76.2% % Reduction in Volume: 76.5% Epithelialization: Small (1-33%) Tunneling: No Undermining: No Wound Description Classification: Category/Stage II Wound Margin: Distinct, outline attached Exudate Amount: Medium Exudate Type: Serosanguineous Exudate Color: red, brown Foul Odor After Cleansing: No Slough/Fibrino No Wound Bed Granulation Amount: None Present (0%) Exposed Structure Necrotic Amount: Large (67-100%) Fascia Exposed: No Necrotic Quality: Adherent Slough Fat Layer (Subcutaneous Tissue) Exposed: Yes Tendon Exposed: No Muscle Exposed: No Joint Exposed: No Bone Exposed: No Assessment Notes drier slough to the wound bed Treatment Notes Wound #1 (Sacrum) Cleanser Soap and Water Discharge Instruction: May shower and wash wound with dial antibacterial soap and water prior to dressing change. Wound Cleanser Discharge Instruction: Cleanse the wound with wound cleanser prior to applying a clean dressing using gauze sponges,  not tissue or cotton balls. Peri-Wound Care Skin  Prep Discharge Instruction: Use skin prep as directed Topical Primary Dressing Hydrofera Blue Ready Foam, 2.5 x2.5 in Discharge Instruction: Apply to wound bed as instructed Secondary Dressing Woven Gauze Sponge, Non-Sterile 4x4 in Discharge Instruction: Apply over primary dressing as directed. Bordered Gauze, 4x4 in Discharge Instruction: Apply over primary dressing as directed. Secured With Compression Wrap Compression Stockings Environmental education officer) Signed: 03/10/2021 5:07:06 PM By: Leane Call Signed: 03/10/2021 5:22:20 PM By: Sandre Kitty Previous Signature: 03/09/2021 6:17:43 PM Version By: Leane Call Entered By: Sandre Kitty on 03/10/2021 16:32:24 -------------------------------------------------------------------------------- Vitals Details Patient Name: Date of Service: Rodney TT, Sebastin N. 03/09/2021 2:00 PM Medical Record Number: [id number] Patient Account Number: [id number] Date of Birth/Sex: Treating RN: 1941/03/17 (80 y.o. Rodney Lee Primary Care Peter Daquila: Simona Huh Other Clinician: Referring Talayia Hjort: Treating Yaslin Kirtley/Extender: Joseph Berkshire in Treatment: 2 Vital Signs Time Taken: 15:15 Temperature (F): 98.6 Height (in): 69 Pulse (bpm): 81 Weight (lbs): 196 Respiratory Rate (breaths/min): 18 Body Mass Index (BMI): 28.9 Blood Pressure (mmHg): 89/52 Reference Range: 80 - 120 mg / dl Airway Pulse Oximetry (%): 90 Electronic Signature(s) Signed: 03/09/2021 6:17:43 PM By: Leane Call Entered By: Leane Call on 03/09/2021 15:21:31

## 2021-03-10 NOTE — Progress Notes (Signed)
Rodney Lee ([id number]) , Visit Report for 03/09/2021 Debridement Details Patient Name: Date of Service: SCO Lee, Rodney 03/09/2021 2:00 PM Medical Record Number: [id number] Patient Account Number: [id number] Date of Birth/Sex: Treating RN: 1941-05-22 (80 y.o. Rodney Lee Primary Care Provider: Simona Huh Other Clinician: Referring Provider: Treating Provider/Extender: Joseph Berkshire in Treatment: 2 Debridement Performed for Assessment: Wound #1 Sacrum Performed By: Physician Ricard Dillon., MD Debridement Type: Debridement Level of Consciousness (Pre-procedure): Awake and Alert Pre-procedure Verification/Time Out Yes - 15:45 Taken: Start Time: 15:46 Pain Control: Lidocaine 5% topical ointment T Area Debrided (L x W): otal 0.5 (cm) x 0.8 (cm) = 0.4 (cm) Tissue and other material debrided: Non-Viable, Eschar, Skin: Epidermis Level: Skin/Epidermis Debridement Description: Selective/Open Wound Instrument: Curette Bleeding: Minimum Hemostasis Achieved: Pressure End Time: 15:50 Response to Treatment: Procedure was tolerated well Level of Consciousness (Post- Awake and Alert procedure): Post Debridement Measurements of Total Wound Length: (cm) 0.5 Stage: Category/Stage II Width: (cm) 0.8 Depth: (cm) 0.1 Volume: (cm) 0.031 Character of Wound/Ulcer Post Debridement: Stable Post Procedure Diagnosis Same as Pre-procedure Electronic Signature(s) Signed: 03/09/2021 7:08:24 PM By: Lorrin Jackson Signed: 03/10/2021 1:36:13 PM By: Linton Ham MD Entered By: Linton Ham on 03/09/2021 16:00:36 -------------------------------------------------------------------------------- HPI Details Patient Name: Date of Service: Rodney Lee. 03/09/2021 2:00 PM Medical Record Number: [id number] Patient Account Number: [id number] Date of Birth/Sex: Treating RN: 08-28-1941 (80 y.o. Rodney Lee Primary Care Provider: Simona Huh Other  Clinician: Referring Provider: Treating Provider/Extender: Joseph Berkshire in Treatment: 2 History of Present Illness HPI Description: ADMISSION 02/23/2021 This is a pleasant 80 year old man who arrived with his wife. He has been diagnosed this year with high-grade B-cell lymphoma stage III/IV. CT scan of the abdomen pelvis showed a soft tissue mass at 6.7 cm. Punch biopsy was consistent with a high-grade B-cell lymphoma. He is starting chemotherapy. His oncologist is Dr. Lorenso Courier. PET scan showed diffuse disease along the anterior abdominal wall pulmonary nodule lesions along the cortex of the left and right kidney. He is being treated with RGCVP. The patient feels generally weak. He spends most of his day in a recliner. His next chemotherapy is this Thursday and he will be back in a recliner. Noted by home health to have a wound on the lower sacrum/coccyx just to the right of the midline. They have been applying Vaseline or Vaseline gauze I did look at his wound on his abdomen which simply had an ABD pad over it. This is a malignant wound by imaging and biopsy. A large necrotic area. Seems to have minimal drainage however. No evidence of infection around this. Past medical history includes B-cell lymphoma as described, cardiomyopathy, CHF, history of V. tach 6/28 small area on the right buttock in close proximity to the sacrum. I did not look at his abdominal wound today. 7/5; right buttock in close proximity to the sacrum. Dry flaking skin over the surface which I removed with a #3 curette this is just about epithelialized Electronic Signature(s) Signed: 03/10/2021 1:36:13 PM By: Linton Ham MD Entered By: Linton Ham on 03/09/2021 16:01:12 -------------------------------------------------------------------------------- Physical Exam Details Patient Name: Date of Service: Rodney Lee 03/09/2021 2:00 PM Medical Record Number: [id number] Patient Account  Number: [id number] Date of Birth/Sex: Treating RN: 1941/03/15 (80 y.o. Rodney Lee Primary Care Provider: Simona Huh Other Clinician: Referring Provider: Treating Provider/Extender: Joseph Berkshire in Treatment: 2 Constitutional Patient is hypotensive.Marland Kitchen  Respiratory work of breathing is normal. Notes Wound exam; the area in question at the level of the coccyx just to the right of the midline within the gluteal cleft. I removed dry flaking skin from the surface with a #3 curette this looks as though it should close Electronic Signature(s) Signed: 03/10/2021 1:36:13 PM By: Linton Ham MD Entered By: Linton Ham on 03/09/2021 16:01:55 -------------------------------------------------------------------------------- Physician Orders Details Patient Name: Date of Service: Rodney Lee. 03/09/2021 2:00 PM Medical Record Number: [id number] Patient Account Number: [id number] Date of Birth/Sex: Treating RN: December 21, 1940 (80 y.o. Rodney Lee Primary Care Provider: Simona Huh Other Clinician: Referring Provider: Treating Provider/Extender: Joseph Berkshire in Treatment: 2 Verbal / Phone Orders: No Diagnosis Coding ICD-10 Coding Code Description [phone] Pressure ulcer of sacral region, stage 2 C85.10 Unspecified B-cell lymphoma, unspecified site Follow-up Appointments ppointment in 1 week. - Dr. Dellia Nims Return A Bathing/ Shower/ Hygiene May shower with protection but do not get wound dressing(s) wet. Off-Loading Roho cushion for wheelchair - or for any chair when sitting Turn and reposition every 2 hours Wound Treatment Wound #1 - Sacrum Cleanser: Soap and Water Every Other Day/15 Days Discharge Instructions: May shower and wash wound with dial antibacterial soap and water prior to dressing change. Cleanser: Wound Cleanser (Generic) Every Other Day/15 Days Discharge Instructions: Cleanse the wound with wound  cleanser prior to applying a clean dressing using gauze sponges, not tissue or cotton balls. Peri-Wound Care: Skin Prep (Generic) Every Other Day/15 Days Discharge Instructions: Use skin prep as directed Prim Dressing: Hydrofera Blue Ready Foam, 2.5 x2.5 in Every Other Day/15 Days ary Discharge Instructions: Apply to wound bed as instructed Secondary Dressing: Woven Gauze Sponge, Non-Sterile 4x4 in (DME) (Generic) Every Other Day/15 Days Discharge Instructions: Apply over primary dressing as directed. Secondary Dressing: Bordered Gauze, 4x4 in (DME) (Generic) Every Other Day/15 Days Discharge Instructions: Apply over primary dressing as directed. Electronic Signature(s) Signed: 03/09/2021 7:08:24 PM By: Lorrin Jackson Signed: 03/10/2021 1:36:13 PM By: Linton Ham MD Entered By: Lorrin Jackson on 03/09/2021 15:53:56 -------------------------------------------------------------------------------- Problem List Details Patient Name: Date of Service: Rodney Lee. 03/09/2021 2:00 PM Medical Record Number: [id number] Patient Account Number: [id number] Date of Birth/Sex: Treating RN: 1940/12/13 (80 y.o. Rodney Lee Primary Care Provider: Simona Huh Other Clinician: Referring Provider: Treating Provider/Extender: Joseph Berkshire in Treatment: 2 Active Problems ICD-10 Encounter Code Description Active Date MDM Diagnosis L89.152 Pressure ulcer of sacral region, stage 2 02/23/2021 No Yes C85.10 Unspecified B-cell lymphoma, unspecified site 02/23/2021 No Yes Inactive Problems Resolved Problems Electronic Signature(s) Signed: 03/10/2021 1:36:13 PM By: Linton Ham MD Entered By: Linton Ham on 03/09/2021 16:00:12 -------------------------------------------------------------------------------- Progress Note Details Patient Name: Date of Service: Rodney Lee. 03/09/2021 2:00 PM Medical Record Number: [id number] Patient Account Number:  [id number] Date of Birth/Sex: Treating RN: 18-Aug-1941 (79 y.o. Rodney Lee Primary Care Provider: Simona Huh Other Clinician: Referring Provider: Treating Provider/Extender: Joseph Berkshire in Treatment: 2 Subjective History of Present Illness (HPI) ADMISSION 02/23/2021 This is a pleasant 80 year old man who arrived with his wife. He has been diagnosed this year with high-grade B-cell lymphoma stage III/IV. CT scan of the abdomen pelvis showed a soft tissue mass at 6.7 cm. Punch biopsy was consistent with a high-grade B-cell lymphoma. He is starting chemotherapy. His oncologist is Dr. Lorenso Courier. PET scan showed diffuse disease along the anterior abdominal wall pulmonary nodule lesions along the cortex of  the left and right kidney. He is being treated with RooGCVP. The patient feels generally weak. He spends most of his day in a recliner. His next chemotherapy is this Thursday and he will be back in a recliner. Noted by home health to have a wound on the lower sacrum/coccyx just to the right of the midline. They have been applying Vaseline or Vaseline gauze I did look at his wound on his abdomen which simply had an ABD pad over it. This is a malignant wound by imaging and biopsy. A large necrotic area. Seems to have minimal drainage however. No evidence of infection around this. Past medical history includes B-cell lymphoma as described, cardiomyopathy, CHF, history of V. tach 6/28 small area on the right buttock in close proximity to the sacrum. I did not look at his abdominal wound today. 7/5; right buttock in close proximity to the sacrum. Dry flaking skin over the surface which I removed with a #3 curette this is just about epithelialized Objective Constitutional Patient is hypotensive.. Vitals Time Taken: 3:15 PM, Height: 69 in, Weight: 196 lbs, BMI: 28.9, Temperature: 98.6 F, Pulse: 81 bpm, Respiratory Rate: 18 breaths/min, Blood Pressure: 89/52  mmHg, Pulse Oximetry: 90 %. Respiratory work of breathing is normal. General Notes: Wound exam; the area in question at the level of the coccyx just to the right of the midline within the gluteal cleft. I removed dry flaking skin from the surface with a #3 curette this looks as though it should close Integumentary (Hair, Skin) Wound #1 status is Open. Original cause of wound was Pressure Injury. The date acquired was: 02/15/2021. The wound has been in treatment 2 weeks. The wound is located on the Sacrum. The wound measures 0.5cm length x 0.8cm width x 0.1cm depth; 0.314cm^2 area and 0.031cm^3 volume. There is Fat Layer (Subcutaneous Tissue) exposed. There is no tunneling or undermining noted. There is a medium amount of serosanguineous drainage noted. The wound margin is distinct with the outline attached to the wound base. There is no granulation within the wound bed. There is a large (67-100%) amount of necrotic tissue within the wound bed including Adherent Slough. General Notes: drier slough to the wound bed Assessment Active Problems ICD-10 Pressure ulcer of sacral region, stage 2 Unspecified B-cell lymphoma, unspecified site Procedures Wound #1 Pre-procedure diagnosis of Wound #1 is a Pressure Ulcer located on the Sacrum . There was a Selective/Open Wound Skin/Epidermis Debridement with a total area of 0.4 sq cm performed by Ricard Dillon., MD. With the following instrument(s): Curette to remove Non-Viable tissue/material. Material removed includes Eschar and Skin: Epidermis and after achieving pain control using Lidocaine 5% topical ointment. No specimens were taken. A time out was conducted at 15:45, prior to the start of the procedure. A Minimum amount of bleeding was controlled with Pressure. The procedure was tolerated well. Post Debridement Measurements: 0.5cm length x 0.8cm width x 0.1cm depth; 0.031cm^3 volume. Post debridement Stage noted as Category/Stage II. Character of  Wound/Ulcer Post Debridement is stable. Post procedure Diagnosis Wound #1: Same as Pre-Procedure Plan Follow-up Appointments: Return Appointment in 1 week. - Dr. Dellia Nims Bathing/ Shower/ Hygiene: May shower with protection but do not get wound dressing(s) wet. Off-Loading: Roho cushion for wheelchair - or for any chair when sitting Turn and reposition every 2 hours WOUND #1: - Sacrum Wound Laterality: Cleanser: Soap and Water Every Other Day/15 Days Discharge Instructions: May shower and wash wound with dial antibacterial soap and water prior to dressing change. Cleanser:  Wound Cleanser (Generic) Every Other Day/15 Days Discharge Instructions: Cleanse the wound with wound cleanser prior to applying a clean dressing using gauze sponges, not tissue or cotton balls. Peri-Wound Care: Skin Prep (Generic) Every Other Day/15 Days Discharge Instructions: Use skin prep as directed Prim Dressing: Hydrofera Blue Ready Foam, 2.5 x2.5 in Every Other Day/15 Days ary Discharge Instructions: Apply to wound bed as instructed Secondary Dressing: Woven Gauze Sponge, Non-Sterile 4x4 in (DME) (Generic) Every Other Day/15 Days Discharge Instructions: Apply over primary dressing as directed. Secondary Dressing: Bordered Gauze, 4x4 in (DME) (Generic) Every Other Day/15 Days Discharge Instructions: Apply over primary dressing as directed. 1. I change the primary dressing to [facility] I keep on thinking that this will be closed by next week Electronic Signature(s) Signed: 03/10/2021 1:36:13 PM By: Linton Ham MD Entered By: Linton Ham on 03/09/2021 16:02:56 -------------------------------------------------------------------------------- SuperBill Details Patient Name: Date of Service: Rodney Lee 03/09/2021 Medical Record Number: [id number] Patient Account Number: [id number] Date of Birth/Sex: Treating RN: 1941/04/21 (80 y.o. Rodney Lee Primary Care Provider: Simona Huh Other  Clinician: Referring Provider: Treating Provider/Extender: Joseph Berkshire in Treatment: 2 Diagnosis Coding ICD-10 Codes Code Description [phone] Pressure ulcer of sacral region, stage 2 C85.10 Unspecified B-cell lymphoma, unspecified site Facility Procedures CPT4 Code: [id number] Description: [phone] - DEBRIDE WOUND 1ST 20 SQ CM OR < ICD-10 Diagnosis Description L89.152 Pressure ulcer of sacral region, stage 2 Modifier: Quantity: 1 Physician Procedures : CPT4 Code Description Modifier [id number] - WC PHYS DEBR WO ANESTH 20 SQ CM ICD-10 Diagnosis Description L89.152 Pressure ulcer of sacral region, stage 2 Quantity: 1 Electronic Signature(s) Signed: 03/10/2021 1:36:13 PM By: Linton Ham MD Entered By: Linton Ham on 03/09/2021 16:03:23

## 2021-03-12 DIAGNOSIS — R739 Hyperglycemia, unspecified: Secondary | ICD-10-CM | POA: Diagnosis not present

## 2021-03-12 DIAGNOSIS — I130 Hypertensive heart and chronic kidney disease with heart failure and stage 1 through stage 4 chronic kidney disease, or unspecified chronic kidney disease: Secondary | ICD-10-CM | POA: Diagnosis not present

## 2021-03-12 DIAGNOSIS — E1129 Type 2 diabetes mellitus with other diabetic kidney complication: Secondary | ICD-10-CM | POA: Diagnosis not present

## 2021-03-12 DIAGNOSIS — K219 Gastro-esophageal reflux disease without esophagitis: Secondary | ICD-10-CM | POA: Diagnosis not present

## 2021-03-12 DIAGNOSIS — Z794 Long term (current) use of insulin: Secondary | ICD-10-CM | POA: Diagnosis not present

## 2021-03-12 DIAGNOSIS — I959 Hypotension, unspecified: Secondary | ICD-10-CM | POA: Diagnosis not present

## 2021-03-12 DIAGNOSIS — N184 Chronic kidney disease, stage 4 (severe): Secondary | ICD-10-CM | POA: Diagnosis not present

## 2021-03-16 ENCOUNTER — Encounter (HOSPITAL_BASED_OUTPATIENT_CLINIC_OR_DEPARTMENT_OTHER): Payer: Medicare HMO | Admitting: Internal Medicine

## 2021-03-16 ENCOUNTER — Other Ambulatory Visit: Payer: Self-pay

## 2021-03-16 DIAGNOSIS — I429 Cardiomyopathy, unspecified: Secondary | ICD-10-CM | POA: Diagnosis not present

## 2021-03-16 DIAGNOSIS — L89152 Pressure ulcer of sacral region, stage 2: Secondary | ICD-10-CM | POA: Diagnosis not present

## 2021-03-16 DIAGNOSIS — E11622 Type 2 diabetes mellitus with other skin ulcer: Secondary | ICD-10-CM | POA: Diagnosis not present

## 2021-03-16 DIAGNOSIS — I509 Heart failure, unspecified: Secondary | ICD-10-CM | POA: Diagnosis not present

## 2021-03-16 DIAGNOSIS — I110 Hypertensive heart disease with heart failure: Secondary | ICD-10-CM | POA: Diagnosis not present

## 2021-03-17 ENCOUNTER — Inpatient Hospital Stay: Payer: Medicare HMO

## 2021-03-17 ENCOUNTER — Inpatient Hospital Stay (HOSPITAL_BASED_OUTPATIENT_CLINIC_OR_DEPARTMENT_OTHER): Payer: Medicare HMO | Admitting: Hematology and Oncology

## 2021-03-17 ENCOUNTER — Encounter: Payer: Self-pay | Admitting: Hematology and Oncology

## 2021-03-17 VITALS — BP 102/54 | HR 85 | Temp 98.8°F | Resp 15 | Ht 71.0 in | Wt 194.2 lb

## 2021-03-17 VITALS — BP 113/65 | HR 62 | Temp 97.7°F | Resp 17

## 2021-03-17 DIAGNOSIS — Z95828 Presence of other vascular implants and grafts: Secondary | ICD-10-CM

## 2021-03-17 DIAGNOSIS — Z5111 Encounter for antineoplastic chemotherapy: Secondary | ICD-10-CM | POA: Diagnosis not present

## 2021-03-17 DIAGNOSIS — C8510 Unspecified B-cell lymphoma, unspecified site: Secondary | ICD-10-CM

## 2021-03-17 DIAGNOSIS — D6481 Anemia due to antineoplastic chemotherapy: Secondary | ICD-10-CM | POA: Diagnosis not present

## 2021-03-17 DIAGNOSIS — Z5112 Encounter for antineoplastic immunotherapy: Secondary | ICD-10-CM | POA: Diagnosis not present

## 2021-03-17 LAB — CMP (CANCER CENTER ONLY)
ALT: 16 U/L (ref 0–44)
AST: 25 U/L (ref 15–41)
Albumin: 2.8 g/dL — ABNORMAL LOW (ref 3.5–5.0)
Alkaline Phosphatase: 70 U/L (ref 38–126)
Anion gap: 6 (ref 5–15)
BUN: 24 mg/dL — ABNORMAL HIGH (ref 8–23)
CO2: 27 mmol/L (ref 22–32)
Calcium: 8.5 mg/dL — ABNORMAL LOW (ref 8.9–10.3)
Chloride: 104 mmol/L (ref 98–111)
Creatinine: 2.41 mg/dL — ABNORMAL HIGH (ref 0.61–1.24)
GFR, Estimated: 26 mL/min — ABNORMAL LOW (ref >60)
Glucose, Bld: 224 mg/dL — ABNORMAL HIGH (ref 70–99)
Potassium: 4.7 mmol/L (ref 3.5–5.1)
Sodium: 137 mmol/L (ref 135–145)
Total Bilirubin: 0.4 mg/dL (ref 0.3–1.2)
Total Protein: 5.7 g/dL — ABNORMAL LOW (ref 6.5–8.1)

## 2021-03-17 LAB — CBC WITH DIFFERENTIAL (CANCER CENTER ONLY)
Abs Immature Granulocytes: 0.24 10*3/uL — ABNORMAL HIGH (ref 0.00–0.07)
Basophils Absolute: 0.1 10*3/uL (ref 0.0–0.1)
Basophils Relative: 1 %
Eosinophils Absolute: 0.1 10*3/uL (ref 0.0–0.5)
Eosinophils Relative: 3 %
HCT: 25.3 % — ABNORMAL LOW (ref 39.0–52.0)
Hemoglobin: 8.2 g/dL — ABNORMAL LOW (ref 13.0–17.0)
Immature Granulocytes: 5 %
Lymphocytes Relative: 14 %
Lymphs Abs: 0.7 10*3/uL (ref 0.7–4.0)
MCH: 34.3 pg — ABNORMAL HIGH (ref 26.0–34.0)
MCHC: 32.4 g/dL (ref 30.0–36.0)
MCV: 105.9 fL — ABNORMAL HIGH (ref 80.0–100.0)
Monocytes Absolute: 1 10*3/uL (ref 0.1–1.0)
Monocytes Relative: 20 %
Neutro Abs: 3 10*3/uL (ref 1.7–7.7)
Neutrophils Relative %: 57 %
Platelet Count: 135 10*3/uL — ABNORMAL LOW (ref 150–400)
RBC: 2.39 MIL/uL — ABNORMAL LOW (ref 4.22–5.81)
RDW: 20.6 % — ABNORMAL HIGH (ref 11.5–15.5)
WBC Count: 5.2 10*3/uL (ref 4.0–10.5)
nRBC: 0 % (ref 0.0–0.2)

## 2021-03-17 LAB — LACTATE DEHYDROGENASE: LDH: 204 U/L — ABNORMAL HIGH (ref 98–192)

## 2021-03-17 LAB — URIC ACID: Uric Acid, Serum: 2.5 mg/dL — ABNORMAL LOW (ref 3.7–8.6)

## 2021-03-17 MED ORDER — SODIUM CHLORIDE 0.9% FLUSH
10.0000 mL | INTRAVENOUS | Status: DC | PRN
  Administered 2021-03-17: 10 mL
  Filled 2021-03-17: qty 10

## 2021-03-17 MED ORDER — SODIUM CHLORIDE 0.9 % IV SOLN
Freq: Once | INTRAVENOUS | Status: AC
  Filled 2021-03-17: qty 250

## 2021-03-17 MED ORDER — ACETAMINOPHEN 325 MG PO TABS
650.0000 mg | ORAL_TABLET | Freq: Once | ORAL | Status: AC
  Administered 2021-03-17: 650 mg via ORAL

## 2021-03-17 MED ORDER — SODIUM CHLORIDE 0.9 % IV SOLN
560.0000 mg/m2 | Freq: Once | INTRAVENOUS | Status: AC
  Administered 2021-03-17: 1180 mg via INTRAVENOUS
  Filled 2021-03-17: qty 59

## 2021-03-17 MED ORDER — SODIUM CHLORIDE 0.9 % IV SOLN
10.0000 mg | Freq: Once | INTRAVENOUS | Status: AC
  Administered 2021-03-17: 10 mg via INTRAVENOUS
  Filled 2021-03-17: qty 10

## 2021-03-17 MED ORDER — VINCRISTINE SULFATE CHEMO INJECTION 1 MG/ML
2.0000 mg | Freq: Once | INTRAVENOUS | Status: AC
  Administered 2021-03-17: 2 mg via INTRAVENOUS
  Filled 2021-03-17: qty 2

## 2021-03-17 MED ORDER — SODIUM CHLORIDE 0.9% FLUSH
10.0000 mL | Freq: Once | INTRAVENOUS | Status: AC
  Administered 2021-03-17: 10 mL
  Filled 2021-03-17: qty 10

## 2021-03-17 MED ORDER — PALONOSETRON HCL INJECTION 0.25 MG/5ML
0.2500 mg | Freq: Once | INTRAVENOUS | Status: AC
Start: 2021-03-17 — End: 2021-03-17
  Administered 2021-03-17: 0.25 mg via INTRAVENOUS

## 2021-03-17 MED ORDER — SODIUM CHLORIDE 0.9 % IV SOLN
375.0000 mg/m2 | Freq: Once | INTRAVENOUS | Status: AC
  Administered 2021-03-17: 800 mg via INTRAVENOUS
  Filled 2021-03-17: qty 50

## 2021-03-17 MED ORDER — ACETAMINOPHEN 325 MG PO TABS
ORAL_TABLET | ORAL | Status: AC
  Filled 2021-03-17: qty 2

## 2021-03-17 MED ORDER — DIPHENHYDRAMINE HCL 25 MG PO CAPS
ORAL_CAPSULE | ORAL | Status: AC
  Filled 2021-03-17: qty 2

## 2021-03-17 MED ORDER — SODIUM CHLORIDE 0.9 % IV SOLN
750.0000 mg/m2 | Freq: Once | INTRAVENOUS | Status: AC
  Administered 2021-03-17: 1558 mg via INTRAVENOUS
  Filled 2021-03-17: qty 40.98

## 2021-03-17 MED ORDER — HEPARIN SOD (PORK) LOCK FLUSH 100 UNIT/ML IV SOLN
500.0000 [IU] | Freq: Once | INTRAVENOUS | Status: AC | PRN
Start: 2021-03-17 — End: 2021-03-17
  Administered 2021-03-17: 500 [IU]
  Filled 2021-03-17: qty 5

## 2021-03-17 MED ORDER — PALONOSETRON HCL INJECTION 0.25 MG/5ML
INTRAVENOUS | Status: AC
  Filled 2021-03-17: qty 5

## 2021-03-17 MED ORDER — DIPHENHYDRAMINE HCL 25 MG PO CAPS
50.0000 mg | ORAL_CAPSULE | Freq: Once | ORAL | Status: AC
  Administered 2021-03-17: 50 mg via ORAL

## 2021-03-17 NOTE — Progress Notes (Signed)
Rodney Lee ([id number]) , Visit Report for 03/16/2021 HPI Details Patient Name: Date of Service: Rodney Lee 03/16/2021 1:30 PM Medical Record Number: [id number] Patient Account Number: [id number] Date of Birth/Sex: Treating RN: [birth year] (80 y.o. Rodney Lee Primary Care Provider: Simona Huh Other Clinician: Referring Provider: Treating Provider/Extender: Joseph Berkshire in Treatment: 3 History of Present Illness HPI Description: ADMISSION 02/23/2021 This is a pleasant 80 year old man who arrived with his wife. He has been diagnosed this year with high-grade B-cell lymphoma stage III/IV. CT scan of the abdomen pelvis showed a soft tissue mass at 6.7 cm. Punch biopsy was consistent with a high-grade B-cell lymphoma. He is starting chemotherapy. His oncologist is Dr. Lorenso Courier. PET scan showed diffuse disease along the anterior abdominal wall pulmonary nodule lesions along the cortex of the left and right kidney. He is being treated with RGCVP. The patient feels generally weak. He spends most of his day in a recliner. His next chemotherapy is this Thursday and he will be back in a recliner. Noted by home health to have a wound on the lower sacrum/coccyx just to the right of the midline. They have been applying Vaseline or Vaseline gauze I did look at his wound on his abdomen which simply had an ABD pad over it. This is a malignant wound by imaging and biopsy. A large necrotic area. Seems to have minimal drainage however. No evidence of infection around this. Past medical history includes B-cell lymphoma as described, cardiomyopathy, CHF, history of V. tach 6/28 small area on the right buttock in close proximity to the sacrum. I did not look at his abdominal wound today. 7/5; right buttock in close proximity to the sacrum. Dry flaking skin over the surface which I removed with a #3 curette this is just about epithelialized 7/12; right buttock in  close proximity to the sacrum. This area is totally closed Electronic Signature(s) Signed: 03/16/2021 5:25:56 PM By: Linton Ham MD Entered By: Linton Ham on 03/16/2021 14:41:56 -------------------------------------------------------------------------------- Physical Exam Details Patient Name: Date of Service: Rodney Lee. 03/16/2021 1:30 PM Medical Record Number: [id number] Patient Account Number: [id number] Date of Birth/Sex: Treating RN: September 13, 1940 (80 y.o. Rodney Lee Primary Care Provider: Simona Huh Other Clinician: Referring Provider: Treating Provider/Extender: Joseph Berkshire in Treatment: 3 Constitutional Sitting or standing Blood Pressure is within target range for patient.. Pulse regular and within target range for patient.Marland Kitchen Respirations regular, non-labored and within target range.. Temperature is normal and within the target range for the patient.Marland Kitchen Appears in no distress. Notes Wound exam; the area in question is at the level of the coccyx just to the right of the midline. This is totally closed today fully epithelialized there is no open wound Electronic Signature(s) Signed: 03/16/2021 5:25:56 PM By: Linton Ham MD Entered By: Linton Ham on 03/16/2021 14:43:05 -------------------------------------------------------------------------------- Physician Orders Details Patient Name: Date of Service: Rodney Lee. 03/16/2021 1:30 PM Medical Record Number: [id number] Patient Account Number: [id number] Date of Birth/Sex: Treating RN: 19-Jun-1941 (80 y.o. Rodney Lee Primary Care Provider: Simona Huh Other Clinician: Referring Provider: Treating Provider/Extender: Joseph Berkshire in Treatment: 3 Verbal / Phone Orders: No Diagnosis Coding Discharge From [facility] Services Discharge from [place] Signature(s) Signed: 03/16/2021 5:25:56 PM By: Linton Ham  MD Signed: 03/17/2021 6:12:45 PM By: Rhae Hammock RN Entered By: Rhae Hammock on 03/16/2021 14:20:10 -------------------------------------------------------------------------------- Problem List Details Patient Name: Date of  Service: Rodney Lee 03/16/2021 1:30 PM Medical Record Number: [id number] Patient Account Number: [id number] Date of Birth/Sex: Treating RN: [birth year] (80 y.o. [name], Rodney Lee Primary Care Provider: Simona Huh Other Clinician: Referring Provider: Treating Provider/Extender: Joseph Berkshire in Treatment: 3 Active Problems ICD-10 Encounter Code Description Active Date MDM Diagnosis L89.152 Pressure ulcer of sacral region, stage 2 02/23/2021 No Yes C85.10 Unspecified B-cell lymphoma, unspecified site 02/23/2021 No Yes Inactive Problems Resolved Problems Electronic Signature(s) Signed: 03/16/2021 5:25:56 PM By: Linton Ham MD Entered By: Linton Ham on 03/16/2021 14:41:12 -------------------------------------------------------------------------------- Progress Note Details Patient Name: Date of Service: Rodney Lee. 03/16/2021 1:30 PM Medical Record Number: [id number] Patient Account Number: [id number] Date of Birth/Sex: Treating RN: 03/02/1941 (79 y.o. Rodney Lee Primary Care Provider: Simona Huh Other Clinician: Referring Provider: Treating Provider/Extender: Joseph Berkshire in Treatment: 3 Subjective History of Present Illness (HPI) ADMISSION 02/23/2021 This is a pleasant 79 year old man who arrived with his wife. He has been diagnosed this year with high-grade B-cell lymphoma stage III/IV. CT scan of the abdomen pelvis showed a soft tissue mass at 6.7 cm. Punch biopsy was consistent with a high-grade B-cell lymphoma. He is starting chemotherapy. His oncologist is Dr. Lorenso Courier. PET scan showed diffuse disease along the anterior abdominal wall pulmonary  nodule lesions along the cortex of the left and right kidney. He is being treated with RooGCVP. The patient feels generally weak. He spends most of his day in a recliner. His next chemotherapy is this Thursday and he will be back in a recliner. Noted by home health to have a wound on the lower sacrum/coccyx just to the right of the midline. They have been applying Vaseline or Vaseline gauze I did look at his wound on his abdomen which simply had an ABD pad over it. This is a malignant wound by imaging and biopsy. A large necrotic area. Seems to have minimal drainage however. No evidence of infection around this. Past medical history includes B-cell lymphoma as described, cardiomyopathy, CHF, history of V. tach 6/28 small area on the right buttock in close proximity to the sacrum. I did not look at his abdominal wound today. 7/5; right buttock in close proximity to the sacrum. Dry flaking skin over the surface which I removed with a #3 curette this is just about epithelialized 7/12; right buttock in close proximity to the sacrum. This area is totally closed Objective Constitutional Sitting or standing Blood Pressure is within target range for patient.. Pulse regular and within target range for patient.Marland Kitchen Respirations regular, non-labored and within target range.. Temperature is normal and within the target range for the patient.Marland Kitchen Appears in no distress. Vitals Time Taken: 1:45 PM, Height: 69 in, Weight: 196 lbs, BMI: 28.9, Temperature: 98.5 F, Pulse: 80 bpm, Respiratory Rate: 20 breaths/min, Blood Pressure: 100/60 mmHg. General Notes: manual BP 100/60 performed today related to machine 74/51. MD made aware. General Notes: Wound exam; the area in question is at the level of the coccyx just to the right of the midline. This is totally closed today fully epithelialized there is no open wound Integumentary (Hair, Skin) Wound #1 status is Healed - Epithelialized. Original cause of wound was Pressure  Injury. The date acquired was: 02/15/2021. The wound has been in treatment 3 weeks. The wound is located on the Sacrum. The wound measures 0cm length x 0cm width x 0cm depth; 0cm^2 area and 0cm^3 volume. Assessment Active Problems ICD-10 Pressure ulcer of  sacral region, stage 2 Unspecified B-cell lymphoma, unspecified site Plan Discharge From [facility] Services: Discharge from [place] 1. The patient can be discharged from the wound care center 2. Counseled to maintain pressure relief is much as possible Electronic Signature(s) Signed: 03/16/2021 5:25:56 PM By: Linton Ham MD Entered By: Linton Ham on 03/16/2021 14:43:34 -------------------------------------------------------------------------------- SuperBill Details Patient Name: Date of Service: Rodney Lee. 03/16/2021 Medical Record Number: [id number] Patient Account Number: [id number] Date of Birth/Sex: Treating RN: 1941-08-24 (79 y.o. Rodney Lee Primary Care Provider: Simona Huh Other Clinician: Referring Provider: Treating Provider/Extender: Joseph Berkshire in Treatment: 3 Diagnosis Coding ICD-10 Codes Code Description [phone] Pressure ulcer of sacral region, stage 2 C85.10 Unspecified B-cell lymphoma, unspecified site Facility Procedures CPT4 Code: [id number] Description: [phone] - WOUND CARE VISIT-LEV 2 EST PT Modifier: Quantity: 1 Physician Procedures : CPT4 Code Description Modifier [id number] - WC PHYS LEVEL 2 - EST PT ICD-10 Diagnosis Description L89.152 Pressure ulcer of sacral region, stage 2 Quantity: 1 Electronic Signature(s) Signed: 03/16/2021 5:25:56 PM By: Linton Ham MD Entered By: Linton Ham on 03/16/2021 14:43:48

## 2021-03-17 NOTE — Patient Instructions (Signed)
[facility]    Discharge Instructions: Thank you for choosing [facility] to provide your oncology and hematology care.   If you have a lab appointment with the [facility], please go directly to the [facility] and check in at the registration area.   Wear comfortable clothing and clothing appropriate for easy access to any Portacath or PICC line.   We strive to give you quality time with your provider. You may need to reschedule your appointment if you arrive late (15 or more minutes).  Arriving late affects you and other patients whose appointments are after yours.  Also, if you miss three or more appointments without notifying the office, you may be dismissed from the clinic at the provider's discretion.      For prescription refill requests, have your pharmacy contact our office and allow 72 hours for refills to be completed.    Today you received the following chemotherapy and/or immunotherapy agents: rituximab, vincristine, gemcitabine, and cyclophosphamide.     To help prevent nausea and vomiting after your treatment, we encourage you to take your nausea medication as directed.  BELOW ARE SYMPTOMS THAT SHOULD BE REPORTED IMMEDIATELY: *FEVER GREATER THAN 100.4 F (38 C) OR HIGHER *CHILLS OR SWEATING *NAUSEA AND VOMITING THAT IS NOT CONTROLLED WITH YOUR NAUSEA MEDICATION *UNUSUAL SHORTNESS OF BREATH *UNUSUAL BRUISING OR BLEEDING *URINARY PROBLEMS (pain or burning when urinating, or frequent urination) *BOWEL PROBLEMS (unusual diarrhea, constipation, pain near the anus) TENDERNESS IN MOUTH AND THROAT WITH OR WITHOUT PRESENCE OF ULCERS (sore throat, sores in mouth, or a toothache) UNUSUAL RASH, SWELLING OR PAIN  UNUSUAL VAGINAL DISCHARGE OR ITCHING   Items with * indicate a potential emergency and should be followed up as soon as possible or go to the Emergency Department if any problems should occur.  Please show the CHEMOTHERAPY  ALERT CARD or IMMUNOTHERAPY ALERT CARD at check-in to the Emergency Department and triage nurse.  Should you have questions after your visit or need to cancel or reschedule your appointment, please contact [facility]  Dept: [phone]  and follow the prompts.  Office hours are 8:00 a.m. to 4:30 p.m. Monday - Friday. Please note that voicemails left after 4:00 p.m. may not be returned until the following business day.  We are closed weekends and major holidays. You have access to a nurse at all times for urgent questions. Please call the main number to the clinic Dept: [phone] and follow the prompts.   For any non-urgent questions, you may also contact your provider using MyChart. We now offer e-Visits for anyone 37 and older to request care online for non-urgent symptoms. For details visit [email or web address].   Also download the MyChart app! Go to the app store, search "MyChart", open the app, select [facility], and log in with your MyChart username and password.  Due to Covid, a mask is required upon entering the hospital/clinic. If you do not have a mask, one will be given to you upon arrival. For doctor visits, patients may have 1 support person aged 74 or older with them. For treatment visits, patients cannot have anyone with them due to current Covid guidelines and our immunocompromised population.

## 2021-03-17 NOTE — Progress Notes (Signed)
Rodney Lee ([id number]) , Visit Report for 03/16/2021 Arrival Information Details Patient Name: Date of Service: Rodney Lee, Rodney Lee 03/16/2021 1:30 PM Medical Record Number: [id number] Patient Account Number: [id number] Date of Birth/Sex: Treating RN: 11-13-1940 (80 y.o. Rodney Lee, Meta.Reding Primary Care Phillip Maffei: Simona Huh Other Clinician: Referring Tyra Gural: Treating Jariah Jarmon/Extender: Joseph Berkshire in Treatment: 3 Visit Information History Since Last Visit Added or deleted any medications: No Patient Arrived: Ambulatory Any new allergies or adverse reactions: No Arrival Time: 13:45 Had a fall or experienced change in No Accompanied By: wife activities of daily living that may affect Transfer Assistance: None risk of falls: Patient Identification Verified: Yes Signs or symptoms of abuse/neglect since last visito No Secondary Verification Process Completed: Yes Hospitalized since last visit: No Patient Requires Transmission-Based Precautions: No Implantable device outside of the clinic excluding No Patient Has Alerts: No cellular tissue based products placed in the center since last visit: Has Dressing in Place as Prescribed: Yes Pain Present Now: No Electronic Signature(s) Signed: 03/16/2021 6:16:29 PM By: Rodney Lee Entered By: Rodney Lee on 03/16/2021 13:54:51 -------------------------------------------------------------------------------- Clinic Level of Care Assessment Details Patient Name: Date of Service: AHMIR, BRACKEN 03/16/2021 1:30 PM Medical Record Number: [id number] Patient Account Number: [id number] Date of Birth/Sex: Treating RN: Feb 03, 1941 (80 y.o. Rodney Lee, Rodney Lee Primary Care Ceasia Elwell: Simona Huh Other Clinician: Referring Elynore Dolinski: Treating Edras Wilford/Extender: Joseph Berkshire in Treatment: 3 Clinic Level of Care Assessment Items TOOL 4 Quantity Score X- 1 0 Use when only an  EandM is performed on FOLLOW-UP visit ASSESSMENTS - Nursing Assessment / Reassessment X- 1 10 Reassessment of Co-morbidities (includes updates in patient status) X- 1 5 Reassessment of Adherence to Treatment Plan ASSESSMENTS - Wound and Skin A ssessment / Reassessment X - Simple Wound Assessment / Reassessment - one wound 1 5 []  - 0 Complex Wound Assessment / Reassessment - multiple wounds []  - 0 Dermatologic / Skin Assessment (not related to wound area) ASSESSMENTS - Focused Assessment []  - 0 Circumferential Edema Measurements - multi extremities []  - 0 Nutritional Assessment / Counseling / Intervention []  - 0 Lower Extremity Assessment (monofilament, tuning fork, pulses) []  - 0 Peripheral Arterial Disease Assessment (using hand held doppler) ASSESSMENTS - Ostomy and/or Continence Assessment and Care []  - 0 Incontinence Assessment and Management []  - 0 Ostomy Care Assessment and Management (repouching, etc.) PROCESS - Coordination of Care X - Simple Patient / Family Education for ongoing care 1 15 []  - 0 Complex (extensive) Patient / Family Education for ongoing care X- 1 10 Staff obtains Programmer, systems, Records, T Results / Process Orders est []  - 0 Staff telephones HHA, Nursing Homes / Clarify orders / etc []  - 0 Routine Transfer to another Facility (non-emergent condition) []  - 0 Routine Hospital Admission (non-emergent condition) []  - 0 New Admissions / Biomedical engineer / Ordering NPWT Apligraf, etc. , []  - 0 Emergency Hospital Admission (emergent condition) X- 1 10 Simple Discharge Coordination []  - 0 Complex (extensive) Discharge Coordination PROCESS - Special Needs []  - 0 Pediatric / Minor Patient Management []  - 0 Isolation Patient Management []  - 0 Hearing / Language / Visual special needs []  - 0 Assessment of Community assistance (transportation, D/C planning, etc.) []  - 0 Additional assistance / Altered mentation []  - 0 Support Surface(s)  Assessment (bed, cushion, seat, etc.) INTERVENTIONS - Wound Cleansing / Measurement X - Simple Wound Cleansing - one wound 1 5 []  - 0 Complex Wound Cleansing -  multiple wounds X- 1 5 Wound Imaging (photographs - any number of wounds) []  - 0 Wound Tracing (instead of photographs) X- 1 5 Simple Wound Measurement - one wound []  - 0 Complex Wound Measurement - multiple wounds INTERVENTIONS - Wound Dressings []  - 0 Small Wound Dressing one or multiple wounds []  - 0 Medium Wound Dressing one or multiple wounds []  - 0 Large Wound Dressing one or multiple wounds []  - 0 Application of Medications - topical []  - 0 Application of Medications - injection INTERVENTIONS - Miscellaneous []  - 0 External ear exam []  - 0 Specimen Collection (cultures, biopsies, blood, body fluids, etc.) []  - 0 Specimen(s) / Culture(s) sent or taken to Lab for analysis []  - 0 Patient Transfer (multiple staff / Civil Service fast streamer / Similar devices) []  - 0 Simple Staple / Suture removal (25 or less) []  - 0 Complex Staple / Suture removal (26 or more) []  - 0 Hypo / Hyperglycemic Management (close monitor of Blood Glucose) []  - 0 Ankle / Brachial Index (ABI) - do not check if billed separately X- 1 5 Vital Signs Has the patient been seen at the hospital within the last three years: Yes Total Score: 75 Level Of Care: New/Established - Level 2 Electronic Signature(s) Signed: 03/17/2021 6:12:45 PM By: Rhae Hammock RN Entered By: Rhae Hammock on 03/16/2021 14:22:45 -------------------------------------------------------------------------------- Lower Extremity Assessment Details Patient Name: Date of Service: Rodney Lee. 03/16/2021 1:30 PM Medical Record Number: [id number] Patient Account Number: [id number] Date of Birth/Sex: Treating RN: Jan 14, 1941 (79 y.o. Rodney Lee Primary Care Amon Costilla: Simona Huh Other Clinician: Referring Arleene Settle: Treating Ebonye Reade/Extender: Joseph Berkshire in Treatment: 3 Electronic Signature(s) Signed: 03/16/2021 6:16:29 PM By: Rodney Lee Entered By: Rodney Lee on 03/16/2021 13:55:59 -------------------------------------------------------------------------------- Multi Wound Chart Details Patient Name: Date of Service: Rodney Lee. 03/16/2021 1:30 PM Medical Record Number: [id number] Patient Account Number: [id number] Date of Birth/Sex: Treating RN: August 18, 1941 (79 y.o. Rodney Lee, Rodney Lee Primary Care Rhayne Chatwin: Simona Huh Other Clinician: Referring Frimet Durfee: Treating Blayton Huttner/Extender: Joseph Berkshire in Treatment: 3 Vital Signs Height(in): 69 Pulse(bpm): 80 Weight(lbs): 196 Blood Pressure(mmHg): 100/60 Body Mass Index(BMI): 29 Temperature(F): 98.5 Respiratory Rate(breaths/min): 20 Photos: [1:No Photos Sacrum] [N/A:N/A N/A] Wound Location: [1:Pressure Injury] [N/A:N/A] Wounding Event: [1:Pressure Ulcer] [N/A:N/A] Primary Etiology: [1:02/15/2021] [N/A:N/A] Date Acquired: [1:3] [N/A:N/A] Weeks of Treatment: [1:Healed - Epithelialized] [N/A:N/A] Wound Status: [1:0x0x0] [N/A:N/A] Measurements L x W x D (cm) [1:0] [N/A:N/A] A (cm) : rea [1:0] [N/A:N/A] Volume (cm) : [1:100.00%] [N/A:N/A] % Reduction in A rea: [1:100.00%] [N/A:N/A] % Reduction in Volume: [1:Category/Stage II] [N/A:N/A] Treatment Notes Electronic Signature(s) Signed: 03/16/2021 5:25:56 PM By: Rodney Ham MD Signed: 03/17/2021 6:12:45 PM By: Rhae Hammock RN Entered By: Rodney Lee on 03/16/2021 14:41:17 -------------------------------------------------------------------------------- Multi-Disciplinary Care Plan Details Patient Name: Date of Service: Rodney Lee. 03/16/2021 1:30 PM Medical Record Number: [id number] Patient Account Number: [id number] Date of Birth/Sex: Treating RN: 20-Apr-1941 (80 y.o. [name] Primary Care Heba Ige: Simona Huh Other  Clinician: Referring Arrow Tomko: Treating Vanissa Strength/Extender: Joseph Berkshire in Treatment: 3 Active Inactive Electronic Signature(s) Signed: 03/17/2021 6:12:45 PM By: Rhae Hammock RN Entered By: Rhae Hammock on 03/16/2021 14:21:41 -------------------------------------------------------------------------------- Pain Assessment Details Patient Name: Date of Service: Rodney Lee 03/16/2021 1:30 PM Medical Record Number: [id number] Patient Account Number: [id number] Date of Birth/Sex: Treating RN: December 25, 1940 (79 y.o. Rodney Lee Primary Care Brandolyn Shortridge: Simona Huh Other Clinician: Referring Shawndra Clute: Treating  Fronie Holstein/Extender: Joseph Berkshire in Treatment: 3 Active Problems Location of Pain Severity and Description of Pain Patient Has Paino No Site Locations Rate the pain. Current Pain Level: 0 Pain Management and Medication Current Pain Management: Medication: No Cold Application: No Rest: No Massage: No Activity: No T.E.N.S.: No Heat Application: No Leg drop or elevation: No Is the Current Pain Management Adequate: Adequate How does your wound impact your activities of daily livingo Sleep: No Bathing: No Appetite: No Relationship With Others: No Bladder Continence: No Emotions: No Bowel Continence: No Work: No Toileting: No Drive: No Dressing: No Hobbies: No Electronic Signature(s) Signed: 03/16/2021 6:16:29 PM By: Rodney Lee Entered By: Rodney Lee on 03/16/2021 13:55:54 -------------------------------------------------------------------------------- Patient/Caregiver Education Details Patient Name: Date of Service: Rodney Lee 7/12/2022andnbsp1:30 PM Medical Record Number: [id number] Patient Account Number: [id number] Date of Birth/Gender: Treating RN: 1941-06-26 (80 y.o. [name] Primary Care Physician: Simona Huh Other Clinician: Referring  Physician: Treating Physician/Extender: Joseph Berkshire in Treatment: 3 Education Assessment Education Provided To: Patient Education Topics Provided Wound/Skin Impairment: Methods: Explain/Verbal Responses: State content correctly Electronic Signature(s) Signed: 03/17/2021 6:12:45 PM By: Rhae Hammock RN Entered By: Rhae Hammock on 03/16/2021 14:21:54 -------------------------------------------------------------------------------- Wound Assessment Details Patient Name: Date of Service: Rodney Lee. 03/16/2021 1:30 PM Medical Record Number: [id number] Patient Account Number: [id number] Date of Birth/Sex: Treating RN: [birth year] (79 y.o. Rodney Lee Primary Care Shaquon Gropp: Simona Huh Other Clinician: Referring Mandela Bello: Treating Arely Tinner/Extender: Joseph Berkshire in Treatment: 3 Wound Status Wound Number: 1 Primary Etiology: Pressure Ulcer Wound Location: Sacrum Wound Status: Healed - Epithelialized Wounding Event: Pressure Injury Date Acquired: 02/15/2021 Weeks Of Treatment: 3 Clustered Wound: No Wound Measurements Length: (cm) Width: (cm) Depth: (cm) Area: (cm) Volume: (cm) 0 % Reduction in Area: 100% 0 % Reduction in Volume: 100% 0 0 0 Wound Description Classification: Category/Stage II Electronic Signature(s) Signed: 03/16/2021 6:16:29 PM By: Rodney Lee Entered By: Rodney Lee on 03/16/2021 13:56:05 -------------------------------------------------------------------------------- Vitals Details Patient Name: Date of Service: Rodney Lee. 03/16/2021 1:30 PM Medical Record Number: [id number] Patient Account Number: [id number] Date of Birth/Sex: Treating RN: 22-Dec-1940 (79 y.o. Rodney Lee Primary Care Josanna Hefel: Simona Huh Other Clinician: Referring Jaidin Ugarte: Treating Olanrewaju Osborn/Extender: Joseph Berkshire in Treatment: 3 Vital Signs Time  Taken: 13:45 Temperature (F): 98.5 Height (in): 69 Pulse (bpm): 80 Weight (lbs): 196 Respiratory Rate (breaths/min): 20 Body Mass Index (BMI): 28.9 Blood Pressure (mmHg): 100/60 Reference Range: 80 - 120 mg / dl Notes manual BP 100/60 performed today related to machine 74/51. MD made aware. Electronic Signature(s) Signed: 03/16/2021 6:16:29 PM By: Rodney Lee Entered By: Rodney Lee on 03/16/2021 13:55:38

## 2021-03-17 NOTE — Progress Notes (Signed)
Rodney Lee [phone]   [phone]  PROGRESS NOTE  Patient Care Team: Gaynelle Arabian, MD as PCP - General (Family Medicine) Evans Lance, MD as PCP - Electrophysiology (Cardiology) [name], MD as PCP - Cardiology (Cardiology)  Hematological/Oncological History # High Grade B Cell Lymphoma, Stage III/IV 12/10/2020: CT Abdomen/Pelvis shows a soft tissue mass at 6.7 cm with soft tissue stranding.  12/14/2020: punch biopsy shows findings consistent with a high grade B cell lymphoma.  01/07/2021: establish care with Dr. Lorenso Courier  01/21/2021: PET CT scan shows diffuse disease with anterior abdominal wall lesion, pulmonary nodule, lesions along the cortex of the left/right kidney.  02/04/2021: Cycle 1 Day 1 of R-GCVP 02/25/2021: Cycle 2 Day 1 of R-GCVP. Held Cycle 2 Day 8 gemcitabine due to marked hyperglycemia. Patient admitted.  03/17/2021: Cycle 3 Day 1 of R-GCVP  Interval History:  Rodney Lee 80 y.o. male with medical history significant for advanced stage high grade B cell lymphoma who presents for a follow up visit. The patient's last visit was on 02/24/2021. In the interim since the last visit he developed a hyperglycemia of 657 on 03/03/2021.  He was admitted to the hospital at that time.  On exam today Rodney Lee is accompanied by his wife.  He reports that the lesion on his abdomen has continued to respond to treatment.  He notes the area on the back of his neck has completely resolved.  He reports that he tolerated chemotherapy without any nausea, vomiting, or diarrhea.  He does have a large amount of fatigue and spends a lot of his day in bed or in his recliner.  His wife reports that he can sleep quite a lot and typically spends 8 hours in bed but then can sleep up to 20 hours a day overall.  His wife is also concerned that he has a mental fogging which appears worse around the time of chemotherapy.  He reports that his appetite is good and he  otherwise has no questions concerns or complaints.  A full 10 point ROS is listed below.  MEDICAL HISTORY:  Past Medical History:  Diagnosis Date   Atrial fibrillation ([place])    CHADS2Vasc is at least 5, on Xarelto   CAD (coronary artery disease)    cardiomyopathy    MDT ICD, Dr. Lovena Le 2009   CHF (congestive heart failure) ([place])    Diabetes mellitus    Exogenous obesity    Hyperlipidemia    Hypertension    Hypothyroidism    MI, old 72   ANTEROLATERAL, PTCA LAD per pt   Sinus bradycardia     SURGICAL HISTORY: Past Surgical History:  Procedure Laterality Date   ANGIOPLASTY     CARDIAC CATHETERIZATION  03/13/1994   ACUTE MI WITH TOTAL OCCLUSION OF MID LAD. REPERFUSION AFTER CROSSING WITH A GUIDEWIRE. POOR RIGHT TO LEFT COLLATERAL FLOW. Pt states had PTCA   CARDIAC DEFIBRILLATOR PLACEMENT     CARDIOVASCULAR STRESS TEST  03/11/2008   EF 30%. NO REVERSIBLE ISCHEMIA   EP IMPLANTABLE DEVICE N/A 09/28/2015   Procedure:  ICD Generator Changeout;  Surgeon: Evans Lance, MD;  Location: [facility];  Service: Cardiovascular;  Laterality: N/A;   IR IMAGING GUIDED PORT INSERTION  01/28/2021   US ECHOCARDIOGRAPHY  04/26/2010   EF 35-40%. MODERATE LVH WITH MODERATE GLOBAL LV SYSTOLIC DYSFUNCTION AND IMPAIRED RELAXATION. MILD AORTIC SCLEROSIS WITHOUT STENOSIS. NORMAL PULMONARY ARTERY PRESSURE.   US ECHOCARDIOGRAPHY  10/07/2004   EF  30-35%    SOCIAL HISTORY: Social History   Socioeconomic History   Marital status: Married    Spouse name: Not on file   Number of children: Not on file   Years of education: Not on file   Highest education level: Not on file  Occupational History   Occupation: Retired  Tobacco Use   Smoking status: Never   Smokeless tobacco: Never  Vaping Use   Vaping Use: Never used  Substance and Sexual Activity   Alcohol use: No   Drug use: No   Sexual activity: Not on file  Other Topics Concern   Not on file  Social History Narrative   Lives with wife    Social Determinants of Health   Financial Resource Strain: Not on file  Food Insecurity: Not on file  Transportation Needs: Not on file  Physical Activity: Not on file  Stress: Not on file  Social Connections: Not on file  Intimate Partner Violence: Not on file    FAMILY HISTORY: Family History  Problem Relation Age of Onset   Heart disease Mother    Heart attack Mother     ALLERGIES:  is allergic to canagliflozin and lipitor [atorvastatin calcium].  MEDICATIONS:  Current Outpatient Medications  Medication Sig Dispense Refill   allopurinol (ZYLOPRIM) 300 MG tablet Take 1 tablet (300 mg total) by mouth daily. 90 tablet 1   amiodarone (PACERONE) 200 MG tablet TAKE 1 TABLET BY MOUTH  DAILY 90 tablet 2   blood glucose meter kit and supplies Dispense based on patient and insurance preference. Use up to four times daily as directed. (FOR ICD-10 E10.9, E11.9). 1 each 0   carvedilol (COREG) 6.25 MG tablet TAKE 1 TABLET BY MOUTH  TWICE DAILY WITH A MEAL 180 tablet 2   citalopram (CELEXA) 20 MG tablet Take 20 mg by mouth daily.     glimepiride (AMARYL) 1 MG tablet Take 1 mg by mouth daily with breakfast.     insulin lispro (HUMALOG KWIKPEN) 100 UNIT/ML KwikPen Use following sliding scale provided, at meal times as long as eating > 50% of your meal, up to three times daily. 15 mL 0   Insulin Pen Needle 31G X 5 MM MISC Use with insulin up to three times daily with meals 100 each 0   isosorbide mononitrate (IMDUR) 60 MG 24 hr tablet Take 60 mg in the morning and 30 mg (half a tablet) in the evening. 135 tablet 3   levothyroxine (SYNTHROID) 125 MCG tablet Take 125 mcg by mouth daily.     lidocaine-prilocaine (EMLA) cream Apply 1 application topically as needed. 30 g 0   Multiple Vitamin (MULTIVITAMIN) tablet Take 1 tablet by mouth daily.     nitroGLYCERIN (NITROSTAT) 0.4 MG SL tablet Place 1 tablet (0.4 mg total) under the tongue every 5 (five) minutes as needed for chest pain. 25 tablet 2    ondansetron (ZOFRAN) 8 MG tablet Take 1 tablet (8 mg total) by mouth every 8 (eight) hours as needed for nausea or vomiting. 30 tablet 0   ONETOUCH ULTRA test strip USE 1 STRIP TO CHECK GLUCOSE AS DIRECTED     predniSONE (DELTASONE) 20 MG tablet Take 3 tablets (60 mg total) by mouth as directed. Take 60 mg  ( 3 tablets) by mouth every morning on Days 1 to 5 of chemotherapy. 15 tablet 5   prochlorperazine (COMPAZINE) 10 MG tablet TAKE 1 TABLET BY MOUTH EVERY 6 HOURS AS NEEDED FOR NAUSEA OR  VOMITING 30  tablet 0   repaglinide (PRANDIN) 2 MG tablet Take 2 mg by mouth 2 (two) times daily before a meal.     rosuvastatin (CRESTOR) 40 MG tablet Take 40 mg by mouth daily.     vitamin C (ASCORBIC ACID) 500 MG tablet Take 500 mg by mouth daily.     No current facility-administered medications for this visit.   Facility-Administered Medications Ordered in Other Visits  Medication Dose Route Frequency Provider Last Rate Last Admin   sodium chloride flush (NS) 0.9 % injection 10 mL  10 mL Intracatheter PRN Orson Slick, MD   10 mL at 03/17/21 1528    REVIEW OF SYSTEMS:   Constitutional: ( - ) fevers, ( - )  chills , ( - ) night sweats Eyes: ( - ) blurriness of vision, ( - ) double vision, ( - ) watery eyes Ears, nose, mouth, throat, and face: ( - ) mucositis, ( - ) sore throat Respiratory: ( - ) cough, ( - ) dyspnea, ( - ) wheezes Cardiovascular: ( - ) palpitation, ( - ) chest discomfort, ( - ) lower extremity swelling Gastrointestinal:  ( - ) nausea, ( - ) heartburn, ( - ) change in bowel habits Skin: ( - ) abnormal skin rashes Lymphatics: ( - ) new lymphadenopathy, ( - ) easy bruising Neurological: ( - ) numbness, ( - ) tingling, ( - ) new weaknesses Behavioral/Psych: ( - ) mood change, ( - ) new changes  All other systems were reviewed with the patient and are negative.  PHYSICAL EXAMINATION: ECOG PERFORMANCE STATUS: 2 - Symptomatic, <50% confined to bed  Vitals:   03/17/21 0806  BP:  (!) 102/54  Pulse: 85  Resp: 15  Temp: 98.8 F (37.1 C)  SpO2: 99%   Filed Weights   03/17/21 0806  Weight: 194 lb 3.2 oz (88.1 kg)    GENERAL: well appearing elderly Caucasian male. alert, no distress and comfortable SKIN: prior lesion on back on head resolved. Otherwise skin color, texture, turgor are normal, no rashes or significant lesions EYES: conjunctiva are pink and non-injected, sclera clear LUNGS: clear to auscultation and percussion with normal breathing effort HEART: regular rate & rhythm and no murmurs and no lower extremity edema ABDOMEN: Smaller left-sided lesion with some necrosis and drainage, but no overt signs of infection. Appears to be improving.  Consistent with lymphoma noted on PET CT scan. Musculoskeletal: no cyanosis of digits and no clubbing  PSYCH: alert & oriented x 3, fluent speech NEURO: no focal motor/sensory deficits  LABORATORY DATA:  I have reviewed the data as listed CBC Latest Ref Rng & Units 03/17/2021 03/10/2021 03/04/2021  WBC 4.0 - 10.5 K/uL 5.2 2.0(L) 1.3(LL)  Hemoglobin 13.0 - 17.0 g/dL 8.2(L) 7.9(L) 8.4(L)  Hematocrit 39.0 - 52.0 % 25.3(L) 24.3(L) 24.9(L)  Platelets 150 - 400 K/uL 135(L) 32(L) 126(L)    CMP Latest Ref Rng & Units 03/17/2021 03/10/2021 03/04/2021  Glucose 70 - 99 mg/dL 224(H) 263(H) 117(H)  BUN 8 - 23 mg/dL 24(H) 26(H) 37(H)  Creatinine 0.61 - 1.24 mg/dL 2.41(H) 1.93(H) 1.65(H)  Sodium 135 - 145 mmol/L 137 134(L) 134(L)  Potassium 3.5 - 5.1 mmol/L 4.7 5.0 3.8  Chloride 98 - 111 mmol/L 104 102 96(L)  CO2 22 - 32 mmol/L 27 28 32  Calcium 8.9 - 10.3 mg/dL 8.5(L) 8.2(L) 8.6(L)  Total Protein 6.5 - 8.1 g/dL 5.7(L) 5.7(L) -  Total Bilirubin 0.3 - 1.2 mg/dL 0.4 0.5 -  Alkaline Phos  38 - 126 U/L 70 78 -  AST 15 - 41 U/L 25 19 -  ALT 0 - 44 U/L 16 23 -    RADIOGRAPHIC STUDIES: I have personally reviewed the radiological images as listed and agreed with the findings in the report: involvement of the left abdomen, the kidney's  bilaterally, and pulmonary nodule.   CUP PACEART REMOTE DEVICE CHECK  Result Date: 03/08/2021 Scheduled remote reviewed. Normal device function.  Next remote 91 days. HB    ASSESSMENT & PLAN BRASON BERTHELOT 80 y.o. male with medical history significant for advanced stage high grade B cell lymphoma who presents for a follow up visit.   Given Mr. Schlup's complicated medical history with cardiac dysfunction, renal dysfunction, and advanced age I am concerned that he would not be able to tolerate anthracycline-based therapy such as R mini CHOP.  As such the best regimen to proceed with given his comorbidities would be R-GCVP which is not toxic to the heart or filter to the kidney and can be used in patients with advanced age.  At this time we recommend proceeding with R-GCVP chemotherapy given his advanced age, cardiac dysfunction, and poor renal function.  Previously discussed the risks and benefits of treatment including nausea, vomiting, diarrhea, neuropathy, and allergic reactions.  Patient has an IPI showing he has high risk with lower rates of success with these treatment regimens.  Patient voices understanding of the risks and benefits of treatment was willing to proceed.  We will plan for 21 day cycles x 6 cycles. Rituximab 353m/m2 IV on Day 1, Cyclophosphamide 750 mg /m2 on Day 1, Vincristine 1.4637mm2 (capped at 37m31mon Day 1, and Prednisone 69m57my 1-5 (protocol calls for prednisolone, but will adjust to lower dose prednisone). The Gemcitabine is to be administered on Day 1-8 and will be 750 mg/m2 IV over 30 minutes once per day on days 1 & 8 for Cycle 1, 875 mg/m2 IV over 30 minutes once per day on days 1 & 8 for Cycle 2, and 1000 mg/m2 IV over 30 minutes once per day on days 1 & 8 for Cycle 3 and onward.   # High Grade B Cell Lymphoma, Stage III/IV --PET CT scan findings consistent with at least Stage III disease, with organ involvement most likely a Stage IV --avoiding anthracycline therapy  and alternative regimens given his poor cardiac and renal function -- 02/04/2021 was Cycle 1 Day 1 of R-GCVP --Cycle 2 Day 8 was held due to hyperglycemia.  --today is Cycle 3 Day 1 of R-GCVP --plan for restarting PET CT scan prior to Cycle 4.  --patient will RTC next week for Day 8 of treatment. Will assure weekly labs between now and next Cycle. Next clinic visit in 3 weeks time.   #Chemotherapy Induced Anemia #Chemotherapy Induced Thrombocytopenia --Hgb 8.2 today, improved from 6.5 on 02/18/2021 --Plt 135 today, up from 32 on 03/10/2021.  --continue to monitor with transfusion support as needed.   #Supportive Care -- chemotherapy education complete -- port placed -- zofran 8mg 35m PRN and compazine 10mg 53m6H for nausea -- allopurinol 300mg P57mily for TLS prophylaxis -- EMLA cream for port -- no pain medication required at this time.   No orders of the defined types were placed in this encounter.  All questions were answered. The patient knows to call the clinic with any problems, questions or concerns.  A total of more than 30 minutes were spent on this encounter with face-to-face time and  non-face-to-face time, including preparing to see the patient, ordering tests and/or medications, counseling the patient and coordination of care as outlined above.   Ledell Peoples, MD Department of Hematology/Oncology [place] [facility] Phone: [phone] Pager: [phone] Email: [email or web address]  03/17/2021 5:20 PM

## 2021-03-17 NOTE — Patient Instructions (Signed)

## 2021-03-17 NOTE — Progress Notes (Signed)
Per Dr. Lorenso Courier, [facility] to treat with today's labs

## 2021-03-22 ENCOUNTER — Telehealth: Payer: Self-pay | Admitting: Hematology and Oncology

## 2021-03-22 NOTE — Telephone Encounter (Signed)
Scheduled per los. Called, not able to leave msg. Mailed printout and Will give printout at appt on 7/20.

## 2021-03-24 ENCOUNTER — Inpatient Hospital Stay: Payer: Medicare HMO

## 2021-03-24 ENCOUNTER — Other Ambulatory Visit: Payer: Self-pay

## 2021-03-24 ENCOUNTER — Telehealth: Payer: Self-pay | Admitting: Cardiovascular Disease

## 2021-03-24 VITALS — BP 94/48 | HR 62 | Temp 98.0°F | Resp 15 | Ht 71.0 in | Wt 188.8 lb

## 2021-03-24 DIAGNOSIS — Z95828 Presence of other vascular implants and grafts: Secondary | ICD-10-CM

## 2021-03-24 DIAGNOSIS — Z5112 Encounter for antineoplastic immunotherapy: Secondary | ICD-10-CM | POA: Diagnosis not present

## 2021-03-24 DIAGNOSIS — C8510 Unspecified B-cell lymphoma, unspecified site: Secondary | ICD-10-CM

## 2021-03-24 DIAGNOSIS — D6481 Anemia due to antineoplastic chemotherapy: Secondary | ICD-10-CM | POA: Diagnosis not present

## 2021-03-24 DIAGNOSIS — Z5111 Encounter for antineoplastic chemotherapy: Secondary | ICD-10-CM | POA: Diagnosis not present

## 2021-03-24 LAB — CMP (CANCER CENTER ONLY)
ALT: 23 U/L (ref 0–44)
AST: 25 U/L (ref 15–41)
Albumin: 3.1 g/dL — ABNORMAL LOW (ref 3.5–5.0)
Alkaline Phosphatase: 63 U/L (ref 38–126)
Anion gap: 7 (ref 5–15)
BUN: 35 mg/dL — ABNORMAL HIGH (ref 8–23)
CO2: 28 mmol/L (ref 22–32)
Calcium: 8.4 mg/dL — ABNORMAL LOW (ref 8.9–10.3)
Chloride: 102 mmol/L (ref 98–111)
Creatinine: 1.83 mg/dL — ABNORMAL HIGH (ref 0.61–1.24)
GFR, Estimated: 37 mL/min — ABNORMAL LOW (ref >60)
Glucose, Bld: 290 mg/dL — ABNORMAL HIGH (ref 70–99)
Potassium: 4.6 mmol/L (ref 3.5–5.1)
Sodium: 137 mmol/L (ref 135–145)
Total Bilirubin: 0.7 mg/dL (ref 0.3–1.2)
Total Protein: 5.7 g/dL — ABNORMAL LOW (ref 6.5–8.1)

## 2021-03-24 LAB — CBC WITH DIFFERENTIAL (CANCER CENTER ONLY)
Abs Immature Granulocytes: 0.02 10*3/uL (ref 0.00–0.07)
Basophils Absolute: 0 10*3/uL (ref 0.0–0.1)
Basophils Relative: 2 %
Eosinophils Absolute: 0 10*3/uL (ref 0.0–0.5)
Eosinophils Relative: 3 %
HCT: 25.2 % — ABNORMAL LOW (ref 39.0–52.0)
Hemoglobin: 8.3 g/dL — ABNORMAL LOW (ref 13.0–17.0)
Immature Granulocytes: 1 %
Lymphocytes Relative: 16 %
Lymphs Abs: 0.2 10*3/uL — ABNORMAL LOW (ref 0.7–4.0)
MCH: 34.3 pg — ABNORMAL HIGH (ref 26.0–34.0)
MCHC: 32.9 g/dL (ref 30.0–36.0)
MCV: 104.1 fL — ABNORMAL HIGH (ref 80.0–100.0)
Monocytes Absolute: 0 10*3/uL — ABNORMAL LOW (ref 0.1–1.0)
Monocytes Relative: 3 %
Neutro Abs: 1.1 10*3/uL — ABNORMAL LOW (ref 1.7–7.7)
Neutrophils Relative %: 75 %
Platelet Count: 90 10*3/uL — ABNORMAL LOW (ref 150–400)
RBC: 2.42 MIL/uL — ABNORMAL LOW (ref 4.22–5.81)
RDW: 20.1 % — ABNORMAL HIGH (ref 11.5–15.5)
WBC Count: 1.4 10*3/uL — ABNORMAL LOW (ref 4.0–10.5)
nRBC: 0 % (ref 0.0–0.2)

## 2021-03-24 LAB — LACTATE DEHYDROGENASE: LDH: 163 U/L (ref 98–192)

## 2021-03-24 LAB — URIC ACID: Uric Acid, Serum: 1.9 mg/dL — ABNORMAL LOW (ref 3.7–8.6)

## 2021-03-24 MED ORDER — SODIUM CHLORIDE 0.9% FLUSH
10.0000 mL | INTRAVENOUS | Status: DC | PRN
  Administered 2021-03-24: 10 mL
  Filled 2021-03-24: qty 10

## 2021-03-24 MED ORDER — SODIUM CHLORIDE 0.9% FLUSH
10.0000 mL | Freq: Once | INTRAVENOUS | Status: AC
  Administered 2021-03-24: 10 mL
  Filled 2021-03-24: qty 10

## 2021-03-24 MED ORDER — PROCHLORPERAZINE MALEATE 10 MG PO TABS
ORAL_TABLET | ORAL | Status: AC
  Filled 2021-03-24: qty 1

## 2021-03-24 MED ORDER — SODIUM CHLORIDE 0.9 % IV SOLN
Freq: Once | INTRAVENOUS | Status: AC
  Filled 2021-03-24: qty 250

## 2021-03-24 MED ORDER — PROCHLORPERAZINE MALEATE 10 MG PO TABS
10.0000 mg | ORAL_TABLET | Freq: Once | ORAL | Status: AC
  Administered 2021-03-24: 10 mg via ORAL

## 2021-03-24 MED ORDER — SODIUM CHLORIDE 0.9 % IV SOLN
750.0000 mg/m2 | Freq: Once | INTRAVENOUS | Status: AC
  Administered 2021-03-24: 1558 mg via INTRAVENOUS
  Filled 2021-03-24: qty 40.98

## 2021-03-24 MED ORDER — HEPARIN SOD (PORK) LOCK FLUSH 100 UNIT/ML IV SOLN
500.0000 [IU] | Freq: Once | INTRAVENOUS | Status: AC | PRN
  Administered 2021-03-24: 500 [IU]
  Filled 2021-03-24: qty 5

## 2021-03-24 NOTE — Patient Instructions (Signed)
[facility]ICAL [facility]   ?Discharge Instructions: ?Thank you for choosing [facility] to provide your oncology and hematology care.  ? ?If you have a lab appointment with the Cancer Center, please go directly to the Cancer Center and check in at the registration area. ?  ?Wear comfortable clothing and clothing appropriate for easy access to any Portacath or PICC line.  ? ?We strive to give you quality time with your provider. You may need to reschedule your appointment if you arrive late (15 or more minutes).  Arriving late affects you and other patients whose appointments are after yours.  Also, if you miss three or more appointments without notifying the office, you may be dismissed from the clinic at the provider?s discretion.    ?  ?For prescription refill requests, have your pharmacy contact our office and allow 72 hours for refills to be completed.   ? ?Today you received the following chemotherapy and/or immunotherapy agents: gemcitabine    ?  ?To help prevent nausea and vomiting after your treatment, we encourage you to take your nausea medication as directed. ? ?BELOW ARE SYMPTOMS THAT SHOULD BE REPORTED IMMEDIATELY: ?*FEVER GREATER THAN 100.4 F (38 ?C) OR HIGHER ?*CHILLS OR SWEATING ?*NAUSEA AND VOMITING THAT IS NOT CONTROLLED WITH YOUR NAUSEA MEDICATION ?*UNUSUAL SHORTNESS OF BREATH ?*UNUSUAL BRUISING OR BLEEDING ?*URINARY PROBLEMS (pain or burning when urinating, or frequent urination) ?*BOWEL PROBLEMS (unusual diarrhea, constipation, pain near the anus) ?TENDERNESS IN MOUTH AND THROAT WITH OR WITHOUT PRESENCE OF ULCERS (sore throat, sores in mouth, or a toothache) ?UNUSUAL RASH, SWELLING OR PAIN  ?UNUSUAL VAGINAL DISCHARGE OR ITCHING  ? ?Items with * indicate a potential emergency and should be followed up as soon as possible or go to the Emergency Department if any problems should occur. ? ?Please show the CHEMOTHERAPY ALERT CARD or IMMUNOTHERAPY ALERT CARD at check-in  to the Emergency Department and triage nurse. ? ?Should you have questions after your visit or need to cancel or reschedule your appointment, please contact [facility]  Dept: [phone]  and follow the prompts.  Office hours are 8:00 a.m. to 4:30 p.m. Monday - Friday. Please note that voicemails left after 4:00 p.m. may not be returned until the following business day.  We are closed weekends and major holidays. You have access to a nurse at all times for urgent questions. Please call the main number to the clinic Dept: [phone] and follow the prompts. ? ? ?For any non-urgent questions, you may also contact your provider using MyChart. We now offer e-Visits for anyone 18 and older to request care online for non-urgent symptoms. For details visit [email or web address]. ?  ?Also download the MyChart app! Go to the app store, search "MyChart", open the app, select [facility], and log in with your MyChart username and password. ? ?Due to Covid, a mask is required upon entering the hospital/clinic. If you do not have a mask, one will be given to you upon arrival. For doctor visits, patients may have 1 support person aged 18 or older with them. For treatment visits, patients cannot have anyone with them due to current Covid guidelines and our immunocompromised population.  ? ?

## 2021-03-24 NOTE — Telephone Encounter (Signed)
*  STAT* If patient is at the pharmacy, call can be transferred to refill team.   1. Which medications need to be refilled? (please list name of each medication and dose if known) amiodarone (PACERONE) 200 MG tablet  2. Which pharmacy/location (including street and city if local pharmacy) is medication to be sent to? [place]  3. Do they need a 30 day or 90 day supply? 90 day

## 2021-03-24 NOTE — Progress Notes (Signed)
Injection appt. changed to 2:00 pm tomorrow. I spoke with the Patient's wife on the phone to let her know of the change in time

## 2021-03-24 NOTE — Progress Notes (Signed)
Remote ICD transmission.   

## 2021-03-24 NOTE — Progress Notes (Signed)
Ok to treat w/ ANC 1.1 , platelet 90 and creatinine 1.83 per Dr. Lorenso Courier

## 2021-03-25 ENCOUNTER — Inpatient Hospital Stay: Payer: Medicare HMO

## 2021-03-25 VITALS — BP 92/48 | HR 81 | Temp 99.0°F | Resp 16

## 2021-03-25 DIAGNOSIS — C8510 Unspecified B-cell lymphoma, unspecified site: Secondary | ICD-10-CM

## 2021-03-25 DIAGNOSIS — D6481 Anemia due to antineoplastic chemotherapy: Secondary | ICD-10-CM | POA: Diagnosis not present

## 2021-03-25 DIAGNOSIS — Z5112 Encounter for antineoplastic immunotherapy: Secondary | ICD-10-CM | POA: Diagnosis not present

## 2021-03-25 DIAGNOSIS — Z5111 Encounter for antineoplastic chemotherapy: Secondary | ICD-10-CM | POA: Diagnosis not present

## 2021-03-25 MED ORDER — AMIODARONE HCL 200 MG PO TABS: 200.0000 mg | ORAL_TABLET | Freq: Every day | ORAL | 0 refills | Status: DC

## 2021-03-25 MED ORDER — PEGFILGRASTIM-BMEZ 6 MG/0.6ML ~~LOC~~ SOSY
6.0000 mg | PREFILLED_SYRINGE | Freq: Once | SUBCUTANEOUS | Status: AC
  Administered 2021-03-25: 6 mg via SUBCUTANEOUS

## 2021-03-25 MED ORDER — PEGFILGRASTIM-BMEZ 6 MG/0.6ML ~~LOC~~ SOSY
PREFILLED_SYRINGE | SUBCUTANEOUS | Status: AC
  Filled 2021-03-25: qty 0.6

## 2021-03-25 NOTE — Patient Instructions (Signed)

## 2021-03-25 NOTE — Progress Notes (Signed)
Rodney Lee ([id number]) , Visit Report for 03/02/2021 Arrival Information Details Patient Name: Date of Service: Rodney Lee 03/02/2021 12:30 PM Medical Record Number: [id number] Patient Account Number: [id number] Date of Birth/Sex: Treating RN: 03/22/1941 (80 y.o. [name], [name] Primary Care Aoife Bold: Simona Huh Other Clinician: Referring Denario Bagot: Treating Mayford Alberg/Extender: Joseph Berkshire in Treatment: 1 Visit Information History Since Last Visit Added or deleted any medications: No Patient Arrived: Ambulatory Any new allergies or adverse reactions: No Arrival Time: 13:00 Had a fall or experienced change in No Accompanied By: wife activities of daily living that may affect Transfer Assistance: None risk of falls: Patient Identification Verified: Yes Signs or symptoms of abuse/neglect since last visito No Secondary Verification Process Completed: Yes Hospitalized since last visit: No Patient Requires Transmission-Based Precautions: No Implantable device outside of the clinic excluding No Patient Has Alerts: No cellular tissue based products placed in the center since last visit: Has Dressing in Place as Prescribed: Yes Pain Present Now: No Electronic Signature(s) Signed: 03/02/2021 4:39:21 PM By: Sandre Kitty Entered By: Sandre Kitty on 03/02/2021 13:01:06 -------------------------------------------------------------------------------- Encounter Discharge Information Details Patient Name: Date of Service: Rodney Lee. 03/02/2021 12:30 PM Medical Record Number: [id number] Patient Account Number: [id number] Date of Birth/Sex: Treating RN: [birth year] (79 y.o. Rodney Lee Primary Care Kysha Muralles: Simona Huh Other Clinician: Referring Miyana Mordecai: Treating Ariea Rochin/Extender: Joseph Berkshire in Treatment: 1 Encounter Discharge Information Items Post Procedure Vitals Discharge Condition:  Stable Temperature (F): 98.6 Ambulatory Status: Ambulatory Pulse (bpm): 67 Discharge Destination: Home Respiratory Rate (breaths/min): 18 Transportation: Private Auto Blood Pressure (mmHg): 118/69 Accompanied By: alone Schedule Follow-up Appointment: No Clinical Summary of Care: Patient Declined Electronic Signature(s) Signed: 03/25/2021 9:07:32 PM By: Deon Pilling Previous Signature: 03/22/2021 8:56:11 PM Version By: Deon Pilling Entered By: Deon Pilling on 03/25/2021 21:06:56 -------------------------------------------------------------------------------- Lower Extremity Assessment Details Patient Name: Date of Service: Rodney Lee 03/02/2021 12:30 PM Medical Record Number: [id number] Patient Account Number: [id number] Date of Birth/Sex: Treating RN: 06/26/1941 (79 y.o. [name] Primary Care Beren Yniguez: Simona Huh Other Clinician: Referring Graig Hessling: Treating Adon Gehlhausen/Extender: Joseph Berkshire in Treatment: 1 Electronic Signature(s) Signed: 03/02/2021 4:39:21 PM By: Sandre Kitty Signed: 03/02/2021 5:48:53 PM By: Rhae Hammock RN Entered By: Sandre Kitty on 03/02/2021 13:02:15 -------------------------------------------------------------------------------- Multi Wound Chart Details Patient Name: Date of Service: Rodney Lee. 03/02/2021 12:30 PM Medical Record Number: [id number] Patient Account Number: [id number] Date of Birth/Sex: Treating RN: 18-Feb-1941 (79 y.o. [name], [name] Primary Care Boni Maclellan: Simona Huh Other Clinician: Referring Retina Bernardy: Treating Lizzeth Meder/Extender: Joseph Berkshire in Treatment: 1 Vital Signs Height(in): 69 Pulse(bpm): 55 Weight(lbs): 196 Blood Pressure(mmHg): 112/69 Body Mass Index(BMI): 29 Temperature(F): 98.3 Respiratory Rate(breaths/min): 18 Photos: [1:No Photos Sacrum] [N/A:N/A N/A] Wound Location: [1:Pressure Injury]  [N/A:N/A] Wounding Event: [1:Pressure Ulcer] [N/A:N/A] Primary Etiology: [1:Anemia, Arrhythmia, Congestive Heart N/A] Comorbid History: [1:Failure, Coronary Artery Disease, Hypertension, Myocardial Infarction, Type II Diabetes, Received Chemotherapy 02/15/2021] [N/A:N/A] Date Acquired: [1:1] [N/A:N/A] Weeks of Treatment: [1:Open] [N/A:N/A] Wound Status: [1:0.1x0.1x0.1] [N/A:N/A] Measurements L x W x D (cm) [1:0.008] [N/A:N/A] A (cm) : rea [1:0.001] [N/A:N/A] Volume (cm) : [1:99.40%] [N/A:N/A] % Reduction in A [1:rea: 99.20%] [N/A:N/A] % Reduction in Volume: [1:Category/Stage II] [N/A:N/A] Classification: [1:None Present] [N/A:N/A] Exudate A mount: [1:Distinct, outline attached] [N/A:N/A] Wound Margin: [1:None Present (0%)] [N/A:N/A] Granulation A mount: [1:None Present (0%)] [N/A:N/A] Necrotic A mount: [1:Fascia: No] [N/A:N/A] Exposed Structures: [1:Fat Layer (Subcutaneous  Tissue): No Tendon: No Muscle: No Joint: No Bone: No None] [N/A:N/A] Epithelialization: [1:Debridement - Excisional] [N/A:N/A] Debridement: Pre-procedure Verification/Time Out 13:30 [N/A:N/A] Taken: [1:Lidocaine] [N/A:N/A] Pain Control: [1:Subcutaneous] [N/A:N/A] Tissue Debrided: [1:Skin/Subcutaneous Tissue] [N/A:N/A] Level: [1:0.01] [N/A:N/A] Debridement A (sq cm): [1:rea Curette] [N/A:N/A] Instrument: [1:Minimum] [N/A:N/A] Bleeding: [1:Pressure] [N/A:N/A] Hemostasis Achieved: [1:0] [N/A:N/A] Procedural Pain: [1:0] [N/A:N/A] Post Procedural Pain: [1:Procedure was tolerated well] [N/A:N/A] Debridement Treatment Response: [1:0.1x0.1x0.1] [N/A:N/A] Post Debridement Measurements L x W x D (cm) [1:0.001] [N/A:N/A] Post Debridement Volume: (cm) [1:Category/Stage II] [N/A:N/A] Post Debridement Stage: [1:covered in a dry film o healed versus] [N/A:N/A] Assessment Notes: [1:covered in adherent dry tissue Debridement] [N/A:N/A] Treatment Notes Electronic Signature(s) Signed: 03/02/2021 5:29:17 PM By: Linton Ham MD Signed: 03/02/2021 5:48:53 PM By: Rhae Hammock RN Entered By: Linton Ham on 03/02/2021 13:43:50 -------------------------------------------------------------------------------- Multi-Disciplinary Care Plan Details Patient Name: Date of Service: Rodney Lee. 03/02/2021 12:30 PM Medical Record Number: [id number] Patient Account Number: [id number] Date of Birth/Sex: Treating RN: 1941-08-11 (80 y.o. [name], [name] Primary Care Dwyne Hasegawa: Simona Huh Other Clinician: Referring Breyana Follansbee: Treating Shaindel Sweeten/Extender: Joseph Berkshire in Treatment: 1 Active Inactive Wound/Skin Impairment Nursing Diagnoses: Impaired tissue integrity Knowledge deficit related to ulceration/compromised skin integrity Goals: Patient will have a decrease in wound volume by X% from date: (specify in notes) Date Initiated: 02/23/2021 Target Resolution Date: 03/12/2021 Goal Status: Active Patient/caregiver will verbalize understanding of skin care regimen Date Initiated: 02/23/2021 Target Resolution Date: 03/11/2021 Goal Status: Active Ulcer/skin breakdown will have a volume reduction of 30% by week 4 Date Initiated: 02/23/2021 Target Resolution Date: 03/11/2021 Goal Status: Active Interventions: Assess patient/caregiver ability to obtain necessary supplies Assess patient/caregiver ability to perform ulcer/skin care regimen upon admission and as needed Assess ulceration(s) every visit Provide education on ulcer and skin care Notes: Electronic Signature(s) Signed: 03/02/2021 5:48:53 PM By: Rhae Hammock RN Entered By: Rhae Hammock on 03/02/2021 13:32:58 -------------------------------------------------------------------------------- Pain Assessment Details Patient Name: Date of Service: Rodney Lee 03/02/2021 12:30 PM Medical Record Number: [id number] Patient Account Number: [id number] Date of Birth/Sex: Treating RN: February 13, 1941 (79 y.o. [name] Primary Care Laythan Hayter: Simona Huh Other Clinician: Referring Quisha Mabie: Treating Joshlynn Alfonzo/Extender: Joseph Berkshire in Treatment: 1 Active Problems Location of Pain Severity and Description of Pain Patient Has Paino No Site Locations Pain Management and Medication Current Pain Management: Electronic Signature(s) Signed: 03/02/2021 4:39:21 PM By: Sandre Kitty Signed: 03/02/2021 5:48:53 PM By: Rhae Hammock RN Entered By: Sandre Kitty on 03/02/2021 13:01:51 -------------------------------------------------------------------------------- Patient/Caregiver Education Details Patient Name: Date of Service: Rodney Lee 6/28/2022andnbsp12:30 PM Medical Record Number: [id number] Patient Account Number: [id number] Date of Birth/Gender: Treating RN: [birth year] (80 y.o. [name] Primary Care Physician: Simona Huh Other Clinician: Referring Physician: Treating Physician/Extender: Joseph Berkshire in Treatment: 1 Education Assessment Education Provided To: Patient Education Topics Provided Wound/Skin Impairment: Methods: Explain/Verbal Responses: State content correctly Electronic Signature(s) Signed: 03/02/2021 5:48:53 PM By: Rhae Hammock RN Entered By: Rhae Hammock on 03/02/2021 13:33:11 -------------------------------------------------------------------------------- Wound Assessment Details Patient Name: Date of Service: Rodney Lee 03/02/2021 12:30 PM Medical Record Number: [id number] Patient Account Number: [id number] Date of Birth/Sex: Treating RN: 1941-02-26 (79 y.o. [name] Primary Care Japheth Diekman: Simona Huh Other Clinician: Referring Shadasia Oldfield: Treating Aundreya Souffrant/Extender: Joseph Berkshire in Treatment: 1 Wound Status Wound Number: 1 Primary Pressure Ulcer Etiology: Wound Location: Sacrum Wound  Open Wounding Event: Pressure Injury Status: Date Acquired: 02/15/2021  Comorbid Anemia, Arrhythmia, Congestive Heart Failure, Coronary Artery Weeks Of Treatment: 1 History: Disease, Hypertension, Myocardial Infarction, Type II Diabetes, Clustered Wound: No Received Chemotherapy Photos Wound Measurements Length: (cm) 0.1 Width: (cm) 0.1 Depth: (cm) 0.1 Area: (cm) 0.008 Volume: (cm) 0.001 % Reduction in Area: 99.4% % Reduction in Volume: 99.2% Epithelialization: None Tunneling: No Undermining: No Wound Description Classification: Category/Stage II Wound Margin: Distinct, outline attached Exudate Amount: None Present Foul Odor After Cleansing: No Slough/Fibrino No Wound Bed Granulation Amount: None Present (0%) Exposed Structure Necrotic Amount: None Present (0%) Fascia Exposed: No Fat Layer (Subcutaneous Tissue) Exposed: No Tendon Exposed: No Muscle Exposed: No Joint Exposed: No Bone Exposed: No Assessment Notes covered in a dry film o healed versus covered in adherent dry tissue Electronic Signature(s) Signed: 03/03/2021 10:30:26 AM By: Sandre Kitty Signed: 03/03/2021 6:02:58 PM By: Rhae Hammock RN Previous Signature: 03/02/2021 4:39:21 PM Version By: Sandre Kitty Previous Signature: 03/02/2021 4:39:21 PM Version By: Sandre Kitty Previous Signature: 03/02/2021 5:48:53 PM Version By: Rhae Hammock RN Entered By: Sandre Kitty on 03/03/2021 09:20:55 -------------------------------------------------------------------------------- Vitals Details Patient Name: Date of Service: Rodney Lee. 03/02/2021 12:30 PM Medical Record Number: [id number] Patient Account Number: [id number] Date of Birth/Sex: Treating RN: Jun 14, 1941 (79 y.o. [name], [name] Primary Care Mouna Yager: Simona Huh Other Clinician: Referring Shae Augello: Treating Jaelle Campanile/Extender: Joseph Berkshire in Treatment: 1 Vital Signs Time Taken:  13:01 Temperature (F): 98.3 Height (in): 69 Pulse (bpm): 67 Weight (lbs): 196 Respiratory Rate (breaths/min): 18 Body Mass Index (BMI): 28.9 Blood Pressure (mmHg): 112/69 Reference Range: 80 - 120 mg / dl Electronic Signature(s) Signed: 03/02/2021 4:39:21 PM By: Sandre Kitty Entered By: Sandre Kitty on 03/02/2021 13:01:43

## 2021-03-25 NOTE — Telephone Encounter (Signed)
Refills has been sent to the pharmacy. 

## 2021-03-31 ENCOUNTER — Inpatient Hospital Stay: Payer: Medicare HMO

## 2021-03-31 ENCOUNTER — Other Ambulatory Visit: Payer: Self-pay

## 2021-03-31 ENCOUNTER — Other Ambulatory Visit: Payer: Self-pay | Admitting: *Deleted

## 2021-03-31 DIAGNOSIS — C8510 Unspecified B-cell lymphoma, unspecified site: Secondary | ICD-10-CM

## 2021-03-31 DIAGNOSIS — Z5112 Encounter for antineoplastic immunotherapy: Secondary | ICD-10-CM | POA: Diagnosis not present

## 2021-03-31 DIAGNOSIS — Z5111 Encounter for antineoplastic chemotherapy: Secondary | ICD-10-CM | POA: Diagnosis not present

## 2021-03-31 DIAGNOSIS — Z95828 Presence of other vascular implants and grafts: Secondary | ICD-10-CM

## 2021-03-31 DIAGNOSIS — D6481 Anemia due to antineoplastic chemotherapy: Secondary | ICD-10-CM | POA: Diagnosis not present

## 2021-03-31 LAB — CBC WITH DIFFERENTIAL (CANCER CENTER ONLY)
Abs Immature Granulocytes: 0.31 10*3/uL — ABNORMAL HIGH (ref 0.00–0.07)
Basophils Absolute: 0 10*3/uL (ref 0.0–0.1)
Basophils Relative: 0 %
Eosinophils Absolute: 0 10*3/uL (ref 0.0–0.5)
Eosinophils Relative: 0 %
HCT: 19.8 % — ABNORMAL LOW (ref 39.0–52.0)
Hemoglobin: 6.4 g/dL — CL (ref 13.0–17.0)
Immature Granulocytes: 3 %
Lymphocytes Relative: 5 %
Lymphs Abs: 0.4 10*3/uL — ABNORMAL LOW (ref 0.7–4.0)
MCH: 34.2 pg — ABNORMAL HIGH (ref 26.0–34.0)
MCHC: 32.3 g/dL (ref 30.0–36.0)
MCV: 105.9 fL — ABNORMAL HIGH (ref 80.0–100.0)
Monocytes Absolute: 1.8 10*3/uL — ABNORMAL HIGH (ref 0.1–1.0)
Monocytes Relative: 20 %
Neutro Abs: 6.7 10*3/uL (ref 1.7–7.7)
Neutrophils Relative %: 72 %
Platelet Count: 18 10*3/uL — ABNORMAL LOW (ref 150–400)
RBC: 1.87 MIL/uL — ABNORMAL LOW (ref 4.22–5.81)
RDW: 21.4 % — ABNORMAL HIGH (ref 11.5–15.5)
WBC Count: 9.2 10*3/uL (ref 4.0–10.5)
nRBC: 0 % (ref 0.0–0.2)

## 2021-03-31 LAB — CMP (CANCER CENTER ONLY)
ALT: 17 U/L (ref 0–44)
AST: 28 U/L (ref 15–41)
Albumin: 2.4 g/dL — ABNORMAL LOW (ref 3.5–5.0)
Alkaline Phosphatase: 100 U/L (ref 38–126)
Anion gap: 7 (ref 5–15)
BUN: 32 mg/dL — ABNORMAL HIGH (ref 8–23)
CO2: 23 mmol/L (ref 22–32)
Calcium: 8.1 mg/dL — ABNORMAL LOW (ref 8.9–10.3)
Chloride: 106 mmol/L (ref 98–111)
Creatinine: 2.39 mg/dL — ABNORMAL HIGH (ref 0.61–1.24)
GFR, Estimated: 27 mL/min — ABNORMAL LOW (ref >60)
Glucose, Bld: 283 mg/dL — ABNORMAL HIGH (ref 70–99)
Potassium: 4.9 mmol/L (ref 3.5–5.1)
Sodium: 136 mmol/L (ref 135–145)
Total Bilirubin: 0.4 mg/dL (ref 0.3–1.2)
Total Protein: 5.3 g/dL — ABNORMAL LOW (ref 6.5–8.1)

## 2021-03-31 LAB — SAMPLE TO BLOOD BANK

## 2021-03-31 LAB — LACTATE DEHYDROGENASE: LDH: 262 U/L — ABNORMAL HIGH (ref 98–192)

## 2021-03-31 LAB — PREPARE RBC (CROSSMATCH)

## 2021-03-31 LAB — URIC ACID: Uric Acid, Serum: 2.4 mg/dL — ABNORMAL LOW (ref 3.7–8.6)

## 2021-03-31 MED ORDER — HEPARIN SOD (PORK) LOCK FLUSH 100 UNIT/ML IV SOLN
500.0000 [IU] | Freq: Once | INTRAVENOUS | Status: AC
  Administered 2021-03-31: 500 [IU]
  Filled 2021-03-31: qty 5

## 2021-03-31 MED ORDER — ACETAMINOPHEN 325 MG PO TABS
ORAL_TABLET | ORAL | Status: AC
  Filled 2021-03-31: qty 2

## 2021-03-31 MED ORDER — SODIUM CHLORIDE 0.9% FLUSH
10.0000 mL | INTRAVENOUS | Status: AC | PRN
  Administered 2021-03-31: 10 mL
  Filled 2021-03-31: qty 10

## 2021-03-31 MED ORDER — SODIUM CHLORIDE 0.9% IV SOLUTION
250.0000 mL | Freq: Once | INTRAVENOUS | Status: AC
  Administered 2021-03-31: 250 mL via INTRAVENOUS
  Filled 2021-03-31: qty 250

## 2021-03-31 MED ORDER — ACETAMINOPHEN 325 MG PO TABS
650.0000 mg | ORAL_TABLET | Freq: Once | ORAL | Status: AC
  Administered 2021-03-31: 650 mg via ORAL

## 2021-03-31 MED ORDER — SODIUM CHLORIDE 0.9% FLUSH
10.0000 mL | Freq: Once | INTRAVENOUS | Status: AC
  Administered 2021-03-31: 10 mL
  Filled 2021-03-31: qty 10

## 2021-03-31 MED ORDER — HEPARIN SOD (PORK) LOCK FLUSH 100 UNIT/ML IV SOLN
250.0000 [IU] | INTRAVENOUS | Status: AC | PRN
  Administered 2021-03-31: 250 [IU]
  Filled 2021-03-31: qty 5

## 2021-03-31 NOTE — Patient Instructions (Signed)
https://www.[email or web address]/donate-blood/blood-donation-process/what-happens-to-donated-blood/blood-transfusions/types-of-blood-transfusions.html"> https://www.[facility].org/donate-blood/blood-donation-process/what-happens-to-donated-blood/blood-transfusions/risks-complications.html">  Blood Transfusion, Adult, Care After This sheet gives you information about how to care for yourself after your procedure. Your health care provider may also give you more specific instructions. If you have problems or questions, contact your health careprovider. What can I expect after the procedure? After the procedure, it is common to have: Bruising and soreness where the IV was inserted. A fever or chills on the day of the procedure. This may be your body's response to the new blood cells received. A headache. Follow these instructions at home: IV insertion site care     Follow instructions from your health care provider about how to take care of your IV insertion site. Make sure you: Wash your hands with soap and water before and after you change your bandage (dressing). If soap and water are not available, use hand sanitizer. Change your dressing as told by your health care provider. Check your IV insertion site every day for signs of infection. Check for: Redness, swelling, or pain. Bleeding from the site. Warmth. Pus or a bad smell. General instructions Take over-the-counter and prescription medicines only as told by your health care provider. Rest as told by your health care provider. Return to your normal activities as told by your health care provider. Keep all follow-up visits as told by your health care provider. This is important. Contact a health care provider if: You have itching or red, swollen areas of skin (hives). You feel anxious. You feel weak after doing your normal activities. You have redness, swelling, warmth, or pain around the IV insertion site. You have blood coming  from the IV insertion site that does not stop with pressure. You have pus or a bad smell coming from your IV insertion site. Get help right away if: You have symptoms of a serious allergic or immune system reaction, including: Trouble breathing or shortness of breath. Swelling of the face or feeling flushed. Fever or chills. Pain in the head, back, or chest. Dark urine or blood in the urine. Widespread rash. Fast heartbeat. Feeling dizzy or light-headed. If you receive your blood transfusion in an outpatient setting, you will betold whom to contact to report any reactions. These symptoms may represent a serious problem that is an emergency. Do not wait to see if the symptoms will go away. Get medical help right away. Call your local emergency services (911 in the [place] Do not drive yourself to the hospital. Summary Bruising and tenderness around the IV insertion site are common. Check your IV insertion site every day for signs of infection. Rest as told by your health care provider. Return to your normal activities as told by your health care provider. Get help right away for symptoms of a serious allergic or immune system reaction to blood transfusion. This information is not intended to replace advice given to you by your health care provider. Make sure you discuss any questions you have with your healthcare provider. Document Revised: 02/14/2019 Document Reviewed: 02/14/2019 [employer]vier Patient Education  2022 [employer]  

## 2021-04-01 ENCOUNTER — Other Ambulatory Visit: Payer: Self-pay | Admitting: Hematology and Oncology

## 2021-04-01 ENCOUNTER — Telehealth: Payer: Self-pay | Admitting: Cardiovascular Disease

## 2021-04-01 LAB — BPAM PLATELET PHERESIS
Blood Product Expiration Date: 202207282359
ISSUE DATE / TIME: 202207271304
Unit Type and Rh: 5100

## 2021-04-01 LAB — BPAM RBC
Blood Product Expiration Date: 202208302359
Blood Product Expiration Date: 202208302359
ISSUE DATE / TIME: 202207271301
ISSUE DATE / TIME: 202207271301
Unit Type and Rh: 5100
Unit Type and Rh: 5100

## 2021-04-01 LAB — PREPARE PLATELET PHERESIS: Unit division: 0

## 2021-04-01 LAB — TYPE AND SCREEN
ABO/RH(D): O POS
Antibody Screen: NEGATIVE
Unit division: 0
Unit division: 0

## 2021-04-01 MED ORDER — AMIODARONE HCL 200 MG PO TABS: 200.0000 mg | ORAL_TABLET | Freq: Every day | ORAL | 0 refills | Status: DC

## 2021-04-01 NOTE — Telephone Encounter (Signed)
Called [facility] regarding RX

## 2021-04-01 NOTE — Telephone Encounter (Signed)
*  STAT* If patient is at the pharmacy, call can be transferred to refill team.   1. Which medications need to be refilled? (please list name of each medication and dose if known)   amiodarone (PACERONE) 200 MG tablet  2. Which pharmacy/location (including street and city if local pharmacy) is medication to be sent to? [place], [place] - [address]  3. Do they need a 30 day or 90 day supply?  A week supply to last until mail service arrives.   Completely out of medication.

## 2021-04-02 ENCOUNTER — Telehealth: Payer: Self-pay | Admitting: *Deleted

## 2021-04-02 ENCOUNTER — Other Ambulatory Visit: Payer: Self-pay

## 2021-04-02 ENCOUNTER — Inpatient Hospital Stay (HOSPITAL_BASED_OUTPATIENT_CLINIC_OR_DEPARTMENT_OTHER): Payer: Medicare HMO | Admitting: Hematology and Oncology

## 2021-04-02 VITALS — BP 100/51 | HR 66 | Temp 98.3°F | Resp 18 | Wt 192.9 lb

## 2021-04-02 DIAGNOSIS — C8510 Unspecified B-cell lymphoma, unspecified site: Secondary | ICD-10-CM

## 2021-04-02 DIAGNOSIS — Z5111 Encounter for antineoplastic chemotherapy: Secondary | ICD-10-CM | POA: Diagnosis not present

## 2021-04-02 DIAGNOSIS — Z5112 Encounter for antineoplastic immunotherapy: Secondary | ICD-10-CM | POA: Diagnosis not present

## 2021-04-02 DIAGNOSIS — D6481 Anemia due to antineoplastic chemotherapy: Secondary | ICD-10-CM | POA: Diagnosis not present

## 2021-04-02 NOTE — Progress Notes (Signed)
Pt here for dressing change on his right elbow. Wife hesitant to change this first time. Dressing removed. Minimal bleeding noted. Skin remains peeled back on elbow. Noa ctive bleeding. No evidence of infection. Wound is 4cm x 5cm  . One much smaller area denuded of skin 1cm x 1cm. Small amount of bleeding, easily stopped with mod pressure.  Wound rinsed with saline and patted dry with gauze. Covered with 2 vaseline gauze and 2 telfa pads.  1 abd pad and then wrapped in kerlex , secured with paper tape.  Mild swelling to hand. Advised to keep arm/hand elevated on a pillow. Wife instructed on dressing change for the weekend. Supplies provided.  She is to call on Monday if she has any concerns on dressing change. Advised that We can change dressing here on Wednesday when he comes back for labs. He states his arm feels better and he feels better overall after receiving blood transfusion and platelet transfusion on 7/2/722. Dr. Lorenso Courier in to see pt.

## 2021-04-02 NOTE — Telephone Encounter (Signed)
Received call from pt's wife, Hassan Rowan. She states she has not heard from [facility] about an appt for her husband after he sustained a skin tear on his right elbow.  Call Made to [facility]. They do not have any appts available until the end of August.  Call abck to wife. Advised her of the above. She states her husband's hand is a bit swollen and she is worried about that as well. She would like to have his elbow dressing changed by this nurse and have Dr. Lorenso Courier evaluate his hand and arm.  Appt given to her for this afternoon at 3:20 pm  She expressed much gratitude for  [place] being able to see him this afternoon.  Dr. Lorenso Courier aware.  Scheduling message sent.

## 2021-04-06 ENCOUNTER — Encounter: Payer: Self-pay | Admitting: Hematology and Oncology

## 2021-04-06 NOTE — Progress Notes (Signed)
[place] [place] [phone]   [phone]  PROGRESS NOTE  Patient Care Team: Janith Lima, MD as PCP - General (Internal Medicine) Evans Lance, MD as PCP - Electrophysiology (Cardiology) [name], MD as PCP - Cardiology (Cardiology)  Hematological/Oncological History # High Grade B Cell Lymphoma, Stage III/IV 12/10/2020: CT Abdomen/Pelvis shows a soft tissue mass at 6.7 cm with soft tissue stranding.  12/14/2020: punch biopsy shows findings consistent with a high grade B cell lymphoma.  01/07/2021: establish care with Dr. Lorenso Courier  01/21/2021: PET CT scan shows diffuse disease with anterior abdominal wall lesion, pulmonary nodule, lesions along the cortex of the left/right kidney.  02/04/2021: Cycle 1 Day 1 of R-GCVP 02/25/2021: Cycle 2 Day 1 of R-GCVP. Held Cycle 2 Day 8 gemcitabine due to marked hyperglycemia. Patient admitted.  03/17/2021: Cycle 3 Day 1 of R-GCVP  Interval History:  Rodney Lee 80 y.o. male with medical history significant for advanced stage high grade B cell lymphoma who presents for an urgent follow up visit. The patient's last visit was on 03/17/2021. In the interim since the last visit he developed a had a fall and injured his right arm with a skin tear.  On exam today Rodney Lee is accompanied by his wife.  He reports that he had another fall from standing when he attempted to go to the bathroom without asking for assistance.  He developed a skin tear in his right arm which has continued bleeding.  He presents today to evaluate this bleeding and to get his wound redressed.  Wound care clinic is unable to see the patient in a timely fashion but fortunately my nurse has training in wound care and is able to provide the patient with supplies necessary to redress the wound and management.  No signs of active infection.  Bleeding appears modest.  The patient denies any lightheadedness or dizziness.  He notes that he will ask for assistance  when trying to walk to the bathroom.  He currently denies any fevers, chills, sweats, nausea, vomiting or diarrhea.  He is otherwise tolerating chemotherapy quite well.  A full 10 point ROS is listed below.  MEDICAL HISTORY:  Past Medical History:  Diagnosis Date   Atrial fibrillation ([place])    CHADS2Vasc is at least 5, on Xarelto   CAD (coronary artery disease)    cardiomyopathy    MDT ICD, Dr. Lovena Le 2009   CHF (congestive heart failure) ([place])    Diabetes mellitus    Exogenous obesity    Hyperlipidemia    Hypertension    Hypothyroidism    MI, old 77   ANTEROLATERAL, PTCA LAD per pt   Sinus bradycardia     SURGICAL HISTORY: Past Surgical History:  Procedure Laterality Date   ANGIOPLASTY     CARDIAC CATHETERIZATION  03/13/1994   ACUTE MI WITH TOTAL OCCLUSION OF MID LAD. REPERFUSION AFTER CROSSING WITH A GUIDEWIRE. POOR RIGHT TO LEFT COLLATERAL FLOW. Pt states had PTCA   CARDIAC DEFIBRILLATOR PLACEMENT     CARDIOVASCULAR STRESS TEST  03/11/2008   EF 30%. NO REVERSIBLE ISCHEMIA   EP IMPLANTABLE DEVICE N/A 09/28/2015   Procedure:  ICD Generator Changeout;  Surgeon: Evans Lance, MD;  Location: [facility];  Service: Cardiovascular;  Laterality: N/A;   IR IMAGING GUIDED PORT INSERTION  01/28/2021   US ECHOCARDIOGRAPHY  04/26/2010   EF 35-40%. MODERATE LVH WITH MODERATE GLOBAL LV SYSTOLIC DYSFUNCTION AND IMPAIRED RELAXATION. MILD AORTIC SCLEROSIS WITHOUT STENOSIS. NORMAL  PULMONARY ARTERY PRESSURE.   US ECHOCARDIOGRAPHY  10/07/2004   EF 30-35%    SOCIAL HISTORY: Social History   Socioeconomic History   Marital status: Married    Spouse name: Not on file   Number of children: Not on file   Years of education: Not on file   Highest education level: Not on file  Occupational History   Occupation: Retired  Tobacco Use   Smoking status: Never   Smokeless tobacco: Never  Vaping Use   Vaping Use: Never used  Substance and Sexual Activity   Alcohol use: No   Drug use: No    Sexual activity: Not on file  Other Topics Concern   Not on file  Social History Narrative   Lives with wife   Social Determinants of Health   Financial Resource Strain: Not on file  Food Insecurity: Not on file  Transportation Needs: Not on file  Physical Activity: Not on file  Stress: Not on file  Social Connections: Not on file  Intimate Partner Violence: Not on file    FAMILY HISTORY: Family History  Problem Relation Age of Onset   Heart disease Mother    Heart attack Mother     ALLERGIES:  is allergic to canagliflozin and lipitor [atorvastatin calcium].  MEDICATIONS:  Current Outpatient Medications  Medication Sig Dispense Refill   allopurinol (ZYLOPRIM) 300 MG tablet Take 1 tablet (300 mg total) by mouth daily. 90 tablet 1   amiodarone (PACERONE) 200 MG tablet Take 1 tablet (200 mg total) by mouth daily. 90 tablet 0   blood glucose meter kit and supplies Dispense based on patient and insurance preference. Use up to four times daily as directed. (FOR ICD-10 E10.9, E11.9). 1 each 0   carvedilol (COREG) 6.25 MG tablet TAKE 1 TABLET BY MOUTH  TWICE DAILY WITH A MEAL 180 tablet 2   citalopram (CELEXA) 20 MG tablet Take 20 mg by mouth daily.     glimepiride (AMARYL) 1 MG tablet Take 1 mg by mouth daily with breakfast.     insulin lispro (HUMALOG KWIKPEN) 100 UNIT/ML KwikPen Use following sliding scale provided, at meal times as long as eating > 50% of your meal, up to three times daily. 15 mL 0   Insulin Pen Needle 31G X 5 MM MISC Use with insulin up to three times daily with meals 100 each 0   isosorbide mononitrate (IMDUR) 60 MG 24 hr tablet Take 60 mg in the morning and 30 mg (half a tablet) in the evening. 135 tablet 3   levothyroxine (SYNTHROID) 125 MCG tablet Take 125 mcg by mouth daily.     lidocaine-prilocaine (EMLA) cream APPLY  CREAM TOPICALLY AS NEEDED 30 g 0   Multiple Vitamin (MULTIVITAMIN) tablet Take 1 tablet by mouth daily.     nitroGLYCERIN (NITROSTAT) 0.4  MG SL tablet Place 1 tablet (0.4 mg total) under the tongue every 5 (five) minutes as needed for chest pain. 25 tablet 2   ondansetron (ZOFRAN) 8 MG tablet Take 1 tablet (8 mg total) by mouth every 8 (eight) hours as needed for nausea or vomiting. 30 tablet 0   ONETOUCH ULTRA test strip USE 1 STRIP TO CHECK GLUCOSE AS DIRECTED     predniSONE (DELTASONE) 20 MG tablet Take 3 tablets (60 mg total) by mouth as directed. Take 60 mg  ( 3 tablets) by mouth every morning on Days 1 to 5 of chemotherapy. 15 tablet 5   prochlorperazine (COMPAZINE) 10 MG tablet TAKE 1  TABLET BY MOUTH EVERY 6 HOURS AS NEEDED FOR NAUSEA OR  VOMITING 30 tablet 0   repaglinide (PRANDIN) 2 MG tablet Take 2 mg by mouth 2 (two) times daily before a meal.     rosuvastatin (CRESTOR) 40 MG tablet Take 40 mg by mouth daily.     vitamin C (ASCORBIC ACID) 500 MG tablet Take 500 mg by mouth daily.     No current facility-administered medications for this visit.    REVIEW OF SYSTEMS:   Constitutional: ( - ) fevers, ( - )  chills , ( - ) night sweats Eyes: ( - ) blurriness of vision, ( - ) double vision, ( - ) watery eyes Ears, nose, mouth, throat, and face: ( - ) mucositis, ( - ) sore throat Respiratory: ( - ) cough, ( - ) dyspnea, ( - ) wheezes Cardiovascular: ( - ) palpitation, ( - ) chest discomfort, ( - ) lower extremity swelling Gastrointestinal:  ( - ) nausea, ( - ) heartburn, ( - ) change in bowel habits Skin: ( - ) abnormal skin rashes Lymphatics: ( - ) new lymphadenopathy, ( - ) easy bruising Neurological: ( - ) numbness, ( - ) tingling, ( - ) new weaknesses Behavioral/Psych: ( - ) mood change, ( - ) new changes  All other systems were reviewed with the patient and are negative.  PHYSICAL EXAMINATION: ECOG PERFORMANCE STATUS: 2 - Symptomatic, <50% confined to bed  Vitals:   04/02/21 1542  BP: (!) 100/51  Pulse: 66  Resp: 18  Temp: 98.3 F (36.8 C)  SpO2: 98%   Filed Weights   04/02/21 1542  Weight: 192 lb 14.4  oz (87.5 kg)    GENERAL: well appearing elderly Caucasian male. alert, no distress and comfortable SKIN: prior lesion on back on head resolved. Otherwise skin color, texture, turgor are normal, no rashes or significant lesions EYES: conjunctiva are pink and non-injected, sclera clear LUNGS: clear to auscultation and percussion with normal breathing effort HEART: regular rate & rhythm and no murmurs and no lower extremity edema ABDOMEN: Smaller left-sided lesion with some necrosis and drainage, but no overt signs of infection. Appears to be improving.  Consistent with lymphoma noted on PET CT scan. Musculoskeletal: no cyanosis of digits and no clubbing  PSYCH: alert & oriented x 3, fluent speech NEURO: no focal motor/sensory deficits  LABORATORY DATA:  I have reviewed the data as listed CBC Latest Ref Rng & Units 03/31/2021 03/24/2021 03/17/2021  WBC 4.0 - 10.5 K/uL 9.2 1.4(L) 5.2  Hemoglobin 13.0 - 17.0 g/dL 6.4(LL) 8.3(L) 8.2(L)  Hematocrit 39.0 - 52.0 % 19.8(L) 25.2(L) 25.3(L)  Platelets 150 - 400 K/uL 18(L) 90(L) 135(L)    CMP Latest Ref Rng & Units 03/31/2021 03/24/2021 03/17/2021  Glucose 70 - 99 mg/dL 283(H) 290(H) 224(H)  BUN 8 - 23 mg/dL 32(H) 35(H) 24(H)  Creatinine 0.61 - 1.24 mg/dL 2.39(H) 1.83(H) 2.41(H)  Sodium 135 - 145 mmol/L 136 137 137  Potassium 3.5 - 5.1 mmol/L 4.9 4.6 4.7  Chloride 98 - 111 mmol/L 106 102 104  CO2 22 - 32 mmol/L 23 28 27   Calcium 8.9 - 10.3 mg/dL 8.1(L) 8.4(L) 8.5(L)  Total Protein 6.5 - 8.1 g/dL 5.3(L) 5.7(L) 5.7(L)  Total Bilirubin 0.3 - 1.2 mg/dL 0.4 0.7 0.4  Alkaline Phos 38 - 126 U/L 100 63 70  AST 15 - 41 U/L 28 25 25   ALT 0 - 44 U/L 17 23 16     RADIOGRAPHIC STUDIES: I have  personally reviewed the radiological images as listed and agreed with the findings in the report: involvement of the left abdomen, the kidney's bilaterally, and pulmonary nodule.   No results found.   ASSESSMENT & PLAN Rodney Lee 80 y.o. male with medical history  significant for advanced stage high grade B cell lymphoma who presents for a follow up visit.   Given Rodney Lee's complicated medical history with cardiac dysfunction, renal dysfunction, and advanced age I am concerned that he would not be able to tolerate anthracycline-based therapy such as R mini CHOP.  As such the best regimen to proceed with given his comorbidities would be R-GCVP which is not toxic to the heart or filter to the kidney and can be used in patients with advanced age.  At this time we recommend proceeding with R-GCVP chemotherapy given his advanced age, cardiac dysfunction, and poor renal function.  Previously discussed the risks and benefits of treatment including nausea, vomiting, diarrhea, neuropathy, and allergic reactions.  Patient has an IPI showing he has high risk with lower rates of success with these treatment regimens.  Patient voices understanding of the risks and benefits of treatment was willing to proceed.  We will plan for 21 day cycles x 6 cycles. Rituximab 335m/m2 IV on Day 1, Cyclophosphamide 750 mg /m2 on Day 1, Vincristine 1.457mm2 (capped at 74m65mon Day 1, and Prednisone 18m28my 1-5 (protocol calls for prednisolone, but will adjust to lower dose prednisone). The Gemcitabine is to be administered on Day 1-8 and will be 750 mg/m2 IV over 30 minutes once per day on days 1 & 8 for Cycle 1, 875 mg/m2 IV over 30 minutes once per day on days 1 & 8 for Cycle 2, and 1000 mg/m2 IV over 30 minutes once per day on days 1 & 8 for Cycle 3 and onward.   # High Grade B Cell Lymphoma, Stage III/IV --PET CT scan findings consistent with at least Stage III disease, with organ involvement most likely a Stage IV --avoiding anthracycline therapy and alternative regimens given his poor cardiac and renal function -- 02/04/2021 was Cycle 1 Day 1 of R-GCVP --Cycle 2 Day 8 was held due to hyperglycemia.  --prior cycle was Cycle 3 Day 1 of R-GCVP --plan for restarting PET CT scan prior to  Cycle 4.  --RTC for Cycle 4 of R-GCVP  # Skin Tear # Fall from Standing -- The patient has all he needs to keep himself from falling including a bedside commode, walker, and family support.  His falls occur when he attempts to walk to the restroom by himself without assistance.  Encourage him to ask for help. --Bleeding is modest and appears to be under control with the current bandages --No signs of infection --Today redressed the wound and provided the patient with supplies necessary to redress again over the weekend. --Continue to monitor.  #Chemotherapy Induced Anemia #Chemotherapy Induced Thrombocytopenia --Hgb 6.4 on 03/31/2021. Received Plt and 2 units PRBC --Plt 18 on 03/31/2021.  --continue to monitor with transfusion support as needed.   #Supportive Care -- chemotherapy education complete -- port placed -- zofran 8mg 11m PRN and compazine 10mg 77m6H for nausea -- allopurinol 300mg P67mily for TLS prophylaxis -- EMLA cream for port -- no pain medication required at this time.   No orders of the defined types were placed in this encounter.  All questions were answered. The patient knows to call the clinic with any problems, questions or concerns.  A  total of more than 25 minutes were spent on this encounter with face-to-face time and non-face-to-face time, including preparing to see the patient, ordering tests and/or medications, counseling the patient and coordination of care as outlined above.   Rodney Peoples, MD Department of Hematology/Oncology [place] [facility] Phone: [phone] Pager: [phone] Email: [email or web address]  04/06/2021 2:09 PM

## 2021-04-07 ENCOUNTER — Inpatient Hospital Stay: Payer: Medicare HMO

## 2021-04-07 ENCOUNTER — Encounter: Payer: Self-pay | Admitting: Hematology and Oncology

## 2021-04-07 ENCOUNTER — Inpatient Hospital Stay: Payer: Medicare HMO | Attending: Hematology and Oncology | Admitting: Hematology and Oncology

## 2021-04-07 ENCOUNTER — Other Ambulatory Visit: Payer: Self-pay

## 2021-04-07 VITALS — BP 109/54 | HR 61 | Temp 97.8°F | Resp 18

## 2021-04-07 VITALS — BP 95/50 | HR 65 | Temp 97.6°F | Resp 20 | Ht 71.0 in | Wt 188.1 lb

## 2021-04-07 DIAGNOSIS — C8510 Unspecified B-cell lymphoma, unspecified site: Secondary | ICD-10-CM

## 2021-04-07 DIAGNOSIS — Z5189 Encounter for other specified aftercare: Secondary | ICD-10-CM | POA: Insufficient documentation

## 2021-04-07 DIAGNOSIS — Z95828 Presence of other vascular implants and grafts: Secondary | ICD-10-CM | POA: Diagnosis not present

## 2021-04-07 DIAGNOSIS — Z5112 Encounter for antineoplastic immunotherapy: Secondary | ICD-10-CM | POA: Diagnosis not present

## 2021-04-07 DIAGNOSIS — Z5111 Encounter for antineoplastic chemotherapy: Secondary | ICD-10-CM | POA: Insufficient documentation

## 2021-04-07 LAB — CBC WITH DIFFERENTIAL (CANCER CENTER ONLY)
Abs Immature Granulocytes: 0.15 K/uL — ABNORMAL HIGH (ref 0.00–0.07)
Basophils Absolute: 0 K/uL (ref 0.0–0.1)
Basophils Relative: 1 %
Eosinophils Absolute: 0.1 K/uL (ref 0.0–0.5)
Eosinophils Relative: 1 %
HCT: 27.6 % — ABNORMAL LOW (ref 39.0–52.0)
Hemoglobin: 9 g/dL — ABNORMAL LOW (ref 13.0–17.0)
Immature Granulocytes: 2 %
Lymphocytes Relative: 18 %
Lymphs Abs: 1.3 K/uL (ref 0.7–4.0)
MCH: 31.9 pg (ref 26.0–34.0)
MCHC: 32.6 g/dL (ref 30.0–36.0)
MCV: 97.9 fL (ref 80.0–100.0)
Monocytes Absolute: 1.1 K/uL — ABNORMAL HIGH (ref 0.1–1.0)
Monocytes Relative: 15 %
Neutro Abs: 4.5 K/uL (ref 1.7–7.7)
Neutrophils Relative %: 63 %
Platelet Count: 125 K/uL — ABNORMAL LOW (ref 150–400)
RBC: 2.82 MIL/uL — ABNORMAL LOW (ref 4.22–5.81)
RDW: 26.6 % — ABNORMAL HIGH (ref 11.5–15.5)
WBC Count: 7.1 K/uL (ref 4.0–10.5)
nRBC: 0 % (ref 0.0–0.2)

## 2021-04-07 LAB — CMP (CANCER CENTER ONLY)
ALT: 19 U/L (ref 0–44)
AST: 30 U/L (ref 15–41)
Albumin: 2.6 g/dL — ABNORMAL LOW (ref 3.5–5.0)
Alkaline Phosphatase: 82 U/L (ref 38–126)
Anion gap: 8 (ref 5–15)
BUN: 27 mg/dL — ABNORMAL HIGH (ref 8–23)
CO2: 23 mmol/L (ref 22–32)
Calcium: 8.1 mg/dL — ABNORMAL LOW (ref 8.9–10.3)
Chloride: 106 mmol/L (ref 98–111)
Creatinine: 2.19 mg/dL — ABNORMAL HIGH (ref 0.61–1.24)
GFR, Estimated: 30 mL/min — ABNORMAL LOW
Glucose, Bld: 261 mg/dL — ABNORMAL HIGH (ref 70–99)
Potassium: 4.7 mmol/L (ref 3.5–5.1)
Sodium: 137 mmol/L (ref 135–145)
Total Bilirubin: 0.4 mg/dL (ref 0.3–1.2)
Total Protein: 5.1 g/dL — ABNORMAL LOW (ref 6.5–8.1)

## 2021-04-07 LAB — URIC ACID: Uric Acid, Serum: 2.1 mg/dL — ABNORMAL LOW (ref 3.7–8.6)

## 2021-04-07 LAB — LACTATE DEHYDROGENASE: LDH: 208 U/L — ABNORMAL HIGH (ref 98–192)

## 2021-04-07 MED ORDER — SODIUM CHLORIDE 0.9% FLUSH
10.0000 mL | Freq: Once | INTRAVENOUS | Status: AC
  Administered 2021-04-07: 10 mL
  Filled 2021-04-07: qty 10

## 2021-04-07 MED ORDER — DIPHENHYDRAMINE HCL 25 MG PO CAPS
50.0000 mg | ORAL_CAPSULE | Freq: Once | ORAL | Status: AC
  Administered 2021-04-07: 50 mg via ORAL

## 2021-04-07 MED ORDER — SODIUM CHLORIDE 0.9 % IV SOLN
10.0000 mg | Freq: Once | INTRAVENOUS | Status: AC
  Administered 2021-04-07: 10 mg via INTRAVENOUS
  Filled 2021-04-07: qty 10

## 2021-04-07 MED ORDER — PALONOSETRON HCL INJECTION 0.25 MG/5ML
0.2500 mg | Freq: Once | INTRAVENOUS | Status: AC
  Administered 2021-04-07: 0.25 mg via INTRAVENOUS

## 2021-04-07 MED ORDER — SODIUM CHLORIDE 0.9% FLUSH
10.0000 mL | INTRAVENOUS | Status: DC | PRN
Start: 2021-04-07 — End: 2021-04-07
  Administered 2021-04-07: 10 mL
  Filled 2021-04-07: qty 10

## 2021-04-07 MED ORDER — ACETAMINOPHEN 325 MG PO TABS
650.0000 mg | ORAL_TABLET | Freq: Once | ORAL | Status: AC
  Administered 2021-04-07: 650 mg via ORAL

## 2021-04-07 MED ORDER — DIPHENHYDRAMINE HCL 25 MG PO CAPS
ORAL_CAPSULE | ORAL | Status: AC
  Filled 2021-04-07: qty 2

## 2021-04-07 MED ORDER — SODIUM CHLORIDE 0.9 % IV SOLN
Freq: Once | INTRAVENOUS | Status: AC
  Filled 2021-04-07: qty 250

## 2021-04-07 MED ORDER — SODIUM CHLORIDE 0.9 % IV SOLN
750.0000 mg/m2 | Freq: Once | INTRAVENOUS | Status: AC
  Administered 2021-04-07: 1558 mg via INTRAVENOUS
  Filled 2021-04-07: qty 40.98

## 2021-04-07 MED ORDER — SODIUM CHLORIDE 0.9 % IV SOLN
560.0000 mg/m2 | Freq: Once | INTRAVENOUS | Status: AC
  Administered 2021-04-07: 1180 mg via INTRAVENOUS
  Filled 2021-04-07: qty 59

## 2021-04-07 MED ORDER — VINCRISTINE SULFATE CHEMO INJECTION 1 MG/ML
2.0000 mg | Freq: Once | INTRAVENOUS | Status: AC
  Administered 2021-04-07: 2 mg via INTRAVENOUS
  Filled 2021-04-07: qty 2

## 2021-04-07 MED ORDER — HEPARIN SOD (PORK) LOCK FLUSH 100 UNIT/ML IV SOLN
500.0000 [IU] | Freq: Once | INTRAVENOUS | Status: AC | PRN
  Administered 2021-04-07: 500 [IU]
  Filled 2021-04-07: qty 5

## 2021-04-07 MED ORDER — ACETAMINOPHEN 325 MG PO TABS
ORAL_TABLET | ORAL | Status: AC
  Filled 2021-04-07: qty 2

## 2021-04-07 MED ORDER — SODIUM CHLORIDE 0.9 % IV SOLN
375.0000 mg/m2 | Freq: Once | INTRAVENOUS | Status: AC
  Administered 2021-04-07: 800 mg via INTRAVENOUS
  Filled 2021-04-07: qty 50

## 2021-04-07 MED ORDER — PALONOSETRON HCL INJECTION 0.25 MG/5ML
INTRAVENOUS | Status: AC
  Filled 2021-04-07: qty 5

## 2021-04-07 NOTE — Progress Notes (Signed)
[place] Telephone:([phone]   Fax:([phone]  PROGRESS NOTE  Patient Care Team: Janith Lima, MD as PCP - General (Internal Medicine) Evans Lance, MD as PCP - Electrophysiology (Cardiology) [name], MD as PCP - Cardiology (Cardiology)  Hematological/Oncological History # High Grade B Cell Lymphoma, Stage III/IV 12/10/2020: CT Abdomen/Pelvis shows a soft tissue mass at 6.7 cm with soft tissue stranding.  12/14/2020: punch biopsy shows findings consistent with a high grade B cell lymphoma.  01/07/2021: establish care with Dr. Lorenso Courier  01/21/2021: PET CT scan shows diffuse disease with anterior abdominal wall lesion, pulmonary nodule, lesions along the cortex of the left/right kidney.  02/04/2021: Cycle 1 Day 1 of R-GCVP 02/25/2021: Cycle 2 Day 1 of R-GCVP. Held Cycle 2 Day 8 gemcitabine due to marked hyperglycemia. Patient admitted.  03/17/2021: Cycle 3 Day 1 of R-GCVP 04/07/2021: Cycle 4 Day 1 of R-GCVP  Interval History:  Rodney Lee 80 y.o. male with medical history significant for advanced stage high grade B cell lymphoma who presents for a follow up visit. The patient's last visit was on 03/17/2021. In the interim since the last visit he had a fall and presented for an urgent visit on 04/02/2021.  On exam today Rodney Lee unaccompanied .  He reports that the lesion on his abdomen has continued to respond to treatment.  He states that his energy is currently a 7 out of 10 and he continues to tolerate chemotherapy well other than some mental fogging and fatigue.  He has not been having any issues with fevers, chills, sweats, nausea, vomiting or diarrhea.  He notes overall he is "doing okay".  I noted that his counts were a little low today and that we may drop them down the transfusion range again but he was willing and able to proceed with chemotherapy with the understanding that he would likely require another transfusion with a cycle.  He reports that his  appetite is good and he otherwise has no questions concerns or complaints.  A full 10 point ROS is listed below.  MEDICAL HISTORY:  Past Medical History:  Diagnosis Date   Atrial fibrillation ([place])    CHADS2Vasc is at least 5, on Xarelto   CAD (coronary artery disease)    cardiomyopathy    MDT ICD, Dr. Lovena Le 2009   CHF (congestive heart failure) ([place])    Diabetes mellitus    Exogenous obesity    Hyperlipidemia    Hypertension    Hypothyroidism    MI, old 39   ANTEROLATERAL, PTCA LAD per pt   Sinus bradycardia     SURGICAL HISTORY: Past Surgical History:  Procedure Laterality Date   ANGIOPLASTY     CARDIAC CATHETERIZATION  03/13/1994   ACUTE MI WITH TOTAL OCCLUSION OF MID LAD. REPERFUSION AFTER CROSSING WITH A GUIDEWIRE. POOR RIGHT TO LEFT COLLATERAL FLOW. Pt states had PTCA   CARDIAC DEFIBRILLATOR PLACEMENT     CARDIOVASCULAR STRESS TEST  03/11/2008   EF 30%. NO REVERSIBLE ISCHEMIA   EP IMPLANTABLE DEVICE N/A 09/28/2015   Procedure:  ICD Generator Changeout;  Surgeon: Evans Lance, MD;  Location: [facility];  Service: Cardiovascular;  Laterality: N/A;   IR IMAGING GUIDED PORT INSERTION  01/28/2021   US ECHOCARDIOGRAPHY  04/26/2010   EF 35-40%. MODERATE LVH WITH MODERATE GLOBAL LV SYSTOLIC DYSFUNCTION AND IMPAIRED RELAXATION. MILD AORTIC SCLEROSIS WITHOUT STENOSIS. NORMAL PULMONARY ARTERY PRESSURE.   US ECHOCARDIOGRAPHY  10/07/2004   EF 30-35%  SOCIAL HISTORY: Social History   Socioeconomic History   Marital status: Married    Spouse name: Not on file   Number of children: Not on file   Years of education: Not on file   Highest education level: Not on file  Occupational History   Occupation: Retired  Tobacco Use   Smoking status: Never   Smokeless tobacco: Never  Vaping Use   Vaping Use: Never used  Substance and Sexual Activity   Alcohol use: No   Drug use: No   Sexual activity: Not on file  Other Topics Concern   Not on file  Social History  Narrative   Lives with wife   Social Determinants of Health   Financial Resource Strain: Not on file  Food Insecurity: Not on file  Transportation Needs: Not on file  Physical Activity: Not on file  Stress: Not on file  Social Connections: Not on file  Intimate Partner Violence: Not on file    FAMILY HISTORY: Family History  Problem Relation Age of Onset   Heart disease Mother    Heart attack Mother     ALLERGIES:  is allergic to canagliflozin and lipitor [atorvastatin calcium].  MEDICATIONS:  Current Outpatient Medications  Medication Sig Dispense Refill   allopurinol (ZYLOPRIM) 300 MG tablet Take 1 tablet (300 mg total) by mouth daily. 90 tablet 1   amiodarone (PACERONE) 200 MG tablet Take 1 tablet (200 mg total) by mouth daily. 90 tablet 0   blood glucose meter kit and supplies Dispense based on patient and insurance preference. Use up to four times daily as directed. (FOR ICD-10 E10.9, E11.9). 1 each 0   carvedilol (COREG) 6.25 MG tablet TAKE 1 TABLET BY MOUTH  TWICE DAILY WITH A MEAL 180 tablet 2   citalopram (CELEXA) 20 MG tablet Take 20 mg by mouth daily.     glimepiride (AMARYL) 1 MG tablet Take 1 mg by mouth daily with breakfast.     insulin lispro (HUMALOG KWIKPEN) 100 UNIT/ML KwikPen Use following sliding scale provided, at meal times as long as eating > 50% of your meal, up to three times daily. 15 mL 0   Insulin Pen Needle 31G X 5 MM MISC Use with insulin up to three times daily with meals 100 each 0   isosorbide mononitrate (IMDUR) 60 MG 24 hr tablet Take 60 mg in the morning and 30 mg (half a tablet) in the evening. 135 tablet 3   levothyroxine (SYNTHROID) 125 MCG tablet Take 125 mcg by mouth daily.     lidocaine-prilocaine (EMLA) cream APPLY  CREAM TOPICALLY AS NEEDED 30 g 0   Multiple Vitamin (MULTIVITAMIN) tablet Take 1 tablet by mouth daily.     nitroGLYCERIN (NITROSTAT) 0.4 MG SL tablet Place 1 tablet (0.4 mg total) under the tongue every 5 (five) minutes as  needed for chest pain. 25 tablet 2   ondansetron (ZOFRAN) 8 MG tablet Take 1 tablet (8 mg total) by mouth every 8 (eight) hours as needed for nausea or vomiting. 30 tablet 0   ONETOUCH ULTRA test strip USE 1 STRIP TO CHECK GLUCOSE AS DIRECTED     predniSONE (DELTASONE) 20 MG tablet Take 3 tablets (60 mg total) by mouth as directed. Take 60 mg  ( 3 tablets) by mouth every morning on Days 1 to 5 of chemotherapy. 15 tablet 5   prochlorperazine (COMPAZINE) 10 MG tablet TAKE 1 TABLET BY MOUTH EVERY 6 HOURS AS NEEDED FOR NAUSEA OR  VOMITING 30 tablet 0  repaglinide (PRANDIN) 2 MG tablet Take 2 mg by mouth 2 (two) times daily before a meal.     rosuvastatin (CRESTOR) 40 MG tablet Take 40 mg by mouth daily.     vitamin C (ASCORBIC ACID) 500 MG tablet Take 500 mg by mouth daily.     No current facility-administered medications for this visit.   Facility-Administered Medications Ordered in Other Visits  Medication Dose Route Frequency Provider Last Rate Last Admin   acetaminophen (TYLENOL) tablet 650 mg  650 mg Oral Once Orson Slick, MD       cyclophosphamide (CYTOXAN) 1,180 mg in sodium chloride 0.9 % 250 mL chemo infusion  560 mg/m2 (Treatment Plan Recorded) Intravenous Once Orson Slick, MD       dexamethasone (DECADRON) 10 mg in sodium chloride 0.9 % 50 mL IVPB  10 mg Intravenous Once Orson Slick, MD       diphenhydrAMINE (BENADRYL) capsule 50 mg  50 mg Oral Once Orson Slick, MD       gemcitabine (GEMZAR) 1,558 mg in sodium chloride 0.9 % 250 mL chemo infusion  750 mg/m2 (Treatment Plan Recorded) Intravenous Once Orson Slick, MD       heparin lock flush 100 unit/mL  500 Units Intracatheter Once PRN Orson Slick, MD       palonosetron (ALOXI) injection 0.25 mg  0.25 mg Intravenous Once Orson Slick, MD       riTUXimab-pvvr (RUXIENCE) 800 mg in sodium chloride 0.9 % 250 mL (2.4242 mg/mL) infusion  375 mg/m2 (Treatment Plan Recorded) Intravenous Once Orson Slick, MD       sodium chloride flush (NS) 0.9 % injection 10 mL  10 mL Intracatheter PRN Orson Slick, MD       vinCRIStine (ONCOVIN) 2 mg in sodium chloride 0.9 % 50 mL chemo infusion  2 mg Intravenous Once Orson Slick, MD        REVIEW OF SYSTEMS:   Constitutional: ( - ) fevers, ( - )  chills , ( - ) night sweats Eyes: ( - ) blurriness of vision, ( - ) double vision, ( - ) watery eyes Ears, nose, mouth, throat, and face: ( - ) mucositis, ( - ) sore throat Respiratory: ( - ) cough, ( - ) dyspnea, ( - ) wheezes Cardiovascular: ( - ) palpitation, ( - ) chest discomfort, ( - ) lower extremity swelling Gastrointestinal:  ( - ) nausea, ( - ) heartburn, ( - ) change in bowel habits Skin: ( - ) abnormal skin rashes Lymphatics: ( - ) new lymphadenopathy, ( - ) easy bruising Neurological: ( - ) numbness, ( - ) tingling, ( - ) new weaknesses Behavioral/Psych: ( - ) mood change, ( - ) new changes  All other systems were reviewed with the patient and are negative.  PHYSICAL EXAMINATION: ECOG PERFORMANCE STATUS: 2 - Symptomatic, <50% confined to bed  Vitals:   04/07/21 0841  BP: (!) 95/50  Pulse: 65  Resp: 20  Temp: 97.6 F (36.4 C)  SpO2: 98%   Filed Weights   04/07/21 0841  Weight: 188 lb 1.6 oz (85.3 kg)    GENERAL: well appearing elderly Caucasian male. alert, no distress and comfortable SKIN: prior lesion on back on head resolved. Otherwise skin color, texture, turgor are normal, no rashes or significant lesions EYES: conjunctiva are pink and non-injected, sclera clear LUNGS: clear to auscultation  and percussion with normal breathing effort HEART: regular rate & rhythm and no murmurs and no lower extremity edema ABDOMEN: Smaller left-sided lesion with some necrosis and drainage, but no overt signs of infection. Appears to be improving.  Consistent with lymphoma noted on PET CT scan. Musculoskeletal: no cyanosis of digits and no clubbing  PSYCH: alert & oriented x 3, fluent  speech NEURO: no focal motor/sensory deficits  LABORATORY DATA:  I have reviewed the data as listed CBC Latest Ref Rng & Units 04/07/2021 03/31/2021 03/24/2021  WBC 4.0 - 10.5 K/uL 7.1 9.2 1.4(L)  Hemoglobin 13.0 - 17.0 g/dL 9.0(L) 6.4(LL) 8.3(L)  Hematocrit 39.0 - 52.0 % 27.6(L) 19.8(L) 25.2(L)  Platelets 150 - 400 K/uL 125(L) 18(L) 90(L)    CMP Latest Ref Rng & Units 04/07/2021 03/31/2021 03/24/2021  Glucose 70 - 99 mg/dL 261(H) 283(H) 290(H)  BUN 8 - 23 mg/dL 27(H) 32(H) 35(H)  Creatinine 0.61 - 1.24 mg/dL 2.19(H) 2.39(H) 1.83(H)  Sodium 135 - 145 mmol/L 137 136 137  Potassium 3.5 - 5.1 mmol/L 4.7 4.9 4.6  Chloride 98 - 111 mmol/L 106 106 102  CO2 22 - 32 mmol/L 23 23 28   Calcium 8.9 - 10.3 mg/dL 8.1(L) 8.1(L) 8.4(L)  Total Protein 6.5 - 8.1 g/dL 5.1(L) 5.3(L) 5.7(L)  Total Bilirubin 0.3 - 1.2 mg/dL 0.4 0.4 0.7  Alkaline Phos 38 - 126 U/L 82 100 63  AST 15 - 41 U/L 30 28 25   ALT 0 - 44 U/L 19 17 23     RADIOGRAPHIC STUDIES: I have personally reviewed the radiological images as listed and agreed with the findings in the report: involvement of the left abdomen, the kidney's bilaterally, and pulmonary nodule.   No results found.   ASSESSMENT & PLAN Rodney Lee 80 y.o. male with medical history significant for advanced stage high grade B cell lymphoma who presents for a follow up visit.   Given Rodney Lee's complicated medical history with cardiac dysfunction, renal dysfunction, and advanced age I am concerned that he would not be able to tolerate anthracycline-based therapy such as R mini CHOP.  As such the best regimen to proceed with given his comorbidities would be R-GCVP which is not toxic to the heart or filter to the kidney and can be used in patients with advanced age.  At this time we recommend proceeding with R-GCVP chemotherapy given his advanced age, cardiac dysfunction, and poor renal function.  Previously discussed the risks and benefits of treatment including nausea,  vomiting, diarrhea, neuropathy, and allergic reactions.  Patient has an IPI showing he has high risk with lower rates of success with these treatment regimens.  Patient voices understanding of the risks and benefits of treatment was willing to proceed.  We will plan for 21 day cycles x 6 cycles. Rituximab 374m/m2 IV on Day 1, Cyclophosphamide 750 mg /m2 on Day 1, Vincristine 1.455mm2 (capped at 55m41mon Day 1, and Prednisone 32m91my 1-5 (protocol calls for prednisolone, but will adjust to lower dose prednisone). The Gemcitabine is to be administered on Day 1-8 and will be 750 mg/m2 IV over 30 minutes once per day on days 1 & 8 for Cycle 1, 875 mg/m2 IV over 30 minutes once per day on days 1 & 8 for Cycle 2, and 1000 mg/m2 IV over 30 minutes once per day on days 1 & 8 for Cycle 3 and onward.   # High Grade B Cell Lymphoma, Stage III/IV --initial PET CT scan findings consistent with at  least Stage III disease, with organ involvement most likely a Stage IV --avoiding anthracycline therapy and alternative regimens given his poor cardiac and renal function -- 02/04/2021 was Cycle 1 Day 1 of R-GCVP --Cycle 2 Day 8 was held due to hyperglycemia.  --today is Cycle 4 Day 1 of R-GCVP --plan for restarting PET CT scan prior to Cycle 5.  --patient will RTC next week for Day 8 of treatment. Will assure weekly labs between now and next Cycle. Next clinic visit in 3 weeks time.   # Skin Tear # Fall from Standing -- The patient has all he needs to keep himself from falling including a bedside commode, walker, and family support.  His falls occur when he attempts to walk to the restroom by himself without assistance.  Encourage him to ask for help. --Bleeding is modest and appears to be under control with the current bandages --No signs of infection --Today redressed the wound --Continue to monitor.  #Chemotherapy Induced Anemia #Chemotherapy Induced Thrombocytopenia --Hgb 9.0 today --Plt 125 today --continue  to monitor with transfusion support as needed.   #Supportive Care -- chemotherapy education complete -- port placed -- zofran 46m q8H PRN and compazine 160mPO q6H for nausea -- allopurinol 30024mO daily for TLS prophylaxis -- EMLA cream for port -- no pain medication required at this time.   No orders of the defined types were placed in this encounter.  All questions were answered. The patient knows to call the clinic with any problems, questions or concerns.  A total of more than 30 minutes were spent on this encounter with face-to-face time and non-face-to-face time, including preparing to see the patient, ordering tests and/or medications, counseling the patient and coordination of care as outlined above.   JohLedell PeoplesD Department of Hematology/Oncology [facility] LLCone: [phone]: [phone]: [email or web address]  04/07/2021 9:47 AM

## 2021-04-07 NOTE — Patient Instructions (Signed)
[facility]    Discharge Instructions: Thank you for choosing [facility] to provide your oncology and hematology care.   If you have a lab appointment with the [facility], please go directly to the [facility] and check in at the registration area.   Wear comfortable clothing and clothing appropriate for easy access to any Portacath or PICC line.   We strive to give you quality time with your provider. You may need to reschedule your appointment if you arrive late (15 or more minutes).  Arriving late affects you and other patients whose appointments are after yours.  Also, if you miss three or more appointments without notifying the office, you may be dismissed from the clinic at the provider's discretion.      For prescription refill requests, have your pharmacy contact our office and allow 72 hours for refills to be completed.    Today you received the following chemotherapy and/or immunotherapy agents: rituximab, vincristine, gemcitabine, and cyclophosphamide.     To help prevent nausea and vomiting after your treatment, we encourage you to take your nausea medication as directed.  BELOW ARE SYMPTOMS THAT SHOULD BE REPORTED IMMEDIATELY: *FEVER GREATER THAN 100.4 F (38 C) OR HIGHER *CHILLS OR SWEATING *NAUSEA AND VOMITING THAT IS NOT CONTROLLED WITH YOUR NAUSEA MEDICATION *UNUSUAL SHORTNESS OF BREATH *UNUSUAL BRUISING OR BLEEDING *URINARY PROBLEMS (pain or burning when urinating, or frequent urination) *BOWEL PROBLEMS (unusual diarrhea, constipation, pain near the anus) TENDERNESS IN MOUTH AND THROAT WITH OR WITHOUT PRESENCE OF ULCERS (sore throat, sores in mouth, or a toothache) UNUSUAL RASH, SWELLING OR PAIN  UNUSUAL VAGINAL DISCHARGE OR ITCHING   Items with * indicate a potential emergency and should be followed up as soon as possible or go to the Emergency Department if any problems should occur.  Please show the CHEMOTHERAPY  ALERT CARD or IMMUNOTHERAPY ALERT CARD at check-in to the Emergency Department and triage nurse.  Should you have questions after your visit or need to cancel or reschedule your appointment, please contact [facility]  Dept: [phone]  and follow the prompts.  Office hours are 8:00 a.m. to 4:30 p.m. Monday - Friday. Please note that voicemails left after 4:00 p.m. may not be returned until the following business day.  We are closed weekends and major holidays. You have access to a nurse at all times for urgent questions. Please call the main number to the clinic Dept: [phone] and follow the prompts.   For any non-urgent questions, you may also contact your provider using MyChart. We now offer e-Visits for anyone 36 and older to request care online for non-urgent symptoms. For details visit [email or web address].   Also download the MyChart app! Go to the app store, search "MyChart", open the app, select [facility], and log in with your MyChart username and password.  Due to Covid, a mask is required upon entering the hospital/clinic. If you do not have a mask, one will be given to you upon arrival. For doctor visits, patients may have 1 support person aged 61 or older with them. For treatment visits, patients cannot have anyone with them due to current Covid guidelines and our immunocompromised population.

## 2021-04-07 NOTE — Progress Notes (Signed)
Per Dr. Lorenso Courier OK to treat with Creatinine 2.19

## 2021-04-08 ENCOUNTER — Telehealth: Payer: Self-pay | Admitting: Cardiovascular Disease

## 2021-04-08 DIAGNOSIS — C8519 Unspecified B-cell lymphoma, extranodal and solid organ sites: Secondary | ICD-10-CM | POA: Diagnosis not present

## 2021-04-08 DIAGNOSIS — E1122 Type 2 diabetes mellitus with diabetic chronic kidney disease: Secondary | ICD-10-CM | POA: Diagnosis not present

## 2021-04-08 DIAGNOSIS — I5022 Chronic systolic (congestive) heart failure: Secondary | ICD-10-CM | POA: Diagnosis not present

## 2021-04-08 DIAGNOSIS — E039 Hypothyroidism, unspecified: Secondary | ICD-10-CM | POA: Diagnosis not present

## 2021-04-08 DIAGNOSIS — N401 Enlarged prostate with lower urinary tract symptoms: Secondary | ICD-10-CM | POA: Diagnosis not present

## 2021-04-08 DIAGNOSIS — C8511 Unspecified B-cell lymphoma, lymph nodes of head, face, and neck: Secondary | ICD-10-CM | POA: Diagnosis not present

## 2021-04-08 DIAGNOSIS — I25118 Atherosclerotic heart disease of native coronary artery with other forms of angina pectoris: Secondary | ICD-10-CM | POA: Diagnosis not present

## 2021-04-08 DIAGNOSIS — R630 Anorexia: Secondary | ICD-10-CM | POA: Diagnosis not present

## 2021-04-08 DIAGNOSIS — N184 Chronic kidney disease, stage 4 (severe): Secondary | ICD-10-CM | POA: Diagnosis not present

## 2021-04-08 DIAGNOSIS — I130 Hypertensive heart and chronic kidney disease with heart failure and stage 1 through stage 4 chronic kidney disease, or unspecified chronic kidney disease: Secondary | ICD-10-CM | POA: Diagnosis not present

## 2021-04-08 NOTE — Telephone Encounter (Signed)
*  STAT* If patient is at the pharmacy, call can be transferred to refill team.   1. Which medications need to be refilled? (please list name of each medication and dose if known) amiodarone (PACERONE) 200 MG tablet  2. Which pharmacy/location (including street and city if local pharmacy) is medication to be sent to? [place], [place] - [address]  3. Do they need a 30 day or 90 day supply? 7 days worth   Because through [place] the truck with the medication was delayed

## 2021-04-09 MED ORDER — AMIODARONE HCL 200 MG PO TABS: 200.0000 mg | ORAL_TABLET | Freq: Every day | ORAL | 0 refills | Status: AC

## 2021-04-12 DIAGNOSIS — C8511 Unspecified B-cell lymphoma, lymph nodes of head, face, and neck: Secondary | ICD-10-CM | POA: Diagnosis not present

## 2021-04-12 DIAGNOSIS — I130 Hypertensive heart and chronic kidney disease with heart failure and stage 1 through stage 4 chronic kidney disease, or unspecified chronic kidney disease: Secondary | ICD-10-CM | POA: Diagnosis not present

## 2021-04-12 DIAGNOSIS — C8519 Unspecified B-cell lymphoma, extranodal and solid organ sites: Secondary | ICD-10-CM | POA: Diagnosis not present

## 2021-04-12 DIAGNOSIS — I25118 Atherosclerotic heart disease of native coronary artery with other forms of angina pectoris: Secondary | ICD-10-CM | POA: Diagnosis not present

## 2021-04-12 DIAGNOSIS — E1122 Type 2 diabetes mellitus with diabetic chronic kidney disease: Secondary | ICD-10-CM | POA: Diagnosis not present

## 2021-04-12 DIAGNOSIS — E039 Hypothyroidism, unspecified: Secondary | ICD-10-CM | POA: Diagnosis not present

## 2021-04-12 DIAGNOSIS — I5022 Chronic systolic (congestive) heart failure: Secondary | ICD-10-CM | POA: Diagnosis not present

## 2021-04-12 DIAGNOSIS — N184 Chronic kidney disease, stage 4 (severe): Secondary | ICD-10-CM | POA: Diagnosis not present

## 2021-04-12 DIAGNOSIS — R630 Anorexia: Secondary | ICD-10-CM | POA: Diagnosis not present

## 2021-04-13 ENCOUNTER — Encounter: Payer: Self-pay | Admitting: Hematology and Oncology

## 2021-04-14 ENCOUNTER — Other Ambulatory Visit: Payer: Self-pay

## 2021-04-14 ENCOUNTER — Inpatient Hospital Stay: Payer: Medicare HMO

## 2021-04-14 VITALS — BP 97/53 | HR 60 | Temp 98.2°F | Resp 18 | Wt 186.2 lb

## 2021-04-14 DIAGNOSIS — Z5111 Encounter for antineoplastic chemotherapy: Secondary | ICD-10-CM | POA: Diagnosis not present

## 2021-04-14 DIAGNOSIS — Z95828 Presence of other vascular implants and grafts: Secondary | ICD-10-CM

## 2021-04-14 DIAGNOSIS — Z5112 Encounter for antineoplastic immunotherapy: Secondary | ICD-10-CM | POA: Diagnosis not present

## 2021-04-14 DIAGNOSIS — Z5189 Encounter for other specified aftercare: Secondary | ICD-10-CM | POA: Diagnosis not present

## 2021-04-14 DIAGNOSIS — C8510 Unspecified B-cell lymphoma, unspecified site: Secondary | ICD-10-CM

## 2021-04-14 LAB — CBC WITH DIFFERENTIAL (CANCER CENTER ONLY)
Abs Immature Granulocytes: 0.01 10*3/uL (ref 0.00–0.07)
Basophils Absolute: 0 10*3/uL (ref 0.0–0.1)
Basophils Relative: 3 %
Eosinophils Absolute: 0 10*3/uL (ref 0.0–0.5)
Eosinophils Relative: 0 %
HCT: 24.8 % — ABNORMAL LOW (ref 39.0–52.0)
Hemoglobin: 8.1 g/dL — ABNORMAL LOW (ref 13.0–17.0)
Immature Granulocytes: 1 %
Lymphocytes Relative: 27 %
Lymphs Abs: 0.2 10*3/uL — ABNORMAL LOW (ref 0.7–4.0)
MCH: 31.4 pg (ref 26.0–34.0)
MCHC: 32.7 g/dL (ref 30.0–36.0)
MCV: 96.1 fL (ref 80.0–100.0)
Monocytes Absolute: 0 10*3/uL — ABNORMAL LOW (ref 0.1–1.0)
Monocytes Relative: 4 %
Neutro Abs: 0.5 10*3/uL — ABNORMAL LOW (ref 1.7–7.7)
Neutrophils Relative %: 65 %
Platelet Count: 58 10*3/uL — ABNORMAL LOW (ref 150–400)
RBC: 2.58 MIL/uL — ABNORMAL LOW (ref 4.22–5.81)
RDW: 25.6 % — ABNORMAL HIGH (ref 11.5–15.5)
WBC Count: 0.7 10*3/uL — CL (ref 4.0–10.5)
nRBC: 0 % (ref 0.0–0.2)

## 2021-04-14 LAB — CMP (CANCER CENTER ONLY)
ALT: 32 U/L (ref 0–44)
AST: 31 U/L (ref 15–41)
Albumin: 2.8 g/dL — ABNORMAL LOW (ref 3.5–5.0)
Alkaline Phosphatase: 73 U/L (ref 38–126)
Anion gap: 9 (ref 5–15)
BUN: 34 mg/dL — ABNORMAL HIGH (ref 8–23)
CO2: 23 mmol/L (ref 22–32)
Calcium: 8.4 mg/dL — ABNORMAL LOW (ref 8.9–10.3)
Chloride: 105 mmol/L (ref 98–111)
Creatinine: 1.88 mg/dL — ABNORMAL HIGH (ref 0.61–1.24)
GFR, Estimated: 35 mL/min — ABNORMAL LOW (ref >60)
Glucose, Bld: 269 mg/dL — ABNORMAL HIGH (ref 70–99)
Potassium: 4.7 mmol/L (ref 3.5–5.1)
Sodium: 137 mmol/L (ref 135–145)
Total Bilirubin: 0.5 mg/dL (ref 0.3–1.2)
Total Protein: 5.2 g/dL — ABNORMAL LOW (ref 6.5–8.1)

## 2021-04-14 LAB — URIC ACID: Uric Acid, Serum: <2 mg/dL — ABNORMAL LOW (ref 3.7–8.6)

## 2021-04-14 LAB — LACTATE DEHYDROGENASE: LDH: 166 U/L (ref 98–192)

## 2021-04-14 MED ORDER — SODIUM CHLORIDE 0.9% FLUSH
10.0000 mL | Freq: Once | INTRAVENOUS | Status: AC
  Administered 2021-04-14: 10 mL
  Filled 2021-04-14: qty 10

## 2021-04-14 MED ORDER — PEGFILGRASTIM-BMEZ 6 MG/0.6ML ~~LOC~~ SOSY
6.0000 mg | PREFILLED_SYRINGE | Freq: Once | SUBCUTANEOUS | Status: AC
  Administered 2021-04-14: 6 mg via SUBCUTANEOUS
  Filled 2021-04-14: qty 0.6

## 2021-04-14 NOTE — Patient Instructions (Signed)

## 2021-04-16 ENCOUNTER — Inpatient Hospital Stay: Payer: Medicare HMO

## 2021-04-16 DIAGNOSIS — C8519 Unspecified B-cell lymphoma, extranodal and solid organ sites: Secondary | ICD-10-CM | POA: Diagnosis not present

## 2021-04-16 DIAGNOSIS — N184 Chronic kidney disease, stage 4 (severe): Secondary | ICD-10-CM | POA: Diagnosis not present

## 2021-04-16 DIAGNOSIS — I25118 Atherosclerotic heart disease of native coronary artery with other forms of angina pectoris: Secondary | ICD-10-CM | POA: Diagnosis not present

## 2021-04-16 DIAGNOSIS — I5022 Chronic systolic (congestive) heart failure: Secondary | ICD-10-CM | POA: Diagnosis not present

## 2021-04-16 DIAGNOSIS — C8511 Unspecified B-cell lymphoma, lymph nodes of head, face, and neck: Secondary | ICD-10-CM | POA: Diagnosis not present

## 2021-04-16 DIAGNOSIS — I130 Hypertensive heart and chronic kidney disease with heart failure and stage 1 through stage 4 chronic kidney disease, or unspecified chronic kidney disease: Secondary | ICD-10-CM | POA: Diagnosis not present

## 2021-04-16 DIAGNOSIS — E039 Hypothyroidism, unspecified: Secondary | ICD-10-CM | POA: Diagnosis not present

## 2021-04-16 DIAGNOSIS — E1122 Type 2 diabetes mellitus with diabetic chronic kidney disease: Secondary | ICD-10-CM | POA: Diagnosis not present

## 2021-04-16 DIAGNOSIS — R630 Anorexia: Secondary | ICD-10-CM | POA: Diagnosis not present

## 2021-04-20 DIAGNOSIS — R630 Anorexia: Secondary | ICD-10-CM | POA: Diagnosis not present

## 2021-04-20 DIAGNOSIS — E1122 Type 2 diabetes mellitus with diabetic chronic kidney disease: Secondary | ICD-10-CM | POA: Diagnosis not present

## 2021-04-20 DIAGNOSIS — C8519 Unspecified B-cell lymphoma, extranodal and solid organ sites: Secondary | ICD-10-CM | POA: Diagnosis not present

## 2021-04-20 DIAGNOSIS — E039 Hypothyroidism, unspecified: Secondary | ICD-10-CM | POA: Diagnosis not present

## 2021-04-20 DIAGNOSIS — I25118 Atherosclerotic heart disease of native coronary artery with other forms of angina pectoris: Secondary | ICD-10-CM | POA: Diagnosis not present

## 2021-04-20 DIAGNOSIS — I5022 Chronic systolic (congestive) heart failure: Secondary | ICD-10-CM | POA: Diagnosis not present

## 2021-04-20 DIAGNOSIS — C8511 Unspecified B-cell lymphoma, lymph nodes of head, face, and neck: Secondary | ICD-10-CM | POA: Diagnosis not present

## 2021-04-20 DIAGNOSIS — I130 Hypertensive heart and chronic kidney disease with heart failure and stage 1 through stage 4 chronic kidney disease, or unspecified chronic kidney disease: Secondary | ICD-10-CM | POA: Diagnosis not present

## 2021-04-20 DIAGNOSIS — N184 Chronic kidney disease, stage 4 (severe): Secondary | ICD-10-CM | POA: Diagnosis not present

## 2021-04-21 ENCOUNTER — Other Ambulatory Visit: Payer: Self-pay

## 2021-04-21 ENCOUNTER — Inpatient Hospital Stay: Payer: Medicare HMO

## 2021-04-21 DIAGNOSIS — Z95828 Presence of other vascular implants and grafts: Secondary | ICD-10-CM

## 2021-04-21 DIAGNOSIS — E039 Hypothyroidism, unspecified: Secondary | ICD-10-CM | POA: Diagnosis not present

## 2021-04-21 DIAGNOSIS — I130 Hypertensive heart and chronic kidney disease with heart failure and stage 1 through stage 4 chronic kidney disease, or unspecified chronic kidney disease: Secondary | ICD-10-CM | POA: Diagnosis not present

## 2021-04-21 DIAGNOSIS — Z5111 Encounter for antineoplastic chemotherapy: Secondary | ICD-10-CM | POA: Diagnosis not present

## 2021-04-21 DIAGNOSIS — E1122 Type 2 diabetes mellitus with diabetic chronic kidney disease: Secondary | ICD-10-CM | POA: Diagnosis not present

## 2021-04-21 DIAGNOSIS — C8510 Unspecified B-cell lymphoma, unspecified site: Secondary | ICD-10-CM

## 2021-04-21 DIAGNOSIS — C8511 Unspecified B-cell lymphoma, lymph nodes of head, face, and neck: Secondary | ICD-10-CM | POA: Diagnosis not present

## 2021-04-21 DIAGNOSIS — I25118 Atherosclerotic heart disease of native coronary artery with other forms of angina pectoris: Secondary | ICD-10-CM | POA: Diagnosis not present

## 2021-04-21 DIAGNOSIS — C8519 Unspecified B-cell lymphoma, extranodal and solid organ sites: Secondary | ICD-10-CM | POA: Diagnosis not present

## 2021-04-21 DIAGNOSIS — Z5189 Encounter for other specified aftercare: Secondary | ICD-10-CM | POA: Diagnosis not present

## 2021-04-21 DIAGNOSIS — I5022 Chronic systolic (congestive) heart failure: Secondary | ICD-10-CM | POA: Diagnosis not present

## 2021-04-21 DIAGNOSIS — Z5112 Encounter for antineoplastic immunotherapy: Secondary | ICD-10-CM | POA: Diagnosis not present

## 2021-04-21 DIAGNOSIS — N184 Chronic kidney disease, stage 4 (severe): Secondary | ICD-10-CM | POA: Diagnosis not present

## 2021-04-21 DIAGNOSIS — R630 Anorexia: Secondary | ICD-10-CM | POA: Diagnosis not present

## 2021-04-21 LAB — CBC WITH DIFFERENTIAL (CANCER CENTER ONLY)
Abs Immature Granulocytes: 0.35 10*3/uL — ABNORMAL HIGH (ref 0.00–0.07)
Basophils Absolute: 0.1 10*3/uL (ref 0.0–0.1)
Basophils Relative: 0 %
Eosinophils Absolute: 0.1 10*3/uL (ref 0.0–0.5)
Eosinophils Relative: 1 %
HCT: 24 % — ABNORMAL LOW (ref 39.0–52.0)
Hemoglobin: 7.8 g/dL — ABNORMAL LOW (ref 13.0–17.0)
Immature Granulocytes: 3 %
Lymphocytes Relative: 14 %
Lymphs Abs: 2 10*3/uL (ref 0.7–4.0)
MCH: 32.4 pg (ref 26.0–34.0)
MCHC: 32.5 g/dL (ref 30.0–36.0)
MCV: 99.6 fL (ref 80.0–100.0)
Monocytes Absolute: 2.1 10*3/uL — ABNORMAL HIGH (ref 0.1–1.0)
Monocytes Relative: 15 %
Neutro Abs: 9.4 10*3/uL — ABNORMAL HIGH (ref 1.7–7.7)
Neutrophils Relative %: 67 %
Platelet Count: 26 10*3/uL — ABNORMAL LOW (ref 150–400)
RBC: 2.41 MIL/uL — ABNORMAL LOW (ref 4.22–5.81)
RDW: 28.1 % — ABNORMAL HIGH (ref 11.5–15.5)
WBC Count: 13.9 10*3/uL — ABNORMAL HIGH (ref 4.0–10.5)
nRBC: 0 % (ref 0.0–0.2)

## 2021-04-21 LAB — CMP (CANCER CENTER ONLY)
ALT: 18 U/L (ref 0–44)
AST: 25 U/L (ref 15–41)
Albumin: 2.6 g/dL — ABNORMAL LOW (ref 3.5–5.0)
Alkaline Phosphatase: 140 U/L — ABNORMAL HIGH (ref 38–126)
Anion gap: 8 (ref 5–15)
BUN: 33 mg/dL — ABNORMAL HIGH (ref 8–23)
CO2: 23 mmol/L (ref 22–32)
Calcium: 8.2 mg/dL — ABNORMAL LOW (ref 8.9–10.3)
Chloride: 106 mmol/L (ref 98–111)
Creatinine: 2.58 mg/dL — ABNORMAL HIGH (ref 0.61–1.24)
GFR, Estimated: 24 mL/min — ABNORMAL LOW (ref >60)
Glucose, Bld: 274 mg/dL — ABNORMAL HIGH (ref 70–99)
Potassium: 4.2 mmol/L (ref 3.5–5.1)
Sodium: 137 mmol/L (ref 135–145)
Total Bilirubin: 0.3 mg/dL (ref 0.3–1.2)
Total Protein: 4.9 g/dL — ABNORMAL LOW (ref 6.5–8.1)

## 2021-04-21 LAB — LACTATE DEHYDROGENASE: LDH: 258 U/L — ABNORMAL HIGH (ref 98–192)

## 2021-04-21 LAB — URIC ACID: Uric Acid, Serum: 2.8 mg/dL — ABNORMAL LOW (ref 3.7–8.6)

## 2021-04-21 MED ORDER — SODIUM CHLORIDE 0.9% FLUSH
10.0000 mL | Freq: Once | INTRAVENOUS | Status: AC
  Administered 2021-04-21: 10 mL

## 2021-04-21 MED ORDER — HEPARIN SOD (PORK) LOCK FLUSH 100 UNIT/ML IV SOLN
500.0000 [IU] | Freq: Once | INTRAVENOUS | Status: AC
  Administered 2021-04-21: 500 [IU]

## 2021-04-22 ENCOUNTER — Encounter: Payer: Self-pay | Admitting: Podiatrist

## 2021-04-22 ENCOUNTER — Ambulatory Visit: Payer: Medicare HMO | Admitting: Podiatrist

## 2021-04-22 DIAGNOSIS — B351 Tinea unguium: Secondary | ICD-10-CM

## 2021-04-22 DIAGNOSIS — M79609 Pain in unspecified limb: Secondary | ICD-10-CM

## 2021-04-22 DIAGNOSIS — E119 Type 2 diabetes mellitus without complications: Secondary | ICD-10-CM

## 2021-04-22 NOTE — Patient Instructions (Addendum)
Diabetes Mellitus and Foot Care Foot care is an important part of your health, especially when you have diabetes. Diabetes may cause you to have problems because of poor blood flow (circulation) to your feet and legs, which can cause your skin to: Become thinner and drier. Break more easily. Heal more slowly. Peel and crack. You may also have nerve damage (neuropathy) in your legs and feet, causing decreased feeling in them. This means that you may not notice minor injuries to your feet that could lead to more serious problems. Noticing and addressing any potential problems early is the best way to prevent future foot problems. How to care for your feet Foot hygiene Wash your feet daily with warm water and mild soap. Do not use hot water. Then, pat your feet and the areas between your toes until they are completely dry. Do not soak your feet as this can dry your skin. Apply a moisturizing lotion or petroleum jelly to the skin on your feet and to dry, brittle toenails. Use lotion that does not contain alcohol and is unscented. Do not apply lotion between your toes. Shoes and socks Wear clean socks or stockings every day. Make sure they are not too tight. Do not wear knee-high stockings since they may decrease blood flow to your legs. Wear shoes that fit properly and have enough cushioning. Always look in your shoes before you put them on to be sure there are no objects inside. To break in new shoes, wear them for just a few hours a day. This prevents injuries on your feet. Wounds, scrapes, corns, and calluses  Check your feet daily for blisters, cuts, bruises, sores, and redness. If you cannot see the bottom of your feet, use a mirror or ask someone for help. Do not cut corns or calluses or try to remove them with medicine. If you find a minor scrape, cut, or break in the skin on your feet, keep it and the skin around it clean and dry. You may clean these areas with mild soap and water. Do not  clean the area with peroxide, alcohol, or iodine. If you have a wound, scrape, corn, or callus on your foot, look at it several times a day to make sure it is healing and not infected. Check for: Redness, swelling, or pain. Fluid or blood. Warmth. Pus or a bad smell. General tips Do not cross your legs. This may decrease blood flow to your feet. Do not use heating pads or hot water bottles on your feet. They may burn your skin. If you have lost feeling in your feet or legs, you may not know this is happening until it is too late. Protect your feet from hot and cold by wearing shoes, such as at the beach or on hot pavement. Schedule a complete foot exam at least once a year (annually) or more often if you have foot problems. Report any cuts, sores, or bruises to your health care provider immediately. Where to find more information American Diabetes Association: www.diabetes.org Association of Diabetes Care & Education Specialists: www.diabeteseducator.org Contact a health care provider if: You have a medical condition that increases your risk of infection and you have any cuts, sores, or bruises on your feet. You have an injury that is not healing. You have redness on your legs or feet. You feel burning or tingling in your legs or feet. You have pain or cramps in your legs and feet. Your legs or feet are numb. Your feet always   feel cold. You have pain around any toenails. Get help right away if: You have a wound, scrape, corn, or callus on your foot and: You have pain, swelling, or redness that gets worse. You have fluid or blood coming from the wound, scrape, corn, or callus. Your wound, scrape, corn, or callus feels warm to the touch. You have pus or a bad smell coming from the wound, scrape, corn, or callus. You have a fever. You have a red line going up your leg. Summary Check your feet every day for blisters, cuts, bruises, sores, and redness. Apply a moisturizing lotion or  petroleum jelly to the skin on your feet and to dry, brittle toenails. Wear shoes that fit properly and have enough cushioning. If you have foot problems, report any cuts, sores, or bruises to your health care provider immediately. Schedule a complete foot exam at least once a year (annually) or more often if you have foot problems. This information is not intended to replace advice given to you by your health care provider. Make sure you discuss any questions you have with your health care provider. Document Revised: 03/12/2020 Document Reviewed: 03/12/2020 Elsevier Patient Education  2022 [employer]  

## 2021-04-22 NOTE — Progress Notes (Signed)
Chief Complaint  Patient presents with   debride    DFC -BL nails care -FBS: 156 a1C: na pPC: Rodney Lee x 1 mo      HPI: Patient is 80 y.o. male who presents today for a diabetic foot check and for a nail trim.  He is currently on chemotherapy.  He relates no tingling or numbness in his feet.  Relates no pain or swelling.  He presents with his wife who is also his caregiver.     Patient Active Problem List   Diagnosis Date Noted   Pressure injury of skin 03/04/2021   Hyperglycemia due to diabetes mellitus ([place]) 03/04/2021   Pancytopenia ([place]) 03/04/2021   Hypothyroidism 03/04/2021   Hypotension 03/03/2021   Port-A-Cath in place 02/03/2021   High grade B-cell lymphoma ([place]) 01/26/2021   VF (ventricular fibrillation) ([place]) 03/01/2017   Trigger finger, left middle finger 02/27/2017   Coronary artery disease involving native coronary artery of native heart with angina pectoris ([place]) 10/16/2016   LBBB (left bundle branch block) 10/16/2016   Stage 3 chronic kidney disease ([place]) 10/16/2016   Ventricular tachycardia (paroxysmal) ([place]) 01/21/2015   Diabetes mellitus due to underlying condition with stage 3 chronic kidney disease ([place]) 02/13/2014   PAF (paroxysmal atrial fibrillation) ([place]) 10/08/2013   Chronic ischemic heart disease, unspecified 06/20/2011   Dyslipidemia 06/20/2011   Ischemic cardiomyopathy 05/16/2009   SINUS BRADYCARDIA 47/82/9562   Chronic systolic CHF (congestive heart failure) ([place]) 05/16/2009   Automatic implantable cardioverter-defibrillator in situ 05/16/2009    Current Outpatient Medications on File Prior to Visit  Medication Sig Dispense Refill   allopurinol (ZYLOPRIM) 300 MG tablet Take 1 tablet (300 mg total) by mouth daily. 90 tablet 1   amiodarone (PACERONE) 200 MG tablet Take 1 tablet (200 mg total) by mouth daily. 7 tablet 0   blood glucose meter kit and supplies Dispense based on patient and insurance preference. Use up to four times daily as  directed. (FOR ICD-10 E10.9, E11.9). 1 each 0   carvedilol (COREG) 6.25 MG tablet TAKE 1 TABLET BY MOUTH  TWICE DAILY WITH A MEAL 180 tablet 2   citalopram (CELEXA) 20 MG tablet Take 20 mg by mouth daily.     glimepiride (AMARYL) 1 MG tablet Take 1 mg by mouth daily with breakfast.     insulin lispro (HUMALOG KWIKPEN) 100 UNIT/ML KwikPen Use following sliding scale provided, at meal times as long as eating > 50% of your meal, up to three times daily. 15 mL 0   Insulin Pen Needle 31G X 5 MM MISC Use with insulin up to three times daily with meals 100 each 0   isosorbide mononitrate (IMDUR) 60 MG 24 hr tablet Take 60 mg in the morning and 30 mg (half a tablet) in the evening. 135 tablet 3   levothyroxine (SYNTHROID) 125 MCG tablet Take 125 mcg by mouth daily.     lidocaine-prilocaine (EMLA) cream APPLY  CREAM TOPICALLY AS NEEDED 30 g 0   Multiple Vitamin (MULTIVITAMIN) tablet Take 1 tablet by mouth daily.     nitroGLYCERIN (NITROSTAT) 0.4 MG SL tablet Place 1 tablet (0.4 mg total) under the tongue every 5 (five) minutes as needed for chest pain. 25 tablet 2   ondansetron (ZOFRAN) 8 MG tablet Take 1 tablet (8 mg total) by mouth every 8 (eight) hours as needed for nausea or vomiting. 30 tablet 0   ONETOUCH ULTRA test strip USE 1 STRIP TO CHECK GLUCOSE AS DIRECTED  predniSONE (DELTASONE) 20 MG tablet Take 3 tablets (60 mg total) by mouth as directed. Take 60 mg  ( 3 tablets) by mouth every morning on Days 1 to 5 of chemotherapy. 15 tablet 5   prochlorperazine (COMPAZINE) 10 MG tablet TAKE 1 TABLET BY MOUTH EVERY 6 HOURS AS NEEDED FOR NAUSEA OR  VOMITING 30 tablet 0   repaglinide (PRANDIN) 2 MG tablet Take 2 mg by mouth 2 (two) times daily before a meal.     rosuvastatin (CRESTOR) 40 MG tablet Take 40 mg by mouth daily.     vitamin C (ASCORBIC ACID) 500 MG tablet Take 500 mg by mouth daily.     No current facility-administered medications on file prior to visit.    Allergies  Allergen Reactions    Canagliflozin Other (See Comments)   Lipitor [Atorvastatin Calcium]     Memory problems    Review of Systems No fevers, chills, nausea, muscle aches, no difficulty breathing, no calf pain, no chest pain or shortness of breath.   Physical Exam  GENERAL APPEARANCE: Alert, conversant. Appropriately groomed. No acute distress.   VASCULAR: Pedal pulses palpable DP and PT bilateral.  Capillary refill time is immediate to all digits,  Proximal to distal cooling it warm to warm.  Digital perfusion adequate with digital hair growth noted.   NEUROLOGIC: sensation is intact to 5.07 monofilament at 5/5 sites bilateral.  Light touch is intact bilateral, vibratory sensation intact bilateral  MUSCULOSKELETAL: acceptable muscle strength, tone and stability bilateral.  Hammertoe noted left second.   No pain, crepitus or limitation noted with foot and ankle range of motion bilateral.   DERMATOLOGIC: skin is warm, supple, and dry.  Right fourth toe has a dried scab dorsally which appears stable and appears to be healing normally.  The right hallux nail has a bruise under the nail with clearing proximally-  patient doesn't recall a specific injury and denies pain.  Nails are elongated, brittle, thickened x 10.  Slightly ingrowing corners of the left hallux noted.  No pain with debridement reported.      Assessment     ICD-10-CM   1. Pain due to onychomycosis of nail  B35.1    M79.609     2. Encounter for diabetic foot exam ([place])  E11.9        Plan  Patient evaluated and recommendations for diabetes and foot care were dispensed. I debrided the patients nails in length and thickness to a safe level via nail nipper and power burr without complication. Discussed to watch the scab and bruise on the toes of the right foot. They should both go on to heal without incident.  If any increased redness, swelling, or signs of infection arise he is to call.  He will be seen back in 3 months for continued  care.

## 2021-04-27 DIAGNOSIS — E039 Hypothyroidism, unspecified: Secondary | ICD-10-CM | POA: Diagnosis not present

## 2021-04-27 DIAGNOSIS — C8511 Unspecified B-cell lymphoma, lymph nodes of head, face, and neck: Secondary | ICD-10-CM | POA: Diagnosis not present

## 2021-04-27 DIAGNOSIS — I25118 Atherosclerotic heart disease of native coronary artery with other forms of angina pectoris: Secondary | ICD-10-CM | POA: Diagnosis not present

## 2021-04-27 DIAGNOSIS — E1122 Type 2 diabetes mellitus with diabetic chronic kidney disease: Secondary | ICD-10-CM | POA: Diagnosis not present

## 2021-04-27 DIAGNOSIS — C8519 Unspecified B-cell lymphoma, extranodal and solid organ sites: Secondary | ICD-10-CM | POA: Diagnosis not present

## 2021-04-27 DIAGNOSIS — R630 Anorexia: Secondary | ICD-10-CM | POA: Diagnosis not present

## 2021-04-27 DIAGNOSIS — I5022 Chronic systolic (congestive) heart failure: Secondary | ICD-10-CM | POA: Diagnosis not present

## 2021-04-27 DIAGNOSIS — N184 Chronic kidney disease, stage 4 (severe): Secondary | ICD-10-CM | POA: Diagnosis not present

## 2021-04-27 DIAGNOSIS — I130 Hypertensive heart and chronic kidney disease with heart failure and stage 1 through stage 4 chronic kidney disease, or unspecified chronic kidney disease: Secondary | ICD-10-CM | POA: Diagnosis not present

## 2021-04-28 ENCOUNTER — Inpatient Hospital Stay: Payer: Medicare HMO

## 2021-04-28 ENCOUNTER — Inpatient Hospital Stay: Payer: Medicare HMO | Admitting: Hematology and Oncology

## 2021-04-28 ENCOUNTER — Other Ambulatory Visit: Payer: Self-pay

## 2021-04-28 VITALS — BP 86/42 | HR 65 | Temp 98.1°F | Resp 17 | Ht 71.0 in | Wt 189.6 lb

## 2021-04-28 DIAGNOSIS — C8510 Unspecified B-cell lymphoma, unspecified site: Secondary | ICD-10-CM

## 2021-04-28 DIAGNOSIS — Z5189 Encounter for other specified aftercare: Secondary | ICD-10-CM | POA: Diagnosis not present

## 2021-04-28 DIAGNOSIS — Z95828 Presence of other vascular implants and grafts: Secondary | ICD-10-CM

## 2021-04-28 DIAGNOSIS — Z5111 Encounter for antineoplastic chemotherapy: Secondary | ICD-10-CM | POA: Diagnosis not present

## 2021-04-28 DIAGNOSIS — Z5112 Encounter for antineoplastic immunotherapy: Secondary | ICD-10-CM | POA: Diagnosis not present

## 2021-04-28 LAB — CMP (CANCER CENTER ONLY)
ALT: 20 U/L (ref 0–44)
AST: 28 U/L (ref 15–41)
Albumin: 2.7 g/dL — ABNORMAL LOW (ref 3.5–5.0)
Alkaline Phosphatase: 103 U/L (ref 38–126)
Anion gap: 7 (ref 5–15)
BUN: 26 mg/dL — ABNORMAL HIGH (ref 8–23)
CO2: 24 mmol/L (ref 22–32)
Calcium: 8.2 mg/dL — ABNORMAL LOW (ref 8.9–10.3)
Chloride: 107 mmol/L (ref 98–111)
Creatinine: 2.15 mg/dL — ABNORMAL HIGH (ref 0.61–1.24)
GFR, Estimated: 30 mL/min — ABNORMAL LOW (ref >60)
Glucose, Bld: 258 mg/dL — ABNORMAL HIGH (ref 70–99)
Potassium: 4.5 mmol/L (ref 3.5–5.1)
Sodium: 138 mmol/L (ref 135–145)
Total Bilirubin: 0.3 mg/dL (ref 0.3–1.2)
Total Protein: 5 g/dL — ABNORMAL LOW (ref 6.5–8.1)

## 2021-04-28 LAB — CBC WITH DIFFERENTIAL (CANCER CENTER ONLY)
Abs Immature Granulocytes: 0.15 10*3/uL — ABNORMAL HIGH (ref 0.00–0.07)
Basophils Absolute: 0.1 10*3/uL (ref 0.0–0.1)
Basophils Relative: 1 %
Eosinophils Absolute: 0.1 10*3/uL (ref 0.0–0.5)
Eosinophils Relative: 1 %
HCT: 23.8 % — ABNORMAL LOW (ref 39.0–52.0)
Hemoglobin: 7.6 g/dL — ABNORMAL LOW (ref 13.0–17.0)
Immature Granulocytes: 2 %
Lymphocytes Relative: 18 %
Lymphs Abs: 1.6 10*3/uL (ref 0.7–4.0)
MCH: 33 pg (ref 26.0–34.0)
MCHC: 31.9 g/dL (ref 30.0–36.0)
MCV: 103.5 fL — ABNORMAL HIGH (ref 80.0–100.0)
Monocytes Absolute: 1.2 10*3/uL — ABNORMAL HIGH (ref 0.1–1.0)
Monocytes Relative: 14 %
Neutro Abs: 5.8 10*3/uL (ref 1.7–7.7)
Neutrophils Relative %: 64 %
Platelet Count: 95 10*3/uL — ABNORMAL LOW (ref 150–400)
RBC: 2.3 MIL/uL — ABNORMAL LOW (ref 4.22–5.81)
RDW: 29.3 % — ABNORMAL HIGH (ref 11.5–15.5)
WBC Count: 9 10*3/uL (ref 4.0–10.5)
nRBC: 0 % (ref 0.0–0.2)

## 2021-04-28 LAB — URIC ACID: Uric Acid, Serum: 2.4 mg/dL — ABNORMAL LOW (ref 3.7–8.6)

## 2021-04-28 LAB — LACTATE DEHYDROGENASE: LDH: 202 U/L — ABNORMAL HIGH (ref 98–192)

## 2021-04-28 MED ORDER — SODIUM CHLORIDE 0.9% FLUSH
10.0000 mL | Freq: Once | INTRAVENOUS | Status: AC
  Administered 2021-04-28: 10 mL via INTRAVENOUS

## 2021-04-28 MED ORDER — HEPARIN SOD (PORK) LOCK FLUSH 100 UNIT/ML IV SOLN
500.0000 [IU] | Freq: Once | INTRAVENOUS | Status: AC
  Administered 2021-04-28: 500 [IU] via INTRAVENOUS

## 2021-04-28 MED ORDER — SODIUM CHLORIDE 0.9% FLUSH
10.0000 mL | Freq: Once | INTRAVENOUS | Status: AC
  Administered 2021-04-28: 10 mL

## 2021-04-28 NOTE — Progress Notes (Signed)
Rodney Lee [phone]   [phone]  PROGRESS NOTE  Patient Care Team: Janith Lima, MD as PCP - General (Internal Medicine) Evans Lance, MD as PCP - Electrophysiology (Cardiology) [name], MD as PCP - Cardiology (Cardiology)  Hematological/Oncological History # High Grade B Cell Lymphoma, Stage III/IV 12/10/2020: CT Abdomen/Pelvis shows a soft tissue mass at 6.7 cm with soft tissue stranding.  12/14/2020: punch biopsy shows findings consistent with a high grade B cell lymphoma.  01/07/2021: establish care with Dr. Lorenso Courier  01/21/2021: PET CT scan shows diffuse disease with anterior abdominal wall lesion, pulmonary nodule, lesions along the cortex of the left/right kidney.  02/04/2021: Cycle 1 Day 1 of R-GCVP 02/25/2021: Cycle 2 Day 1 of R-GCVP. Held Cycle 2 Day 8 gemcitabine due to marked hyperglycemia. Patient admitted.  03/17/2021: Cycle 3 Day 1 of R-GCVP 04/07/2021: Cycle 4 Day 1 of R-GCVP 04/28/2021: Cycle 5 delayed due to cytopenias  Interval History:  Rodney Lee 80 y.o. male with medical history significant for advanced stage high grade B cell lymphoma who presents for a follow up visit. The patient's last visit was on 04/07/2021. In the interim since the last visit he had to hold on Cycle 4 Day 8 gemcitabine due to cytopenias.   On exam today Rodney Lee is accompanied by his wife.  He reports he has been eating well and has had no major symptoms since her last visit.  He reports that his energy is good and he is not having any issues with shortness of breath.  He does appear pale today.  He said a few episodes of stumbling lower to the ground but no severe falls.  He reports that his appetite is good and he otherwise has no questions concerns or complaints.  A full 10 point ROS is listed below.  MEDICAL HISTORY:  Past Medical History:  Diagnosis Date   Atrial fibrillation ([place])    CHADS2Vasc is at least 5, on Xarelto   CAD (coronary  artery disease)    cardiomyopathy    MDT ICD, Dr. Lovena Le 2009   CHF (congestive heart failure) ([place])    Diabetes mellitus    Exogenous obesity    Hyperlipidemia    Hypertension    Hypothyroidism    MI, old 5   ANTEROLATERAL, PTCA LAD per pt   Sinus bradycardia     SURGICAL HISTORY: Past Surgical History:  Procedure Laterality Date   ANGIOPLASTY     CARDIAC CATHETERIZATION  03/13/1994   ACUTE MI WITH TOTAL OCCLUSION OF MID LAD. REPERFUSION AFTER CROSSING WITH A GUIDEWIRE. POOR RIGHT TO LEFT COLLATERAL FLOW. Pt states had PTCA   CARDIAC DEFIBRILLATOR PLACEMENT     CARDIOVASCULAR STRESS TEST  03/11/2008   EF 30%. NO REVERSIBLE ISCHEMIA   EP IMPLANTABLE DEVICE N/A 09/28/2015   Procedure:  ICD Generator Changeout;  Surgeon: Evans Lance, MD;  Location: [facility];  Service: Cardiovascular;  Laterality: N/A;   IR IMAGING GUIDED PORT INSERTION  01/28/2021   US ECHOCARDIOGRAPHY  04/26/2010   EF 35-40%. MODERATE LVH WITH MODERATE GLOBAL LV SYSTOLIC DYSFUNCTION AND IMPAIRED RELAXATION. MILD AORTIC SCLEROSIS WITHOUT STENOSIS. NORMAL PULMONARY ARTERY PRESSURE.   US ECHOCARDIOGRAPHY  10/07/2004   EF 30-35%    SOCIAL HISTORY: Social History   Socioeconomic History   Marital status: Married    Spouse name: Not on file   Number of children: Not on file   Years of education: Not on file  Highest education level: Not on file  Occupational History   Occupation: Retired  Tobacco Use   Smoking status: Never   Smokeless tobacco: Never  Vaping Use   Vaping Use: Never used  Substance and Sexual Activity   Alcohol use: No   Drug use: No   Sexual activity: Not on file  Other Topics Concern   Not on file  Social History Narrative   Lives with wife   Social Determinants of Health   Financial Resource Strain: Not on file  Food Insecurity: Not on file  Transportation Needs: Not on file  Physical Activity: Not on file  Stress: Not on file  Social Connections: Not on file   Intimate Partner Violence: Not on file    FAMILY HISTORY: Family History  Problem Relation Age of Onset   Heart disease Mother    Heart attack Mother     ALLERGIES:  is allergic to canagliflozin and lipitor [atorvastatin calcium].  MEDICATIONS:  Current Outpatient Medications  Medication Sig Dispense Refill   allopurinol (ZYLOPRIM) 300 MG tablet Take 1 tablet (300 mg total) by mouth daily. 90 tablet 1   amiodarone (PACERONE) 200 MG tablet Take 1 tablet (200 mg total) by mouth daily. 7 tablet 0   blood glucose meter kit and supplies Dispense based on patient and insurance preference. Use up to four times daily as directed. (FOR ICD-10 E10.9, E11.9). 1 each 0   carvedilol (COREG) 6.25 MG tablet TAKE 1 TABLET BY MOUTH  TWICE DAILY WITH A MEAL 180 tablet 2   citalopram (CELEXA) 20 MG tablet Take 20 mg by mouth daily.     glimepiride (AMARYL) 1 MG tablet Take 1 mg by mouth daily with breakfast.     insulin lispro (HUMALOG KWIKPEN) 100 UNIT/ML KwikPen Use following sliding scale provided, at meal times as long as eating > 50% of your meal, up to three times daily. 15 mL 0   Insulin Pen Needle 31G X 5 MM MISC Use with insulin up to three times daily with meals 100 each 0   isosorbide mononitrate (IMDUR) 60 MG 24 hr tablet Take 60 mg in the morning and 30 mg (half a tablet) in the evening. 135 tablet 3   levothyroxine (SYNTHROID) 125 MCG tablet Take 125 mcg by mouth daily.     lidocaine-prilocaine (EMLA) cream APPLY  CREAM TOPICALLY AS NEEDED 30 g 0   Multiple Vitamin (MULTIVITAMIN) tablet Take 1 tablet by mouth daily.     nitroGLYCERIN (NITROSTAT) 0.4 MG SL tablet Place 1 tablet (0.4 mg total) under the tongue every 5 (five) minutes as needed for chest pain. 25 tablet 2   ondansetron (ZOFRAN) 8 MG tablet Take 1 tablet (8 mg total) by mouth every 8 (eight) hours as needed for nausea or vomiting. 30 tablet 0   ONETOUCH ULTRA test strip USE 1 STRIP TO CHECK GLUCOSE AS DIRECTED     predniSONE  (DELTASONE) 20 MG tablet Take 3 tablets (60 mg total) by mouth as directed. Take 60 mg  ( 3 tablets) by mouth every morning on Days 1 to 5 of chemotherapy. 15 tablet 5   prochlorperazine (COMPAZINE) 10 MG tablet TAKE 1 TABLET BY MOUTH EVERY 6 HOURS AS NEEDED FOR NAUSEA OR  VOMITING 30 tablet 0   repaglinide (PRANDIN) 2 MG tablet Take 2 mg by mouth 2 (two) times daily before a meal.     rosuvastatin (CRESTOR) 40 MG tablet Take 40 mg by mouth daily.     vitamin  C (ASCORBIC ACID) 500 MG tablet Take 500 mg by mouth daily.     No current facility-administered medications for this visit.    REVIEW OF SYSTEMS:   Constitutional: ( - ) fevers, ( - )  chills , ( - ) night sweats Eyes: ( - ) blurriness of vision, ( - ) double vision, ( - ) watery eyes Ears, nose, mouth, throat, and face: ( - ) mucositis, ( - ) sore throat Respiratory: ( - ) cough, ( - ) dyspnea, ( - ) wheezes Cardiovascular: ( - ) palpitation, ( - ) chest discomfort, ( - ) lower extremity swelling Gastrointestinal:  ( - ) nausea, ( - ) heartburn, ( - ) change in bowel habits Skin: ( - ) abnormal skin rashes Lymphatics: ( - ) new lymphadenopathy, ( - ) easy bruising Neurological: ( - ) numbness, ( - ) tingling, ( - ) new weaknesses Behavioral/Psych: ( - ) mood change, ( - ) new changes  All other systems were reviewed with the patient and are negative.  PHYSICAL EXAMINATION: ECOG PERFORMANCE STATUS: 2 - Symptomatic, <50% confined to bed  Vitals:   04/28/21 0827  BP: (!) 86/42  Pulse: 65  Resp: 17  Temp: 98.1 F (36.7 C)  SpO2: 99%   Filed Weights   04/28/21 0827  Weight: 189 lb 9.6 oz (86 kg)    GENERAL: well appearing elderly Caucasian male. alert, no distress and comfortable SKIN: prior lesion on back on head resolved. Otherwise skin color, texture, turgor are normal, no rashes or significant lesions EYES: conjunctiva are pink and non-injected, sclera clear LUNGS: clear to auscultation and percussion with normal  breathing effort HEART: regular rate & rhythm and no murmurs and no lower extremity edema ABDOMEN: Smaller left-sided lesion with some necrosis but no drainage. No overt signs of infection. Appears to be improving.  Consistent with lymphoma noted on PET CT scan. Musculoskeletal: no cyanosis of digits and no clubbing  PSYCH: alert & oriented x 3, fluent speech NEURO: no focal motor/sensory deficits  LABORATORY DATA:  I have reviewed the data as listed CBC Latest Ref Rng & Units 04/28/2021 04/21/2021 04/14/2021  WBC 4.0 - 10.5 K/uL 9.0 13.9(H) 0.7(LL)  Hemoglobin 13.0 - 17.0 g/dL 7.6(L) 7.8(L) 8.1(L)  Hematocrit 39.0 - 52.0 % 23.8(L) 24.0(L) 24.8(L)  Platelets 150 - 400 K/uL 95(L) 26(L) 58(L)    CMP Latest Ref Rng & Units 04/28/2021 04/21/2021 04/14/2021  Glucose 70 - 99 mg/dL 258(H) 274(H) 269(H)  BUN 8 - 23 mg/dL 26(H) 33(H) 34(H)  Creatinine 0.61 - 1.24 mg/dL 2.15(H) 2.58(H) 1.88(H)  Sodium 135 - 145 mmol/L 138 137 137  Potassium 3.5 - 5.1 mmol/L 4.5 4.2 4.7  Chloride 98 - 111 mmol/L 107 106 105  CO2 22 - 32 mmol/L _0 Calcium 8.9 - 10.3 mg/dL 8.2(L) 8.2(L) 8.4(L)  Total Protein 6.5 - 8.1 g/dL 5.0(L) 4.9(L) 5.2(L)  Total Bilirubin 0.3 - 1.2 mg/dL 0.3 0.3 0.5  Alkaline Phos 38 - 126 U/L 103 140(H) 73  AST 15 - 41 U/L _1 ALT 0 - 44 U/L 20 18 32    RADIOGRAPHIC STUDIES: I have personally reviewed the radiological images as listed and agreed with the findings in the report: involvement of the left abdomen, the kidney's bilaterally, and pulmonary nodule.   No results found.   ASSESSMENT & PLAN Rodney Lee 80 y.o. male with medical history significant for advanced stage high grade B cell lymphoma who  presents for a follow up visit.   Given Rodney Lee's complicated medical history with cardiac dysfunction, renal dysfunction, and advanced age I am concerned that he would not be able to tolerate anthracycline-based therapy such as R mini CHOP.  As such the best regimen to  proceed with given his comorbidities would be R-GCVP which is not toxic to the heart or filter to the kidney and can be used in patients with advanced age.  At this time we recommend proceeding with R-GCVP chemotherapy given his advanced age, cardiac dysfunction, and poor renal function.  Previously discussed the risks and benefits of treatment including nausea, vomiting, diarrhea, neuropathy, and allergic reactions.  Patient has an IPI showing he has high risk with lower rates of success with these treatment regimens.  Patient voices understanding of the risks and benefits of treatment was willing to proceed.  We will plan for 21 day cycles x 6 cycles. Rituximab 363m/m2 IV on Day 1, Cyclophosphamide 750 mg /m2 on Day 1, Vincristine 1.473mm2 (capped at 81m57mon Day 1, and Prednisone 51m581my 1-5 (protocol calls for prednisolone, but will adjust to lower dose prednisone). The Gemcitabine is to be administered on Day 1-8 and will be 750 mg/m2 IV over 30 minutes once per day on days 1 & 8 for Cycle 1, 875 mg/m2 IV over 30 minutes once per day on days 1 & 8 for Cycle 2, and 1000 mg/m2 IV over 30 minutes once per day on days 1 & 8 for Cycle 3 and onward.   # High Grade B Cell Lymphoma, Stage III/IV --initial PET CT scan findings consistent with at least Stage III disease, with organ involvement most likely a Stage IV --avoiding anthracycline therapy and alternative regimens given his poor cardiac and renal function -- 02/04/2021 was Cycle 1 Day 1 of R-GCVP --Cycle 2 Day 8 was held due to hyperglycemia. Cycle  4 Day 8 held due to cytopenias.  --today was to be Cycle 5 Day 1 of R-GCVP, but we will hold and reschedule next week to give his counts more time to recover --plan for restaging PET CT scan prior to Cycle 5. Scheduled for 05/03/2021.  --patient will RTC next week for consideration of starting Cycle 5 of treatment.   # Skin Tear, improved # Fall from Standing -- The patient has all he needs to keep  himself from falling including a bedside commode, walker, and family support.  His falls occur when he attempts to walk to the restroom by himself without assistance.  Encourage him to ask for help. --no further bleeding, well healing at this time.  --No signs of infection --Continue to monitor.  #Chemotherapy Induced Anemia #Chemotherapy Induced Thrombocytopenia --Hgb 7.6 today --Plt 95 today --continue to monitor with transfusion support as needed.   #Supportive Care -- chemotherapy education complete -- port placed -- zofran 8mg 8m PRN and compazine 10mg 481m6H for nausea -- allopurinol 300mg P91mily for TLS prophylaxis -- EMLA cream for port -- no pain medication required at this time.   No orders of the defined types were placed in this encounter.  All questions were answered. The patient knows to call the clinic with any problems, questions or concerns.  A total of more than 30 minutes were spent on this encounter with face-to-face time and non-face-to-face time, including preparing to see the patient, ordering tests and/or medications, counseling the patient and coordination of care as outlined above.   Rodney T.Ledell Peoplespartment of Hematology/Oncology  [place] [facility] Phone: [phone] Pager: [phone] Email: [email or web address]  04/28/2021 9:49 AM

## 2021-05-03 ENCOUNTER — Ambulatory Visit (HOSPITAL_COMMUNITY)
Admission: RE | Admit: 2021-05-03 | Discharge: 2021-05-03 | Disposition: A | Discharge status: Home / Self Care | Payer: Medicare HMO | Source: Ambulatory Visit | Attending: Hematology and Oncology | Admitting: Hematology and Oncology

## 2021-05-03 ENCOUNTER — Telehealth: Payer: Self-pay | Admitting: *Deleted

## 2021-05-03 ENCOUNTER — Other Ambulatory Visit: Payer: Self-pay

## 2021-05-03 ENCOUNTER — Ambulatory Visit (HOSPITAL_COMMUNITY): Admission: RE | Admit: 2021-05-03 | Payer: Medicare HMO | Source: Ambulatory Visit

## 2021-05-03 ENCOUNTER — Other Ambulatory Visit: Payer: Self-pay | Admitting: *Deleted

## 2021-05-03 DIAGNOSIS — I2510 Atherosclerotic heart disease of native coronary artery without angina pectoris: Secondary | ICD-10-CM | POA: Insufficient documentation

## 2021-05-03 DIAGNOSIS — C8510 Unspecified B-cell lymphoma, unspecified site: Secondary | ICD-10-CM

## 2021-05-03 DIAGNOSIS — N3289 Other specified disorders of bladder: Secondary | ICD-10-CM | POA: Diagnosis not present

## 2021-05-03 DIAGNOSIS — I700 Atherosclerosis of aorta: Secondary | ICD-10-CM | POA: Insufficient documentation

## 2021-05-03 DIAGNOSIS — N289 Disorder of kidney and ureter, unspecified: Secondary | ICD-10-CM | POA: Diagnosis not present

## 2021-05-03 LAB — GLUCOSE, CAPILLARY: Glucose-Capillary: 124 mg/dL — ABNORMAL HIGH (ref 70–99)

## 2021-05-03 MED ORDER — FLUDEOXYGLUCOSE F - 18 (FDG) INJECTION
9.0000 | Freq: Once | INTRAVENOUS | Status: AC | PRN
  Administered 2021-05-03: 9.46 via INTRAVENOUS

## 2021-05-03 NOTE — Telephone Encounter (Signed)
Received call from pt's wife, Rodney Lee.  She is concerned that the tumor on her husband's lower abdomen has gotten bigger in the last few days. It had gotten much smaller after starting his chemo. He had to miss last week's treatment due to low blood counts. Pt did have his PET scan today.  He is due to return to the cancer center on Wednesday, 05/05/21 for labs and chemo. His treatment will have to be adjusted since he did not have it last week. Dr. Lorenso Courier aware and will contact pharmacy about that. Charge nurse notified of need for longer appt time on Wednesday.   Scheduling message sent to add MD appt in infusion on 05/05/21 Rodney Lee made aware of the above.

## 2021-05-04 MED FILL — Dexamethasone Sodium Phosphate Inj 100 MG/10ML: INTRAMUSCULAR | Qty: 1 | Status: AC

## 2021-05-05 ENCOUNTER — Inpatient Hospital Stay: Payer: Medicare HMO

## 2021-05-05 ENCOUNTER — Other Ambulatory Visit: Payer: Self-pay

## 2021-05-05 ENCOUNTER — Inpatient Hospital Stay: Payer: Medicare HMO | Admitting: Hematology and Oncology

## 2021-05-05 VITALS — BP 99/52 | HR 62 | Temp 98.7°F | Resp 16

## 2021-05-05 DIAGNOSIS — Z95828 Presence of other vascular implants and grafts: Secondary | ICD-10-CM

## 2021-05-05 DIAGNOSIS — C8510 Unspecified B-cell lymphoma, unspecified site: Secondary | ICD-10-CM

## 2021-05-05 DIAGNOSIS — Z5189 Encounter for other specified aftercare: Secondary | ICD-10-CM | POA: Diagnosis not present

## 2021-05-05 DIAGNOSIS — Z5111 Encounter for antineoplastic chemotherapy: Secondary | ICD-10-CM | POA: Diagnosis not present

## 2021-05-05 DIAGNOSIS — Z5112 Encounter for antineoplastic immunotherapy: Secondary | ICD-10-CM | POA: Diagnosis not present

## 2021-05-05 LAB — SAMPLE TO BLOOD BANK

## 2021-05-05 LAB — CMP (CANCER CENTER ONLY)
ALT: 22 U/L (ref 0–44)
AST: 28 U/L (ref 15–41)
Albumin: 3 g/dL — ABNORMAL LOW (ref 3.5–5.0)
Alkaline Phosphatase: 86 U/L (ref 38–126)
Anion gap: 7 (ref 5–15)
BUN: 35 mg/dL — ABNORMAL HIGH (ref 8–23)
CO2: 25 mmol/L (ref 22–32)
Calcium: 8.5 mg/dL — ABNORMAL LOW (ref 8.9–10.3)
Chloride: 107 mmol/L (ref 98–111)
Creatinine: 2.03 mg/dL — ABNORMAL HIGH (ref 0.61–1.24)
GFR, Estimated: 32 mL/min — ABNORMAL LOW (ref >60)
Glucose, Bld: 267 mg/dL — ABNORMAL HIGH (ref 70–99)
Potassium: 4.5 mmol/L (ref 3.5–5.1)
Sodium: 139 mmol/L (ref 135–145)
Total Bilirubin: 0.4 mg/dL (ref 0.3–1.2)
Total Protein: 5.4 g/dL — ABNORMAL LOW (ref 6.5–8.1)

## 2021-05-05 LAB — CBC WITH DIFFERENTIAL (CANCER CENTER ONLY)
Abs Immature Granulocytes: 0.05 10*3/uL (ref 0.00–0.07)
Basophils Absolute: 0.1 10*3/uL (ref 0.0–0.1)
Basophils Relative: 1 %
Eosinophils Absolute: 0.1 10*3/uL (ref 0.0–0.5)
Eosinophils Relative: 2 %
HCT: 26.9 % — ABNORMAL LOW (ref 39.0–52.0)
Hemoglobin: 8.6 g/dL — ABNORMAL LOW (ref 13.0–17.0)
Immature Granulocytes: 1 %
Lymphocytes Relative: 26 %
Lymphs Abs: 1.8 10*3/uL (ref 0.7–4.0)
MCH: 34 pg (ref 26.0–34.0)
MCHC: 32 g/dL (ref 30.0–36.0)
MCV: 106.3 fL — ABNORMAL HIGH (ref 80.0–100.0)
Monocytes Absolute: 0.9 10*3/uL (ref 0.1–1.0)
Monocytes Relative: 13 %
Neutro Abs: 4 10*3/uL (ref 1.7–7.7)
Neutrophils Relative %: 57 %
Platelet Count: 65 10*3/uL — ABNORMAL LOW (ref 150–400)
RBC: 2.53 MIL/uL — ABNORMAL LOW (ref 4.22–5.81)
RDW: 29.1 % — ABNORMAL HIGH (ref 11.5–15.5)
WBC Count: 6.9 10*3/uL (ref 4.0–10.5)
nRBC: 0 % (ref 0.0–0.2)

## 2021-05-05 LAB — LACTATE DEHYDROGENASE: LDH: 184 U/L (ref 98–192)

## 2021-05-05 LAB — URIC ACID: Uric Acid, Serum: 2.4 mg/dL — ABNORMAL LOW (ref 3.7–8.6)

## 2021-05-05 MED ORDER — HEPARIN SOD (PORK) LOCK FLUSH 100 UNIT/ML IV SOLN
500.0000 [IU] | INTRAVENOUS | Status: AC | PRN
  Administered 2021-05-05: 500 [IU]

## 2021-05-05 MED ORDER — SODIUM CHLORIDE 0.9% FLUSH
10.0000 mL | Freq: Once | INTRAVENOUS | Status: AC
  Administered 2021-05-05: 10 mL

## 2021-05-05 MED ORDER — SODIUM CHLORIDE 0.9% FLUSH
10.0000 mL | INTRAVENOUS | Status: AC | PRN
  Administered 2021-05-05: 10 mL

## 2021-05-05 NOTE — Progress Notes (Signed)
Reviewed labs with Dr. Lorenso Courier. He advises no treatment today and patient will be seen in his office.

## 2021-05-06 DIAGNOSIS — I5022 Chronic systolic (congestive) heart failure: Secondary | ICD-10-CM | POA: Diagnosis not present

## 2021-05-06 DIAGNOSIS — E039 Hypothyroidism, unspecified: Secondary | ICD-10-CM | POA: Diagnosis not present

## 2021-05-06 DIAGNOSIS — C8511 Unspecified B-cell lymphoma, lymph nodes of head, face, and neck: Secondary | ICD-10-CM | POA: Diagnosis not present

## 2021-05-06 DIAGNOSIS — C8519 Unspecified B-cell lymphoma, extranodal and solid organ sites: Secondary | ICD-10-CM | POA: Diagnosis not present

## 2021-05-06 DIAGNOSIS — R630 Anorexia: Secondary | ICD-10-CM | POA: Diagnosis not present

## 2021-05-06 DIAGNOSIS — E1122 Type 2 diabetes mellitus with diabetic chronic kidney disease: Secondary | ICD-10-CM | POA: Diagnosis not present

## 2021-05-06 DIAGNOSIS — I130 Hypertensive heart and chronic kidney disease with heart failure and stage 1 through stage 4 chronic kidney disease, or unspecified chronic kidney disease: Secondary | ICD-10-CM | POA: Diagnosis not present

## 2021-05-06 DIAGNOSIS — N184 Chronic kidney disease, stage 4 (severe): Secondary | ICD-10-CM | POA: Diagnosis not present

## 2021-05-06 DIAGNOSIS — I25118 Atherosclerotic heart disease of native coronary artery with other forms of angina pectoris: Secondary | ICD-10-CM | POA: Diagnosis not present

## 2021-05-07 ENCOUNTER — Ambulatory Visit: Payer: Medicare HMO

## 2021-05-08 DIAGNOSIS — N184 Chronic kidney disease, stage 4 (severe): Secondary | ICD-10-CM | POA: Diagnosis not present

## 2021-05-08 DIAGNOSIS — E039 Hypothyroidism, unspecified: Secondary | ICD-10-CM | POA: Diagnosis not present

## 2021-05-08 DIAGNOSIS — I130 Hypertensive heart and chronic kidney disease with heart failure and stage 1 through stage 4 chronic kidney disease, or unspecified chronic kidney disease: Secondary | ICD-10-CM | POA: Diagnosis not present

## 2021-05-08 DIAGNOSIS — C8519 Unspecified B-cell lymphoma, extranodal and solid organ sites: Secondary | ICD-10-CM | POA: Diagnosis not present

## 2021-05-08 DIAGNOSIS — C8511 Unspecified B-cell lymphoma, lymph nodes of head, face, and neck: Secondary | ICD-10-CM | POA: Diagnosis not present

## 2021-05-08 DIAGNOSIS — I25118 Atherosclerotic heart disease of native coronary artery with other forms of angina pectoris: Secondary | ICD-10-CM | POA: Diagnosis not present

## 2021-05-08 DIAGNOSIS — R630 Anorexia: Secondary | ICD-10-CM | POA: Diagnosis not present

## 2021-05-08 DIAGNOSIS — I5022 Chronic systolic (congestive) heart failure: Secondary | ICD-10-CM | POA: Diagnosis not present

## 2021-05-08 DIAGNOSIS — E1122 Type 2 diabetes mellitus with diabetic chronic kidney disease: Secondary | ICD-10-CM | POA: Diagnosis not present

## 2021-05-10 ENCOUNTER — Encounter: Payer: Self-pay | Admitting: Hematology and Oncology

## 2021-05-10 NOTE — Progress Notes (Signed)
[place] [phone]   [phone]  PROGRESS NOTE  Patient Care Team: Janith Lima, MD as PCP - General (Internal Medicine) Evans Lance, MD as PCP - Electrophysiology (Cardiology) [name], MD as PCP - Cardiology (Cardiology)  Hematological/Oncological History # High Grade B Cell Lymphoma, Stage III/IV 12/10/2020: CT Abdomen/Pelvis shows a soft tissue mass at 6.7 cm with soft tissue stranding.  12/14/2020: punch biopsy shows findings consistent with a high grade B cell lymphoma.  01/07/2021: establish care with Dr. Lorenso Courier  01/21/2021: PET CT scan shows diffuse disease with anterior abdominal wall lesion, pulmonary nodule, lesions along the cortex of the left/right kidney.  02/04/2021: Cycle 1 Day 1 of R-GCVP 02/25/2021: Cycle 2 Day 1 of R-GCVP. Held Cycle 2 Day 8 gemcitabine due to marked hyperglycemia. Patient admitted.  03/17/2021: Cycle 3 Day 1 of R-GCVP 04/07/2021: Cycle 4 Day 1 of R-GCVP 04/28/2021: Cycle 5 delayed due to cytopenias 05/05/2021: Chemotherapy held due to failure to respond to therapy noted on PET CT scan. Poor recovery of blood counts and continued deconditioning.   Interval History:  Rodney Lee 80 y.o. male with medical history significant for advanced stage high grade B cell lymphoma who presents for a follow up visit. The patient's last visit was on 04/28/2021. In the interim since the last visit he had to hold on Cycle 5 Day 1 due to cytopenias.   On exam today Rodney Lee is accompanied by his wife.  He reports he has felt well overall with no nausea, vomiting, or diarrhea.  He is having some issues with shortness of breath and dizziness.  His energy levels have been so-so.  Fortunately his appetite has been good and he has not been having any fevers, chills, sweats, nausea, vomiting or diarrhea.  He does endorse having continued unsteadiness on his feet and frequent falls.  A full 10 point ROS is listed below.  Due to his  worsening clinical status, forced results of PET CT scan, and for recovery from chemotherapy we have ceased chemotherapy.  At this time I have begun pursuing goals of care discussions.  MEDICAL HISTORY:  Past Medical History:  Diagnosis Date   Atrial fibrillation ([place])    CHADS2Vasc is at least 5, on Xarelto   CAD (coronary artery disease)    cardiomyopathy    MDT ICD, Dr. Lovena Le 2009   CHF (congestive heart failure) ([place])    Diabetes mellitus    Exogenous obesity    Hyperlipidemia    Hypertension    Hypothyroidism    MI, old 54   ANTEROLATERAL, PTCA LAD per pt   Sinus bradycardia     SURGICAL HISTORY: Past Surgical History:  Procedure Laterality Date   ANGIOPLASTY     CARDIAC CATHETERIZATION  03/13/1994   ACUTE MI WITH TOTAL OCCLUSION OF MID LAD. REPERFUSION AFTER CROSSING WITH A GUIDEWIRE. POOR RIGHT TO LEFT COLLATERAL FLOW. Pt states had PTCA   CARDIAC DEFIBRILLATOR PLACEMENT     CARDIOVASCULAR STRESS TEST  03/11/2008   EF 30%. NO REVERSIBLE ISCHEMIA   EP IMPLANTABLE DEVICE N/A 09/28/2015   Procedure:  ICD Generator Changeout;  Surgeon: Evans Lance, MD;  Location: [facility];  Service: Cardiovascular;  Laterality: N/A;   IR IMAGING GUIDED PORT INSERTION  01/28/2021   US ECHOCARDIOGRAPHY  04/26/2010   EF 35-40%. MODERATE LVH WITH MODERATE GLOBAL LV SYSTOLIC DYSFUNCTION AND IMPAIRED RELAXATION. MILD AORTIC SCLEROSIS WITHOUT STENOSIS. NORMAL PULMONARY ARTERY PRESSURE.   US ECHOCARDIOGRAPHY  10/07/2004   EF 30-35%    SOCIAL HISTORY: Social History   Socioeconomic History   Marital status: Married    Spouse name: Not on file   Number of children: Not on file   Years of education: Not on file   Highest education level: Not on file  Occupational History   Occupation: Retired  Tobacco Use   Smoking status: Never   Smokeless tobacco: Never  Vaping Use   Vaping Use: Never used  Substance and Sexual Activity   Alcohol use: No   Drug use: No   Sexual activity:  Not on file  Other Topics Concern   Not on file  Social History Narrative   Lives with wife   Social Determinants of Health   Financial Resource Strain: Not on file  Food Insecurity: Not on file  Transportation Needs: Not on file  Physical Activity: Not on file  Stress: Not on file  Social Connections: Not on file  Intimate Partner Violence: Not on file    FAMILY HISTORY: Family History  Problem Relation Age of Onset   Heart disease Mother    Heart attack Mother     ALLERGIES:  is allergic to canagliflozin and lipitor [atorvastatin calcium].  MEDICATIONS:  Current Outpatient Medications  Medication Sig Dispense Refill   allopurinol (ZYLOPRIM) 300 MG tablet Take 1 tablet (300 mg total) by mouth daily. 90 tablet 1   amiodarone (PACERONE) 200 MG tablet Take 1 tablet (200 mg total) by mouth daily. 7 tablet 0   blood glucose meter kit and supplies Dispense based on patient and insurance preference. Use up to four times daily as directed. (FOR ICD-10 E10.9, E11.9). 1 each 0   carvedilol (COREG) 6.25 MG tablet TAKE 1 TABLET BY MOUTH  TWICE DAILY WITH A MEAL 180 tablet 2   citalopram (CELEXA) 20 MG tablet Take 20 mg by mouth daily.     glimepiride (AMARYL) 1 MG tablet Take 1 mg by mouth daily with breakfast.     insulin lispro (HUMALOG KWIKPEN) 100 UNIT/ML KwikPen Use following sliding scale provided, at meal times as long as eating > 50% of your meal, up to three times daily. 15 mL 0   Insulin Pen Needle 31G X 5 MM MISC Use with insulin up to three times daily with meals 100 each 0   isosorbide mononitrate (IMDUR) 60 MG 24 hr tablet Take 60 mg in the morning and 30 mg (half a tablet) in the evening. 135 tablet 3   levothyroxine (SYNTHROID) 125 MCG tablet Take 125 mcg by mouth daily.     lidocaine-prilocaine (EMLA) cream APPLY  CREAM TOPICALLY AS NEEDED 30 g 0   Multiple Vitamin (MULTIVITAMIN) tablet Take 1 tablet by mouth daily.     nitroGLYCERIN (NITROSTAT) 0.4 MG SL tablet Place 1  tablet (0.4 mg total) under the tongue every 5 (five) minutes as needed for chest pain. 25 tablet 2   ondansetron (ZOFRAN) 8 MG tablet Take 1 tablet (8 mg total) by mouth every 8 (eight) hours as needed for nausea or vomiting. 30 tablet 0   ONETOUCH ULTRA test strip USE 1 STRIP TO CHECK GLUCOSE AS DIRECTED     predniSONE (DELTASONE) 20 MG tablet Take 3 tablets (60 mg total) by mouth as directed. Take 60 mg  ( 3 tablets) by mouth every morning on Days 1 to 5 of chemotherapy. 15 tablet 5   prochlorperazine (COMPAZINE) 10 MG tablet TAKE 1 TABLET BY MOUTH EVERY 6 HOURS AS NEEDED  FOR NAUSEA OR  VOMITING 30 tablet 0   repaglinide (PRANDIN) 2 MG tablet Take 2 mg by mouth 2 (two) times daily before a meal.     rosuvastatin (CRESTOR) 40 MG tablet Take 40 mg by mouth daily.     vitamin C (ASCORBIC ACID) 500 MG tablet Take 500 mg by mouth daily.     No current facility-administered medications for this visit.    REVIEW OF SYSTEMS:   Constitutional: ( - ) fevers, ( - )  chills , ( - ) night sweats Eyes: ( - ) blurriness of vision, ( - ) double vision, ( - ) watery eyes Ears, nose, mouth, throat, and face: ( - ) mucositis, ( - ) sore throat Respiratory: ( - ) cough, ( - ) dyspnea, ( - ) wheezes Cardiovascular: ( - ) palpitation, ( - ) chest discomfort, ( - ) lower extremity swelling Gastrointestinal:  ( - ) nausea, ( - ) heartburn, ( - ) change in bowel habits Skin: ( - ) abnormal skin rashes Lymphatics: ( - ) new lymphadenopathy, ( - ) easy bruising Neurological: ( - ) numbness, ( - ) tingling, ( - ) new weaknesses Behavioral/Psych: ( - ) mood change, ( - ) new changes  All other systems were reviewed with the patient and are negative.  PHYSICAL EXAMINATION: ECOG PERFORMANCE STATUS: 2 - Symptomatic, <50% confined to bed  There were no vitals filed for this visit.  There were no vitals filed for this visit.   GENERAL: well appearing elderly Caucasian male. alert, no distress and  comfortable SKIN: prior lesion on back on head resolved. Otherwise skin color, texture, turgor are normal, no rashes or significant lesions EYES: conjunctiva are pink and non-injected, sclera clear LUNGS: clear to auscultation and percussion with normal breathing effort HEART: regular rate & rhythm and no murmurs and no lower extremity edema ABDOMEN: Smaller left-sided lesion with some necrosis but no drainage. No overt signs of infection. Appears to be improving.  Consistent with lymphoma noted on PET CT scan. Musculoskeletal: no cyanosis of digits and no clubbing  PSYCH: alert & oriented x 3, fluent speech NEURO: no focal motor/sensory deficits  LABORATORY DATA:  I have reviewed the data as listed CBC Latest Ref Rng & Units 05/05/2021 04/28/2021 04/21/2021  WBC 4.0 - 10.5 K/uL 6.9 9.0 13.9(H)  Hemoglobin 13.0 - 17.0 g/dL 8.6(L) 7.6(L) 7.8(L)  Hematocrit 39.0 - 52.0 % 26.9(L) 23.8(L) 24.0(L)  Platelets 150 - 400 K/uL 65(L) 95(L) 26(L)    CMP Latest Ref Rng & Units 05/05/2021 04/28/2021 04/21/2021  Glucose 70 - 99 mg/dL 267(H) 258(H) 274(H)  BUN 8 - 23 mg/dL 35(H) 26(H) 33(H)  Creatinine 0.61 - 1.24 mg/dL 2.03(H) 2.15(H) 2.58(H)  Sodium 135 - 145 mmol/L 139 138 137  Potassium 3.5 - 5.1 mmol/L 4.5 4.5 4.2  Chloride 98 - 111 mmol/L 107 107 106  CO2 22 - 32 mmol/L _0 Calcium 8.9 - 10.3 mg/dL 8.5(L) 8.2(L) 8.2(L)  Total Protein 6.5 - 8.1 g/dL 5.4(L) 5.0(L) 4.9(L)  Total Bilirubin 0.3 - 1.2 mg/dL 0.4 0.3 0.3  Alkaline Phos 38 - 126 U/L 86 103 140(H)  AST 15 - 41 U/L _1 ALT 0 - 44 U/L _2 RADIOGRAPHIC STUDIES: I have personally reviewed the radiological images as listed and agreed with the findings in the report: involvement of the left abdomen, the kidney's bilaterally, and pulmonary nodule.   NM PET Image Restag (  PS) Skull Base To Thigh  Result Date: 05/03/2021 CLINICAL DATA:  Follow-up treatment strategy for high-grade B-cell lymphoma. EXAM: NUCLEAR MEDICINE PET  SKULL BASE TO THIGH TECHNIQUE: 9.5 mCi F-18 FDG was injected intravenously. Full-ring PET imaging was performed from the skull base to thigh after the radiotracer. CT data was obtained and used for attenuation correction and anatomic localization. Fasting blood glucose: 124 mg/dl COMPARISON:  PET-CT dated Jan 21, 2021 FINDINGS: Mediastinal blood pool activity: SUV max 2.3 Liver activity: SUV max 3.7 Head/neck: Interval decreased size of hypermetabolic lesion of the right posterior scalp now measuring 1.9 x 0.7 cm with an SUV max of 2.8, slightly above blood pool, previously measured 2.5 x 1.2 cm. Incidental CT findings: none CHEST: Interval decreased size of paratracheal and prevascular lymph nodes. Reference prevascular lymph node on series 4, image 53 measuring 3 mm with SUV max below mediastinal blood pool, previously 6 mm. Interval decreased size of subcarinal lymph node measuring 6 mm in short axis on image 71 with an SUV max of 3.4, previously 0.6 cm. Previously seen FDG avid solid left upper lobe nodule is no longer apparent. Incidental CT findings: Cardiomegaly. Fat deposition and calcification of the left ventricular apex compatible with prior LAD infarct. Three-vessel coronary artery calcifications. Atherosclerotic disease of the thoracic aorta. Left chest wall AICD with leads in the right atrium and right ventricle. Stable calcified nodule of the left lower lobe. ABDOMEN/PELVIS: Interval decreased size of bilateral renal lesions. Reference lesion along the cortex of the left kidney on image 116 measures 1.3 cm with an SUV max of 4.1, previously 2.3 cm. Interval decreased size of subcutaneous lesion of the left anterior abdominal wall measuring 7.0 x 4.3 cm with an SUV max of 33.6, previously 9.0 x 6.6 cm. Incidental CT findings: Prostatomegaly and bladder wall thickening, unchanged compared to prior exam. Atherosclerotic disease of the abdominal aorta. SKELETON: No focal hypermetabolic activity to suggest  skeletal metastasis. Incidental CT findings: none IMPRESSION: Mediastinal lymph nodes are decreased in size and demonstrate decreased FDG uptake. Soft tissue masses of the left anterior abdominal wall and posterior right scalp are decreased in size and demonstrate decreased FDG uptake. Interval resolution of hypermetabolic pulmonary nodule of the left upper lobe. Bilateral renal lesions are decreased in size and demonstrate decreased FDG uptake. Aortic Atherosclerosis (ICD10-I70.0). Electronically Signed   By: Yetta Glassman M.D.   On: 05/03/2021 15:03     ASSESSMENT & PLAN Rodney Lee 80 y.o. male with medical history significant for advanced stage high grade B cell lymphoma who presents for a follow up visit.   Given Rodney Lee's complicated medical history with cardiac dysfunction, renal dysfunction, and advanced age I am concerned that he would not be able to tolerate anthracycline-based therapy such as R mini CHOP.  As such the best regimen to proceed with given his comorbidities would be R-GCVP which is not toxic to the heart or filter to the kidney and can be used in patients with advanced age.  At this time we recommend proceeding with R-GCVP chemotherapy given his advanced age, cardiac dysfunction, and poor renal function.  Previously discussed the risks and benefits of treatment including nausea, vomiting, diarrhea, neuropathy, and allergic reactions.  Patient has an IPI showing he has high risk with lower rates of success with these treatment regimens.  Patient voices understanding of the risks and benefits of treatment was willing to proceed.  We will plan for 21 day cycles x 6 cycles. Rituximab $RemoveBeforeDEI'375mg'SYHpFmBNpjKMbTTG$ /m2 IV on  Day 1, Cyclophosphamide 750 mg /m2 on Day 1, Vincristine 1.18m/m2 (capped at 249m on Day 1, and Prednisone 604may 1-5 (protocol calls for prednisolone, but will adjust to lower dose prednisone). The Gemcitabine is to be administered on Day 1-8 and will be 750 mg/m2 IV over 30 minutes  once per day on days 1 & 8 for Cycle 1, 875 mg/m2 IV over 30 minutes once per day on days 1 & 8 for Cycle 2, and 1000 mg/m2 IV over 30 minutes once per day on days 1 & 8 for Cycle 3 and onward.   Unfortunately on 05/05/2021 the patient was found to have continued low counts.  PET/CT on 05/03/2021 shows persistent FDG activity consistent with a Deauville 5 in the superficial abdominal mass.  Given these findings I would recommend we pursue comfort based care only.  An additional offering could be radiation therapy to the mass itself for palliation.  Patient noted he would like to discuss this with his wife and family.  I did have the opportunity speak with his son TonNicole Kindredgarding our findings and the plan moving forward.  It sounds as though they are open to considering hospice based care, but would like to hear from radiation oncology regarding their offerings.  # High Grade B Cell Lymphoma, Stage III/IV --initial PET CT scan findings consistent with at least Stage III disease, with organ involvement most likely a Stage IV --avoiding anthracycline therapy and alternative regimens given his poor cardiac and renal function -- 02/04/2021 was Cycle 1 Day 1 of R-GCVP --Cycle 2 Day 8 was held due to hyperglycemia. Cycle  4 Day 8 held due to cytopenias.  --today was to be Cycle 5 Day 1 of R-GCVP, but we will hold given his persistently poor counts and poor PET CT findings.  Plan: --STOP chemotherapy in setting of poor blood counts and progression on disease on exam. Continued FDG avidity noted on PET, consistent with treatment failure --referral to Rad/Onc for consideration of palliative radiation to his superficial abdominal mass --began GOCTaylornversations with the family. They appear open to a comfort based approach given his difficultly to with treatment --RTC in 2-3 weeks to reassess.   # Skin Tear, improved # Fall from Standing -- The patient has all he needs to keep himself from falling including a  bedside commode, walker, and family support.  His falls occur when he attempts to walk to the restroom by himself without assistance.  Encourage him to ask for help. --no further bleeding, well healing at this time.  --No signs of infection --Continue to monitor.  #Chemotherapy Induced Anemia #Chemotherapy Induced Thrombocytopenia --Hgb 8.6 today --Plt 65 today --continue to monitor with transfusion support as needed.   #Supportive Care -- chemotherapy education complete -- port placed -- zofran 8mg18mH PRN and compazine 10mg83mq6H for nausea -- allopurinol 300mg 31maily for TLS prophylaxis -- EMLA cream for port -- no pain medication required at this time.   No orders of the defined types were placed in this encounter.  All questions were answered. The patient knows to call the clinic with any problems, questions or concerns.  A total of more than 30 minutes were spent on this encounter with face-to-face time and non-face-to-face time, including preparing to see the patient, ordering tests and/or medications, counseling the patient and coordination of care as outlined above.   Philomene Haff TLedell Peoplesepartment of Hematology/Oncology [facility]: [phone]:  [phone] Email: [email or web address]  05/10/2021 6:49 PM

## 2021-05-13 ENCOUNTER — Telehealth: Payer: Self-pay | Admitting: *Deleted

## 2021-05-13 ENCOUNTER — Other Ambulatory Visit: Payer: Self-pay | Admitting: Hematology and Oncology

## 2021-05-13 DIAGNOSIS — R630 Anorexia: Secondary | ICD-10-CM | POA: Diagnosis not present

## 2021-05-13 DIAGNOSIS — C8511 Unspecified B-cell lymphoma, lymph nodes of head, face, and neck: Secondary | ICD-10-CM | POA: Diagnosis not present

## 2021-05-13 DIAGNOSIS — E1122 Type 2 diabetes mellitus with diabetic chronic kidney disease: Secondary | ICD-10-CM | POA: Diagnosis not present

## 2021-05-13 DIAGNOSIS — C8510 Unspecified B-cell lymphoma, unspecified site: Secondary | ICD-10-CM

## 2021-05-13 DIAGNOSIS — E039 Hypothyroidism, unspecified: Secondary | ICD-10-CM | POA: Diagnosis not present

## 2021-05-13 DIAGNOSIS — I5022 Chronic systolic (congestive) heart failure: Secondary | ICD-10-CM | POA: Diagnosis not present

## 2021-05-13 DIAGNOSIS — I130 Hypertensive heart and chronic kidney disease with heart failure and stage 1 through stage 4 chronic kidney disease, or unspecified chronic kidney disease: Secondary | ICD-10-CM | POA: Diagnosis not present

## 2021-05-13 DIAGNOSIS — C8519 Unspecified B-cell lymphoma, extranodal and solid organ sites: Secondary | ICD-10-CM | POA: Diagnosis not present

## 2021-05-13 DIAGNOSIS — I25118 Atherosclerotic heart disease of native coronary artery with other forms of angina pectoris: Secondary | ICD-10-CM | POA: Diagnosis not present

## 2021-05-13 DIAGNOSIS — N184 Chronic kidney disease, stage 4 (severe): Secondary | ICD-10-CM | POA: Diagnosis not present

## 2021-05-13 NOTE — Telephone Encounter (Signed)
Received call from [place], pt's wife. She states that she and her husband the rest of the family have decided to try the radiation treatments to the tumor on his abdomen.  Advised that Dr. Lorenso Courier will send the referral today and they can expect to hear from the [facility] next week.  Hassan Rowan is grateful for this and voiced understanding,

## 2021-05-14 DIAGNOSIS — I130 Hypertensive heart and chronic kidney disease with heart failure and stage 1 through stage 4 chronic kidney disease, or unspecified chronic kidney disease: Secondary | ICD-10-CM | POA: Diagnosis not present

## 2021-05-14 DIAGNOSIS — E1122 Type 2 diabetes mellitus with diabetic chronic kidney disease: Secondary | ICD-10-CM | POA: Diagnosis not present

## 2021-05-14 DIAGNOSIS — I25118 Atherosclerotic heart disease of native coronary artery with other forms of angina pectoris: Secondary | ICD-10-CM | POA: Diagnosis not present

## 2021-05-14 DIAGNOSIS — C8511 Unspecified B-cell lymphoma, lymph nodes of head, face, and neck: Secondary | ICD-10-CM | POA: Diagnosis not present

## 2021-05-14 DIAGNOSIS — N184 Chronic kidney disease, stage 4 (severe): Secondary | ICD-10-CM | POA: Diagnosis not present

## 2021-05-14 DIAGNOSIS — C8519 Unspecified B-cell lymphoma, extranodal and solid organ sites: Secondary | ICD-10-CM | POA: Diagnosis not present

## 2021-05-14 DIAGNOSIS — E039 Hypothyroidism, unspecified: Secondary | ICD-10-CM | POA: Diagnosis not present

## 2021-05-14 DIAGNOSIS — I5022 Chronic systolic (congestive) heart failure: Secondary | ICD-10-CM | POA: Diagnosis not present

## 2021-05-14 DIAGNOSIS — R630 Anorexia: Secondary | ICD-10-CM | POA: Diagnosis not present

## 2021-05-17 NOTE — Progress Notes (Signed)
Lymphoma Location(s) / Histology: High grade B-Cell Lymphoma   PET 05/03/2021: Interval decreased size of bilateral renal lesions. Reference lesion along the cortex of the left kidney on image 116 measures 1.3 cm, previously 2.3 cm. Interval decreased size of subcutaneous lesion of the left anterior abdominal wall measuring 7.0 x 4.3 cm, previously 9.0 x 6.6 cm.   PET 01/21/2021: Within the anterior abdominal wall LEFT of midline intensely hypermetabolic mass is increased in size compared to recent CT measuring 9.9 x 6.6 cm compared to 6.6 by 4.1 cm. Mass is intensely hypermetabolic SUV max equal 51.  There is a new lesion along the lateral margin of the LEFT kidney measuring 2.1 cm, Second lesion extends from the upper pole of the LEFT kidney measuring approximately 28 mm.  Smaller lesions associated with the LEFT kidney on image 124 and similar lesion the posterior RIGHT kidney on the same image measures approximately 1 cm each.   CT abd/pelvis 12/09/2020: There is a rounded soft tissue mass of the subcutaneous ventral left hemiabdomen adjacent to the umbilicus, measuring 6.7 cm with adjacent soft tissue stranding.  Biopsy: 12/14/2020    Past/Anticipated interventions by medical oncology, if any:  Dr. Lorenso Courier 05/05/2021 -Chemo start 02/04/2021-04/2021 -Cycle 5 delayed due to cytopenias. -Chemotherapy held due to failure to respond to therapy noted on PET CT scan. Poor recovery of blood counts and continued deconditioning.  -At this time I have begun pursuing goals of care discussions. --referral to Rad/Onc for consideration of palliative radiation to his superficial abdominal mass --began [place] conversations with the family. They appear open to a comfort based approach given his difficultly to with treatment --RTC in 2-3 weeks to reassess.   Weight changes, if any, over the past 6 months: Has lost just a few pounds, reports his appetite has improved.  Recurrent fevers, or drenching night sweats,  if any: No  Pain: Denies abdominal pains  Bowels/Bladder:  Denies changes to bowels or bladder.  SAFETY ISSUES: Prior radiation? No Pacemaker/ICD? Yes, Dr. Lovena Le Possible current pregnancy? N/a Is the patient on methotrexate? No  Current Complaints / other details:   -Port insertion 01/28/2021

## 2021-05-18 ENCOUNTER — Ambulatory Visit
Admission: RE | Admit: 2021-05-18 | Discharge: 2021-05-18 | Disposition: A | Discharge status: Home / Self Care | Payer: Medicare HMO | Source: Ambulatory Visit | Attending: Radiation Oncology | Admitting: Radiation Oncology

## 2021-05-18 ENCOUNTER — Encounter: Payer: Self-pay | Admitting: Radiation Oncology

## 2021-05-18 ENCOUNTER — Other Ambulatory Visit: Payer: Self-pay

## 2021-05-18 VITALS — BP 95/51 | HR 80 | Temp 97.5°F | Resp 20 | Ht 70.5 in | Wt 187.0 lb

## 2021-05-18 DIAGNOSIS — I509 Heart failure, unspecified: Secondary | ICD-10-CM | POA: Diagnosis not present

## 2021-05-18 DIAGNOSIS — E1122 Type 2 diabetes mellitus with diabetic chronic kidney disease: Secondary | ICD-10-CM | POA: Diagnosis not present

## 2021-05-18 DIAGNOSIS — Z794 Long term (current) use of insulin: Secondary | ICD-10-CM | POA: Insufficient documentation

## 2021-05-18 DIAGNOSIS — R001 Bradycardia, unspecified: Secondary | ICD-10-CM | POA: Diagnosis not present

## 2021-05-18 DIAGNOSIS — I4891 Unspecified atrial fibrillation: Secondary | ICD-10-CM | POA: Diagnosis not present

## 2021-05-18 DIAGNOSIS — Z79899 Other long term (current) drug therapy: Secondary | ICD-10-CM | POA: Insufficient documentation

## 2021-05-18 DIAGNOSIS — C8519 Unspecified B-cell lymphoma, extranodal and solid organ sites: Secondary | ICD-10-CM | POA: Diagnosis not present

## 2021-05-18 DIAGNOSIS — Z9221 Personal history of antineoplastic chemotherapy: Secondary | ICD-10-CM | POA: Diagnosis not present

## 2021-05-18 DIAGNOSIS — E119 Type 2 diabetes mellitus without complications: Secondary | ICD-10-CM | POA: Insufficient documentation

## 2021-05-18 DIAGNOSIS — R410 Disorientation, unspecified: Secondary | ICD-10-CM | POA: Diagnosis not present

## 2021-05-18 DIAGNOSIS — I959 Hypotension, unspecified: Secondary | ICD-10-CM | POA: Insufficient documentation

## 2021-05-18 DIAGNOSIS — I252 Old myocardial infarction: Secondary | ICD-10-CM | POA: Diagnosis not present

## 2021-05-18 DIAGNOSIS — C792 Secondary malignant neoplasm of skin: Secondary | ICD-10-CM | POA: Diagnosis not present

## 2021-05-18 DIAGNOSIS — C8336 Diffuse large B-cell lymphoma, intrapelvic lymph nodes: Secondary | ICD-10-CM | POA: Diagnosis not present

## 2021-05-18 DIAGNOSIS — I130 Hypertensive heart and chronic kidney disease with heart failure and stage 1 through stage 4 chronic kidney disease, or unspecified chronic kidney disease: Secondary | ICD-10-CM | POA: Diagnosis not present

## 2021-05-18 DIAGNOSIS — R630 Anorexia: Secondary | ICD-10-CM | POA: Diagnosis not present

## 2021-05-18 DIAGNOSIS — I2510 Atherosclerotic heart disease of native coronary artery without angina pectoris: Secondary | ICD-10-CM | POA: Diagnosis not present

## 2021-05-18 DIAGNOSIS — C8510 Unspecified B-cell lymphoma, unspecified site: Secondary | ICD-10-CM | POA: Diagnosis not present

## 2021-05-18 DIAGNOSIS — E785 Hyperlipidemia, unspecified: Secondary | ICD-10-CM | POA: Insufficient documentation

## 2021-05-18 DIAGNOSIS — I700 Atherosclerosis of aorta: Secondary | ICD-10-CM | POA: Insufficient documentation

## 2021-05-18 DIAGNOSIS — E039 Hypothyroidism, unspecified: Secondary | ICD-10-CM | POA: Insufficient documentation

## 2021-05-18 DIAGNOSIS — I10 Essential (primary) hypertension: Secondary | ICD-10-CM | POA: Diagnosis not present

## 2021-05-18 DIAGNOSIS — C8511 Unspecified B-cell lymphoma, lymph nodes of head, face, and neck: Secondary | ICD-10-CM | POA: Diagnosis not present

## 2021-05-18 DIAGNOSIS — I5022 Chronic systolic (congestive) heart failure: Secondary | ICD-10-CM | POA: Diagnosis not present

## 2021-05-18 DIAGNOSIS — N184 Chronic kidney disease, stage 4 (severe): Secondary | ICD-10-CM | POA: Diagnosis not present

## 2021-05-18 DIAGNOSIS — I25118 Atherosclerotic heart disease of native coronary artery with other forms of angina pectoris: Secondary | ICD-10-CM | POA: Diagnosis not present

## 2021-05-18 NOTE — Progress Notes (Signed)
Radiation Oncology         [phone] ________________________________  Name: Rodney Lee        MRN: [id number]  Date of Service: 05/18/2021 DOB: 1941/01/27  VX:[id number], Rodney Right, MD  Rodney Slick, MD     REFERRING PHYSICIAN: Orson Slick, MD   DIAGNOSIS: The encounter diagnosis was High grade B-cell lymphoma ([place]).   HISTORY OF PRESENT ILLNESS: Rodney Lee is a 80 y.o. male seen at the request of Dr. Lorenso Courier for a diagnosis of high-grade B-cell lymphoma.  The patient was originally diagnosed in the spring 2022 when a CT abdomen pelvis showed a 6.7 cm mass and punch biopsy on 12/14/2020 showed a high-grade B-cell lymphoma.  Pet scan in May 2022 showed diffuse disease in the anterior wall of the abdomen, disease along the left and Lee kidney and activity in a pulmonary nodule.  He began our-G CVP chemotherapy on 02/04/2021 cycle 5 in August 2022 was delayed due to cytopenia.  He had restaging PET on 05/03/2021 that showed mediastinal and left anterior abdominal wall and posterior Lee scalp disease were again decreased.  Interval resolution of the hypermetabolic pulmonary nodule in the left upper lobe was also noted and decrease in his renal disease as well.  This disease however did not resolve completely and it was very difficult to proceed with additional chemotherapy due to pancytopenia is cardiac and renal function.  The decision has been to forego systemic treatment to discuss goals of care and to consider palliative radiotherapy for the abdominal wall mass.   PREVIOUS RADIATION THERAPY: No   PAST MEDICAL HISTORY:  Past Medical History:  Diagnosis Date   Atrial fibrillation ([place])    CHADS2Vasc is at least 5, on Xarelto   CAD (coronary artery disease)    cardiomyopathy    MDT ICD, Dr. Lovena Le 2009   CHF (congestive heart failure) ([place])    Diabetes mellitus    Exogenous obesity    Hyperlipidemia    Hypertension    Hypothyroidism    MI, old 25   ANTEROLATERAL,  PTCA LAD per pt   Sinus bradycardia        PAST SURGICAL HISTORY: Past Surgical History:  Procedure Laterality Date   ANGIOPLASTY     CARDIAC CATHETERIZATION  03/13/1994   ACUTE MI WITH TOTAL OCCLUSION OF MID LAD. REPERFUSION AFTER CROSSING WITH A GUIDEWIRE. POOR Lee TO LEFT COLLATERAL FLOW. Pt states had PTCA   CARDIAC DEFIBRILLATOR PLACEMENT     CARDIOVASCULAR STRESS TEST  03/11/2008   EF 30%. NO REVERSIBLE ISCHEMIA   EP IMPLANTABLE DEVICE N/A 09/28/2015   Procedure:  ICD Generator Changeout;  Surgeon: Evans Lance, MD;  Location: [facility];  Service: Cardiovascular;  Laterality: N/A;   IR IMAGING GUIDED PORT INSERTION  01/28/2021   US ECHOCARDIOGRAPHY  04/26/2010   EF 35-40%. MODERATE LVH WITH MODERATE GLOBAL LV SYSTOLIC DYSFUNCTION AND IMPAIRED RELAXATION. MILD AORTIC SCLEROSIS WITHOUT STENOSIS. NORMAL PULMONARY ARTERY PRESSURE.   US ECHOCARDIOGRAPHY  10/07/2004   EF 30-35%     FAMILY HISTORY:  Family History  Problem Relation Age of Onset   Heart disease Mother    Heart attack Mother      SOCIAL HISTORY:  reports that he has never smoked. He has never used smokeless tobacco. He reports that he does not drink alcohol and does not use drugs.   ALLERGIES: Canagliflozin and Lipitor [atorvastatin calcium]   MEDICATIONS:  Current Outpatient Medications  Medication  Sig Dispense Refill   allopurinol (ZYLOPRIM) 300 MG tablet Take 1 tablet (300 mg total) by mouth daily. 90 tablet 1   amiodarone (PACERONE) 200 MG tablet Take 1 tablet (200 mg total) by mouth daily. 7 tablet 0   blood glucose meter kit and supplies Dispense based on patient and insurance preference. Use up to four times daily as directed. (FOR ICD-10 E10.9, E11.9). 1 each 0   carvedilol (COREG) 6.25 MG tablet TAKE 1 TABLET BY MOUTH  TWICE DAILY WITH A MEAL 180 tablet 2   citalopram (CELEXA) 20 MG tablet Take 20 mg by mouth daily.     glimepiride (AMARYL) 1 MG tablet Take 1 mg by mouth daily with breakfast.      insulin lispro (HUMALOG KWIKPEN) 100 UNIT/ML KwikPen Use following sliding scale provided, at meal times as long as eating > 50% of your meal, up to three times daily. 15 mL 0   Insulin Pen Needle 31G X 5 MM MISC Use with insulin up to three times daily with meals 100 each 0   isosorbide mononitrate (IMDUR) 60 MG 24 hr tablet Take 60 mg in the morning and 30 mg (half a tablet) in the evening. 135 tablet 3   levothyroxine (SYNTHROID) 125 MCG tablet Take 125 mcg by mouth daily.     lidocaine-prilocaine (EMLA) cream APPLY  CREAM TOPICALLY AS NEEDED 30 g 0   Multiple Vitamin (MULTIVITAMIN) tablet Take 1 tablet by mouth daily.     nitroGLYCERIN (NITROSTAT) 0.4 MG SL tablet Place 1 tablet (0.4 mg total) under the tongue every 5 (five) minutes as needed for chest pain. 25 tablet 2   ONETOUCH ULTRA test strip USE 1 STRIP TO CHECK GLUCOSE AS DIRECTED     predniSONE (DELTASONE) 20 MG tablet Take 3 tablets (60 mg total) by mouth as directed. Take 60 mg  ( 3 tablets) by mouth every morning on Days 1 to 5 of chemotherapy. 15 tablet 5   repaglinide (PRANDIN) 2 MG tablet Take 2 mg by mouth 2 (two) times daily before a meal.     rosuvastatin (CRESTOR) 40 MG tablet Take 40 mg by mouth daily.     vitamin C (ASCORBIC ACID) 500 MG tablet Take 500 mg by mouth daily.     ondansetron (ZOFRAN) 8 MG tablet Take 1 tablet (8 mg total) by mouth every 8 (eight) hours as needed for nausea or vomiting. (Patient not taking: Reported on 05/18/2021) 30 tablet 0   prochlorperazine (COMPAZINE) 10 MG tablet TAKE 1 TABLET BY MOUTH EVERY 6 HOURS AS NEEDED FOR NAUSEA OR  VOMITING (Patient not taking: Reported on 05/18/2021) 30 tablet 0   No current facility-administered medications for this encounter.     REVIEW OF SYSTEMS: On review of systems, the patient reports that he is doing okay. He has had improvement in the scalp lesion and this has almost completely resolved. He has unfortunately though had progression of his abdominal  wall metastasis that is similar to how it looked prior to chemotherapy. In the early phase of treatment this improved, but in the last few weeks has returned to similar size.He does not have bleeding or drainage from the site, but early on did have some bleeding. The skin is tight but he denies any abdominal pain.  He has had some confusion in short term memory such as with appointments and his wife notes he talks in his sleep but sometimes is moving his arms or feet a night time. These changes have  been present for several months. No other acute changes in movement, complaints of headache, gait instability, or trouble with vision or hearing have been noted. No other complaints are verbalized.      PHYSICAL EXAM:  Wt Readings from Last 3 Encounters:  05/18/21 187 lb (84.8 kg)  04/28/21 189 lb 9.6 oz (86 kg)  04/14/21 186 lb 4 oz (84.5 kg)   Temp Readings from Last 3 Encounters:  05/18/21 (!) 97.5 F (36.4 C)  05/05/21 98.7 F (37.1 C) (Oral)  04/28/21 98.1 F (36.7 C) (Oral)   BP Readings from Last 3 Encounters:  05/18/21 (!) 95/51  05/05/21 (!) 99/52  04/28/21 (!) 86/42   Pulse Readings from Last 3 Encounters:  05/18/21 80  05/05/21 62  04/28/21 65   Pain Assessment Pain Score: 0-No pain/10  In general this is a well appearing caucasian male in no acute distress. He's alert and oriented x4 and appropriate throughout the examination. Cardiopulmonary assessment is negative for acute distress and he exhibits normal effort. His posterior scalp shows resolution of any visible tumor, but hyperpigmentation and retraction of the skin showing the prior site of defect is noted. His abdominal wall has a large lobulated mass up to 5 1/2 cm across that is intact with thin skin and hyperpigmentation and eschar along the crevices.          ECOG = 1  0 - Asymptomatic (Fully active, able to carry on all predisease activities without restriction)  1 - Symptomatic but completely ambulatory  (Restricted in physically strenuous activity but ambulatory and able to carry out work of a light or sedentary nature. For example, light housework, office work)  2 - Symptomatic, <50% in bed during the day (Ambulatory and capable of all self care but unable to carry out any work activities. Up and about more than 50% of waking hours)  3 - Symptomatic, >50% in bed, but not bedbound (Capable of only limited self-care, confined to bed or chair 50% or more of waking hours)  4 - Bedbound (Completely disabled. Cannot carry on any self-care. Totally confined to bed or chair)  5 - Death   Eustace Pen MM, Creech RH, Tormey DC, et al. [id number]). "Toxicity and response criteria of the [facility]". [place] Oncol. 5 (6): 649-55    LABORATORY DATA:  Lab Results  Component Value Date   WBC 6.9 05/05/2021   HGB 8.6 (L) 05/05/2021   HCT 26.9 (L) 05/05/2021   MCV 106.3 (H) 05/05/2021   PLT 65 (L) 05/05/2021   Lab Results  Component Value Date   NA 139 05/05/2021   K 4.5 05/05/2021   CL 107 05/05/2021   CO2 25 05/05/2021   Lab Results  Component Value Date   ALT 22 05/05/2021   AST 28 05/05/2021   ALKPHOS 86 05/05/2021   BILITOT 0.4 05/05/2021      RADIOGRAPHY: NM PET Image Restag (PS) Skull Base To Thigh  Result Date: 05/03/2021 CLINICAL DATA:  Follow-up treatment strategy for high-grade B-cell lymphoma. EXAM: NUCLEAR MEDICINE PET SKULL BASE TO THIGH TECHNIQUE: 9.5 mCi F-18 FDG was injected intravenously. Full-ring PET imaging was performed from the skull base to thigh after the radiotracer. CT data was obtained and used for attenuation correction and anatomic localization. Fasting blood glucose: 124 mg/dl COMPARISON:  PET-CT dated Jan 21, 2021 FINDINGS: Mediastinal blood pool activity: SUV max 2.3 Liver activity: SUV max 3.7 Head/neck: Interval decreased size of hypermetabolic lesion of the Lee posterior  scalp now measuring 1.9 x 0.7 cm with an SUV max of 2.8, slightly  above blood pool, previously measured 2.5 x 1.2 cm. Incidental CT findings: none CHEST: Interval decreased size of paratracheal and prevascular lymph nodes. Reference prevascular lymph node on series 4, image 53 measuring 3 mm with SUV max below mediastinal blood pool, previously 6 mm. Interval decreased size of subcarinal lymph node measuring 6 mm in short axis on image 71 with an SUV max of 3.4, previously 0.6 cm. Previously seen FDG avid solid left upper lobe nodule is no longer apparent. Incidental CT findings: Cardiomegaly. Fat deposition and calcification of the left ventricular apex compatible with prior LAD infarct. Three-vessel coronary artery calcifications. Atherosclerotic disease of the thoracic aorta. Left chest wall AICD with leads in the Lee atrium and Lee ventricle. Stable calcified nodule of the left lower lobe. ABDOMEN/PELVIS: Interval decreased size of bilateral renal lesions. Reference lesion along the cortex of the left kidney on image 116 measures 1.3 cm with an SUV max of 4.1, previously 2.3 cm. Interval decreased size of subcutaneous lesion of the left anterior abdominal wall measuring 7.0 x 4.3 cm with an SUV max of 33.6, previously 9.0 x 6.6 cm. Incidental CT findings: Prostatomegaly and bladder wall thickening, unchanged compared to prior exam. Atherosclerotic disease of the abdominal aorta. SKELETON: No focal hypermetabolic activity to suggest skeletal metastasis. Incidental CT findings: none IMPRESSION: Mediastinal lymph nodes are decreased in size and demonstrate decreased FDG uptake. Soft tissue masses of the left anterior abdominal wall and posterior Lee scalp are decreased in size and demonstrate decreased FDG uptake. Interval resolution of hypermetabolic pulmonary nodule of the left upper lobe. Bilateral renal lesions are decreased in size and demonstrate decreased FDG uptake. Aortic Atherosclerosis (ICD10-I70.0). Electronically Signed   By: Yetta Glassman M.D.   On:  05/03/2021 15:03       IMPRESSION/PLAN: 1. Progressive metastatic high grade B cell Lymphoma. Dr. Lisbeth Renshaw discusses the pathology findings and reviews the nature of metastatic disease to the abdominal wall and reviews the radiosensitive nature of lymphoma to palliate his disease. Dr. Lisbeth Renshaw would not favor treating the scalp at this time as no visible tumor is noted grossly.  We discussed the risks, benefits, short, and long term effects of radiotherapy, as well as the palliative intent, and the patient is interested in proceeding. Dr. Lisbeth Renshaw discusses the delivery and logistics of radiotherapy and anticipates a course of 2 weeks of radiotherapy. Written consent is obtained and placed in the chart, a copy was provided to the patient's wife as she is his next of kin and signs his paper work now. They will be contacted to coordinate simulation. We did discuss that the long term plan was for hospice based care as he is not a candidate for more chemotherapy. His wife is in agreement with referral to hospice once completing radiotherapy. We will coordinate through hospice of the [place] and perhaps he can start with palliative care and transition when completed with radiation to hospice. Orders for this referral will be placed. 2. Hypotension. I recommended that he follow up with his PCP for further management of this. Perhaps this could be causing episodes of confusion if his pressures are dropping further.  3. In Situ ICD. We will reach out to his cardiologist but would be greater than 10 cm from his device and will anticipate a safe therapy to his abdominal tumor. 4. Confusion. Dr. Lisbeth Renshaw reviews his symptoms. It is unclear the source of his confusion. With  plans to proceed with treatment, if his symptoms progressed or new acute neurologic symptoms were noted he would consider an MRI of the brain. The patient's wife will keep [place] informed of clinical changes.    The above documentation reflects my direct findings  during this shared patient visit. Please see the separate note by Dr. Lisbeth Renshaw on this date for the remainder of the patient's plan of care.    Carola Rhine, [facility]   **Disclaimer: This note was dictated with voice recognition software. Similar sounding words can inadvertently be transcribed and this note may contain transcription errors which may not have been corrected upon publication of note.**

## 2021-05-19 ENCOUNTER — Telehealth: Payer: Self-pay | Admitting: Cardiovascular Disease

## 2021-05-19 DIAGNOSIS — C8519 Unspecified B-cell lymphoma, extranodal and solid organ sites: Secondary | ICD-10-CM | POA: Diagnosis not present

## 2021-05-19 DIAGNOSIS — N184 Chronic kidney disease, stage 4 (severe): Secondary | ICD-10-CM | POA: Diagnosis not present

## 2021-05-19 DIAGNOSIS — E1122 Type 2 diabetes mellitus with diabetic chronic kidney disease: Secondary | ICD-10-CM | POA: Diagnosis not present

## 2021-05-19 DIAGNOSIS — E039 Hypothyroidism, unspecified: Secondary | ICD-10-CM | POA: Diagnosis not present

## 2021-05-19 DIAGNOSIS — I5022 Chronic systolic (congestive) heart failure: Secondary | ICD-10-CM | POA: Diagnosis not present

## 2021-05-19 DIAGNOSIS — I25118 Atherosclerotic heart disease of native coronary artery with other forms of angina pectoris: Secondary | ICD-10-CM | POA: Diagnosis not present

## 2021-05-19 DIAGNOSIS — C8511 Unspecified B-cell lymphoma, lymph nodes of head, face, and neck: Secondary | ICD-10-CM | POA: Diagnosis not present

## 2021-05-19 DIAGNOSIS — R630 Anorexia: Secondary | ICD-10-CM | POA: Diagnosis not present

## 2021-05-19 DIAGNOSIS — I130 Hypertensive heart and chronic kidney disease with heart failure and stage 1 through stage 4 chronic kidney disease, or unspecified chronic kidney disease: Secondary | ICD-10-CM | POA: Diagnosis not present

## 2021-05-19 NOTE — Telephone Encounter (Signed)
Croitoru, Dani Gobble, MD  Sent: Wed May 19, 2021 11:14 AM  To: Nuala Alpha, LPN; Evans Lance, MD; Kyung Rudd, MD; Hayden Pedro, PA-C          Message  I think from a general Cardiology standpoint Mr. Nori Riis is an appropriate radiation therapy candidate. I also believe that there should be no problems with his ICD and radiation therapy (planned to the abdomen), but am sending this message to Dr. Lovena Le as well, since he monitors the device.    Spoke with the pts wife and informed her of Dr. Victorino December recommendations, as indicated above.  Informed her that Dr. Sallyanne Kuster is going to run this by Dr. Lovena Le who follows her husbands ICD, and just double check to make sure there are no problems from the pts device end, with starting radiation therapy.  Informed the pts wife that Dr. Sallyanne Kuster has included the pts other treating Providers in on this message as well, to keep them updated. Wife verbalized understanding and agrees with this plan.  Wife was more than gracious for all the assistance provided.

## 2021-05-19 NOTE — Telephone Encounter (Signed)
Pts wife Hassan Rowan (on [place]), is calling to let Dr. Sallyanne Kuster and RN know that the pt will no longer be able to do chemo treatment, and will start radiation hopefully next week. Wife states from what she gathers, the pts Oncologist Dr. Lisbeth Renshaw and Dr. Lorenso Courier (can see their notes in epic), advised that the pt is no longer fit for chemo, due to being too weak and low BP readings.   Wife states they have advised for the pt to start radiation instead, but will need official cardiac clearance from Dr. Sallyanne Kuster, before starting.  Wife states both Oncologist would like to make sure with Dr. Sallyanne Kuster that the pt is safe from a cardiac perspective to start radiation. Wife states they will be going for consultation for this next Monday 9/19, and will hopefully be able to get the pt started on radiation then, pending clearance from Dr. Sallyanne Kuster.  Wife states the pts BP readings stay in the 63'F systolic and 35'K diastolic.  Wife states his last BP recorded from this week was 95/51, which she reports has been his baseline for quite sometime.  Wife would like for Dr. Sallyanne Kuster to advise if the pt is safe from a cardiac standpoint, to receive radiation. Informed the pts wife that I will route this message to Dr. Sallyanne Kuster and RN for further review and advisement.  Informed the pts wife that Dr. Victorino December RN will follow-up with them accordingly thereafter. Wife verbalized understanding and agrees with this plan.

## 2021-05-19 NOTE — Telephone Encounter (Signed)
Wife of the patient called. The patient has Lymphoma and can no longer tolerate Chemo. The patient will need to start Radiation.  The wife was told to check with Dr. Loletha Grayer to see if it is safe for the patient to start radiation.  Please contact the patient's wife

## 2021-05-21 ENCOUNTER — Other Ambulatory Visit (HOSPITAL_COMMUNITY): Payer: Self-pay

## 2021-05-21 ENCOUNTER — Telehealth: Payer: Self-pay | Admitting: Hematology and Oncology

## 2021-05-21 NOTE — Telephone Encounter (Signed)
Sch per 9/16 in bnasket spoke with wife

## 2021-05-24 ENCOUNTER — Other Ambulatory Visit: Payer: Self-pay

## 2021-05-24 ENCOUNTER — Ambulatory Visit
Admission: RE | Admit: 2021-05-24 | Discharge: 2021-05-24 | Disposition: A | Discharge status: Home / Self Care | Payer: Medicare HMO | Source: Ambulatory Visit | Attending: Radiation Oncology | Admitting: Radiation Oncology

## 2021-05-24 DIAGNOSIS — C8519 Unspecified B-cell lymphoma, extranodal and solid organ sites: Secondary | ICD-10-CM | POA: Diagnosis not present

## 2021-05-24 DIAGNOSIS — E1122 Type 2 diabetes mellitus with diabetic chronic kidney disease: Secondary | ICD-10-CM | POA: Diagnosis not present

## 2021-05-24 DIAGNOSIS — C8510 Unspecified B-cell lymphoma, unspecified site: Secondary | ICD-10-CM | POA: Diagnosis not present

## 2021-05-24 DIAGNOSIS — C8511 Unspecified B-cell lymphoma, lymph nodes of head, face, and neck: Secondary | ICD-10-CM | POA: Diagnosis not present

## 2021-05-24 DIAGNOSIS — E039 Hypothyroidism, unspecified: Secondary | ICD-10-CM | POA: Diagnosis not present

## 2021-05-24 DIAGNOSIS — C8336 Diffuse large B-cell lymphoma, intrapelvic lymph nodes: Secondary | ICD-10-CM | POA: Diagnosis not present

## 2021-05-24 DIAGNOSIS — Z510 Encounter for antineoplastic radiation therapy: Secondary | ICD-10-CM | POA: Diagnosis not present

## 2021-05-24 DIAGNOSIS — I25118 Atherosclerotic heart disease of native coronary artery with other forms of angina pectoris: Secondary | ICD-10-CM | POA: Diagnosis not present

## 2021-05-24 DIAGNOSIS — I5022 Chronic systolic (congestive) heart failure: Secondary | ICD-10-CM | POA: Diagnosis not present

## 2021-05-24 DIAGNOSIS — N184 Chronic kidney disease, stage 4 (severe): Secondary | ICD-10-CM | POA: Diagnosis not present

## 2021-05-24 DIAGNOSIS — I130 Hypertensive heart and chronic kidney disease with heart failure and stage 1 through stage 4 chronic kidney disease, or unspecified chronic kidney disease: Secondary | ICD-10-CM | POA: Diagnosis not present

## 2021-05-24 DIAGNOSIS — C792 Secondary malignant neoplasm of skin: Secondary | ICD-10-CM | POA: Diagnosis not present

## 2021-05-24 DIAGNOSIS — R630 Anorexia: Secondary | ICD-10-CM | POA: Diagnosis not present

## 2021-05-24 NOTE — Telephone Encounter (Addendum)
Spoke with the pts wife Hassan Rowan, and informed her that per Dr. Lovena Le, he states the pt is good to go to receive his radiation therapy. Hassan Rowan verbalized understanding and agrees with this plan.  Hassan Rowan was more than gracious for all the assistance provided.    Aarian, Griffie" - 05/19/2021 10:26 AM Evans Lance, MD  Sent: Sun May 23, 2021  8:34 PM  To: Nuala Alpha, LPN          Message  He may proceed with XRT. GT

## 2021-05-25 DIAGNOSIS — C792 Secondary malignant neoplasm of skin: Secondary | ICD-10-CM | POA: Diagnosis not present

## 2021-05-25 DIAGNOSIS — C8510 Unspecified B-cell lymphoma, unspecified site: Secondary | ICD-10-CM | POA: Diagnosis not present

## 2021-05-25 DIAGNOSIS — C8336 Diffuse large B-cell lymphoma, intrapelvic lymph nodes: Secondary | ICD-10-CM | POA: Diagnosis not present

## 2021-05-25 DIAGNOSIS — Z510 Encounter for antineoplastic radiation therapy: Secondary | ICD-10-CM | POA: Diagnosis not present

## 2021-05-26 ENCOUNTER — Ambulatory Visit
Admission: RE | Admit: 2021-05-26 | Discharge: 2021-05-26 | Disposition: A | Discharge status: Home / Self Care | Payer: Medicare HMO | Source: Ambulatory Visit | Attending: Radiation Oncology | Admitting: Radiation Oncology

## 2021-05-26 ENCOUNTER — Other Ambulatory Visit: Payer: Self-pay

## 2021-05-26 ENCOUNTER — Telehealth: Payer: Self-pay | Admitting: *Deleted

## 2021-05-26 DIAGNOSIS — C8510 Unspecified B-cell lymphoma, unspecified site: Secondary | ICD-10-CM | POA: Diagnosis not present

## 2021-05-26 DIAGNOSIS — C792 Secondary malignant neoplasm of skin: Secondary | ICD-10-CM | POA: Diagnosis not present

## 2021-05-26 DIAGNOSIS — C8336 Diffuse large B-cell lymphoma, intrapelvic lymph nodes: Secondary | ICD-10-CM | POA: Diagnosis not present

## 2021-05-26 DIAGNOSIS — Z510 Encounter for antineoplastic radiation therapy: Secondary | ICD-10-CM | POA: Diagnosis not present

## 2021-05-26 NOTE — Telephone Encounter (Signed)
Received vm message from pt today from pt's wife. She is requesting a call back. Called her and spoke with her. She states that her husband will be starting radiation to his abdomen this week and was told he might experience some nausea/diarrhea with his treatments.  Advised that she can give him either the compazine or the ondansetron for nausea that he already has at home. Also advised to get some imodium to have on hand for the potential for diarrhea. She voiced understanding. She also wanted to know if he should keep his appt for a new PCP next week. Advised that he should in case something comes up with his health that is not related to his lymphoma.  The last question was about his abdominal tumor growth (starting radiation for this)  She states it is starting to "leak"  Advised to stop by tomorrow and that I would provide some dressing supplies for her to use.  Advised it make leak more during the radiation. She voiced understanding to all of the above. She is aware of his upcoming appts.

## 2021-05-27 ENCOUNTER — Ambulatory Visit
Admission: RE | Admit: 2021-05-27 | Discharge: 2021-05-27 | Disposition: A | Discharge status: Home / Self Care | Payer: Medicare HMO | Source: Ambulatory Visit | Attending: Radiation Oncology | Admitting: Radiation Oncology

## 2021-05-27 DIAGNOSIS — C8510 Unspecified B-cell lymphoma, unspecified site: Secondary | ICD-10-CM | POA: Diagnosis not present

## 2021-05-27 DIAGNOSIS — Z510 Encounter for antineoplastic radiation therapy: Secondary | ICD-10-CM | POA: Diagnosis not present

## 2021-05-27 DIAGNOSIS — C792 Secondary malignant neoplasm of skin: Secondary | ICD-10-CM | POA: Diagnosis not present

## 2021-05-27 DIAGNOSIS — C8336 Diffuse large B-cell lymphoma, intrapelvic lymph nodes: Secondary | ICD-10-CM | POA: Diagnosis not present

## 2021-05-28 ENCOUNTER — Other Ambulatory Visit: Payer: Self-pay

## 2021-05-28 ENCOUNTER — Ambulatory Visit
Admission: RE | Admit: 2021-05-28 | Discharge: 2021-05-28 | Disposition: A | Discharge status: Home / Self Care | Payer: Medicare HMO | Source: Ambulatory Visit | Attending: Radiation Oncology | Admitting: Radiation Oncology

## 2021-05-28 DIAGNOSIS — C8510 Unspecified B-cell lymphoma, unspecified site: Secondary | ICD-10-CM | POA: Diagnosis not present

## 2021-05-28 DIAGNOSIS — C8336 Diffuse large B-cell lymphoma, intrapelvic lymph nodes: Secondary | ICD-10-CM | POA: Diagnosis not present

## 2021-05-28 DIAGNOSIS — Z510 Encounter for antineoplastic radiation therapy: Secondary | ICD-10-CM | POA: Diagnosis not present

## 2021-05-28 DIAGNOSIS — C792 Secondary malignant neoplasm of skin: Secondary | ICD-10-CM | POA: Diagnosis not present

## 2021-05-31 ENCOUNTER — Ambulatory Visit
Admission: RE | Admit: 2021-05-31 | Discharge: 2021-05-31 | Disposition: A | Discharge status: Home / Self Care | Payer: Medicare HMO | Source: Ambulatory Visit | Attending: Radiation Oncology | Admitting: Radiation Oncology

## 2021-05-31 ENCOUNTER — Other Ambulatory Visit: Payer: Self-pay

## 2021-05-31 DIAGNOSIS — C8510 Unspecified B-cell lymphoma, unspecified site: Secondary | ICD-10-CM | POA: Diagnosis not present

## 2021-05-31 DIAGNOSIS — C792 Secondary malignant neoplasm of skin: Secondary | ICD-10-CM | POA: Diagnosis not present

## 2021-05-31 DIAGNOSIS — Z510 Encounter for antineoplastic radiation therapy: Secondary | ICD-10-CM | POA: Diagnosis not present

## 2021-05-31 DIAGNOSIS — C8336 Diffuse large B-cell lymphoma, intrapelvic lymph nodes: Secondary | ICD-10-CM | POA: Diagnosis not present

## 2021-06-01 ENCOUNTER — Ambulatory Visit
Admission: RE | Admit: 2021-06-01 | Discharge: 2021-06-01 | Disposition: A | Discharge status: Home / Self Care | Payer: Medicare HMO | Source: Ambulatory Visit | Attending: Radiation Oncology | Admitting: Radiation Oncology

## 2021-06-01 ENCOUNTER — Ambulatory Visit (INDEPENDENT_AMBULATORY_CARE_PROVIDER_SITE_OTHER): Payer: Medicare HMO | Admitting: Internal Medicine

## 2021-06-01 ENCOUNTER — Encounter: Payer: Self-pay | Admitting: Internal Medicine

## 2021-06-01 VITALS — BP 118/50 | HR 58 | Temp 98.5°F | Ht 70.5 in | Wt 186.0 lb

## 2021-06-01 DIAGNOSIS — Z23 Encounter for immunization: Secondary | ICD-10-CM

## 2021-06-01 DIAGNOSIS — C8336 Diffuse large B-cell lymphoma, intrapelvic lymph nodes: Secondary | ICD-10-CM | POA: Diagnosis not present

## 2021-06-01 DIAGNOSIS — E0822 Diabetes mellitus due to underlying condition with diabetic chronic kidney disease: Secondary | ICD-10-CM | POA: Diagnosis not present

## 2021-06-01 DIAGNOSIS — N1832 Chronic kidney disease, stage 3b: Secondary | ICD-10-CM | POA: Diagnosis not present

## 2021-06-01 DIAGNOSIS — Z510 Encounter for antineoplastic radiation therapy: Secondary | ICD-10-CM | POA: Diagnosis not present

## 2021-06-01 DIAGNOSIS — D539 Nutritional anemia, unspecified: Secondary | ICD-10-CM | POA: Diagnosis not present

## 2021-06-01 DIAGNOSIS — E039 Hypothyroidism, unspecified: Secondary | ICD-10-CM | POA: Diagnosis not present

## 2021-06-01 DIAGNOSIS — C792 Secondary malignant neoplasm of skin: Secondary | ICD-10-CM | POA: Diagnosis not present

## 2021-06-01 DIAGNOSIS — Z794 Long term (current) use of insulin: Secondary | ICD-10-CM

## 2021-06-01 DIAGNOSIS — C8510 Unspecified B-cell lymphoma, unspecified site: Secondary | ICD-10-CM | POA: Diagnosis not present

## 2021-06-01 LAB — CBC WITH DIFFERENTIAL/PLATELET
Basophils Absolute: 0.1 10*3/uL (ref 0.0–0.1)
Basophils Relative: 1.3 % (ref 0.0–3.0)
Eosinophils Absolute: 0.2 10*3/uL (ref 0.0–0.7)
Eosinophils Relative: 2.7 % (ref 0.0–5.0)
HCT: 30.7 % — ABNORMAL LOW (ref 39.0–52.0)
Hemoglobin: 10.3 g/dL — ABNORMAL LOW (ref 13.0–17.0)
Lymphocytes Relative: 21.9 % (ref 12.0–46.0)
Lymphs Abs: 1.3 10*3/uL (ref 0.7–4.0)
MCHC: 33.7 g/dL (ref 30.0–36.0)
MCV: 106 fl — ABNORMAL HIGH (ref 78.0–100.0)
Monocytes Absolute: 0.9 10*3/uL (ref 0.1–1.0)
Monocytes Relative: 15.2 % — ABNORMAL HIGH (ref 3.0–12.0)
Neutro Abs: 3.4 10*3/uL (ref 1.4–7.7)
Neutrophils Relative %: 58.9 % (ref 43.0–77.0)
Platelets: 66 10*3/uL — ABNORMAL LOW (ref 150.0–400.0)
RBC: 2.89 Mil/uL — ABNORMAL LOW (ref 4.22–5.81)
RDW: 21.9 % — ABNORMAL HIGH (ref 11.5–15.5)
WBC: 5.7 10*3/uL (ref 4.0–10.5)

## 2021-06-01 LAB — IBC + FERRITIN
Ferritin: 800.2 ng/mL — ABNORMAL HIGH (ref 22.0–322.0)
Iron: 72 ug/dL (ref 42–165)
Saturation Ratios: 28.1 % (ref 20.0–50.0)
TIBC: 256.2 ug/dL (ref 250.0–450.0)
Transferrin: 183 mg/dL — ABNORMAL LOW (ref 212.0–360.0)

## 2021-06-01 LAB — VITAMIN B12: Vitamin B-12: >1550 pg/mL — ABNORMAL HIGH (ref 211–911)

## 2021-06-01 LAB — HEMOGLOBIN A1C: Hgb A1c MFr Bld: 6.7 % — ABNORMAL HIGH (ref 4.6–6.5)

## 2021-06-01 LAB — FOLATE: Folate: >23.4 ng/mL (ref >5.9)

## 2021-06-01 LAB — TSH: TSH: 1.04 u[IU]/mL (ref 0.35–5.50)

## 2021-06-01 NOTE — Progress Notes (Addendum)
Subjective:  Patient ID: Rodney Lee, male    DOB: [birth year]  Age: 80 y.o. MRN: [id number]  CC: New Patient (Initial Visit) (Establish care)  This visit occurred during the SARS-CoV-2 public health emergency.  Safety protocols were in place, including screening questions prior to the visit, additional usage of staff PPE, and extensive cleaning of exam room while observing appropriate contact time as indicated for disinfecting solutions.    HPI Rodney Lee presents for establishing.  He has been diagnosed with terminal B-cell lymphoma.  His current treatment is radiation therapy.  He has had poorly controlled diabetes in the setting of prednisone.  He is no longer taking prednisone.  He denies polys.  He tells me and his wife that he would like to stop taking so many medications.  Outpatient Medications Prior to Visit  Medication Sig Dispense Refill   amiodarone (PACERONE) 200 MG tablet Take 1 tablet (200 mg total) by mouth daily. 7 tablet 0   blood glucose meter kit and supplies Dispense based on patient and insurance preference. Use up to four times daily as directed. (FOR ICD-10 E10.9, E11.9). 1 each 0   carvedilol (COREG) 6.25 MG tablet TAKE 1 TABLET BY MOUTH  TWICE DAILY WITH A MEAL 180 tablet 2   citalopram (CELEXA) 20 MG tablet Take 20 mg by mouth daily.     famotidine (PEPCID) 20 MG tablet Take 20 mg by mouth 2 (two) times daily.     insulin lispro (HUMALOG KWIKPEN) 100 UNIT/ML KwikPen Use following sliding scale provided, at meal times as long as eating > 50% of your meal, up to three times daily. 15 mL 0   Insulin Pen Needle 31G X 5 MM MISC Use with insulin up to three times daily with meals 100 each 0   isosorbide mononitrate (IMDUR) 60 MG 24 hr tablet Take 60 mg in the morning and 30 mg (half a tablet) in the evening. 135 tablet 3   levothyroxine (SYNTHROID) 125 MCG tablet Take 125 mcg by mouth daily.     Multiple Vitamin (MULTIVITAMIN) tablet Take 1 tablet by mouth daily.      nitroGLYCERIN (NITROSTAT) 0.4 MG SL tablet Place 1 tablet (0.4 mg total) under the tongue every 5 (five) minutes as needed for chest pain. 25 tablet 2   ONETOUCH ULTRA test strip USE 1 STRIP TO CHECK GLUCOSE AS DIRECTED     glimepiride (AMARYL) 1 MG tablet Take 1 mg by mouth daily with breakfast.     repaglinide (PRANDIN) 2 MG tablet Take 2 mg by mouth 2 (two) times daily before a meal.     rosuvastatin (CRESTOR) 40 MG tablet Take 40 mg by mouth daily.     vitamin C (ASCORBIC ACID) 500 MG tablet Take 500 mg by mouth daily.     allopurinol (ZYLOPRIM) 300 MG tablet Take 1 tablet (300 mg total) by mouth daily. (Patient not taking: Reported on 06/01/2021) 90 tablet 1   lidocaine-prilocaine (EMLA) cream APPLY  CREAM TOPICALLY AS NEEDED (Patient not taking: Reported on 06/01/2021) 30 g 0   ondansetron (ZOFRAN) 8 MG tablet Take 1 tablet (8 mg total) by mouth every 8 (eight) hours as needed for nausea or vomiting. (Patient not taking: No sig reported) 30 tablet 0   predniSONE (DELTASONE) 20 MG tablet Take 3 tablets (60 mg total) by mouth as directed. Take 60 mg  ( 3 tablets) by mouth every morning on Days 1 to 5 of chemotherapy. (Patient not taking: Reported  on 06/01/2021) 15 tablet 5   prochlorperazine (COMPAZINE) 10 MG tablet TAKE 1 TABLET BY MOUTH EVERY 6 HOURS AS NEEDED FOR NAUSEA OR  VOMITING (Patient not taking: No sig reported) 30 tablet 0   No facility-administered medications prior to visit.    ROS Review of Systems  Constitutional:  Positive for fatigue. Negative for diaphoresis and unexpected weight change.  HENT: Negative.    Eyes: Negative.   Respiratory:  Negative for cough and shortness of breath.   Cardiovascular:  Negative for chest pain, palpitations and leg swelling.  Gastrointestinal:  Negative for abdominal pain, diarrhea, nausea and vomiting.  Endocrine: Negative.   Genitourinary: Negative.  Negative for difficulty urinating and hematuria.  Musculoskeletal:  Negative for  arthralgias and myalgias.  Skin: Negative.   Neurological:  Positive for weakness. Negative for dizziness and light-headedness.  Hematological:  Negative for adenopathy. Does not bruise/bleed easily.  Psychiatric/Behavioral: Negative.     Objective:  BP (!) 118/50 (BP Location: Right Arm, Patient Position: Sitting, Cuff Size: Normal)   Pulse (!) 58   Temp 98.5 F (36.9 C) (Oral)   Ht 5' 10.5" (1.791 m)   Wt 186 lb (84.4 kg)   SpO2 99%   BMI 26.31 kg/m   BP Readings from Last 3 Encounters:  06/02/21 108/70  06/01/21 (!) 118/50  05/18/21 (!) 95/51    Wt Readings from Last 3 Encounters:  06/02/21 186 lb 9.6 oz (84.6 kg)  06/01/21 186 lb (84.4 kg)  05/18/21 187 lb (84.8 kg)    Physical Exam Constitutional:      Appearance: He is ill-appearing.  HENT:     Mouth/Throat:     Mouth: Mucous membranes are moist. Mucous membranes are pale.  Eyes:     Conjunctiva/sclera: Conjunctivae normal.  Cardiovascular:     Rate and Rhythm: Normal rate and regular rhythm.     Heart sounds: No murmur heard. Pulmonary:     Effort: Pulmonary effort is normal.     Breath sounds: No stridor. No wheezing, rhonchi or rales.  Abdominal:     General: Abdomen is protuberant.     Palpations: There is mass. There is no hepatomegaly or splenomegaly.     Tenderness: There is no abdominal tenderness.    Musculoskeletal:     Cervical back: Neck supple.  Neurological:     Mental Status: He is alert.    Lab Results  Component Value Date   WBC 6.2 06/02/2021   HGB 9.8 (L) 06/02/2021   HCT 28.9 (L) 06/02/2021   PLT 50 (L) 06/02/2021   GLUCOSE 288 (H) 06/02/2021   CHOL 242 (H) 06/20/2011   TRIG 288.0 (H) 06/20/2011   HDL 33.10 (L) 06/20/2011   LDLDIRECT 159.8 06/20/2011   ALT 18 06/02/2021   AST 25 06/02/2021   NA 138 06/02/2021   K 4.4 06/02/2021   CL 103 06/02/2021   CREATININE 2.17 (H) 06/02/2021   BUN 33 (H) 06/02/2021   CO2 26 06/02/2021   TSH 1.04 06/01/2021   INR 1.09 03/01/2017    HGBA1C 6.7 (H) 06/01/2021    No results found.  Assessment & Plan:   Rodney Lee was seen today for new patient (initial visit).  Diagnoses and all orders for this visit:  Diabetes mellitus due to underlying condition with stage 3b chronic kidney disease, with long-term current use of insulin (Port[facility])- His A1c is down to 6.7%.  He is no longer taking steroids.  I gave him permission to stop taking glycemic agents. -  Hemoglobin A1c; Future -     Hemoglobin A1c  Acquired hypothyroidism- His TSH is in the normal range.  He will continue the current dose of T4. -     TSH; Future -     TSH  Deficiency anemia- I will screen him for vitamin deficiencies. -     CBC with Differential/Platelet; Future -     Vitamin B12; Future -     IBC + Ferritin; Future -     Folate; Future -     Vitamin B1; Future -     Zinc; Future -     Zinc -     Vitamin B1 -     Folate -     IBC + Ferritin -     Vitamin B12 -     CBC with Differential/Platelet  Flu vaccine need -     Flu Vaccine QUAD High Dose(Fluad)  I have discontinued Monia Pouch. Bougher "Neal"'s vitamin C, rosuvastatin, glimepiride, ondansetron, predniSONE, prochlorperazine, repaglinide, and lidocaine-prilocaine. I am also having him maintain his multivitamin, carvedilol, nitroGLYCERIN, isosorbide mononitrate, citalopram, levothyroxine, allopurinol, OneTouch Ultra, blood glucose meter kit and supplies, Insulin Pen Needle, insulin lispro, amiodarone, and famotidine.  No orders of the defined types were placed in this encounter.    Follow-up: Return in about 3 months (around 08/31/2021).  Scarlette Calico, MD

## 2021-06-01 NOTE — Patient Instructions (Signed)
Anemia Anemia is a condition in which there is not enough red blood cells or hemoglobin in the blood. Hemoglobin is a substance in red blood cells that carries oxygen. When you do not have enough red blood cells or hemoglobin (are anemic), your body cannot get enough oxygen and your organs may not work properly. As a result, you may feel very tired or have other problems. What are the causes? Common causes of anemia include: Excessive bleeding. Anemia can be caused by excessive bleeding inside or outside the body, including bleeding from the intestines or from heavy menstrual periods in females. Poor nutrition. Long-lasting (chronic) kidney, thyroid, and liver disease. Bone marrow disorders, spleen problems, and blood disorders. Cancer and treatments for cancer. HIV (human immunodeficiency virus) and AIDS (acquired immunodeficiency syndrome). Infections, medicines, and autoimmune disorders that destroy red blood cells. What are the signs or symptoms? Symptoms of this condition include: Minor weakness. Dizziness. Headache, or difficulties concentrating and sleeping. Heartbeats that feel irregular or faster than normal (palpitations). Shortness of breath, especially with exercise. Pale skin, lips, and nails, or cold hands and feet. Indigestion and nausea. Symptoms may occur suddenly or develop slowly. If your anemia is mild, you may not have symptoms. How is this diagnosed? This condition is diagnosed based on blood tests, your medical history, and a physical exam. In some cases, a test may be needed in which cells are removed from the soft tissue inside of a bone and looked at under a microscope (bone marrow biopsy). Your health care provider may also check your stool (feces) for blood and may do additional testing to look for the cause of your bleeding. Other tests may include: Imaging tests, such as a CT scan or MRI. A procedure to see inside your esophagus and stomach (endoscopy). A  procedure to see inside your colon and rectum (colonoscopy). How is this treated? Treatment for this condition depends on the cause. If you continue to lose a lot of blood, you may need to be treated at a hospital. Treatment may include: Taking supplements of iron, vitamin B12, or folic acid. Taking a hormone medicine (erythropoietin) that can help to stimulate red blood cell growth. Having a blood transfusion. This may be needed if you lose a lot of blood. Making changes to your diet. Having surgery to remove your spleen. Follow these instructions at home: Take over-the-counter and prescription medicines only as told by your health care provider. Take supplements only as told by your health care provider. Follow any diet instructions that you were given by your health care provider. Keep all follow-up visits as told by your health care provider. This is important. Contact a health care provider if: You develop new bleeding anywhere in the body. Get help right away if: You are very weak. You are short of breath. You have pain in your abdomen or chest. You are dizzy or feel faint. You have trouble concentrating. You have bloody stools, black stools, or tarry stools. You vomit repeatedly or you vomit up blood. These symptoms may represent a serious problem that is an emergency. Do not wait to see if the symptoms will go away. Get medical help right away. Call your local emergency services (911 in the [place] Do not drive yourself to the hospital. Summary Anemia is a condition in which you do not have enough red blood cells or enough of a substance in your red blood cells that carries oxygen (hemoglobin). Symptoms may occur suddenly or develop slowly. If your anemia is   mild, you may not have symptoms. This condition is diagnosed with blood tests, a medical history, and a physical exam. Other tests may be needed. Treatment for this condition depends on the cause of the anemia. This  information is not intended to replace advice given to you by your health care provider. Make sure you discuss any questions you have with your health care provider. Document Revised: 07/30/2019 Document Reviewed: 07/30/2019 [employer] Patient Education  2022 [employer]  

## 2021-06-02 ENCOUNTER — Inpatient Hospital Stay (HOSPITAL_BASED_OUTPATIENT_CLINIC_OR_DEPARTMENT_OTHER): Payer: Medicare HMO | Admitting: Hematology and Oncology

## 2021-06-02 ENCOUNTER — Other Ambulatory Visit: Payer: Self-pay

## 2021-06-02 ENCOUNTER — Inpatient Hospital Stay: Payer: Medicare HMO | Attending: Hematology and Oncology

## 2021-06-02 ENCOUNTER — Ambulatory Visit
Admission: RE | Admit: 2021-06-02 | Discharge: 2021-06-02 | Disposition: A | Discharge status: Home / Self Care | Payer: Medicare HMO | Source: Ambulatory Visit | Attending: Radiation Oncology | Admitting: Radiation Oncology

## 2021-06-02 VITALS — BP 108/70 | HR 81 | Temp 98.4°F | Resp 18 | Ht 70.5 in | Wt 186.6 lb

## 2021-06-02 DIAGNOSIS — C8513 Unspecified B-cell lymphoma, intra-abdominal lymph nodes: Secondary | ICD-10-CM | POA: Insufficient documentation

## 2021-06-02 DIAGNOSIS — C8510 Unspecified B-cell lymphoma, unspecified site: Secondary | ICD-10-CM | POA: Diagnosis not present

## 2021-06-02 DIAGNOSIS — E1122 Type 2 diabetes mellitus with diabetic chronic kidney disease: Secondary | ICD-10-CM | POA: Diagnosis not present

## 2021-06-02 DIAGNOSIS — C792 Secondary malignant neoplasm of skin: Secondary | ICD-10-CM | POA: Diagnosis not present

## 2021-06-02 DIAGNOSIS — I130 Hypertensive heart and chronic kidney disease with heart failure and stage 1 through stage 4 chronic kidney disease, or unspecified chronic kidney disease: Secondary | ICD-10-CM | POA: Diagnosis not present

## 2021-06-02 DIAGNOSIS — I5022 Chronic systolic (congestive) heart failure: Secondary | ICD-10-CM | POA: Diagnosis not present

## 2021-06-02 DIAGNOSIS — C8519 Unspecified B-cell lymphoma, extranodal and solid organ sites: Secondary | ICD-10-CM | POA: Diagnosis not present

## 2021-06-02 DIAGNOSIS — T451X5A Adverse effect of antineoplastic and immunosuppressive drugs, initial encounter: Secondary | ICD-10-CM | POA: Insufficient documentation

## 2021-06-02 DIAGNOSIS — D6481 Anemia due to antineoplastic chemotherapy: Secondary | ICD-10-CM | POA: Insufficient documentation

## 2021-06-02 DIAGNOSIS — Z510 Encounter for antineoplastic radiation therapy: Secondary | ICD-10-CM | POA: Diagnosis not present

## 2021-06-02 DIAGNOSIS — N184 Chronic kidney disease, stage 4 (severe): Secondary | ICD-10-CM | POA: Diagnosis not present

## 2021-06-02 DIAGNOSIS — I25118 Atherosclerotic heart disease of native coronary artery with other forms of angina pectoris: Secondary | ICD-10-CM | POA: Diagnosis not present

## 2021-06-02 DIAGNOSIS — Z9181 History of falling: Secondary | ICD-10-CM | POA: Insufficient documentation

## 2021-06-02 DIAGNOSIS — C8336 Diffuse large B-cell lymphoma, intrapelvic lymph nodes: Secondary | ICD-10-CM | POA: Diagnosis not present

## 2021-06-02 DIAGNOSIS — D6959 Other secondary thrombocytopenia: Secondary | ICD-10-CM | POA: Diagnosis not present

## 2021-06-02 DIAGNOSIS — R630 Anorexia: Secondary | ICD-10-CM | POA: Diagnosis not present

## 2021-06-02 DIAGNOSIS — Z95828 Presence of other vascular implants and grafts: Secondary | ICD-10-CM

## 2021-06-02 DIAGNOSIS — D696 Thrombocytopenia, unspecified: Secondary | ICD-10-CM

## 2021-06-02 DIAGNOSIS — I255 Ischemic cardiomyopathy: Secondary | ICD-10-CM | POA: Diagnosis not present

## 2021-06-02 DIAGNOSIS — C8511 Unspecified B-cell lymphoma, lymph nodes of head, face, and neck: Secondary | ICD-10-CM | POA: Diagnosis not present

## 2021-06-02 DIAGNOSIS — E039 Hypothyroidism, unspecified: Secondary | ICD-10-CM | POA: Diagnosis not present

## 2021-06-02 LAB — CBC WITH DIFFERENTIAL (CANCER CENTER ONLY)
Abs Immature Granulocytes: 0.25 10*3/uL — ABNORMAL HIGH (ref 0.00–0.07)
Basophils Absolute: 0 10*3/uL (ref 0.0–0.1)
Basophils Relative: 1 %
Eosinophils Absolute: 0.2 10*3/uL (ref 0.0–0.5)
Eosinophils Relative: 3 %
HCT: 28.9 % — ABNORMAL LOW (ref 39.0–52.0)
Hemoglobin: 9.8 g/dL — ABNORMAL LOW (ref 13.0–17.0)
Immature Granulocytes: 4 %
Lymphocytes Relative: 15 %
Lymphs Abs: 1 10*3/uL (ref 0.7–4.0)
MCH: 36 pg — ABNORMAL HIGH (ref 26.0–34.0)
MCHC: 33.9 g/dL (ref 30.0–36.0)
MCV: 106.3 fL — ABNORMAL HIGH (ref 80.0–100.0)
Monocytes Absolute: 0.8 10*3/uL (ref 0.1–1.0)
Monocytes Relative: 13 %
Neutro Abs: 4 10*3/uL (ref 1.7–7.7)
Neutrophils Relative %: 64 %
Platelet Count: 50 10*3/uL — ABNORMAL LOW (ref 150–400)
RBC: 2.72 MIL/uL — ABNORMAL LOW (ref 4.22–5.81)
RDW: 20.2 % — ABNORMAL HIGH (ref 11.5–15.5)
WBC Count: 6.2 10*3/uL (ref 4.0–10.5)
nRBC: 0 % (ref 0.0–0.2)

## 2021-06-02 LAB — CMP (CANCER CENTER ONLY)
ALT: 18 U/L (ref 0–44)
AST: 25 U/L (ref 15–41)
Albumin: 3.4 g/dL — ABNORMAL LOW (ref 3.5–5.0)
Alkaline Phosphatase: 82 U/L (ref 38–126)
Anion gap: 9 (ref 5–15)
BUN: 33 mg/dL — ABNORMAL HIGH (ref 8–23)
CO2: 26 mmol/L (ref 22–32)
Calcium: 9.2 mg/dL (ref 8.9–10.3)
Chloride: 103 mmol/L (ref 98–111)
Creatinine: 2.17 mg/dL — ABNORMAL HIGH (ref 0.61–1.24)
GFR, Estimated: 30 mL/min — ABNORMAL LOW (ref >60)
Glucose, Bld: 288 mg/dL — ABNORMAL HIGH (ref 70–99)
Potassium: 4.4 mmol/L (ref 3.5–5.1)
Sodium: 138 mmol/L (ref 135–145)
Total Bilirubin: 0.4 mg/dL (ref 0.3–1.2)
Total Protein: 6.1 g/dL — ABNORMAL LOW (ref 6.5–8.1)

## 2021-06-02 LAB — URIC ACID: Uric Acid, Serum: 3.9 mg/dL (ref 3.7–8.6)

## 2021-06-02 LAB — LACTATE DEHYDROGENASE: LDH: 183 U/L (ref 98–192)

## 2021-06-03 ENCOUNTER — Ambulatory Visit (INDEPENDENT_AMBULATORY_CARE_PROVIDER_SITE_OTHER): Payer: Medicare HMO

## 2021-06-03 ENCOUNTER — Ambulatory Visit
Admission: RE | Admit: 2021-06-03 | Discharge: 2021-06-03 | Disposition: A | Discharge status: Home / Self Care | Payer: Medicare HMO | Source: Ambulatory Visit | Attending: Radiation Oncology | Admitting: Radiation Oncology

## 2021-06-03 DIAGNOSIS — E1122 Type 2 diabetes mellitus with diabetic chronic kidney disease: Secondary | ICD-10-CM | POA: Diagnosis not present

## 2021-06-03 DIAGNOSIS — C8510 Unspecified B-cell lymphoma, unspecified site: Secondary | ICD-10-CM | POA: Diagnosis not present

## 2021-06-03 DIAGNOSIS — I5022 Chronic systolic (congestive) heart failure: Secondary | ICD-10-CM | POA: Diagnosis not present

## 2021-06-03 DIAGNOSIS — C792 Secondary malignant neoplasm of skin: Secondary | ICD-10-CM | POA: Diagnosis not present

## 2021-06-03 DIAGNOSIS — C8511 Unspecified B-cell lymphoma, lymph nodes of head, face, and neck: Secondary | ICD-10-CM | POA: Diagnosis not present

## 2021-06-03 DIAGNOSIS — C8336 Diffuse large B-cell lymphoma, intrapelvic lymph nodes: Secondary | ICD-10-CM | POA: Diagnosis not present

## 2021-06-03 DIAGNOSIS — Z510 Encounter for antineoplastic radiation therapy: Secondary | ICD-10-CM | POA: Diagnosis not present

## 2021-06-03 DIAGNOSIS — R630 Anorexia: Secondary | ICD-10-CM | POA: Diagnosis not present

## 2021-06-03 DIAGNOSIS — I130 Hypertensive heart and chronic kidney disease with heart failure and stage 1 through stage 4 chronic kidney disease, or unspecified chronic kidney disease: Secondary | ICD-10-CM | POA: Diagnosis not present

## 2021-06-03 DIAGNOSIS — N184 Chronic kidney disease, stage 4 (severe): Secondary | ICD-10-CM | POA: Diagnosis not present

## 2021-06-03 DIAGNOSIS — I25118 Atherosclerotic heart disease of native coronary artery with other forms of angina pectoris: Secondary | ICD-10-CM | POA: Diagnosis not present

## 2021-06-03 DIAGNOSIS — E039 Hypothyroidism, unspecified: Secondary | ICD-10-CM | POA: Diagnosis not present

## 2021-06-03 DIAGNOSIS — C8519 Unspecified B-cell lymphoma, extranodal and solid organ sites: Secondary | ICD-10-CM | POA: Diagnosis not present

## 2021-06-03 DIAGNOSIS — I255 Ischemic cardiomyopathy: Secondary | ICD-10-CM

## 2021-06-03 LAB — CUP PACEART REMOTE DEVICE CHECK
Battery Remaining Longevity: 32 mo
Battery Voltage: 2.94 V
Brady Statistic AP VP Percent: 0.06 %
Brady Statistic AP VS Percent: 94.43 %
Brady Statistic AS VP Percent: 0 %
Brady Statistic AS VS Percent: 5.51 %
Brady Statistic RA Percent Paced: 94.29 %
Brady Statistic RV Percent Paced: 0.06 %
Date Time Interrogation Session: 20220929012405
HighPow Impedance: 38 Ohm
HighPow Impedance: 48 Ohm
Implantable Lead Implant Date: 20090908
Implantable Lead Implant Date: 20090908
Implantable Lead Location: 753859
Implantable Lead Location: 753860
Implantable Lead Model: 5076
Implantable Lead Model: 6947
Implantable Pulse Generator Implant Date: 20170123
Lead Channel Impedance Value: 399 Ohm
Lead Channel Impedance Value: 532 Ohm
Lead Channel Impedance Value: 627 Ohm
Lead Channel Pacing Threshold Amplitude: 0.625 V
Lead Channel Pacing Threshold Amplitude: 0.75 V
Lead Channel Pacing Threshold Pulse Width: 0.4 ms
Lead Channel Pacing Threshold Pulse Width: 0.4 ms
Lead Channel Sensing Intrinsic Amplitude: 1.5 mV
Lead Channel Sensing Intrinsic Amplitude: 1.5 mV
Lead Channel Sensing Intrinsic Amplitude: 18.875 mV
Lead Channel Sensing Intrinsic Amplitude: 18.875 mV
Lead Channel Setting Pacing Amplitude: 2 V
Lead Channel Setting Pacing Amplitude: 2.5 V
Lead Channel Setting Pacing Pulse Width: 0.4 ms
Lead Channel Setting Sensing Sensitivity: 0.3 mV

## 2021-06-04 ENCOUNTER — Ambulatory Visit
Admission: RE | Admit: 2021-06-04 | Discharge: 2021-06-04 | Disposition: A | Discharge status: Home / Self Care | Payer: Medicare HMO | Source: Ambulatory Visit | Attending: Radiation Oncology | Admitting: Radiation Oncology

## 2021-06-04 ENCOUNTER — Other Ambulatory Visit: Payer: Self-pay

## 2021-06-04 DIAGNOSIS — C8519 Unspecified B-cell lymphoma, extranodal and solid organ sites: Secondary | ICD-10-CM | POA: Diagnosis not present

## 2021-06-04 DIAGNOSIS — Z510 Encounter for antineoplastic radiation therapy: Secondary | ICD-10-CM | POA: Diagnosis not present

## 2021-06-04 DIAGNOSIS — N184 Chronic kidney disease, stage 4 (severe): Secondary | ICD-10-CM | POA: Diagnosis not present

## 2021-06-04 DIAGNOSIS — C8336 Diffuse large B-cell lymphoma, intrapelvic lymph nodes: Secondary | ICD-10-CM | POA: Diagnosis not present

## 2021-06-04 DIAGNOSIS — I130 Hypertensive heart and chronic kidney disease with heart failure and stage 1 through stage 4 chronic kidney disease, or unspecified chronic kidney disease: Secondary | ICD-10-CM | POA: Diagnosis not present

## 2021-06-04 DIAGNOSIS — C8510 Unspecified B-cell lymphoma, unspecified site: Secondary | ICD-10-CM | POA: Diagnosis not present

## 2021-06-04 DIAGNOSIS — I25118 Atherosclerotic heart disease of native coronary artery with other forms of angina pectoris: Secondary | ICD-10-CM | POA: Diagnosis not present

## 2021-06-04 DIAGNOSIS — E1122 Type 2 diabetes mellitus with diabetic chronic kidney disease: Secondary | ICD-10-CM | POA: Diagnosis not present

## 2021-06-04 DIAGNOSIS — C8511 Unspecified B-cell lymphoma, lymph nodes of head, face, and neck: Secondary | ICD-10-CM | POA: Diagnosis not present

## 2021-06-04 DIAGNOSIS — C792 Secondary malignant neoplasm of skin: Secondary | ICD-10-CM | POA: Diagnosis not present

## 2021-06-04 DIAGNOSIS — E039 Hypothyroidism, unspecified: Secondary | ICD-10-CM | POA: Diagnosis not present

## 2021-06-04 DIAGNOSIS — R630 Anorexia: Secondary | ICD-10-CM | POA: Diagnosis not present

## 2021-06-04 DIAGNOSIS — I5022 Chronic systolic (congestive) heart failure: Secondary | ICD-10-CM | POA: Diagnosis not present

## 2021-06-05 LAB — ZINC: Zinc: 50 ug/dL — ABNORMAL LOW (ref 60–130)

## 2021-06-05 LAB — VITAMIN B1: Vitamin B1 (Thiamine): 57 nmol/L — ABNORMAL HIGH (ref 8–30)

## 2021-06-06 ENCOUNTER — Encounter: Payer: Self-pay | Admitting: Hematology and Oncology

## 2021-06-06 NOTE — Progress Notes (Signed)
[place] [phone]   [phone]  PROGRESS NOTE  Patient Care Team: Janith Lima, MD as PCP - General (Internal Medicine) Evans Lance, MD as PCP - Electrophysiology (Cardiology) [name], MD as PCP - Cardiology (Cardiology)  Hematological/Oncological History # High Grade B Cell Lymphoma, Stage III/IV 12/10/2020: CT Abdomen/Pelvis shows a soft tissue mass at 6.7 cm with soft tissue stranding.  12/14/2020: punch biopsy shows findings consistent with a high grade B cell lymphoma.  01/07/2021: establish care with Dr. Lorenso Courier  01/21/2021: PET CT scan shows diffuse disease with anterior abdominal wall lesion, pulmonary nodule, lesions along the cortex of the left/right kidney.  02/04/2021: Cycle 1 Day 1 of R-GCVP 02/25/2021: Cycle 2 Day 1 of R-GCVP. Held Cycle 2 Day 8 gemcitabine due to marked hyperglycemia. Patient admitted.  03/17/2021: Cycle 3 Day 1 of R-GCVP 04/07/2021: Cycle 4 Day 1 of R-GCVP 04/28/2021: Cycle 5 delayed due to cytopenias 05/05/2021: Chemotherapy held due to failure to respond to therapy noted on PET CT scan. Poor recovery of blood counts and continued deconditioning.   Interval History:  Rodney Lee 80 y.o. male with medical history significant for advanced stage high grade B cell lymphoma who presents for a follow up visit. The patient's last visit was on 05/05/2021. In the interim since the last visit he has started palliative radiation to the lymphoma mass in his abdomen.  On exam today Rodney Lee is accompanied by his wife.  He notes that there was some mild bleeding of his mass earlier today when a scab fell off.  Fortunately he is not having any fevers, chills, sweats, nausea, vomiting or diarrhea.  He is energy levels appear to be at baseline.  He is having no side effects as result of the radiation therapy.  Overall his health is stable from prior.  We again addressed goals of care and he noted that he was content to continue  with radiation therapy alone.  Once the tumor progresses I would recommend that we proceed with comfort based measures alone.  He was in agreement with this plan.  His wife notes he also discussed this with the family and they are agreeable to this plan moving forward.  A full 10 point ROS is listed below.  MEDICAL HISTORY:  Past Medical History:  Diagnosis Date   Atrial fibrillation ([place])    CHADS2Vasc is at least 5, on Xarelto   CAD (coronary artery disease)    Cancer ([place])    b-cell lymphoma   cardiomyopathy    MDT ICD, Dr. Lovena Le 2009   CHF (congestive heart failure) ([place])    Diabetes mellitus    Exogenous obesity    Hyperlipidemia    Hypertension    Hypothyroidism    MI, old 26   ANTEROLATERAL, PTCA LAD per pt   Sinus bradycardia     SURGICAL HISTORY: Past Surgical History:  Procedure Laterality Date   ANGIOPLASTY     CARDIAC CATHETERIZATION  03/13/1994   ACUTE MI WITH TOTAL OCCLUSION OF MID LAD. REPERFUSION AFTER CROSSING WITH A GUIDEWIRE. POOR RIGHT TO LEFT COLLATERAL FLOW. Pt states had PTCA   CARDIAC DEFIBRILLATOR PLACEMENT     CARDIOVASCULAR STRESS TEST  03/11/2008   EF 30%. NO REVERSIBLE ISCHEMIA   EP IMPLANTABLE DEVICE N/A 09/28/2015   Procedure:  ICD Generator Changeout;  Surgeon: Evans Lance, MD;  Location: [facility];  Service: Cardiovascular;  Laterality: N/A;   IR IMAGING GUIDED PORT INSERTION  01/28/2021   US ECHOCARDIOGRAPHY  04/26/2010   EF 35-40%. MODERATE LVH WITH MODERATE GLOBAL LV SYSTOLIC DYSFUNCTION AND IMPAIRED RELAXATION. MILD AORTIC SCLEROSIS WITHOUT STENOSIS. NORMAL PULMONARY ARTERY PRESSURE.   US ECHOCARDIOGRAPHY  10/07/2004   EF 30-35%    SOCIAL HISTORY: Social History   Socioeconomic History   Marital status: Married    Spouse name: Not on file   Number of children: Not on file   Years of education: Not on file   Highest education level: Not on file  Occupational History   Occupation: Retired  Tobacco Use   Smoking status:  Never   Smokeless tobacco: Never  Vaping Use   Vaping Use: Never used  Substance and Sexual Activity   Alcohol use: No   Drug use: No   Sexual activity: Not on file  Other Topics Concern   Not on file  Social History Narrative   Lives with wife   Social Determinants of Health   Financial Resource Strain: Not on file  Food Insecurity: Not on file  Transportation Needs: Not on file  Physical Activity: Not on file  Stress: Not on file  Social Connections: Not on file  Intimate Partner Violence: Not on file    FAMILY HISTORY: Family History  Problem Relation Age of Onset   Heart disease Mother    Heart attack Mother     ALLERGIES:  is allergic to canagliflozin and lipitor [atorvastatin calcium].  MEDICATIONS:  Current Outpatient Medications  Medication Sig Dispense Refill   allopurinol (ZYLOPRIM) 300 MG tablet Take 1 tablet (300 mg total) by mouth daily. (Patient not taking: Reported on 06/01/2021) 90 tablet 1   amiodarone (PACERONE) 200 MG tablet Take 1 tablet (200 mg total) by mouth daily. 7 tablet 0   blood glucose meter kit and supplies Dispense based on patient and insurance preference. Use up to four times daily as directed. (FOR ICD-10 E10.9, E11.9). 1 each 0   carvedilol (COREG) 6.25 MG tablet TAKE 1 TABLET BY MOUTH  TWICE DAILY WITH A MEAL 180 tablet 2   citalopram (CELEXA) 20 MG tablet Take 20 mg by mouth daily.     famotidine (PEPCID) 20 MG tablet Take 20 mg by mouth 2 (two) times daily.     insulin lispro (HUMALOG KWIKPEN) 100 UNIT/ML KwikPen Use following sliding scale provided, at meal times as long as eating > 50% of your meal, up to three times daily. 15 mL 0   Insulin Pen Needle 31G X 5 MM MISC Use with insulin up to three times daily with meals 100 each 0   isosorbide mononitrate (IMDUR) 60 MG 24 hr tablet Take 60 mg in the morning and 30 mg (half a tablet) in the evening. 135 tablet 3   levothyroxine (SYNTHROID) 125 MCG tablet Take 125 mcg by mouth daily.      Multiple Vitamin (MULTIVITAMIN) tablet Take 1 tablet by mouth daily.     nitroGLYCERIN (NITROSTAT) 0.4 MG SL tablet Place 1 tablet (0.4 mg total) under the tongue every 5 (five) minutes as needed for chest pain. 25 tablet 2   ONETOUCH ULTRA test strip USE 1 STRIP TO CHECK GLUCOSE AS DIRECTED     No current facility-administered medications for this visit.    REVIEW OF SYSTEMS:   Constitutional: ( - ) fevers, ( - )  chills , ( - ) night sweats Eyes: ( - ) blurriness of vision, ( - ) double vision, ( - ) watery eyes Ears, nose, mouth, throat,  and face: ( - ) mucositis, ( - ) sore throat Respiratory: ( - ) cough, ( - ) dyspnea, ( - ) wheezes Cardiovascular: ( - ) palpitation, ( - ) chest discomfort, ( - ) lower extremity swelling Gastrointestinal:  ( - ) nausea, ( - ) heartburn, ( - ) change in bowel habits Skin: ( - ) abnormal skin rashes Lymphatics: ( - ) new lymphadenopathy, ( - ) easy bruising Neurological: ( - ) numbness, ( - ) tingling, ( - ) new weaknesses Behavioral/Psych: ( - ) mood change, ( - ) new changes  All other systems were reviewed with the patient and are negative.  PHYSICAL EXAMINATION: ECOG PERFORMANCE STATUS: 2 - Symptomatic, <50% confined to bed  Vitals:   06/02/21 1205  BP: 108/70  Pulse: 81  Resp: 18  Temp: 98.4 F (36.9 C)  SpO2: 97%    Filed Weights   06/02/21 1205  Weight: 186 lb 9.6 oz (84.6 kg)     GENERAL: well appearing elderly Caucasian male. alert, no distress and comfortable SKIN: prior lesion on back on head resolved. Otherwise skin color, texture, turgor are normal, no rashes or significant lesions EYES: conjunctiva are pink and non-injected, sclera clear LUNGS: clear to auscultation and percussion with normal breathing effort HEART: regular rate & rhythm and no murmurs and no lower extremity edema ABDOMEN: Smaller left-sided lesion with some necrosis but no drainage. No overt signs of infection. Appears stable.  Consistent with  lymphoma noted on PET CT scan. Musculoskeletal: no cyanosis of digits and no clubbing  PSYCH: alert & oriented x 3, fluent speech NEURO: no focal motor/sensory deficits  LABORATORY DATA:  I have reviewed the data as listed CBC Latest Ref Rng & Units 06/02/2021 06/01/2021 05/05/2021  WBC 4.0 - 10.5 K/uL 6.2 5.7 6.9  Hemoglobin 13.0 - 17.0 g/dL 9.8(L) 10.3(L) 8.6(L)  Hematocrit 39.0 - 52.0 % 28.9(L) 30.7(L) 26.9(L)  Platelets 150 - 400 K/uL 50(L) 66.0(L) 65(L)    CMP Latest Ref Rng & Units 06/02/2021 05/05/2021 04/28/2021  Glucose 70 - 99 mg/dL 288(H) 267(H) 258(H)  BUN 8 - 23 mg/dL 33(H) 35(H) 26(H)  Creatinine 0.61 - 1.24 mg/dL 2.17(H) 2.03(H) 2.15(H)  Sodium 135 - 145 mmol/L 138 139 138  Potassium 3.5 - 5.1 mmol/L 4.4 4.5 4.5  Chloride 98 - 111 mmol/L 103 107 107  CO2 22 - 32 mmol/L 26 25 24   Calcium 8.9 - 10.3 mg/dL 9.2 8.5(L) 8.2(L)  Total Protein 6.5 - 8.1 g/dL 6.1(L) 5.4(L) 5.0(L)  Total Bilirubin 0.3 - 1.2 mg/dL 0.4 0.4 0.3  Alkaline Phos 38 - 126 U/L 82 86 103  AST 15 - 41 U/L 25 28 28   ALT 0 - 44 U/L 18 22 20     RADIOGRAPHIC STUDIES: I have personally reviewed the radiological images as listed and agreed with the findings in the report: involvement of the left abdomen, the kidney's bilaterally, and pulmonary nodule.   CUP PACEART REMOTE DEVICE CHECK  Result Date: 06/03/2021 Scheduled remote reviewed. Normal device function.  Optivol is back to normal limits but was elevated with a decrease in activity noted Next remote 91 days. Kathy Breach, RN, CCDS, [employer]    ASSESSMENT & PLAN Rodney Lee 80 y.o. male with medical history significant for advanced stage high grade B cell lymphoma who presents for a follow up visit.   Given Rodney Lee complicated medical history with cardiac dysfunction, renal dysfunction, and advanced age I am concerned that he would  not be able to tolerate anthracycline-based therapy such as R mini CHOP.  As such the best regimen to  proceed with given his comorbidities would be R-GCVP which is not toxic to the heart or filter to the kidney and can be used in patients with advanced age.  At this time we recommend proceeding with R-GCVP chemotherapy given his advanced age, cardiac dysfunction, and poor renal function.  Previously discussed the risks and benefits of treatment including nausea, vomiting, diarrhea, neuropathy, and allergic reactions.  Patient has an IPI showing he has high risk with lower rates of success with these treatment regimens.  Patient voices understanding of the risks and benefits of treatment was willing to proceed.  We will plan for 21 day cycles x 6 cycles. Rituximab 340m/m2 IV on Day 1, Cyclophosphamide 750 mg /m2 on Day 1, Vincristine 1.464mm2 (capped at 81m43mon Day 1, and Prednisone 72m49my 1-5 (protocol calls for prednisolone, but will adjust to lower dose prednisone). The Gemcitabine is to be administered on Day 1-8 and will be 750 mg/m2 IV over 30 minutes once per day on days 1 & 8 for Cycle 1, 875 mg/m2 IV over 30 minutes once per day on days 1 & 8 for Cycle 2, and 1000 mg/m2 IV over 30 minutes once per day on days 1 & 8 for Cycle 3 and onward.   Unfortunately on 05/05/2021 the patient was found to have continued low counts.  PET/CT on 05/03/2021 shows persistent FDG activity consistent with a Deauville 5 in the superficial abdominal mass.  Given these findings I would recommend we pursue comfort based care only.  An additional offering could be radiation therapy to the mass itself for palliation.  Patient noted he would like to discuss this with his wife and family.  I did have the opportunity speak with his son Rodney Kindredarding our findings and the plan moving forward.  They are open to considering hospice based care and have heard from radiation oncology regarding their offerings.  # High Grade B Cell Lymphoma, Stage III/IV --initial PET CT scan findings consistent with at least Stage III disease, with  organ involvement most likely a Stage IV --avoiding anthracycline therapy and alternative regimens given his poor cardiac and renal function -- 02/04/2021 was Cycle 1 Day 1 of R-GCVP --Cycle 2 Day 8 was held due to hyperglycemia. Cycle  4 Day 8 held due to cytopenias.  --today was to be Cycle 5 Day 1 of R-GCVP, but we will hold given his persistently poor counts and poor PET CT findings.  Plan: --STOPped chemotherapy in setting of poor blood counts and progression on disease on exam. Continued FDG avidity noted on PET, consistent with treatment failure --referral to Rad/Onc for consideration of palliative radiation to his superficial abdominal mass --began GOC Burdettversations with the family. They are open to a comfort based approach given his difficultly to with chemotherapy treatment --RTC in 4 weeks to reassess.   # Skin Tear, improved # Fall from Standing -- The patient has all he needs to keep himself from falling including a bedside commode, walker, and family support.  His falls occur when he attempts to walk to the restroom by himself without assistance.  Encourage him to ask for help. --no further bleeding, well healing at this time.  --No signs of infection --Continue to monitor.  #Chemotherapy Induced Anemia #Chemotherapy Induced Thrombocytopenia --Hgb 9.8 today --Plt 50 today --continue to monitor with transfusion support as needed.   #Supportive Care --  chemotherapy education complete -- port placed -- zofran 77m q8H PRN and compazine 131mPO q6H for nausea -- allopurinol 30069mO daily for TLS prophylaxis -- EMLA cream for port -- no pain medication required at this time.   No orders of the defined types were placed in this encounter.  All questions were answered. The patient knows to call the clinic with any problems, questions or concerns.  A total of more than 30 minutes were spent on this encounter with face-to-face time and non-face-to-face time, including preparing  to see the patient, ordering tests and/or medications, counseling the patient and coordination of care as outlined above.   JohLedell PeoplesD Department of Hematology/Oncology [place] WesAbra[facility] Campusone: [phone]: [phone]: [email or web address]  06/06/2021 5:04 PM

## 2021-06-07 ENCOUNTER — Other Ambulatory Visit: Payer: Self-pay

## 2021-06-07 ENCOUNTER — Other Ambulatory Visit: Payer: Self-pay | Admitting: Internal Medicine

## 2021-06-07 ENCOUNTER — Ambulatory Visit: Admission: RE | Admit: 2021-06-07 | Payer: Medicare HMO | Source: Ambulatory Visit

## 2021-06-07 ENCOUNTER — Ambulatory Visit
Admission: RE | Admit: 2021-06-07 | Discharge: 2021-06-07 | Disposition: A | Discharge status: Home / Self Care | Payer: Medicare HMO | Source: Ambulatory Visit | Attending: Radiation Oncology | Admitting: Radiation Oncology

## 2021-06-07 DIAGNOSIS — C792 Secondary malignant neoplasm of skin: Secondary | ICD-10-CM | POA: Diagnosis not present

## 2021-06-07 DIAGNOSIS — C8511 Unspecified B-cell lymphoma, lymph nodes of head, face, and neck: Secondary | ICD-10-CM | POA: Diagnosis not present

## 2021-06-07 DIAGNOSIS — C8336 Diffuse large B-cell lymphoma, intrapelvic lymph nodes: Secondary | ICD-10-CM | POA: Diagnosis not present

## 2021-06-07 DIAGNOSIS — D538 Other specified nutritional anemias: Secondary | ICD-10-CM

## 2021-06-07 DIAGNOSIS — I130 Hypertensive heart and chronic kidney disease with heart failure and stage 1 through stage 4 chronic kidney disease, or unspecified chronic kidney disease: Secondary | ICD-10-CM | POA: Diagnosis not present

## 2021-06-07 DIAGNOSIS — E1122 Type 2 diabetes mellitus with diabetic chronic kidney disease: Secondary | ICD-10-CM | POA: Diagnosis not present

## 2021-06-07 DIAGNOSIS — I25118 Atherosclerotic heart disease of native coronary artery with other forms of angina pectoris: Secondary | ICD-10-CM | POA: Diagnosis not present

## 2021-06-07 DIAGNOSIS — C8510 Unspecified B-cell lymphoma, unspecified site: Secondary | ICD-10-CM | POA: Insufficient documentation

## 2021-06-07 DIAGNOSIS — I5022 Chronic systolic (congestive) heart failure: Secondary | ICD-10-CM | POA: Diagnosis not present

## 2021-06-07 DIAGNOSIS — Z510 Encounter for antineoplastic radiation therapy: Secondary | ICD-10-CM | POA: Diagnosis not present

## 2021-06-07 DIAGNOSIS — E60 Dietary zinc deficiency: Secondary | ICD-10-CM | POA: Insufficient documentation

## 2021-06-07 DIAGNOSIS — E039 Hypothyroidism, unspecified: Secondary | ICD-10-CM | POA: Diagnosis not present

## 2021-06-07 DIAGNOSIS — N184 Chronic kidney disease, stage 4 (severe): Secondary | ICD-10-CM | POA: Diagnosis not present

## 2021-06-07 DIAGNOSIS — C8519 Unspecified B-cell lymphoma, extranodal and solid organ sites: Secondary | ICD-10-CM | POA: Diagnosis not present

## 2021-06-07 DIAGNOSIS — R630 Anorexia: Secondary | ICD-10-CM | POA: Diagnosis not present

## 2021-06-07 MED ORDER — ZINC GLUCONATE 50 MG PO TABS: 50.0000 mg | ORAL_TABLET | Freq: Every day | ORAL | 1 refills | Status: AC

## 2021-06-08 ENCOUNTER — Encounter: Payer: Self-pay | Admitting: Radiation Oncology

## 2021-06-08 ENCOUNTER — Ambulatory Visit
Admission: RE | Admit: 2021-06-08 | Discharge: 2021-06-08 | Disposition: A | Discharge status: Home / Self Care | Payer: Medicare HMO | Source: Ambulatory Visit | Attending: Radiation Oncology | Admitting: Radiation Oncology

## 2021-06-08 DIAGNOSIS — Z510 Encounter for antineoplastic radiation therapy: Secondary | ICD-10-CM | POA: Diagnosis not present

## 2021-06-08 DIAGNOSIS — C8510 Unspecified B-cell lymphoma, unspecified site: Secondary | ICD-10-CM | POA: Diagnosis not present

## 2021-06-08 DIAGNOSIS — C792 Secondary malignant neoplasm of skin: Secondary | ICD-10-CM | POA: Diagnosis not present

## 2021-06-08 DIAGNOSIS — C8336 Diffuse large B-cell lymphoma, intrapelvic lymph nodes: Secondary | ICD-10-CM | POA: Diagnosis not present

## 2021-06-09 ENCOUNTER — Other Ambulatory Visit: Payer: Self-pay

## 2021-06-09 ENCOUNTER — Ambulatory Visit: Payer: Medicare HMO

## 2021-06-10 DIAGNOSIS — I25118 Atherosclerotic heart disease of native coronary artery with other forms of angina pectoris: Secondary | ICD-10-CM | POA: Diagnosis not present

## 2021-06-10 DIAGNOSIS — I5022 Chronic systolic (congestive) heart failure: Secondary | ICD-10-CM | POA: Diagnosis not present

## 2021-06-10 DIAGNOSIS — C8511 Unspecified B-cell lymphoma, lymph nodes of head, face, and neck: Secondary | ICD-10-CM | POA: Diagnosis not present

## 2021-06-10 DIAGNOSIS — R630 Anorexia: Secondary | ICD-10-CM | POA: Diagnosis not present

## 2021-06-10 DIAGNOSIS — I130 Hypertensive heart and chronic kidney disease with heart failure and stage 1 through stage 4 chronic kidney disease, or unspecified chronic kidney disease: Secondary | ICD-10-CM | POA: Diagnosis not present

## 2021-06-10 DIAGNOSIS — E1122 Type 2 diabetes mellitus with diabetic chronic kidney disease: Secondary | ICD-10-CM | POA: Diagnosis not present

## 2021-06-10 DIAGNOSIS — E039 Hypothyroidism, unspecified: Secondary | ICD-10-CM | POA: Diagnosis not present

## 2021-06-10 DIAGNOSIS — N184 Chronic kidney disease, stage 4 (severe): Secondary | ICD-10-CM | POA: Diagnosis not present

## 2021-06-10 DIAGNOSIS — C8519 Unspecified B-cell lymphoma, extranodal and solid organ sites: Secondary | ICD-10-CM | POA: Diagnosis not present

## 2021-06-13 ENCOUNTER — Other Ambulatory Visit: Payer: Self-pay

## 2021-06-13 ENCOUNTER — Emergency Department (HOSPITAL_COMMUNITY)
Admission: EM | Admit: 2021-06-13 | Discharge: 2021-06-13 | Disposition: A | Discharge status: Home / Self Care | Payer: Medicare HMO | Attending: Emergency Medicine | Admitting: Emergency Medicine

## 2021-06-13 ENCOUNTER — Encounter (HOSPITAL_COMMUNITY): Payer: Self-pay | Admitting: Emergency Medicine

## 2021-06-13 DIAGNOSIS — I130 Hypertensive heart and chronic kidney disease with heart failure and stage 1 through stage 4 chronic kidney disease, or unspecified chronic kidney disease: Secondary | ICD-10-CM | POA: Diagnosis not present

## 2021-06-13 DIAGNOSIS — Z794 Long term (current) use of insulin: Secondary | ICD-10-CM | POA: Diagnosis not present

## 2021-06-13 DIAGNOSIS — I2510 Atherosclerotic heart disease of native coronary artery without angina pectoris: Secondary | ICD-10-CM | POA: Diagnosis not present

## 2021-06-13 DIAGNOSIS — Z79899 Other long term (current) drug therapy: Secondary | ICD-10-CM | POA: Diagnosis not present

## 2021-06-13 DIAGNOSIS — E039 Hypothyroidism, unspecified: Secondary | ICD-10-CM | POA: Insufficient documentation

## 2021-06-13 DIAGNOSIS — N183 Chronic kidney disease, stage 3 unspecified: Secondary | ICD-10-CM | POA: Insufficient documentation

## 2021-06-13 DIAGNOSIS — Z8572 Personal history of non-Hodgkin lymphomas: Secondary | ICD-10-CM | POA: Diagnosis not present

## 2021-06-13 DIAGNOSIS — R21 Rash and other nonspecific skin eruption: Secondary | ICD-10-CM | POA: Diagnosis not present

## 2021-06-13 DIAGNOSIS — B029 Zoster without complications: Secondary | ICD-10-CM | POA: Insufficient documentation

## 2021-06-13 DIAGNOSIS — I5022 Chronic systolic (congestive) heart failure: Secondary | ICD-10-CM | POA: Insufficient documentation

## 2021-06-13 MED ORDER — VALACYCLOVIR HCL 500 MG PO TABS
1000.0000 mg | ORAL_TABLET | Freq: Once | ORAL | Status: AC
  Administered 2021-06-13: 1000 mg via ORAL
  Filled 2021-06-13: qty 2

## 2021-06-13 MED ORDER — VALACYCLOVIR HCL 1 G PO TABS: 1000.0000 mg | ORAL_TABLET | Freq: Three times a day (TID) | ORAL | 0 refills | Status: AC

## 2021-06-13 MED ORDER — CEPHALEXIN 500 MG PO CAPS
500.0000 mg | ORAL_CAPSULE | Freq: Once | ORAL | Status: AC
  Administered 2021-06-13: 500 mg via ORAL
  Filled 2021-06-13: qty 1

## 2021-06-13 MED ORDER — CEPHALEXIN 500 MG PO CAPS: 500.0000 mg | ORAL_CAPSULE | Freq: Four times a day (QID) | ORAL | 0 refills | Status: AC

## 2021-06-13 NOTE — ED Provider Notes (Signed)
Emergency Medicine Provider Triage Evaluation Note  Rodney Lee , a 80 y.o. male  was evaluated in triage.  Pt complains of abdominal wound infection and rash on back.  History of B-cell lymphoma, wife was changing dressing on abdominal tumor and noticed purulent drainage this morning.  Just finished radiation treatment.  While helping him shower she also noted a vesicular looking rash on his back.  History of shingles.  Review of Systems  Positive: Wound infection, rash Negative: Fevers, chills, abdominal pain  Physical Exam  BP 104/61 (BP Location: Left Arm)   Pulse 78   Temp 99.1 F (37.3 C) (Oral)   Resp 18   SpO2 99%  Gen:   Awake, no distress   Resp:  Normal effort  MSK:   Moves extremities without difficulty  Other:  Vesicular rash on back and chest following dermatomal pattern, approximately 3 cm exterior abdominal tumor with increased redness and purulent drainage  Medical Decision Making  Medically screening exam initiated at 4:21 PM.  Appropriate orders placed.  [name] was informed that the remainder of the evaluation will be completed by another provider, this initial triage assessment does not replace that evaluation, and the importance of remaining in the ED until their evaluation is complete.     Estill Cotta 06/13/21 1623    Margette Fast, MD 06/14/21 [phone]

## 2021-06-13 NOTE — ED Triage Notes (Signed)
Patient has a vesicular fluid-filled blistered rash on his R back area and some areas on his r nipple, wife noticed it today. Also wife thinks his tumor area on his lower stomach is infected. Pt had radiation last Tuesday.

## 2021-06-13 NOTE — ED Provider Notes (Signed)
[facility] DEPT Provider Note   CSN: [id number] Arrival date & time: 06/13/21  1539     History No chief complaint on file.   Rodney Lee is a 80 y.o. male.  Patient has lymphoma and has a large mass to his abdomen that extruded out of the abdomen.  Patient was brought to the emergency department for rash on his back and his wife is concerned he protrusions getting infected.  He recently had 10 bouts of radiation  The history is provided by the patient, medical records and a relative. No language interpreter was used.  Rash Location: Chest and back. Quality: blistering   Severity:  Moderate Onset quality:  Sudden Timing:  Constant Progression:  Worsening Chronicity:  New Context: not animal contact   Relieved by:  Nothing Worsened by:  Nothing Associated symptoms: no abdominal pain, no diarrhea, no fatigue and no headaches       Past Medical History:  Diagnosis Date   Atrial fibrillation ([place])    CHADS2Vasc is at least 5, on Xarelto   CAD (coronary artery disease)    Cancer ([place])    b-cell lymphoma   cardiomyopathy    MDT ICD, Dr. Lovena Le 2009   CHF (congestive heart failure) ([place])    Diabetes mellitus    Exogenous obesity    Hyperlipidemia    Hypertension    Hypothyroidism    MI, old 47   ANTEROLATERAL, PTCA LAD per pt   Sinus bradycardia     Patient Active Problem List   Diagnosis Date Noted   Anemia due to zinc deficiency 06/07/2021   Deficiency anemia 06/01/2021   Pressure injury of skin 03/04/2021   Hyperglycemia due to diabetes mellitus ([place]) 03/04/2021   Hypothyroidism 03/04/2021   Port-A-Cath in place 02/03/2021   High grade B-cell lymphoma ([place]) 01/26/2021   VF (ventricular fibrillation) ([place]) 03/01/2017   Coronary artery disease involving native coronary artery of native heart with angina pectoris ([place]) 10/16/2016   LBBB (left bundle branch block) 10/16/2016   Stage 3 chronic kidney disease ([place]) 10/16/2016    Ventricular tachycardia (paroxysmal) 01/21/2015   Diabetes mellitus due to underlying condition with stage 3 chronic kidney disease ([place]) 02/13/2014   PAF (paroxysmal atrial fibrillation) ([place]) 10/08/2013   Chronic ischemic heart disease, unspecified 06/20/2011   Dyslipidemia 06/20/2011   Ischemic cardiomyopathy 05/16/2009   SINUS BRADYCARDIA 61/68/3729   Chronic systolic CHF (congestive heart failure) ([place]) 05/16/2009   Automatic implantable cardioverter-defibrillator in situ 05/16/2009    Past Surgical History:  Procedure Laterality Date   ANGIOPLASTY     CARDIAC CATHETERIZATION  03/13/1994   ACUTE MI WITH TOTAL OCCLUSION OF MID LAD. REPERFUSION AFTER CROSSING WITH A GUIDEWIRE. POOR RIGHT TO LEFT COLLATERAL FLOW. Pt states had PTCA   CARDIAC DEFIBRILLATOR PLACEMENT     CARDIOVASCULAR STRESS TEST  03/11/2008   EF 30%. NO REVERSIBLE ISCHEMIA   EP IMPLANTABLE DEVICE N/A 09/28/2015   Procedure:  ICD Generator Changeout;  Surgeon: Evans Lance, MD;  Location: [facility];  Service: Cardiovascular;  Laterality: N/A;   IR IMAGING GUIDED PORT INSERTION  01/28/2021   US ECHOCARDIOGRAPHY  04/26/2010   EF 35-40%. MODERATE LVH WITH MODERATE GLOBAL LV SYSTOLIC DYSFUNCTION AND IMPAIRED RELAXATION. MILD AORTIC SCLEROSIS WITHOUT STENOSIS. NORMAL PULMONARY ARTERY PRESSURE.   US ECHOCARDIOGRAPHY  10/07/2004   EF 30-35%       Family History  Problem Relation Age of Onset   Heart disease Mother    Heart  attack Mother     Social History   Tobacco Use   Smoking status: Never   Smokeless tobacco: Never  Vaping Use   Vaping Use: Never used  Substance Use Topics   Alcohol use: No   Drug use: No    Home Medications Prior to Admission medications   Medication Sig Start Date End Date Taking? Authorizing Provider  cephALEXin (KEFLEX) 500 MG capsule Take 1 capsule (500 mg total) by mouth 4 (four) times daily. 06/13/21  Yes Milton Ferguson, MD  valACYclovir (VALTREX) 1000 MG tablet Take 1  tablet (1,000 mg total) by mouth 3 (three) times daily. 06/13/21  Yes Milton Ferguson, MD  allopurinol (ZYLOPRIM) 300 MG tablet Take 1 tablet (300 mg total) by mouth daily. Patient not taking: Reported on 06/01/2021 01/26/21   Orson Slick, MD  amiodarone (PACERONE) 200 MG tablet Take 1 tablet (200 mg total) by mouth daily. 04/09/21   Croitoru, Dani Gobble, MD  blood glucose meter kit and supplies Dispense based on patient and insurance preference. Use up to four times daily as directed. (FOR ICD-10 E10.9, E11.9). 03/04/21   Dessa Phi, DO  carvedilol (COREG) 6.25 MG tablet TAKE 1 TABLET BY MOUTH  TWICE DAILY WITH A MEAL 02/26/20   Croitoru, Mihai, MD  citalopram (CELEXA) 20 MG tablet Take 20 mg by mouth daily.    [provider]  famotidine (PEPCID) 20 MG tablet Take 20 mg by mouth 2 (two) times daily.    [provider]  insulin lispro (HUMALOG KWIKPEN) 100 UNIT/ML KwikPen Use following sliding scale provided, at meal times as long as eating > 50% of your meal, up to three times daily. 03/04/21   Dessa Phi, DO  Insulin Pen Needle 31G X 5 MM MISC Use with insulin up to three times daily with meals 03/04/21   Dessa Phi, DO  isosorbide mononitrate (IMDUR) 60 MG 24 hr tablet Take 60 mg in the morning and 30 mg (half a tablet) in the evening. 09/03/20   Croitoru, Mihai, MD  levothyroxine (SYNTHROID) 125 MCG tablet Take 125 mcg by mouth daily. 01/19/21   [provider]  Multiple Vitamin (MULTIVITAMIN) tablet Take 1 tablet by mouth daily.    [provider]  nitroGLYCERIN (NITROSTAT) 0.4 MG SL tablet Place 1 tablet (0.4 mg total) under the tongue every 5 (five) minutes as needed for chest pain. 09/03/20   Croitoru, Mihai, MD  ONETOUCH ULTRA test strip USE 1 STRIP TO CHECK GLUCOSE AS DIRECTED 01/14/21   [provider]  zinc gluconate 50 MG tablet Take 1 tablet (50 mg total) by mouth daily. 06/07/21   Janith Lima, MD    Allergies    Canagliflozin and  Lipitor [atorvastatin calcium]  Review of Systems   Review of Systems  Constitutional:  Negative for appetite change and fatigue.  HENT:  Negative for congestion, ear discharge and sinus pressure.   Eyes:  Negative for discharge.  Respiratory:  Negative for cough.   Cardiovascular:  Negative for chest pain.  Gastrointestinal:  Negative for abdominal pain and diarrhea.  Genitourinary:  Negative for frequency and hematuria.  Musculoskeletal:  Negative for back pain.  Skin:  Positive for rash.  Neurological:  Negative for seizures and headaches.  Psychiatric/Behavioral:  Negative for hallucinations.    Physical Exam Updated Vital Signs BP 117/69   Pulse 60   Temp 99.1 F (37.3 C) (Oral)   Resp 17   SpO2 100%   Physical Exam Vitals and nursing  note reviewed.  Constitutional:      Appearance: He is well-developed.  HENT:     Head: Normocephalic.     Nose: Nose normal.  Eyes:     General: No scleral icterus.    Conjunctiva/sclera: Conjunctivae normal.  Neck:     Thyroid: No thyromegaly.  Cardiovascular:     Rate and Rhythm: Normal rate and regular rhythm.     Heart sounds: No murmur heard.   No friction rub. No gallop.  Pulmonary:     Breath sounds: No stridor. No wheezing or rales.  Chest:     Chest wall: No tenderness.  Abdominal:     General: There is no distension.     Tenderness: There is no abdominal tenderness. There is no rebound.  Musculoskeletal:        General: Normal range of motion.     Cervical back: Neck supple.  Lymphadenopathy:     Cervical: No cervical adenopathy.  Skin:    Findings: Erythema and rash present.     Comments: Patient has a vesicular rash with upper back and chest which is consistent with shingles.  He also has a protruding mass from his abdomen which is mildly inflamed around the border.  Neurological:     Mental Status: He is alert and oriented to person, place, and time.     Motor: No abnormal muscle tone.     Coordination:  Coordination normal.  Psychiatric:        Behavior: Behavior normal.    ED Results / Procedures / Treatments   Labs (all labs ordered are listed, but only abnormal results are displayed) Labs Reviewed  CBC WITH DIFFERENTIAL/PLATELET  BASIC METABOLIC PANEL    EKG None  Radiology No results found.  Procedures Procedures   Medications Ordered in ED Medications  cephALEXin (KEFLEX) capsule 500 mg (has no administration in time range)  valACYclovir (VALTREX) tablet 1,000 mg (has no administration in time range)    ED Course  I have reviewed the triage vital signs and the nursing notes.  Pertinent labs & imaging results that were available during my care of the patient were reviewed by me and considered in my medical decision making (see chart for details).    MDM Rules/Calculators/A&P                           Patient will be started on Valtrex for shingles and Keflex for possible skin infection around his tumor.  He will follow-up with cancer doctor this week Final Clinical Impression(s) / ED Diagnoses Final diagnoses:  Herpes zoster without complication    Rx / DC Orders ED Discharge Orders          Ordered    valACYclovir (VALTREX) 1000 MG tablet  3 times daily        06/13/21 1710    cephALEXin (KEFLEX) 500 MG capsule  4 times daily        06/13/21 1710             Milton Ferguson, MD 06/13/21 [phone]

## 2021-06-13 NOTE — Discharge Instructions (Addendum)
Follow-up with your oncologist this week.  Call Monday and tell them that you were seen in the emergency department and the doctor felt like he needed to be seen this week

## 2021-06-14 ENCOUNTER — Telehealth: Payer: Self-pay | Admitting: *Deleted

## 2021-06-14 NOTE — Telephone Encounter (Signed)
Hassan Rowan left a message stating "Rodney Lee went to the ED because the tumor looked infected. He has been diagnosed with Shingles and the ED recommended that he come in to see Dr Lorenso Courier so he could treat it."   RN confirmed that patient has been started on Valtrex for shingles and Keflex for wound infection.   Wife will call on Friday to update on how he is doing.

## 2021-06-14 NOTE — Progress Notes (Signed)
Remote ICD transmission.   

## 2021-06-15 ENCOUNTER — Telehealth: Payer: Self-pay | Admitting: *Deleted

## 2021-06-15 NOTE — Telephone Encounter (Signed)
Patient called to ask if patient can be seen sooner.  She reports patient is "talking out of his head", and his legs are "like jello".  She reports that the confusion is not new but just feels like it is worse.    She thinks he needs to have his labs checked for possible low iron.  Questioned if the patient has had any urological symptoms that might be along the lines of UTI she reports dark urine color and increased frequency but denies all other issues.    Patient has fallen twice today and reports that is more so than normal.  Routing to MD to ask if he is willing to check labs and urine and evaluate the patient.  Patient has shingles that is being treated.  Wife denies any open blisters.

## 2021-06-15 NOTE — Telephone Encounter (Signed)
[place] [phone]).  Connected with son Dennis Hegeman who answered question of start date of complete leave of absence. "My dad had not worked this whole year."

## 2021-06-15 NOTE — Telephone Encounter (Signed)
Please add [name] on to Rodney Lee schedule for tomorrow with labs before the visit.

## 2021-06-16 ENCOUNTER — Inpatient Hospital Stay (HOSPITAL_BASED_OUTPATIENT_CLINIC_OR_DEPARTMENT_OTHER): Payer: Medicare HMO | Admitting: Physician Assistant

## 2021-06-16 ENCOUNTER — Other Ambulatory Visit: Payer: Self-pay

## 2021-06-16 ENCOUNTER — Other Ambulatory Visit: Payer: Self-pay | Admitting: Physician Assistant

## 2021-06-16 ENCOUNTER — Inpatient Hospital Stay: Payer: Medicare HMO | Attending: Hematology and Oncology

## 2021-06-16 DIAGNOSIS — R110 Nausea: Secondary | ICD-10-CM | POA: Diagnosis not present

## 2021-06-16 DIAGNOSIS — C8510 Unspecified B-cell lymphoma, unspecified site: Secondary | ICD-10-CM

## 2021-06-16 DIAGNOSIS — R944 Abnormal results of kidney function studies: Secondary | ICD-10-CM | POA: Diagnosis not present

## 2021-06-16 DIAGNOSIS — D6959 Other secondary thrombocytopenia: Secondary | ICD-10-CM | POA: Insufficient documentation

## 2021-06-16 DIAGNOSIS — R5383 Other fatigue: Secondary | ICD-10-CM | POA: Diagnosis not present

## 2021-06-16 DIAGNOSIS — Z95828 Presence of other vascular implants and grafts: Secondary | ICD-10-CM

## 2021-06-16 DIAGNOSIS — D6481 Anemia due to antineoplastic chemotherapy: Secondary | ICD-10-CM | POA: Insufficient documentation

## 2021-06-16 LAB — CBC WITH DIFFERENTIAL (CANCER CENTER ONLY)
Abs Immature Granulocytes: 0.01 10*3/uL (ref 0.00–0.07)
Basophils Absolute: 0 10*3/uL (ref 0.0–0.1)
Basophils Relative: 1 %
Eosinophils Absolute: 0 10*3/uL (ref 0.0–0.5)
Eosinophils Relative: 0 %
HCT: 29.5 % — ABNORMAL LOW (ref 39.0–52.0)
Hemoglobin: 10.2 g/dL — ABNORMAL LOW (ref 13.0–17.0)
Immature Granulocytes: 0 %
Lymphocytes Relative: 53 %
Lymphs Abs: 1.3 10*3/uL (ref 0.7–4.0)
MCH: 35.9 pg — ABNORMAL HIGH (ref 26.0–34.0)
MCHC: 34.6 g/dL (ref 30.0–36.0)
MCV: 103.9 fL — ABNORMAL HIGH (ref 80.0–100.0)
Monocytes Absolute: 0.7 10*3/uL (ref 0.1–1.0)
Monocytes Relative: 28 %
Neutro Abs: 0.5 10*3/uL — ABNORMAL LOW (ref 1.7–7.7)
Neutrophils Relative %: 18 %
Platelet Count: 30 10*3/uL — ABNORMAL LOW (ref 150–400)
RBC: 2.84 MIL/uL — ABNORMAL LOW (ref 4.22–5.81)
RDW: 17.7 % — ABNORMAL HIGH (ref 11.5–15.5)
Smear Review: NORMAL
WBC Count: 2.5 10*3/uL — ABNORMAL LOW (ref 4.0–10.5)
nRBC: 0 % (ref 0.0–0.2)

## 2021-06-16 LAB — CMP (CANCER CENTER ONLY)
ALT: 27 U/L (ref 0–44)
AST: 38 U/L (ref 15–41)
Albumin: 3.7 g/dL (ref 3.5–5.0)
Alkaline Phosphatase: 51 U/L (ref 38–126)
Anion gap: 9 (ref 5–15)
BUN: 44 mg/dL — ABNORMAL HIGH (ref 8–23)
CO2: 23 mmol/L (ref 22–32)
Calcium: 9.1 mg/dL (ref 8.9–10.3)
Chloride: 107 mmol/L (ref 98–111)
Creatinine: 2.56 mg/dL — ABNORMAL HIGH (ref 0.61–1.24)
GFR, Estimated: 24 mL/min — ABNORMAL LOW (ref >60)
Glucose, Bld: 238 mg/dL — ABNORMAL HIGH (ref 70–99)
Potassium: 4.1 mmol/L (ref 3.5–5.1)
Sodium: 139 mmol/L (ref 135–145)
Total Bilirubin: 0.5 mg/dL (ref 0.3–1.2)
Total Protein: 6.6 g/dL (ref 6.5–8.1)

## 2021-06-16 LAB — URIC ACID: Uric Acid, Serum: 4.1 mg/dL (ref 3.7–8.6)

## 2021-06-16 LAB — LACTATE DEHYDROGENASE: LDH: 166 U/L (ref 98–192)

## 2021-06-16 MED ORDER — SODIUM CHLORIDE 0.9 % IV SOLN: Freq: Once | INTRAVENOUS | Status: AC

## 2021-06-16 MED ORDER — SODIUM CHLORIDE 0.9% FLUSH
10.0000 mL | Freq: Once | INTRAVENOUS | Status: AC
  Administered 2021-06-16: 10 mL

## 2021-06-16 NOTE — Progress Notes (Signed)
Pt received 1L NS over 2 hours, tolerated well. Port flushed and de-accessed. Small wound on the back of pt's head assessed and redressed. Wound superficial, small nickel sized, from fall at home. Wound examined by Dede Query PA. Pt stable at d/c. Accompanied by wife.

## 2021-06-17 ENCOUNTER — Encounter: Payer: Self-pay | Admitting: Hematology and Oncology

## 2021-06-17 ENCOUNTER — Other Ambulatory Visit: Payer: Self-pay

## 2021-06-17 DIAGNOSIS — C8511 Unspecified B-cell lymphoma, lymph nodes of head, face, and neck: Secondary | ICD-10-CM | POA: Diagnosis not present

## 2021-06-17 DIAGNOSIS — E039 Hypothyroidism, unspecified: Secondary | ICD-10-CM | POA: Diagnosis not present

## 2021-06-17 DIAGNOSIS — I5022 Chronic systolic (congestive) heart failure: Secondary | ICD-10-CM | POA: Diagnosis not present

## 2021-06-17 DIAGNOSIS — C8519 Unspecified B-cell lymphoma, extranodal and solid organ sites: Secondary | ICD-10-CM | POA: Diagnosis not present

## 2021-06-17 DIAGNOSIS — I25118 Atherosclerotic heart disease of native coronary artery with other forms of angina pectoris: Secondary | ICD-10-CM | POA: Diagnosis not present

## 2021-06-17 DIAGNOSIS — E1122 Type 2 diabetes mellitus with diabetic chronic kidney disease: Secondary | ICD-10-CM | POA: Diagnosis not present

## 2021-06-17 DIAGNOSIS — R630 Anorexia: Secondary | ICD-10-CM | POA: Diagnosis not present

## 2021-06-17 DIAGNOSIS — I130 Hypertensive heart and chronic kidney disease with heart failure and stage 1 through stage 4 chronic kidney disease, or unspecified chronic kidney disease: Secondary | ICD-10-CM | POA: Diagnosis not present

## 2021-06-17 DIAGNOSIS — N184 Chronic kidney disease, stage 4 (severe): Secondary | ICD-10-CM | POA: Diagnosis not present

## 2021-06-17 MED ORDER — ONETOUCH ULTRA VI STRP: ORAL_STRIP | 1 refills | Status: AC

## 2021-06-17 NOTE — Progress Notes (Signed)
[place] Telephone:([phone]) [phone]   Fax:(336) [phone]  PROGRESS NOTE  Patient Care Team: Janith Lima, MD as PCP - General (Internal Medicine) Evans Lance, MD as PCP - Electrophysiology (Cardiology) Croitoru, Dani Gobble, MD as PCP - Cardiology (Cardiology) [place] Registered Nurse [facility] and Palliative Medicine)  Hematological/Oncological History # High Grade B Cell Lymphoma, Stage III/IV 12/10/2020: CT Abdomen/Pelvis shows a soft tissue mass at 6.7 cm with soft tissue stranding.  12/14/2020: punch biopsy shows findings consistent with a high grade B cell lymphoma.  01/07/2021: establish care with Dr. Lorenso Courier  01/21/2021: PET CT scan shows diffuse disease with anterior abdominal wall lesion, pulmonary nodule, lesions along the cortex of the left/right kidney.  02/04/2021: Cycle 1 Day 1 of R-GCVP 02/25/2021: Cycle 2 Day 1 of R-GCVP. Held Cycle 2 Day 8 gemcitabine due to marked hyperglycemia. Patient admitted.  03/17/2021: Cycle 3 Day 1 of R-GCVP 04/07/2021: Cycle 4 Day 1 of R-GCVP 04/28/2021: Cycle 5 delayed due to cytopenias 05/05/2021: Chemotherapy held due to failure to respond to therapy noted on PET CT scan. Poor recovery of blood counts and continued deconditioning.   Interval History:  Rodney Lee 80 y.o. male with medical history significant for advanced stage high grade B cell lymphoma who presents for a follow up visit. The patient's last visit was on 06/02/2021. In the interim since the last visit he has completed palliative radiation to the lymphoma mass in his abdomen on 06/08/2021. Additionally, he went to ED on 06/13/2021 and diagnosed with herpes zoster on his back and possible skin infection around his abdominal mass. He was started on Valtrex and Keflex.   On exam today [name] is accompanied by his wife. Since completion of radiation last week, Mr. Nyquist fatigue has progressively worsened. Due weakness primarily in the lower extremities,  there is increase incidence of falls. He had a fall earlier today and scraped his scalp. He has a poor PO intake over the last several days. He continues to have nausea and dry heaves that has improved today since taking prescribed antiemetics. He denies any vomiting episodes. Patient denies any abdominal pain and his bowel movememnts are unchanged. His last bowel movement was this morning. He denies any signs of active bleeding. Patient's wife adds that patient is more confused lately that originally started with initiation of chemotherapy in June 2020. He denies any fevers, chills, night sweats, shortness of breath, chest pain or cough. He has no other complaints. A full 10 point ROS is listed below.  MEDICAL HISTORY:  Past Medical History:  Diagnosis Date   Atrial fibrillation ([place])    CHADS2Vasc is at least 5, on Xarelto   CAD (coronary artery disease)    Cancer ([place])    b-cell lymphoma   cardiomyopathy    MDT ICD, Dr. Lovena Le 2009   CHF (congestive heart failure) ([place])    Diabetes mellitus    Exogenous obesity    Hyperlipidemia    Hypertension    Hypothyroidism    MI, old 68   ANTEROLATERAL, PTCA LAD per pt   Sinus bradycardia     SURGICAL HISTORY: Past Surgical History:  Procedure Laterality Date   ANGIOPLASTY     CARDIAC CATHETERIZATION  03/13/1994   ACUTE MI WITH TOTAL OCCLUSION OF MID LAD. REPERFUSION AFTER CROSSING WITH A GUIDEWIRE. POOR RIGHT TO LEFT COLLATERAL FLOW. Pt states had PTCA   CARDIAC DEFIBRILLATOR PLACEMENT     CARDIOVASCULAR STRESS TEST  03/11/2008  EF 30%. NO REVERSIBLE ISCHEMIA   EP IMPLANTABLE DEVICE N/A 09/28/2015   Procedure:  ICD Generator Changeout;  Surgeon: Evans Lance, MD;  Location: [place] [facility];  Service: Cardiovascular;  Laterality: N/A;   IR IMAGING GUIDED PORT INSERTION  01/28/2021   US ECHOCARDIOGRAPHY  04/26/2010   EF 35-40%. MODERATE LVH WITH MODERATE GLOBAL LV SYSTOLIC DYSFUNCTION AND IMPAIRED RELAXATION. MILD AORTIC SCLEROSIS  WITHOUT STENOSIS. NORMAL PULMONARY ARTERY PRESSURE.   US ECHOCARDIOGRAPHY  10/07/2004   EF 30-35%    SOCIAL HISTORY: Social History   Socioeconomic History   Marital status: Married    Spouse name: Not on file   Number of children: Not on file   Years of education: Not on file   Highest education level: Not on file  Occupational History   Occupation: Retired  Tobacco Use   Smoking status: Never   Smokeless tobacco: Never  Vaping Use   Vaping Use: Never used  Substance and Sexual Activity   Alcohol use: No   Drug use: No   Sexual activity: Not on file  Other Topics Concern   Not on file  Social History Narrative   Lives with wife   Social Determinants of Health   Financial Resource Strain: Not on file  Food Insecurity: Not on file  Transportation Needs: Not on file  Physical Activity: Not on file  Stress: Not on file  Social Connections: Not on file  Intimate Partner Violence: Not on file    FAMILY HISTORY: Family History  Problem Relation Age of Onset   Heart disease Mother    Heart attack Mother     ALLERGIES:  is allergic to canagliflozin and lipitor [atorvastatin calcium].  MEDICATIONS:  Current Outpatient Medications  Medication Sig Dispense Refill   allopurinol (ZYLOPRIM) 300 MG tablet Take 1 tablet (300 mg total) by mouth daily. (Patient not taking: Reported on 06/01/2021) 90 tablet 1   amiodarone (PACERONE) 200 MG tablet Take 1 tablet (200 mg total) by mouth daily. 7 tablet 0   blood glucose meter kit and supplies Dispense based on patient and insurance preference. Use up to four times daily as directed. (FOR ICD-10 E10.9, E11.9). 1 each 0   carvedilol (COREG) 6.25 MG tablet TAKE 1 TABLET BY MOUTH  TWICE DAILY WITH A MEAL 180 tablet 2   cephALEXin (KEFLEX) 500 MG capsule Take 1 capsule (500 mg total) by mouth 4 (four) times daily. 28 capsule 0   citalopram (CELEXA) 20 MG tablet Take 20 mg by mouth daily.     famotidine (PEPCID) 20 MG tablet Take 20 mg by  mouth 2 (two) times daily.     insulin lispro (HUMALOG KWIKPEN) 100 UNIT/ML KwikPen Use following sliding scale provided, at meal times as long as eating > 50% of your meal, up to three times daily. 15 mL 0   Insulin Pen Needle 31G X 5 MM MISC Use with insulin up to three times daily with meals 100 each 0   isosorbide mononitrate (IMDUR) 60 MG 24 hr tablet Take 60 mg in the morning and 30 mg (half a tablet) in the evening. 135 tablet 3   levothyroxine (SYNTHROID) 125 MCG tablet Take 125 mcg by mouth daily.     Multiple Vitamin (MULTIVITAMIN) tablet Take 1 tablet by mouth daily.     nitroGLYCERIN (NITROSTAT) 0.4 MG SL tablet Place 1 tablet (0.4 mg total) under the tongue every 5 (five) minutes as needed for chest pain. 25 tablet 2   ONETOUCH  ULTRA test strip USE 1 STRIP TO CHECK GLUCOSE AS DIRECTED 100 each 1   valACYclovir (VALTREX) 1000 MG tablet Take 1 tablet (1,000 mg total) by mouth 3 (three) times daily. 21 tablet 0   zinc gluconate 50 MG tablet Take 1 tablet (50 mg total) by mouth daily. 90 tablet 1   No current facility-administered medications for this visit.    REVIEW OF SYSTEMS:   Constitutional: ( - ) fevers, ( - )  chills , ( - ) night sweats Eyes: ( - ) blurriness of vision, ( - ) double vision, ( - ) watery eyes Ears, nose, mouth, throat, and face: ( - ) mucositis, ( - ) sore throat Respiratory: ( - ) cough, ( - ) dyspnea, ( - ) wheezes Cardiovascular: ( - ) palpitation, ( - ) chest discomfort, ( - ) lower extremity swelling Gastrointestinal:  (+) nausea, ( - ) heartburn, ( - ) change in bowel habits Skin: ( - ) abnormal skin rashes Lymphatics: ( - ) new lymphadenopathy, ( - ) easy bruising Neurological: ( - ) numbness, ( - ) tingling, ( - ) new weaknesses Behavioral/Psych: ( - ) mood change, ( - ) new changes  All other systems were reviewed with the patient and are negative.  PHYSICAL EXAMINATION: ECOG PERFORMANCE STATUS: 2 - Symptomatic, <50% confined to bed  Vitals:    06/16/21 1402  BP: 115/60  Pulse: 61  Resp: 17  Temp: 99.2 F (37.3 C)  SpO2: 100%    Filed Weights     GENERAL: ill appearing elderly Caucasian male. alert, no distress and comfortable SKIN: Laceration on the back of the head with no active bleeding. Otherwise skin color, texture, turgor are normal, no rashes or significant lesions EYES: conjunctiva are pink and non-injected, sclera clear LUNGS: clear to auscultation and percussion with normal breathing effort HEART: regular rate & rhythm and no murmurs and no lower extremity edema ABDOMEN: Smaller left-sided lesion with some necrosis and areas of purulent discharge (see photo below). .  Consistent with lymphoma noted on PET CT scan. Musculoskeletal: no cyanosis of digits and no clubbing  PSYCH: alert & oriented x 3, fluent speech NEURO: no focal motor/sensory deficits      LABORATORY DATA:  I have reviewed the data as listed CBC Latest Ref Rng & Units 06/16/2021 06/02/2021 06/01/2021  WBC 4.0 - 10.5 K/uL 2.5(L) 6.2 5.7  Hemoglobin 13.0 - 17.0 g/dL 10.2(L) 9.8(L) 10.3(L)  Hematocrit 39.0 - 52.0 % 29.5(L) 28.9(L) 30.7(L)  Platelets 150 - 400 K/uL 30(L) 50(L) 66.0(L)    CMP Latest Ref Rng & Units 06/16/2021 06/02/2021 05/05/2021  Glucose 70 - 99 mg/dL 238(H) 288(H) 267(H)  BUN 8 - 23 mg/dL 44(H) 33(H) 35(H)  Creatinine 0.61 - 1.24 mg/dL 2.56(H) 2.17(H) 2.03(H)  Sodium 135 - 145 mmol/L 139 138 139  Potassium 3.5 - 5.1 mmol/L 4.1 4.4 4.5  Chloride 98 - 111 mmol/L 107 103 107  CO2 22 - 32 mmol/L 23 26 25   Calcium 8.9 - 10.3 mg/dL 9.1 9.2 8.5(L)  Total Protein 6.5 - 8.1 g/dL 6.6 6.1(L) 5.4(L)  Total Bilirubin 0.3 - 1.2 mg/dL 0.5 0.4 0.4  Alkaline Phos 38 - 126 U/L 51 82 86  AST 15 - 41 U/L 38 25 28  ALT 0 - 44 U/L 27 18 22     RADIOGRAPHIC STUDIES: I have personally reviewed the radiological images as listed and agreed with the findings in the report: involvement of the left abdomen, the kidney's  bilaterally, and pulmonary  nodule.   CUP PACEART REMOTE DEVICE CHECK  Result Date: 06/03/2021 Scheduled remote reviewed. Normal device function.  Optivol is back to normal limits but was elevated with a decrease in activity noted Next remote 91 days. Kathy Breach, RN, CCDS, [employer]    ASSESSMENT & PLAN PEARSON PICOU 80 y.o. male with medical history significant for advanced stage high grade B cell lymphoma who presents for a follow up visit.   Given Mr. Mcguire's complicated medical history with cardiac dysfunction, renal dysfunction, and advanced age I am concerned that he would not be able to tolerate anthracycline-based therapy such as R mini CHOP.  As such the best regimen to proceed with given his comorbidities would be R-GCVP which is not toxic to the heart or filter to the kidney and can be used in patients with advanced age.  At this time we recommend proceeding with R-GCVP chemotherapy given his advanced age, cardiac dysfunction, and poor renal function.  Previously discussed the risks and benefits of treatment including nausea, vomiting, diarrhea, neuropathy, and allergic reactions.  Patient has an IPI showing he has high risk with lower rates of success with these treatment regimens.  Patient voices understanding of the risks and benefits of treatment was willing to proceed.  We will plan for 21 day cycles x 6 cycles. Rituximab 381m/m2 IV on Day 1, Cyclophosphamide 750 mg /m2 on Day 1, Vincristine 1.459mm2 (capped at 82m30mon Day 1, and Prednisone 28m73my 1-5 (protocol calls for prednisolone, but will adjust to lower dose prednisone). The Gemcitabine is to be administered on Day 1-8 and will be 750 mg/m2 IV over 30 minutes once per day on days 1 & 8 for Cycle 1, 875 mg/m2 IV over 30 minutes once per day on days 1 & 8 for Cycle 2, and 1000 mg/m2 IV over 30 minutes once per day on days 1 & 8 for Cycle 3 and onward.   Unfortunately on 05/05/2021 the patient was found to have continued low counts.  PET/CT on  05/03/2021 shows persistent FDG activity consistent with a Deauville 5 in the superficial abdominal mass.  Given these findings I would recommend we pursue comfort based care only.   Patient completed palliative radiation (30 Gy in 10 Fx) to the superficial abdominal mass 06/08/2021.   # High Grade B Cell Lymphoma, Stage III/IV --initial PET CT scan findings consistent with at least Stage III disease, with organ involvement most likely a Stage IV --avoiding anthracycline therapy and alternative regimens given his poor cardiac and renal function -- 02/04/2021 was Cycle 1 Day 1 of R-GCVP --Cycle 2 Day 8 was held due to hyperglycemia. Cycle  4 Day 8 held due to cytopenias.  --On 06/02/2021, decision was made to proceed with comfort based care due to persistent disease seen on PET scan on 05/03/2021 and continued low blood counts. --Palliative radiation was completed to superficial abdominal mass on 06/08/2021.    Plan: --Labs today were reviewed. Anemia has improved 10.2, continue to monitor. ANC has decreased to 0.5, continue to monitor. Plt count has worsened to 30K, not signs of active bleeding so monitor closely. Creatinine has slightly worsened to 2.56.  --Administered 1 L of IV fluids over 2 hours. Plan to follow up by phone tomorrow and determine if additional days of IV fluids is needed.  --Placed consultation with palliative care APP.  --RTC on 06/30/2021 with labs.   # Skin Tear, improved # Fall from Standing --  His falls occur when he attempts to walk to the restroom by himself without assistance.  Encourage him to ask for help and use walker.  --no further bleeding, well healing at this time.  --No signs of infection --Continue to monitor.  #Chemotherapy Induced Anemia #Chemotherapy Induced Thrombocytopenia --Hgb 10.2 today --Plt 30 today --WBC 2.5, ANC 0.5 --continue to monitor with transfusion support as needed.   #Supportive Care -- chemotherapy education complete -- port  placed -- zofran 20m q8H PRN and compazine 125mPO q6H for nausea -- allopurinol 30027mO daily for TLS prophylaxis -- EMLA cream for port -- no pain medication required at this time.   No orders of the defined types were placed in this encounter.  All questions were answered. The patient knows to call the clinic with any problems, questions or concerns.  I have spent a total of 30 minutes minutes of face-to-face and non-face-to-face time, preparing to see the patient, obtaining and/or reviewing separately obtained history, performing a medically appropriate examination, counseling and educating the patient, ordering medications/tests, referring and communicating with other health care professionals, documenting clinical information in the electronic health record, and care coordination.    IreLincoln BrighamA-C Department of Hematology/Oncology [facility]: [phone] was seen with Dr. DorLorenso CourierI have read the above note and personally examined the patient. I agree with the assessment and plan as noted above.  Briefly Mr. NeaNori Riis an 79 58ar old male with an aggressive diffuse large B-cell lymphoma who is well-known to my clinic.  He presents due to worsening clinical status and functional status.  He just completed radiation therapy recently and the lymph node in his abdomen has shrunk considerably.  He is having more instability on his feet and his wife does appear to be having difficulty caring for him at home.  Today we are evaluating him for electrolyte abnormalities and possible urinary tract infection.  If the patient becomes too difficult to care for at home we will need to consider evaluation for skilled nursing facility.   JohLedell PeoplesD Department of Hematology/Oncology [facility]: [phone]: [phone]: [email or web address]   06/17/2021 11:31 AM

## 2021-06-18 ENCOUNTER — Other Ambulatory Visit: Payer: Self-pay | Admitting: *Deleted

## 2021-06-18 DIAGNOSIS — D6959 Other secondary thrombocytopenia: Secondary | ICD-10-CM | POA: Diagnosis not present

## 2021-06-18 DIAGNOSIS — R110 Nausea: Secondary | ICD-10-CM | POA: Diagnosis not present

## 2021-06-18 DIAGNOSIS — D6481 Anemia due to antineoplastic chemotherapy: Secondary | ICD-10-CM | POA: Diagnosis not present

## 2021-06-18 DIAGNOSIS — R5383 Other fatigue: Secondary | ICD-10-CM | POA: Diagnosis not present

## 2021-06-18 DIAGNOSIS — C8510 Unspecified B-cell lymphoma, unspecified site: Secondary | ICD-10-CM | POA: Diagnosis not present

## 2021-06-18 DIAGNOSIS — R944 Abnormal results of kidney function studies: Secondary | ICD-10-CM | POA: Diagnosis not present

## 2021-06-18 LAB — URINALYSIS, COMPLETE (UACMP) WITH MICROSCOPIC
Bacteria, UA: NONE SEEN
Bilirubin Urine: NEGATIVE
Glucose, UA: NEGATIVE mg/dL
Hgb urine dipstick: NEGATIVE
Ketones, ur: NEGATIVE mg/dL
Leukocytes,Ua: NEGATIVE
Nitrite: NEGATIVE
Protein, ur: 30 mg/dL — AB
Specific Gravity, Urine: 1.015 (ref 1.005–1.030)
pH: 5 (ref 5.0–8.0)

## 2021-06-19 LAB — URINE CULTURE: Culture: NO GROWTH

## 2021-06-20 ENCOUNTER — Encounter: Payer: Self-pay | Admitting: Hematology and Oncology

## 2021-06-21 ENCOUNTER — Telehealth: Payer: Self-pay | Admitting: *Deleted

## 2021-06-21 ENCOUNTER — Encounter: Payer: Self-pay | Admitting: Hematology and Oncology

## 2021-06-21 NOTE — Progress Notes (Signed)
                                                                                                                                                             Patient Name: Rodney Lee MRN: [id number] DOB: [birth year] Referring Physician: Audry Pili (Profile Not Attached) Date of Service: 06/08/2021 [facility], [place]                                                        End Of Treatment Note  Diagnoses: C85.10-Unspecified B-cell lymphoma, unspecified site  Cancer Staging: Progressive metastatic high grade B cell Lymphoma  Intent: Curative  Radiation Treatment Dates: 05/26/2021 through 06/08/2021 Site Technique Total Dose (Gy) Dose per Fx (Gy) Completed Fx Beam Energies  Abdomen: Abd 3D 30/30 3 10/10 6X, 10X   Narrative: The patient tolerated radiation therapy relatively well. He developed fatigue and anticipated skin changes in the treatment field, but the tumor started improving in size.   Plan: The patient will receive a call in about one month from the radiation oncology department. He will continue follow up with Dr. Lorenso Courier as well.  ________________________________________________    Carola Rhine, [facility]

## 2021-06-21 NOTE — Telephone Encounter (Signed)
Received vm message from [place], intake specialist @ [facility].  She states that pt is being seen by Palliative Care.  Apparently Rodney Lee is declining-frequent falls and increased confusion. His wife, Rodney Lee has asked about transitioning him to full Hospice Care.  Discussed this with Dr. Lorenso Courier and he is agreeable to this and will be his attending of record for Hospice.  VM message left for Rodney Lee regarding the above.

## 2021-06-21 NOTE — Telephone Encounter (Signed)
Ived call from pt's wife seeking to review her husband's condition and to make sure it is ok for him to have Hospice care.  Hassan Rowan states he was very confused over the weekend and has fallen many times.  He is eating ok but not as much as he has in the past and drinking fluids. He is very weak.  The area on his abd is a bit smaller after radiation. It does have one area that has a loose scab on it and that area bleeds a bit.  She changes the dressing daily.  He did have labs done last week and his HGB is stable @ 10.2 and platelets are @ 30k.  No need for transfusion. Reminded her of bleeding precautions.  Also advised that his urine is clear and no evidence of infection.  Advised that as Nori Riis is showing signs of decline, that Hospice referral is appropriate. Also advised that we can certainly still see him ion the office and check his labs next week as scheduled.  Hassan Rowan is glad about that and will get him here. She will also have Hospice start as well. Advised that she can call @ any time with questions or concerns.

## 2021-06-22 DIAGNOSIS — I130 Hypertensive heart and chronic kidney disease with heart failure and stage 1 through stage 4 chronic kidney disease, or unspecified chronic kidney disease: Secondary | ICD-10-CM | POA: Diagnosis not present

## 2021-06-22 DIAGNOSIS — C8511 Unspecified B-cell lymphoma, lymph nodes of head, face, and neck: Secondary | ICD-10-CM | POA: Diagnosis not present

## 2021-06-22 DIAGNOSIS — R630 Anorexia: Secondary | ICD-10-CM | POA: Diagnosis not present

## 2021-06-22 DIAGNOSIS — N184 Chronic kidney disease, stage 4 (severe): Secondary | ICD-10-CM | POA: Diagnosis not present

## 2021-06-22 DIAGNOSIS — I5022 Chronic systolic (congestive) heart failure: Secondary | ICD-10-CM | POA: Diagnosis not present

## 2021-06-22 DIAGNOSIS — E039 Hypothyroidism, unspecified: Secondary | ICD-10-CM | POA: Diagnosis not present

## 2021-06-22 DIAGNOSIS — E1122 Type 2 diabetes mellitus with diabetic chronic kidney disease: Secondary | ICD-10-CM | POA: Diagnosis not present

## 2021-06-22 DIAGNOSIS — I25118 Atherosclerotic heart disease of native coronary artery with other forms of angina pectoris: Secondary | ICD-10-CM | POA: Diagnosis not present

## 2021-06-22 DIAGNOSIS — C8519 Unspecified B-cell lymphoma, extranodal and solid organ sites: Secondary | ICD-10-CM | POA: Diagnosis not present

## 2021-06-29 ENCOUNTER — Telehealth: Payer: Self-pay

## 2021-06-29 NOTE — Telephone Encounter (Signed)
Chrys Racer, rn from [facility] states the patient is an inpatient with them now. She wants someone to turn the patient ICD off. I told her Dr. Lovena Le is not in the office today and I will need a written order from the hospice Doctor. I told her once I receive the order from the hospice doctor, I can call [employer] to have them come to turn off his ICD. Chrys Racer agreed to get the order from the hospice doctor and fax it to [place]. She verbalized understanding.

## 2021-06-29 NOTE — Telephone Encounter (Signed)
I got the written order from the Hospice doctor Beverlyn Roux, MD to have the patient ICD deactivated. I called [employer] to go and turn the ICD off.

## 2021-06-29 NOTE — Telephone Encounter (Signed)
The hospice number is [phone] and their address is [address] [place], [place] [address].

## 2021-06-30 ENCOUNTER — Ambulatory Visit: Payer: Medicare HMO | Admitting: Hematology and Oncology

## 2021-06-30 ENCOUNTER — Telehealth: Payer: Self-pay | Admitting: *Deleted

## 2021-06-30 ENCOUNTER — Other Ambulatory Visit: Payer: Medicare HMO

## 2021-06-30 NOTE — Telephone Encounter (Signed)
TCT patient's wife, Hassan Rowan.  Spoke with her. She states that her husband has been admitted to the in-patient facility in [facility] for end of life care.  He was admitted yesterday. She states he is comfortable and his family has been with him. Asked Hassan Rowan to keep [place] updated on how he is doing.  She told me she will. She expressed how much she and Nori Riis appreciated the care he received here with Dr. Lorenso Courier and myself.  Emotional support extended.

## 2021-07-04 NOTE — Telephone Encounter (Signed)
Noted.GT 

## 2021-07-06 ENCOUNTER — Telehealth: Payer: Self-pay | Admitting: *Deleted

## 2021-07-06 ENCOUNTER — Ambulatory Visit: Admission: RE | Admit: 2021-07-06 | Payer: Medicare HMO | Source: Ambulatory Visit

## 2021-07-06 NOTE — Telephone Encounter (Signed)
Contacted by [facility] with [facility]. Informed of patient death July 11, 2021 at 13 AM. Dr. Lorenso Courier informed - call routed to Dr. Lorenso Courier and support staff.

## 2021-07-06 DEATH — deceased

## 2021-07-12 ENCOUNTER — Ambulatory Visit: Payer: Medicare HMO | Admitting: Podiatry

## 2021-08-05 DEATH — deceased

## 2021-08-31 ENCOUNTER — Ambulatory Visit: Payer: Medicare HMO | Admitting: Internal Medicine

## 2021-09-15 ENCOUNTER — Ambulatory Visit: Payer: Medicare HMO | Admitting: Cardiovascular Disease

## 2022-03-28 ENCOUNTER — Other Ambulatory Visit: Payer: Self-pay

## 2022-07-10 IMAGING — PT NM PET TUM IMG RESTAG (PS) SKULL BASE T - THIGH
1 series · 7 of 7 positions shown · non-contrast
Comparison: PET-CT dated January 21, 2021

CLINICAL DATA: Follow-up treatment strategy for high-grade B-cell
lymphoma.

EXAM:
NUCLEAR MEDICINE PET SKULL BASE TO THIGH
TECHNIQUE: 9.5 mCi F-18 FDG was injected intravenously. Full-ring PET imaging
was performed from the skull base to thigh after the radiotracer. CT
data was obtained and used for attenuation correction and anatomic
localization.
Fasting blood glucose: 124 mg/dl

[Series 1187: results mm oncology reading · 1.0mm · 0.65mm/px · 7 of 7 slices shown]
[im 1/7]
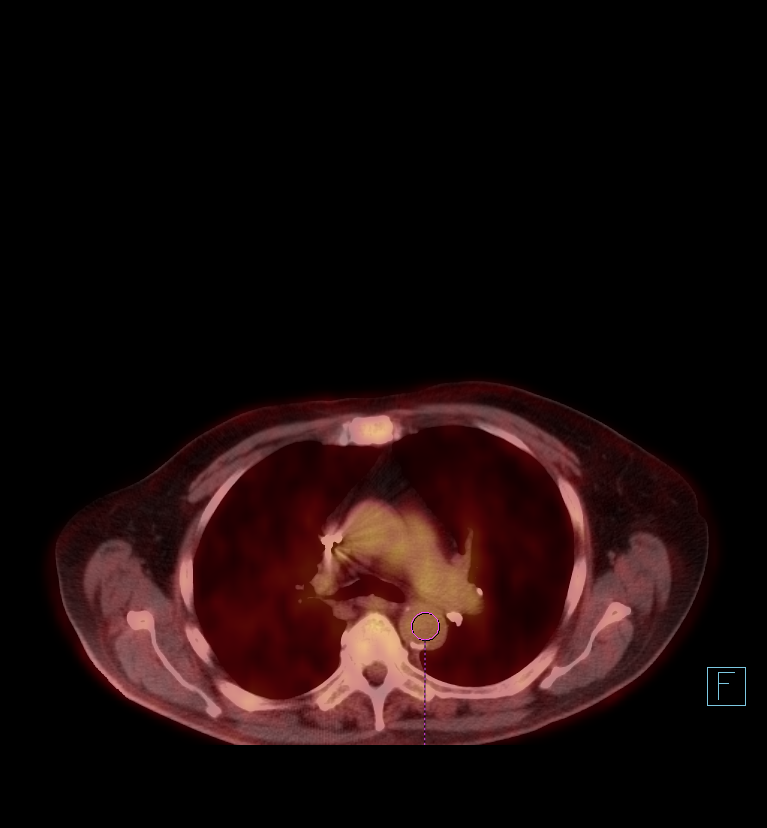
[im 2/7]
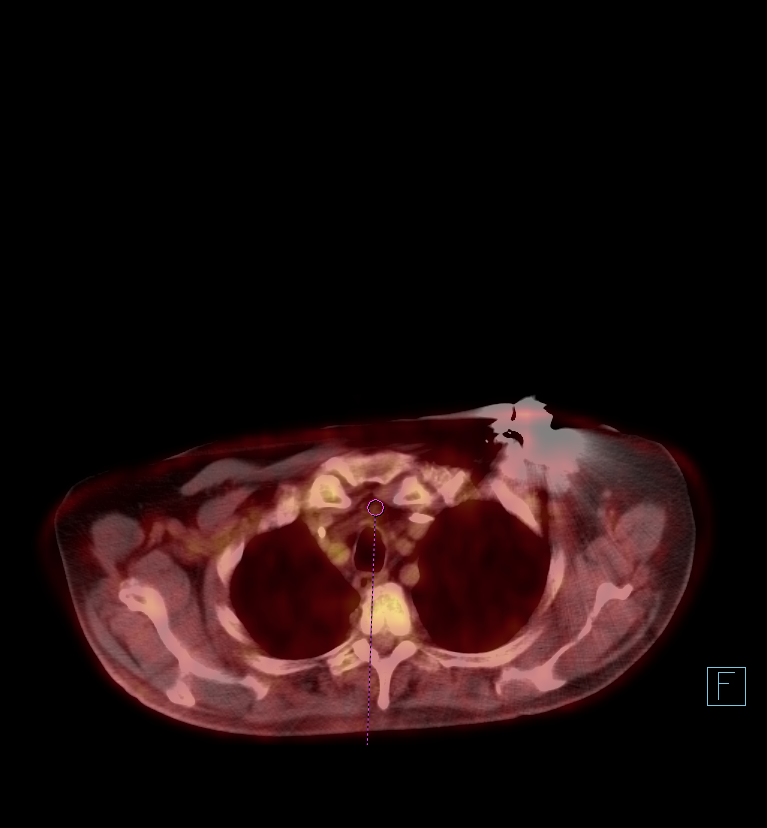
[im 3/7]
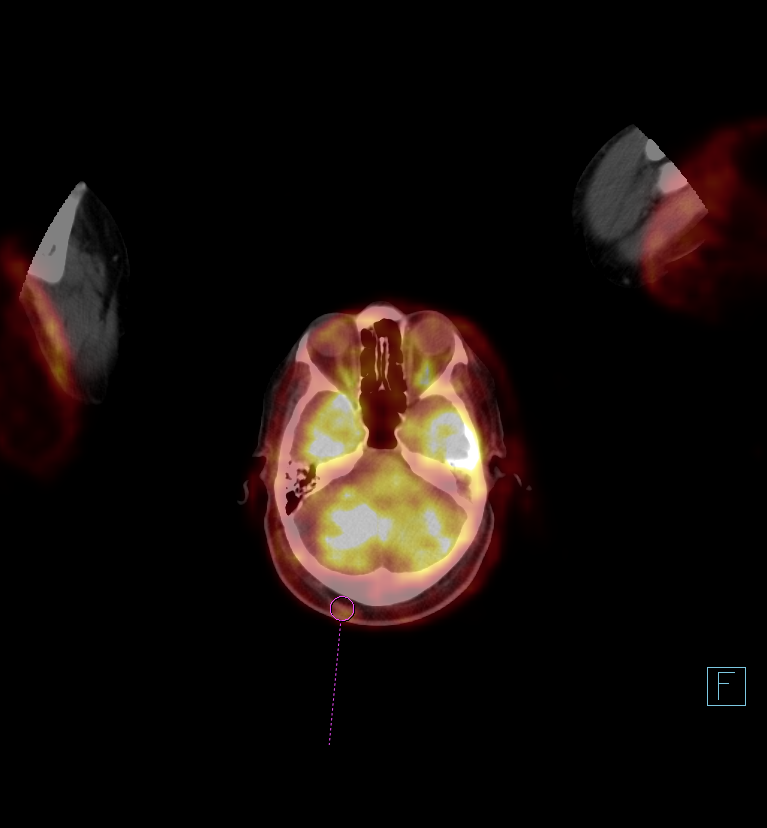
[im 4/7]
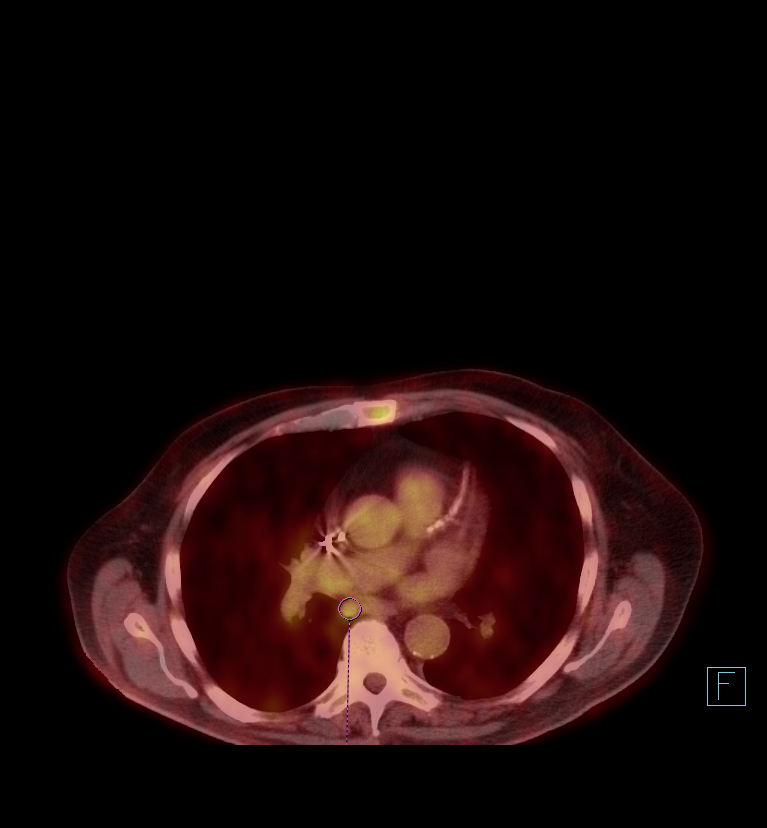
[im 5/7]
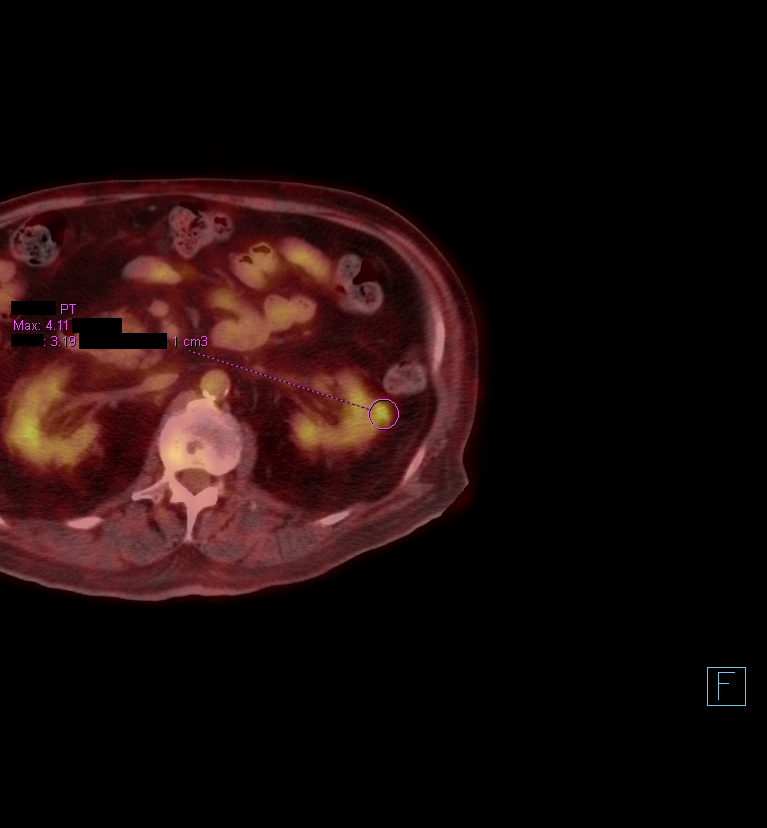
[im 6/7]
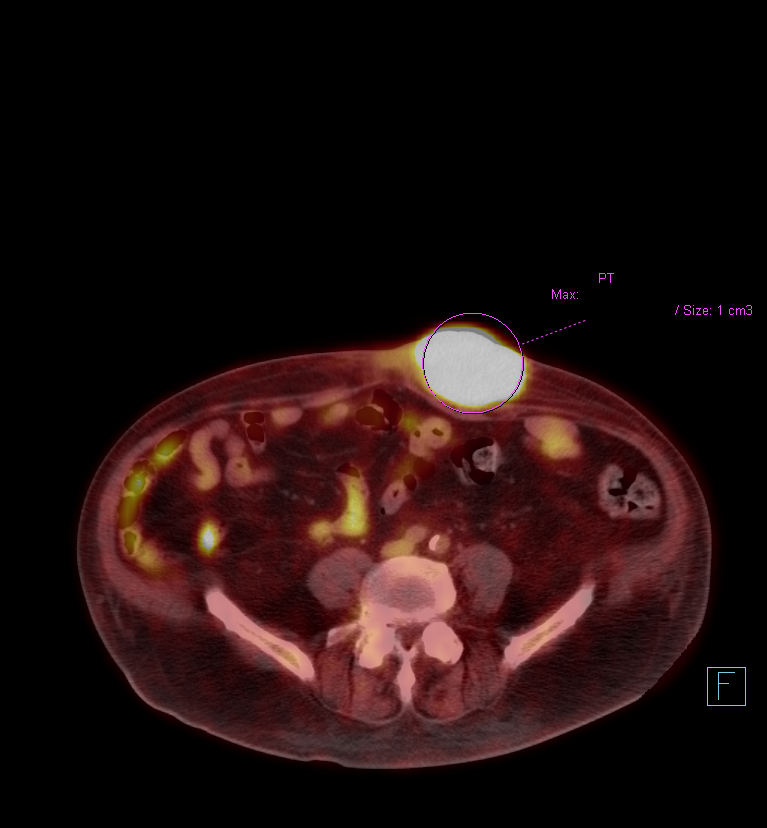
[im 7/7]
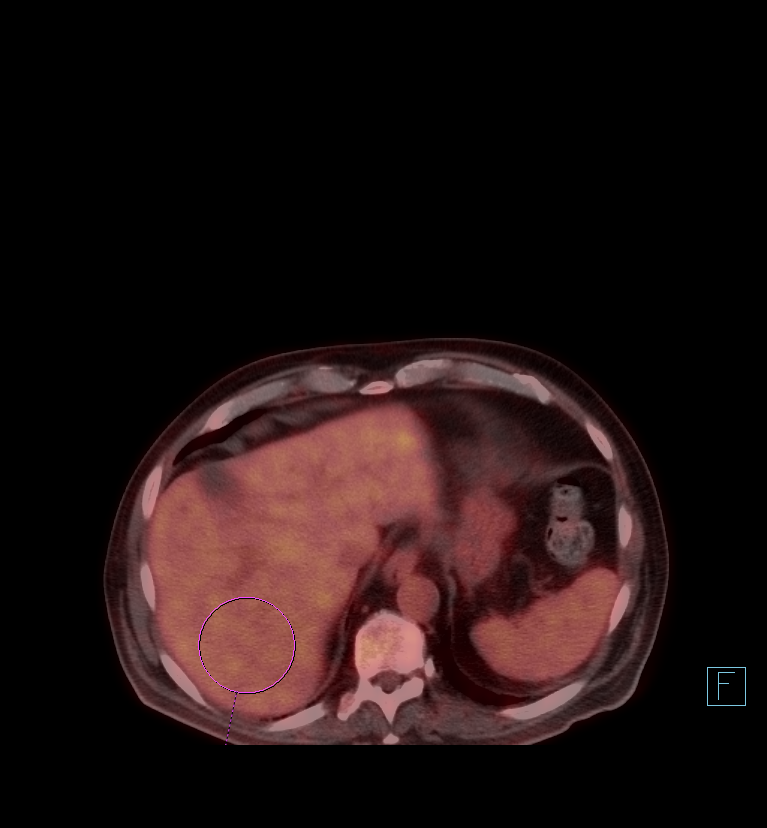

[7 of 7 positions shown; findings below may reference images not displayed]

FINDINGS: Mediastinal blood pool activity: SUV max

Liver activity: SUV max

Head/neck: Interval decreased size of hypermetabolic lesion of the
right posterior scalp now measuring 1.9 x 0.7 cm with an SUV max of
2.8, slightly above blood pool, previously measured 2.5 x 1.2 cm.

Incidental CT findings: none

CHEST: Interval decreased size of paratracheal and prevascular lymph
nodes. Reference prevascular lymph node on series 4, image 53
measuring 3 mm with SUV max below mediastinal blood pool, previously
6 mm. Interval decreased size of subcarinal lymph node measuring 6
mm in short axis on image 71 with an SUV max of 3.4, previously
cm. Previously seen FDG avid solid left upper lobe nodule is no
longer apparent.

Incidental CT findings: Cardiomegaly. Fat deposition and
calcification of the left ventricular apex compatible with prior LAD
infarct. Three-vessel coronary artery calcifications.
Atherosclerotic disease of the thoracic aorta. Left chest wall AICD
with leads in the right atrium and right ventricle. Stable calcified
nodule of the left lower lobe.

ABDOMEN/PELVIS: Interval decreased size of bilateral renal lesions.
Reference lesion along the cortex of the left kidney on image 116
measures 1.3 cm with an SUV max of 4.1, previously 2.3 cm. Interval
decreased size of subcutaneous lesion of the left anterior abdominal
wall measuring 7.0 x 4.3 cm with an SUV max of 33.6, previously
x 6.6 cm.

Incidental CT findings: Prostatomegaly and bladder wall thickening,
unchanged compared to prior exam. Atherosclerotic disease of the
abdominal aorta.

SKELETON: No focal hypermetabolic activity to suggest skeletal
metastasis.

Incidental CT findings: none
IMPRESSION: Mediastinal lymph nodes are decreased in size and demonstrate
decreased FDG uptake.

Soft tissue masses of the left anterior abdominal wall and posterior
right scalp are decreased in size and demonstrate decreased FDG
uptake.

Interval resolution of hypermetabolic pulmonary nodule of the left
upper lobe.

Bilateral renal lesions are decreased in size and demonstrate
decreased FDG uptake.

Aortic Atherosclerosis ([id number]).
